# Patient Record
Sex: Male | Born: 1950 | Race: Black or African American | Hispanic: No | Smoking: Current every day smoker
Health system: Southern US, Community
[De-identification: ages and names within clinical notes are randomized; demographics above are authoritative.]

## PROBLEM LIST (undated history)

## (undated) DIAGNOSIS — Z046 Encounter for general psychiatric examination, requested by authority: Secondary | ICD-10-CM

## (undated) DIAGNOSIS — M1990 Unspecified osteoarthritis, unspecified site: Secondary | ICD-10-CM

## (undated) DIAGNOSIS — K5720 Diverticulitis of large intestine with perforation and abscess without bleeding: Secondary | ICD-10-CM

## (undated) DIAGNOSIS — E291 Testicular hypofunction: Secondary | ICD-10-CM

## (undated) DIAGNOSIS — T50905A Adverse effect of unspecified drugs, medicaments and biological substances, initial encounter: Secondary | ICD-10-CM

## (undated) DIAGNOSIS — D6959 Other secondary thrombocytopenia: Secondary | ICD-10-CM

## (undated) DIAGNOSIS — I10 Essential (primary) hypertension: Secondary | ICD-10-CM

## (undated) DIAGNOSIS — F3181 Bipolar II disorder: Secondary | ICD-10-CM

## (undated) DIAGNOSIS — K922 Gastrointestinal hemorrhage, unspecified: Secondary | ICD-10-CM

## (undated) DIAGNOSIS — E119 Type 2 diabetes mellitus without complications: Secondary | ICD-10-CM

## (undated) DIAGNOSIS — D126 Benign neoplasm of colon, unspecified: Secondary | ICD-10-CM

## (undated) DIAGNOSIS — E785 Hyperlipidemia, unspecified: Secondary | ICD-10-CM

## (undated) HISTORY — DX: Testicular hypofunction: E29.1

## (undated) HISTORY — DX: Unspecified osteoarthritis, unspecified site: M19.90

## (undated) HISTORY — PX: COLONOSCOPY: SHX174

---

## 1998-08-29 ENCOUNTER — Emergency Department (HOSPITAL_COMMUNITY): Admission: EM | Admit: 1998-08-29 | Discharge: 1998-08-29 | Payer: Self-pay | Admitting: Emergency Medicine

## 1998-08-30 ENCOUNTER — Encounter: Payer: Self-pay | Admitting: Emergency Medicine

## 2000-10-06 ENCOUNTER — Emergency Department (HOSPITAL_COMMUNITY): Admission: EM | Admit: 2000-10-06 | Discharge: 2000-10-06 | Payer: Self-pay | Admitting: Emergency Medicine

## 2000-10-06 ENCOUNTER — Encounter: Payer: Self-pay | Admitting: Emergency Medicine

## 2002-03-06 ENCOUNTER — Encounter: Payer: Self-pay | Admitting: Emergency Medicine

## 2002-03-06 ENCOUNTER — Emergency Department (HOSPITAL_COMMUNITY): Admission: EM | Admit: 2002-03-06 | Discharge: 2002-03-06 | Payer: Self-pay | Admitting: Emergency Medicine

## 2003-01-27 ENCOUNTER — Emergency Department (HOSPITAL_COMMUNITY): Admission: EM | Admit: 2003-01-27 | Discharge: 2003-01-27 | Payer: Self-pay | Admitting: Emergency Medicine

## 2003-08-21 DIAGNOSIS — F3181 Bipolar II disorder: Secondary | ICD-10-CM

## 2003-08-21 HISTORY — DX: Bipolar II disorder: F31.81

## 2003-10-03 ENCOUNTER — Emergency Department (HOSPITAL_COMMUNITY): Admission: EM | Admit: 2003-10-03 | Discharge: 2003-10-03 | Payer: Self-pay | Admitting: Emergency Medicine

## 2004-06-06 ENCOUNTER — Emergency Department (HOSPITAL_COMMUNITY): Admission: EM | Admit: 2004-06-06 | Discharge: 2004-06-06 | Payer: Self-pay | Admitting: Emergency Medicine

## 2004-06-22 ENCOUNTER — Ambulatory Visit: Payer: Self-pay | Admitting: Internal Medicine

## 2004-06-22 ENCOUNTER — Inpatient Hospital Stay (HOSPITAL_COMMUNITY): Admission: EM | Admit: 2004-06-22 | Discharge: 2004-06-26 | Payer: Self-pay | Admitting: Emergency Medicine

## 2004-06-23 ENCOUNTER — Encounter: Payer: Self-pay | Admitting: Internal Medicine

## 2004-08-03 ENCOUNTER — Ambulatory Visit: Payer: Self-pay | Admitting: *Deleted

## 2004-08-03 ENCOUNTER — Ambulatory Visit: Payer: Self-pay | Admitting: Family Medicine

## 2004-08-29 ENCOUNTER — Ambulatory Visit: Payer: Self-pay | Admitting: Family Medicine

## 2004-09-20 ENCOUNTER — Ambulatory Visit: Payer: Self-pay | Admitting: Family Medicine

## 2005-04-09 ENCOUNTER — Ambulatory Visit: Payer: Self-pay | Admitting: Family Medicine

## 2005-06-21 ENCOUNTER — Emergency Department (HOSPITAL_COMMUNITY): Admission: EM | Admit: 2005-06-21 | Discharge: 2005-06-21 | Payer: Self-pay | Admitting: *Deleted

## 2005-06-30 ENCOUNTER — Emergency Department (HOSPITAL_COMMUNITY): Admission: EM | Admit: 2005-06-30 | Discharge: 2005-06-30 | Payer: Self-pay | Admitting: Emergency Medicine

## 2006-04-01 ENCOUNTER — Ambulatory Visit: Payer: Self-pay | Admitting: Family Medicine

## 2006-04-29 ENCOUNTER — Emergency Department (HOSPITAL_COMMUNITY): Admission: EM | Admit: 2006-04-29 | Discharge: 2006-04-29 | Payer: Self-pay | Admitting: Emergency Medicine

## 2006-05-16 ENCOUNTER — Ambulatory Visit: Payer: Self-pay | Admitting: Family Medicine

## 2006-07-16 ENCOUNTER — Ambulatory Visit: Payer: Self-pay | Admitting: Family Medicine

## 2006-11-19 ENCOUNTER — Ambulatory Visit: Payer: Self-pay | Admitting: Family Medicine

## 2006-12-18 ENCOUNTER — Ambulatory Visit: Payer: Self-pay | Admitting: Family Medicine

## 2007-02-26 ENCOUNTER — Ambulatory Visit: Payer: Self-pay | Admitting: Internal Medicine

## 2007-08-09 ENCOUNTER — Emergency Department (HOSPITAL_COMMUNITY): Admission: EM | Admit: 2007-08-09 | Discharge: 2007-08-09 | Payer: Self-pay | Admitting: Emergency Medicine

## 2007-10-13 ENCOUNTER — Emergency Department (HOSPITAL_COMMUNITY): Admission: EM | Admit: 2007-10-13 | Discharge: 2007-10-13 | Payer: Self-pay | Admitting: Emergency Medicine

## 2008-01-05 ENCOUNTER — Encounter (INDEPENDENT_AMBULATORY_CARE_PROVIDER_SITE_OTHER): Payer: Self-pay | Admitting: Family Medicine

## 2008-01-05 ENCOUNTER — Ambulatory Visit: Payer: Self-pay | Admitting: Internal Medicine

## 2008-01-05 LAB — CONVERTED CEMR LAB
ALT: 20 units/L (ref 0–53)
AST: 11 units/L (ref 0–37)
Albumin: 4.4 g/dL (ref 3.5–5.2)
Alkaline Phosphatase: 46 units/L (ref 39–117)
BUN: 11 mg/dL (ref 6–23)
CO2: 23 meq/L (ref 19–32)
Calcium: 9.1 mg/dL (ref 8.4–10.5)
Chloride: 105 meq/L (ref 96–112)
Cholesterol: 204 mg/dL — ABNORMAL HIGH (ref 0–200)
Creatinine, Ser: 0.92 mg/dL (ref 0.40–1.50)
Glucose, Bld: 116 mg/dL — ABNORMAL HIGH (ref 70–99)
HDL: 37 mg/dL — ABNORMAL LOW (ref >39)
LDL Cholesterol: 130 mg/dL — ABNORMAL HIGH (ref 0–99)
Potassium: 3.9 meq/L (ref 3.5–5.3)
Sodium: 141 meq/L (ref 135–145)
Total Bilirubin: 0.3 mg/dL (ref 0.3–1.2)
Total CHOL/HDL Ratio: 5.5
Total Protein: 8.2 g/dL (ref 6.0–8.3)
Triglycerides: 186 mg/dL — ABNORMAL HIGH (ref <150)
VLDL: 37 mg/dL (ref 0–40)

## 2008-01-20 ENCOUNTER — Encounter: Payer: Self-pay | Admitting: Internal Medicine

## 2008-02-10 ENCOUNTER — Ambulatory Visit: Payer: Self-pay | Admitting: Internal Medicine

## 2008-02-12 ENCOUNTER — Ambulatory Visit: Payer: Self-pay | Admitting: Internal Medicine

## 2008-02-26 ENCOUNTER — Ambulatory Visit: Payer: Self-pay | Admitting: Internal Medicine

## 2008-02-26 ENCOUNTER — Encounter: Payer: Self-pay | Admitting: Internal Medicine

## 2008-03-01 ENCOUNTER — Encounter: Payer: Self-pay | Admitting: Internal Medicine

## 2008-04-29 ENCOUNTER — Emergency Department (HOSPITAL_COMMUNITY): Admission: EM | Admit: 2008-04-29 | Discharge: 2008-04-29 | Payer: Self-pay | Admitting: Emergency Medicine

## 2009-01-17 ENCOUNTER — Emergency Department (HOSPITAL_COMMUNITY): Admission: EM | Admit: 2009-01-17 | Discharge: 2009-01-17 | Payer: Self-pay | Admitting: Emergency Medicine

## 2009-02-15 ENCOUNTER — Encounter: Payer: Self-pay | Admitting: Family Medicine

## 2009-02-15 ENCOUNTER — Encounter (INDEPENDENT_AMBULATORY_CARE_PROVIDER_SITE_OTHER): Payer: Self-pay | Admitting: Adult Health

## 2009-02-15 ENCOUNTER — Ambulatory Visit: Payer: Self-pay | Admitting: Internal Medicine

## 2009-02-15 LAB — CONVERTED CEMR LAB
ALT: 9 units/L (ref 0–53)
AST: 6 units/L (ref 0–37)
Albumin: 4.1 g/dL (ref 3.5–5.2)
Alkaline Phosphatase: 41 units/L (ref 39–117)
BUN: 8 mg/dL (ref 6–23)
Basophils Absolute: 0 10*3/uL (ref 0.0–0.1)
Basophils Relative: 0 % (ref 0–1)
CO2: 21 meq/L (ref 19–32)
Calcium: 8.9 mg/dL (ref 8.4–10.5)
Chloride: 106 meq/L (ref 96–112)
Cholesterol: 163 mg/dL (ref 0–200)
Creatinine, Ser: 0.88 mg/dL (ref 0.40–1.50)
Eosinophils Absolute: 0.1 10*3/uL (ref 0.0–0.7)
Eosinophils Relative: 1 % (ref 0–5)
Glucose, Bld: 84 mg/dL (ref 70–99)
HCT: 39.2 % (ref 39.0–52.0)
HDL: 36 mg/dL — ABNORMAL LOW (ref >39)
Hemoglobin: 12.9 g/dL — ABNORMAL LOW (ref 13.0–17.0)
Hgb A1c MFr Bld: 5.8 % (ref 4.6–6.1)
LDL Cholesterol: 105 mg/dL — ABNORMAL HIGH (ref 0–99)
Lymphocytes Relative: 51 % — ABNORMAL HIGH (ref 12–46)
Lymphs Abs: 3.4 10*3/uL (ref 0.7–4.0)
MCHC: 32.9 g/dL (ref 30.0–36.0)
MCV: 88.3 fL (ref 78.0–100.0)
Microalb, Ur: 2.58 mg/dL — ABNORMAL HIGH (ref 0.00–1.89)
Monocytes Absolute: 0.8 10*3/uL (ref 0.1–1.0)
Monocytes Relative: 12 % (ref 3–12)
Neutro Abs: 2.3 10*3/uL (ref 1.7–7.7)
Neutrophils Relative %: 35 % — ABNORMAL LOW (ref 43–77)
PSA: 2.39 ng/mL (ref 0.10–4.00)
Platelets: 151 10*3/uL (ref 150–400)
Potassium: 4 meq/L (ref 3.5–5.3)
RBC: 4.44 M/uL (ref 4.22–5.81)
RDW: 14.9 % (ref 11.5–15.5)
Sodium: 140 meq/L (ref 135–145)
Total Bilirubin: 0.4 mg/dL (ref 0.3–1.2)
Total CHOL/HDL Ratio: 4.5
Total Protein: 7.5 g/dL (ref 6.0–8.3)
Triglycerides: 109 mg/dL (ref <150)
VLDL: 22 mg/dL (ref 0–40)
Valproic Acid Lvl: 83.8 ug/mL (ref 50.0–100.0)
WBC: 6.6 10*3/uL (ref 4.0–10.5)

## 2009-03-21 ENCOUNTER — Ambulatory Visit: Payer: Self-pay | Admitting: Internal Medicine

## 2009-06-01 ENCOUNTER — Ambulatory Visit: Payer: Self-pay | Admitting: Internal Medicine

## 2009-08-05 ENCOUNTER — Emergency Department (HOSPITAL_COMMUNITY): Admission: EM | Admit: 2009-08-05 | Discharge: 2009-08-05 | Payer: Self-pay | Admitting: Emergency Medicine

## 2009-08-09 ENCOUNTER — Encounter (INDEPENDENT_AMBULATORY_CARE_PROVIDER_SITE_OTHER): Payer: Self-pay | Admitting: Adult Health

## 2009-08-09 ENCOUNTER — Ambulatory Visit: Payer: Self-pay | Admitting: Internal Medicine

## 2009-08-09 LAB — CONVERTED CEMR LAB
ALT: 13 units/L (ref 0–53)
AST: 8 units/L (ref 0–37)
Albumin: 3.8 g/dL (ref 3.5–5.2)
Alkaline Phosphatase: 41 units/L (ref 39–117)
BUN: 11 mg/dL (ref 6–23)
CO2: 20 meq/L (ref 19–32)
Calcium: 8.8 mg/dL (ref 8.4–10.5)
Chloride: 105 meq/L (ref 96–112)
Cholesterol: 121 mg/dL (ref 0–200)
Creatinine, Ser: 1.03 mg/dL (ref 0.40–1.50)
Glucose, Bld: 106 mg/dL — ABNORMAL HIGH (ref 70–99)
HDL: 25 mg/dL — ABNORMAL LOW (ref >39)
LDL Cholesterol: 74 mg/dL (ref 0–99)
Potassium: 3.9 meq/L (ref 3.5–5.3)
Sodium: 140 meq/L (ref 135–145)
Total Bilirubin: 0.3 mg/dL (ref 0.3–1.2)
Total CHOL/HDL Ratio: 4.8
Total Protein: 7.6 g/dL (ref 6.0–8.3)
Triglycerides: 108 mg/dL (ref <150)
VLDL: 22 mg/dL (ref 0–40)

## 2009-09-20 ENCOUNTER — Encounter (INDEPENDENT_AMBULATORY_CARE_PROVIDER_SITE_OTHER): Payer: Self-pay | Admitting: Adult Health

## 2009-09-20 ENCOUNTER — Ambulatory Visit: Payer: Self-pay | Admitting: Internal Medicine

## 2009-09-20 LAB — CONVERTED CEMR LAB: Hgb A1c MFr Bld: 6.1 % (ref 4.6–6.1)

## 2009-09-26 ENCOUNTER — Emergency Department (HOSPITAL_COMMUNITY): Admission: EM | Admit: 2009-09-26 | Discharge: 2009-09-26 | Payer: Self-pay | Admitting: Emergency Medicine

## 2010-01-06 ENCOUNTER — Ambulatory Visit: Payer: Self-pay | Admitting: Internal Medicine

## 2010-01-06 ENCOUNTER — Encounter (INDEPENDENT_AMBULATORY_CARE_PROVIDER_SITE_OTHER): Payer: Self-pay | Admitting: Adult Health

## 2010-01-06 LAB — CONVERTED CEMR LAB
ALT: 9 units/L (ref 0–53)
AST: 6 units/L (ref 0–37)
Albumin: 4.4 g/dL (ref 3.5–5.2)
Alkaline Phosphatase: 41 units/L (ref 39–117)
BUN: 10 mg/dL (ref 6–23)
Basophils Absolute: 0 10*3/uL (ref 0.0–0.1)
Basophils Relative: 0 % (ref 0–1)
CO2: 22 meq/L (ref 19–32)
Calcium: 9.7 mg/dL (ref 8.4–10.5)
Chloride: 109 meq/L (ref 96–112)
Cholesterol: 167 mg/dL (ref 0–200)
Creatinine, Ser: 0.94 mg/dL (ref 0.40–1.50)
Eosinophils Absolute: 0.1 10*3/uL (ref 0.0–0.7)
Eosinophils Relative: 1 % (ref 0–5)
Glucose, Bld: 98 mg/dL (ref 70–99)
HCT: 39.9 % (ref 39.0–52.0)
HDL: 36 mg/dL — ABNORMAL LOW (ref >39)
Hemoglobin: 13 g/dL (ref 13.0–17.0)
LDL Cholesterol: 111 mg/dL — ABNORMAL HIGH (ref 0–99)
Lymphocytes Relative: 60 % — ABNORMAL HIGH (ref 12–46)
Lymphs Abs: 3.8 10*3/uL (ref 0.7–4.0)
MCHC: 32.6 g/dL (ref 30.0–36.0)
MCV: 86.2 fL (ref 78.0–100.0)
Microalb, Ur: 2.49 mg/dL — ABNORMAL HIGH (ref 0.00–1.89)
Monocytes Absolute: 0.7 10*3/uL (ref 0.1–1.0)
Monocytes Relative: 10 % (ref 3–12)
Neutro Abs: 1.9 10*3/uL (ref 1.7–7.7)
Neutrophils Relative %: 29 % — ABNORMAL LOW (ref 43–77)
Platelets: 144 10*3/uL — ABNORMAL LOW (ref 150–400)
Potassium: 4.4 meq/L (ref 3.5–5.3)
RBC: 4.63 M/uL (ref 4.22–5.81)
RDW: 14.8 % (ref 11.5–15.5)
Sodium: 140 meq/L (ref 135–145)
Total Bilirubin: 0.3 mg/dL (ref 0.3–1.2)
Total CHOL/HDL Ratio: 4.6
Total Protein: 8 g/dL (ref 6.0–8.3)
Triglycerides: 102 mg/dL (ref <150)
VLDL: 20 mg/dL (ref 0–40)
Valproic Acid Lvl: 81.2 ug/mL (ref 50.0–100.0)
WBC: 6.4 10*3/uL (ref 4.0–10.5)

## 2010-01-19 ENCOUNTER — Ambulatory Visit: Payer: Self-pay | Admitting: Internal Medicine

## 2010-07-03 ENCOUNTER — Encounter (INDEPENDENT_AMBULATORY_CARE_PROVIDER_SITE_OTHER): Payer: Self-pay | Admitting: *Deleted

## 2010-07-03 LAB — CONVERTED CEMR LAB
ALT: 9 units/L (ref 0–53)
AST: 5 units/L (ref 0–37)
Albumin: 4.4 g/dL (ref 3.5–5.2)
Alkaline Phosphatase: 42 units/L (ref 39–117)
BUN: 15 mg/dL (ref 6–23)
CO2: 23 meq/L (ref 19–32)
Calcium: 9 mg/dL (ref 8.4–10.5)
Chloride: 104 meq/L (ref 96–112)
Creatinine, Ser: 1 mg/dL (ref 0.40–1.50)
Glucose, Bld: 103 mg/dL — ABNORMAL HIGH (ref 70–99)
Potassium: 4.2 meq/L (ref 3.5–5.3)
Sodium: 139 meq/L (ref 135–145)
Total Bilirubin: 0.2 mg/dL — ABNORMAL LOW (ref 0.3–1.2)
Total Protein: 8 g/dL (ref 6.0–8.3)

## 2010-08-11 ENCOUNTER — Emergency Department (HOSPITAL_COMMUNITY)
Admission: EM | Admit: 2010-08-11 | Discharge: 2010-08-11 | Payer: Self-pay | Source: Home / Self Care | Admitting: Emergency Medicine

## 2010-11-20 LAB — URINALYSIS, ROUTINE W REFLEX MICROSCOPIC
Glucose, UA: NEGATIVE mg/dL
Hgb urine dipstick: NEGATIVE
Ketones, ur: 15 mg/dL — AB
Nitrite: POSITIVE — AB
Protein, ur: 30 mg/dL — AB
Specific Gravity, Urine: 1.026 (ref 1.005–1.030)
Urobilinogen, UA: 2 mg/dL — ABNORMAL HIGH (ref 0.0–1.0)
pH: 6 (ref 5.0–8.0)

## 2010-11-20 LAB — URINE CULTURE: Colony Count: >=100000

## 2010-11-20 LAB — URINE MICROSCOPIC-ADD ON

## 2011-01-05 NOTE — Discharge Summary (Signed)
Bradley [name], MILLEY NO.:  [id number]   MEDICAL RECORD NO.:  [id number]          PATIENT TYPE:  INP   LOCATION:  4706                         FACILITY:  [facility]   PHYSICIAN:  Artist Beach, MD        DATE OF BIRTH:  [birth year]   DATE OF ADMISSION:  06/22/2004  DATE OF DISCHARGE:  06/26/2004                                 DISCHARGE SUMMARY   DISCHARGE DIAGNOSES:  1.  Idiopathic syncope.  2.  Hypertension.  3.  Schizophrenia.  4.  Bipolar disorder.   DISCHARGE MEDICATIONS:  1.  Lotensin 20 mg b.i.d.  2.  Hydrochlorothiazide 25 mg daily.  3.  Cogentin 1 mg b.i.d.  4.  Risperdal 2 mg b.i.d.  5.  Depakote 500 mg daily.  6.  The patient is discharged on his home medications.   FOLLOW UP:  He is to follow up at [facility]  Also, the patient will  follow up with Dr. Marney Setting.   PROCEDURE PERFORMED:  1.  The patient got an EKG on June 22, 2004, which showed normal sinus      rhythm, large P-wave in lead II and V1, left atrial enlargement, ST      changes in II, III, aVF and V4, V5 and 6.  The lateral lead changes were      unchanged from previous EKG from February 2003, but inferior lead      changes were new.  Tall QRS in lead V showed left ventricular      hypertrophy.  2.  The patient got a 2-D echo on June 23, 2004, showing left atrial      enlargement, ejection fraction of 55%, rest normal.  3.  The patient got a Cardiolite on June 25, 2004, which was negative for      ST changes, negative for dysrhythmia.   CONSULTATIONS:  The patient did not get any consults.   Bradley [name] is a 60 year old African American male patient who was brought  to the ED via EMS after passing out on the morning of admission.  The  patient was sitting in court in the morning when he experienced some  dizziness.  He stood up to walk out of the court where he was to call off  some case that he was there for.  He felt sweaty and clammy at that time,  did not remember  passing out.  The only thing he remembered was when he  opened his eyes and was surrounded by police officers.  He said he was out  for less than three minutes, does not remember experiencing any chest pain,  shortness of breath, visual disturbances, ringing in the ears.   LABORATORY DATA ON ADMISSION:  Sodium 137, potassium 3.9, chloride 106,  bicarb 25, BUN 15, creatinine 1.0, glucose 93.  Alcohol level less than 5.  Valproic acid level was 69.  Urine drug screen was negative.  TSH level was  0.707.  Three sets of cardiac enzymes were negative.  Hemoglobin 16,  hematocrit 47.  Normal liver function tests.   HOSPITAL COURSE:  PROBLEM #1 -  SYNCOPE:  The patient has had similar  episode two years ago.  He was diagnosed with cardiogenic syncope at that  time but no work-up was done.  During this admission, the differential  diagnosis was cardiogenic versus orthostatic versus hypoglycemic syncope.  The patient's glucose on admission was 93 and by EMS at the site of passing  out, was 144, so hypoglycemia was ruled out.   PROBLEM #2 -  ORTHOSTATIC HYPERTENSION:  The patient is on a couple of  medications which would have caused hypertension also.  His medicines were  recently changed.  He is on hydrochlorothiazide and Lotensin for his  hypertension plus his Lithium was changed and he was put on valproic acid  and he has been taking Respirol which is known rarely to cause dizziness and  orthostatic hypertension but on admission,  Orthostatics were lying down  138/77 with pulse 60, sitting up 131/71 with pulse 61, and standing 128/75  with pulse 71.  The patient was not orthostatic.  From the EKG, it was found  that the patient had lateral and inferior lead ischemic changes so this was  investigated further with echo and Cardiolite as mentioned reports earlier.  No significant cause was found so on discharge, this was labeled as  idiopathic syncope and  discharged on same medication.    PROBLEM #3 -  HYPERTENSION:  The patient is taking Lotensin 20 mg and  hydrochlorothiazide 25 mg daily.   PROBLEM #4 -  SCHIZOPHRENIA/BIPOLAR DISORDER:  The patient is on Cogentin 1  mg b.i.d., Risperdol 2 mg b.i.d. and __________ 500 mg daily.  He is  followed up at [facility] and by Dr. Marney Setting.  Further he is also a patient  at [facility].   There were no labs on discharge.   PHYSICAL EXAMINATION:  GENERAL APPEARANCE:  Fairly built [place] American  male patient in no acute distress.  No facial droop, no slurred speech.  VITAL SIGNS:  On admission, temperature 97.9, pulse 62, respiratory rate 18,  blood pressure 133/73, saturating 94% on room air.  HEENT:  Pupils round and reactive to light.  Extraocular movements intact.  ENT examination normal tympanic membranes, no thyromegaly, no JVD, no bruit.  NECK:  Supple.  LUNGS:  Air entry bilaterally and equal.  No rales or rhonchi.  CARDIOVASCULAR:  Regular rate and rhythm with no murmurs, rubs, or gallops.  ABDOMEN:  Soft and nontender, nondistended with normal bowel sounds.  NEUROLOGIC:  Grossly nonfocal.  PSYCHIATRIC:  No delusions, hallucinations, suicidal ideation, oriented x3.       SP/MEDQ  D:  06/26/2004  T:  06/26/2004  Job:  [id number]   cc:   [address]ad[address]   Dr. Marney Setting,

## 2011-01-05 NOTE — Discharge Summary (Signed)
NAMEDIEM, PAGNOTTA NO.:  [id number]   MEDICAL RECORD NO.:  [id number]          PATIENT TYPE:  INP   LOCATION:  4706                         FACILITY:  [facility]   PHYSICIAN:  Artist Beach, MD        DATE OF BIRTH:  1951-02-21   DATE OF ADMISSION:  06/22/2004  DATE OF DISCHARGE:  06/26/2004                                 DISCHARGE SUMMARY   ADDENDUM:  Mr. Phoebe Sharps had Cardiolite on November 6 which initially was  negative for ST changes, but the films were pending.  When the films were  done it showed abnormal EKG for ischemia.  The patient is asked to follow up  at [facility] for further management.  I will be sending a letter to  Mr. Ikegwouno.  I have tried calling up at the number, but it seems the  patient does not live there.  We will send a copy of this letter to his  place.       SP/MEDQ  D:  07/16/2004  T:  07/16/2004  Job:  [id number]

## 2011-08-23 DIAGNOSIS — E119 Type 2 diabetes mellitus without complications: Secondary | ICD-10-CM | POA: Insufficient documentation

## 2011-10-20 ENCOUNTER — Encounter (HOSPITAL_COMMUNITY): Payer: Self-pay | Admitting: Emergency Medicine

## 2011-10-20 ENCOUNTER — Emergency Department (HOSPITAL_COMMUNITY)
Admission: EM | Admit: 2011-10-20 | Discharge: 2011-10-21 | Disposition: A | Discharge status: Home / Self Care | Payer: Medicare Other | Attending: Emergency Medicine | Admitting: Emergency Medicine

## 2011-10-20 DIAGNOSIS — F319 Bipolar disorder, unspecified: Secondary | ICD-10-CM | POA: Insufficient documentation

## 2011-10-20 DIAGNOSIS — F3181 Bipolar II disorder: Secondary | ICD-10-CM | POA: Insufficient documentation

## 2011-10-20 DIAGNOSIS — K0889 Other specified disorders of teeth and supporting structures: Secondary | ICD-10-CM

## 2011-10-20 DIAGNOSIS — K089 Disorder of teeth and supporting structures, unspecified: Secondary | ICD-10-CM | POA: Insufficient documentation

## 2011-10-20 DIAGNOSIS — E785 Hyperlipidemia, unspecified: Secondary | ICD-10-CM | POA: Insufficient documentation

## 2011-10-20 DIAGNOSIS — I10 Essential (primary) hypertension: Secondary | ICD-10-CM | POA: Insufficient documentation

## 2011-10-20 HISTORY — DX: Hyperlipidemia, unspecified: E78.5

## 2011-10-20 HISTORY — DX: Bipolar II disorder: F31.81

## 2011-10-20 HISTORY — DX: Essential (primary) hypertension: I10

## 2011-10-20 NOTE — ED Notes (Signed)
Pt states toothache started today. Pt states that his top back tooth is hurting. Pt denies any swelling. Pt has a blacked upper tooth. Pt states painful to chew.

## 2011-10-20 NOTE — ED Notes (Signed)
Patient complaining of a toothache (right upper); patient states that his tooth cracked and part fell out last month.  Patient states that he needs medication to last him until he can get to a dentist.

## 2011-10-21 MED ORDER — HYDROCODONE-ACETAMINOPHEN 5-325 MG PO TABS: 1.0000 | ORAL_TABLET | Freq: Four times a day (QID) | ORAL | Status: AC | PRN

## 2011-10-21 MED ORDER — OXYCODONE-ACETAMINOPHEN 5-325 MG PO TABS
2.0000 | ORAL_TABLET | Freq: Once | ORAL | Status: AC
  Administered 2011-10-21: 2 via ORAL
  Filled 2011-10-21: qty 2

## 2011-10-21 NOTE — Discharge Instructions (Signed)
You have a dental injury. Use the resource guide listed below to help you find a dentist if you do not already have one to followup with. It is very important that you get evaluated by a dentist as soon as possible. Call tomorrow to schedule an appointment. Use your pain medication as prescribed and do not operate heavy machinery while on pain medication. Note that your pain medication contains acetaminophen (Tylenol) & its is not reccommended that you use additional acetaminophen (Tylenol) while taking this medication. Take your full course of antibiotics. Read the instructions below.  Eat a soft or liquid diet and rinse your mouth out after meals with warm water. You should see a dentist or return here at once if you have increased swelling, increased pain or uncontrolled bleeding from the site of your injury.   SEEK MEDICAL CARE IF:   You have increased pain not controlled with medicines.   You have swelling around your tooth, in your face or neck.   You have bleeding which starts, continues, or gets worse.   You have a fever >101  If you are unable to open your mouth  RESOURCE GUIDE  Dental Problems  Patients with Medicaid: [facility]                     [facility] [address]                                           [address] Phone:  [phone]                                                  Phone:  [phone]  If unable to pay or uninsured, contact:  [facility] or [facility] to become qualified for the adult dental clinic.  Chronic Pain Problems Contact [facility]  [phone] Patients need to be referred by their primary care doctor.  Insufficient Money for Medicine Contact [employer]:  call "211" or [facility] 271-5999.  No Primary Care Doctor Call [facility]  832-8000 Other agencies that provide inexpensive medical care    [facility]  832-8035    [place]  [facility]  832-7272    [facility]  [phone]    Women's Clinic  832-4777    [facility]  [phone]    [facility]  [phone]  Psychological Services [facility]  [phone] [facility]  [phone] [facility]   800 853-5163 (emergency services 641-4993)  Substance Abuse Resources [facility]  336-882-2125 [facility] 336-784-9470 [facility] 336-285-9073 [facility] 336-845-3988 Residential & Outpatient Substance Abuse Program  800-659-3381  Abuse/Neglect [place] Child Abuse Hotline (336) 641-3795 [place] Child Abuse Hotline 800-378-5315 (After Hours)  Emergency Shelter [facility] (336) 271-5985  [facility] (336) 275-9566 [facility] (704) 372-4663  MRSA Hotline #:   832-7006    [place] Resources  [facility] [place]     [employer]                            [facility] [address] [place]                       [address]      [address]  [place]                                                [place]                            [place] Phone:  [phone]                                   Phone:  [phone]                 Phone:  [phone]  [facility] Phone:  [phone]  [place] Child Abuse Hotline (336) 342-1394 (336) 342-3537 (After Hours)    

## 2011-10-21 NOTE — ED Provider Notes (Signed)
History     CSN: [id number]  Arrival date & time 10/20/11  2251   First MD Initiated Contact with Patient 10/20/11 2341      Chief Complaint  Patient presents with  . Dental Pain    (Consider location/radiation/quality/duration/timing/severity/associated sxs/prior treatment) HPI Comments: Patient presents to the emergency department with a dental complaint. Symptoms began yesterday. The patient has tried to alleviate pain with nothing.  Pain rated at a 10/10, characterized as throbbing in nature and located right upper mouth. Patient denies fever, night sweats, chills, difficulty swallowing or opening mouth, SOB, nuchal rigidity or decreased ROM of neck.  Patient does not have a dentist and requests a resource guide at discharge.   Patient is a 61 y.o. male presenting with tooth pain. The history is provided by the patient.  Dental PainPrimary symptoms do not include headaches, fever, shortness of breath or sore throat.  Additional symptoms do not include: facial swelling, trouble swallowing, drooling and ear pain.    Past Medical History  Diagnosis Date  . Hypertension   . Hyperlipemia   . Bipolar 2 disorder     History reviewed. No pertinent past surgical history.  History reviewed. No pertinent family history.  History  Substance Use Topics  . Smoking status: Current Everyday Smoker -- 1.0 packs/day  . Smokeless tobacco: Not on file  . Alcohol Use: No      Review of Systems  Constitutional: Negative for fever, chills, diaphoresis and activity change.  HENT: Positive for dental problem. Negative for ear pain, sore throat, facial swelling, drooling, mouth sores, trouble swallowing, neck pain, neck stiffness, voice change, sinus pressure and tinnitus.   Eyes: Negative for pain and visual disturbance.  Respiratory: Negative for shortness of breath, wheezing and stridor.   Cardiovascular: Negative for chest pain.  Gastrointestinal: Negative for nausea and abdominal pain.   Musculoskeletal: Negative for myalgias.  Skin: Negative for rash.  Neurological: Negative for speech difficulty and headaches.  Hematological: Negative for adenopathy.  All other systems reviewed and are negative.    Allergies  Review of patient's allergies indicates no known allergies.  Home Medications   Current Outpatient Rx  Name Route Sig Dispense Refill  . HYDROCODONE-ACETAMINOPHEN 5-325 MG PO TABS Oral Take 1 tablet by mouth every 6 (six) hours as needed for pain. 15 tablet 0    BP 173/95  Temp(Src) 98.7 F (37.1 C) (Oral)  Resp 18  SpO2 97%  Physical Exam  Nursing note and vitals reviewed. Constitutional: He is oriented to person, place, and time. He appears well-developed and well-nourished. No distress.  HENT:  Head: Normocephalic and atraumatic. No trismus in the jaw.  Mouth/Throat: Uvula is midline, oropharynx is clear and moist and mucous membranes are normal. Abnormal dentition. No dental abscesses or uvula swelling. No oropharyngeal exudate, posterior oropharyngeal edema, posterior oropharyngeal erythema or tonsillar abscesses.       Poor dental hygiene. Pt able to open and close mouth with out difficulty. Airway intact. Uvula midline. Mild gingival tenderness over affected area, but no fluctuance. No new dental trauma or exposed dentin or pulp. No swelling or tenderness of submental and submandibular regions.  Eyes: Conjunctivae and EOM are normal.  Neck: Normal range of motion and full passive range of motion without pain. Neck supple.  Cardiovascular: Normal rate and regular rhythm.   Pulmonary/Chest: Effort normal and breath sounds normal. No stridor. No respiratory distress. He has no wheezes.  Musculoskeletal: Normal range of motion.  Lymphadenopathy:  Head (right side): No submental, no submandibular, no tonsillar, no preauricular and no posterior auricular adenopathy present.       Head (left side): No submental, no submandibular, no tonsillar, no  preauricular and no posterior auricular adenopathy present.    He has no cervical adenopathy.  Neurological: He is alert and oriented to person, place, and time.  Skin: Skin is warm and dry. No rash noted. He is not diaphoretic.    ED Course  Procedures (including critical care time)  Labs Reviewed - No data to display No results found.   No diagnosis found.    MDM  Dental pain  Patient with toothache.  No gross abscess.  Exam unconcerning for Ludwig's angina or spread of infection.  Will treat with pain medicine.  Urged patient to follow-up with dentist.  Antibiotics Not indicated no evidence of infection or risk of infection.         Jaci Carrel, [place] 10/21/11 [phone]

## 2011-10-21 NOTE — ED Provider Notes (Signed)
Medical screening examination/treatment/procedure(s) were performed by non-physician practitioner and as supervising physician I was immediately available for consultation/collaboration.  Aseret Hoffman, MD 10/21/11 1637 

## 2011-11-13 ENCOUNTER — Emergency Department (HOSPITAL_COMMUNITY): Payer: Medicare Other

## 2011-11-13 ENCOUNTER — Encounter (HOSPITAL_COMMUNITY): Payer: Self-pay | Admitting: *Deleted

## 2011-11-13 ENCOUNTER — Emergency Department (HOSPITAL_COMMUNITY)
Admission: EM | Admit: 2011-11-13 | Discharge: 2011-11-13 | Disposition: A | Discharge status: Home / Self Care | Payer: Medicare Other | Attending: Emergency Medicine | Admitting: Emergency Medicine

## 2011-11-13 DIAGNOSIS — Z79899 Other long term (current) drug therapy: Secondary | ICD-10-CM | POA: Insufficient documentation

## 2011-11-13 DIAGNOSIS — N201 Calculus of ureter: Secondary | ICD-10-CM | POA: Insufficient documentation

## 2011-11-13 DIAGNOSIS — I10 Essential (primary) hypertension: Secondary | ICD-10-CM | POA: Insufficient documentation

## 2011-11-13 DIAGNOSIS — F3189 Other bipolar disorder: Secondary | ICD-10-CM | POA: Insufficient documentation

## 2011-11-13 DIAGNOSIS — N1330 Unspecified hydronephrosis: Secondary | ICD-10-CM | POA: Insufficient documentation

## 2011-11-13 DIAGNOSIS — N132 Hydronephrosis with renal and ureteral calculous obstruction: Secondary | ICD-10-CM

## 2011-11-13 DIAGNOSIS — E785 Hyperlipidemia, unspecified: Secondary | ICD-10-CM | POA: Insufficient documentation

## 2011-11-13 DIAGNOSIS — R1032 Left lower quadrant pain: Secondary | ICD-10-CM | POA: Insufficient documentation

## 2011-11-13 LAB — DIFFERENTIAL
Basophils Absolute: 0 10*3/uL (ref 0.0–0.1)
Basophils Relative: 0 % (ref 0–1)
Eosinophils Absolute: 0.1 10*3/uL (ref 0.0–0.7)
Eosinophils Relative: 1 % (ref 0–5)
Lymphocytes Relative: 35 % (ref 12–46)
Lymphs Abs: 2.8 10*3/uL (ref 0.7–4.0)
Monocytes Absolute: 0.7 10*3/uL (ref 0.1–1.0)
Monocytes Relative: 9 % (ref 3–12)
Neutro Abs: 4.4 10*3/uL (ref 1.7–7.7)
Neutrophils Relative %: 55 % (ref 43–77)

## 2011-11-13 LAB — CBC
HCT: 39.4 % (ref 39.0–52.0)
Hemoglobin: 13.3 g/dL (ref 13.0–17.0)
MCH: 28.5 pg (ref 26.0–34.0)
MCHC: 33.8 g/dL (ref 30.0–36.0)
MCV: 84.5 fL (ref 78.0–100.0)
Platelets: 175 10*3/uL (ref 150–400)
RBC: 4.66 MIL/uL (ref 4.22–5.81)
RDW: 14.5 % (ref 11.5–15.5)
WBC: 8.1 10*3/uL (ref 4.0–10.5)

## 2011-11-13 LAB — URINALYSIS, ROUTINE W REFLEX MICROSCOPIC
Bilirubin Urine: NEGATIVE
Glucose, UA: NEGATIVE mg/dL
Ketones, ur: NEGATIVE mg/dL
Nitrite: NEGATIVE
Protein, ur: NEGATIVE mg/dL
Specific Gravity, Urine: 1.019 (ref 1.005–1.030)
Urobilinogen, UA: 1 mg/dL (ref 0.0–1.0)
pH: 6.5 (ref 5.0–8.0)

## 2011-11-13 LAB — POCT I-STAT, CHEM 8
BUN: 13 mg/dL (ref 6–23)
Calcium, Ion: 1.17 mmol/L (ref 1.12–1.32)
Chloride: 104 mEq/L (ref 96–112)
Creatinine, Ser: 1.2 mg/dL (ref 0.50–1.35)
HCT: 42 % (ref 39.0–52.0)
Hemoglobin: 14.3 g/dL (ref 13.0–17.0)
Potassium: 3.8 mEq/L (ref 3.5–5.1)
Sodium: 141 mEq/L (ref 135–145)
TCO2: 25 mmol/L (ref 0–100)

## 2011-11-13 LAB — URINE MICROSCOPIC-ADD ON

## 2011-11-13 LAB — AMYLASE: Amylase: 129 U/L — ABNORMAL HIGH (ref 0–105)

## 2011-11-13 LAB — LIPASE, BLOOD: Lipase: 34 U/L (ref 11–59)

## 2011-11-13 MED ORDER — SODIUM CHLORIDE 0.9 % IV SOLN
Freq: Once | INTRAVENOUS | Status: AC
  Administered 2011-11-13: 04:00:00 via INTRAVENOUS

## 2011-11-13 MED ORDER — HYDROMORPHONE HCL PF 1 MG/ML IJ SOLN
1.0000 mg | Freq: Once | INTRAMUSCULAR | Status: AC
  Administered 2011-11-13: 1 mg via INTRAVENOUS
  Filled 2011-11-13: qty 1

## 2011-11-13 MED ORDER — ONDANSETRON HCL 4 MG PO TABS: 4.0000 mg | ORAL_TABLET | Freq: Three times a day (TID) | ORAL | Status: AC | PRN

## 2011-11-13 MED ORDER — ONDANSETRON HCL 4 MG/2ML IJ SOLN
INTRAMUSCULAR | Status: AC
  Administered 2011-11-13: 4 mg via INTRAVENOUS
  Filled 2011-11-13: qty 2

## 2011-11-13 MED ORDER — TAMSULOSIN HCL 0.4 MG PO CAPS: 0.4000 mg | ORAL_CAPSULE | Freq: Every day | ORAL | Status: DC

## 2011-11-13 MED ORDER — ONDANSETRON HCL 4 MG/2ML IJ SOLN
4.0000 mg | Freq: Once | INTRAMUSCULAR | Status: AC
  Administered 2011-11-13: 4 mg via INTRAVENOUS

## 2011-11-13 MED ORDER — IOHEXOL 300 MG/ML  SOLN
100.0000 mL | Freq: Once | INTRAMUSCULAR | Status: AC | PRN
  Administered 2011-11-13: 100 mL via INTRAVENOUS

## 2011-11-13 MED ORDER — HYDROCODONE-ACETAMINOPHEN 5-325 MG PO TABS: ORAL_TABLET | ORAL | Status: DC

## 2011-11-13 NOTE — ED Notes (Signed)
rx x 3, pt voiced understanding to f/u with PCP and urologist.  D/c home in cab

## 2011-11-13 NOTE — ED Notes (Signed)
Pt vomited after dilaudid administration, zofran given.  Waiting for pain/nausea to pass before discharge.

## 2011-11-13 NOTE — ED Notes (Signed)
Pt c/o sudden onset of left sided abdominal pain at midnight tonight.  Nausea, no vomiting/diarrhea.

## 2011-11-13 NOTE — Discharge Instructions (Signed)
Kidney Stones Kidney stones (ureteral lithiasis) are deposits that form inside your kidneys. The intense pain is caused by the stone moving through the urinary tract. When the stone moves, the ureter goes into spasm around the stone. The stone is usually passed in the urine.  CAUSES   A disorder that makes certain neck glands produce too much parathyroid hormone (primary hyperparathyroidism).   A buildup of uric acid crystals.   Narrowing (stricture) of the ureter.   A kidney obstruction present at birth (congenital obstruction).   Previous surgery on the kidney or ureters.   Numerous kidney infections.  SYMPTOMS   Feeling sick to your stomach (nauseous).   Throwing up (vomiting).   Blood in the urine (hematuria).   Pain that usually spreads (radiates) to the groin.   Frequency or urgency of urination.  DIAGNOSIS   Taking a history and physical exam.   Blood or urine tests.   Computerized X-ray scan (CT scan).   Occasionally, an examination of the inside of the urinary bladder (cystoscopy) is performed.  TREATMENT   Observation.   Increasing your fluid intake.   Surgery may be needed if you have severe pain or persistent obstruction.  The size, location, and chemical composition are all important variables that will determine the proper choice of action for you. Talk to your caregiver to better understand your situation so that you will minimize the risk of injury to yourself and your kidney.  HOME CARE INSTRUCTIONS   Drink enough water and fluids to keep your urine clear or pale yellow.   Strain all urine through the provided strainer. Keep all particulate matter and stones for your caregiver to see. The stone causing the pain may be as small as a grain of salt. It is very important to use the strainer each and every time you pass your urine. The collection of your stone will allow your caregiver to analyze it and verify that a stone has actually passed.   Only take  over-the-counter or prescription medicines for pain, discomfort, or fever as directed by your caregiver.   Make a follow-up appointment with your caregiver as directed.   Get follow-up X-rays if required. The absence of pain does not always mean that the stone has passed. It may have only stopped moving. If the urine remains completely obstructed, it can cause loss of kidney function or even complete destruction of the kidney. It is your responsibility to make sure X-rays and follow-ups are completed. Ultrasounds of the kidney can show blockages and the status of the kidney. Ultrasounds are not associated with any radiation and can be performed easily in a matter of minutes.  SEEK IMMEDIATE MEDICAL CARE IF:   Pain cannot be controlled with the prescribed medicine.   You have a fever.   The severity or intensity of pain increases over 18 hours and is not relieved by pain medicine.   You develop a new onset of abdominal pain.   You feel faint or pass out.  MAKE SURE YOU:   Understand these instructions.   Will watch your condition.   Will get help right away if you are not doing well or get worse.  Document Released: 08/06/2005 Document Revised: 07/26/2011 Document Reviewed: 12/02/2009 [facility] Patient Information 2012 [place], [place].    Narcotic and benzodiazepine use may cause drowsiness, slowed breathing or dependence.  Please use with caution and do not drive, operate machinery or watch young children alone while taking them.  Taking combinations of these  medications or drinking alcohol will potentiate these effects.

## 2011-11-13 NOTE — ED Notes (Signed)
CT notified that pt finished drinking contrast, will scan in 15-20 min

## 2011-11-13 NOTE — ED Provider Notes (Signed)
History     CSN: [id number]  Arrival date & time 11/13/11  0145   First MD Initiated Contact with Patient 11/13/11 [phone]      Chief Complaint  Patient presents with  . Abdominal Pain    (Consider location/radiation/quality/duration/timing/severity/associated sxs/prior treatment) HPI Comments: Pt reports about 12 hours ago developed abd pain in LLQ, no N/V/D.  No prior surgeries.  He tried pepto bismol with no improvement.  Denies dysuria, urgency frequency.  No prior h/o similar symptoms.  Doesn't radiate.  No back pain.  Denies trauma.    Patient is a 61 y.o. male presenting with abdominal pain. The history is provided by the patient.  Abdominal Pain The primary symptoms of the illness include abdominal pain. The primary symptoms of the illness do not include fever, nausea, vomiting or diarrhea.  Symptoms associated with the illness do not include chills, urgency or frequency.    Past Medical History  Diagnosis Date  . Hypertension   . Hyperlipemia   . Bipolar 2 disorder     History reviewed. No pertinent past surgical history.  History reviewed. No pertinent family history.  History  Substance Use Topics  . Smoking status: Current Everyday Smoker -- 1.0 packs/day  . Smokeless tobacco: Not on file  . Alcohol Use: No      Review of Systems  Constitutional: Negative for fever, chills and appetite change.  Gastrointestinal: Positive for abdominal pain. Negative for nausea, vomiting, diarrhea and blood in stool.  Genitourinary: Negative for urgency, frequency and flank pain.  Skin: Negative for rash.  All other systems reviewed and are negative.    Allergies  Review of patient's allergies indicates no known allergies.  Home Medications   Current Outpatient Rx  Name Route Sig Dispense Refill  . HYDROCODONE-ACETAMINOPHEN 5-325 MG PO TABS  1-2 tablets po q 6 hours prn moderate to severe pain 20 tablet 0  . ONDANSETRON HCL 4 MG PO TABS Oral Take 1 tablet (4 mg  total) by mouth every 8 (eight) hours as needed for nausea. 12 tablet 0  . TAMSULOSIN HCL 0.4 MG PO CAPS Oral Take 1 capsule (0.4 mg total) by mouth daily. 14 capsule 0    BP 142/73  Pulse 69  Temp 98 F (36.7 C)  Resp 20  SpO2 94%  Physical Exam  Nursing note and vitals reviewed. Constitutional: He is oriented to person, place, and time. He appears well-developed and well-nourished.  HENT:  Head: Normocephalic.  Eyes: Pupils are equal, round, and reactive to light.  Neck: Normal range of motion. Neck supple.  Pulmonary/Chest: No respiratory distress. He has no wheezes. He has no rales.  Abdominal: Soft. Normal appearance and bowel sounds are normal. He exhibits no distension. There is tenderness in the left lower quadrant. There is no rigidity, no rebound, no guarding, no CVA tenderness, no tenderness at McBurney's point and negative Murphy's sign.  Musculoskeletal: He exhibits no edema.  Neurological: He is alert and oriented to person, place, and time.  Skin: Skin is warm and dry.    ED Course  Procedures (including critical care time)  Labs Reviewed  AMYLASE - Abnormal; Notable for the following:    Amylase 129 (*)    All other components within normal limits  URINALYSIS, ROUTINE W REFLEX MICROSCOPIC - Abnormal; Notable for the following:    APPearance CLOUDY (*)    Hgb urine dipstick MODERATE (*)    Leukocytes, UA MODERATE (*)    All other components within normal limits  POCT I-STAT, CHEM 8 - Abnormal; Notable for the following:    Glucose, Bld 132 (*)    All other components within normal limits  CBC  DIFFERENTIAL  URINE MICROSCOPIC-ADD ON  LIPASE, BLOOD   Ct Abdomen Pelvis W Contrast  11/13/2011  *RADIOLOGY REPORT*  Clinical Data: Left lower quadrant abdominal pain.  CT ABDOMEN AND PELVIS WITH CONTRAST  Technique:  Multidetector CT imaging of the abdomen and pelvis was performed following the standard protocol during bolus administration of intravenous contrast.   Contrast:  100 mL of Omnipaque 300 IV contrast  Comparison: Abdominal radiograph performed 10/13/2007  Findings: The visualized lung bases are clear.  The liver and spleen are unremarkable in appearance.  The gallbladder is within normal limits.  The pancreas and adrenal glands are unremarkable.  There is mild left-sided hydronephrosis, with left-sided perinephric stranding and fluid, and prominence of the left ureter to the level of an obstructing 4 mm stone at the left vesicoureteral junction.  Mild enlargement of the left kidney could conceivably reflect mild pyelonephritis.  There is mild right renal scarring.  No nonobstructing renal stones are identified.  No free fluid is identified.  The small bowel is unremarkable in appearance.  The stomach is within normal limits.  No acute vascular abnormalities are seen.  Mild scattered calcification is noted along the abdominal aorta and its branches.  The appendix is normal in caliber, without evidence for appendicitis.  Diffuse diverticulosis is noted along the descending colon, and scattered diverticulosis is seen along the ascending and transverse colon.  The colon is otherwise unremarkable in appearance.  The bladder is mildly distended and grossly unremarkable in appearance.  The prostate is enlarged, measuring 5.4 cm in transverse dimension.  No inguinal lymphadenopathy is seen.  No acute osseous abnormalities are identified.  IMPRESSION:  1.  Mild left-sided hydronephrosis, with left-sided perinephric stranding and fluid, and an obstructing 4 mm distal stone at the left vesicoureteral junction. 2.  Mild left renal enlargement; mild pyelonephritis cannot be excluded. 3.  Mild right renal scarring noted. 4.  Diffuse diverticulosis along the descending colon, and scattered diverticulosis along the ascending and transverse colon, without evidence of diverticulitis. 5.  Mild scattered calcification along the abdominal aorta and its branches. 6.  Enlarged prostate  noted.  Original Report Authenticated By: Tonia Ghent, M.D.     1. Ureteral stone with hydronephrosis     6:53 AM Pain improved after IV dilaudid.  No vomiting, pt is reassured, given Rx fro pain, nausea and flomax.  referred to urology for follow up, strainer given.    MDM  Pt with mild to moderate tenderness without any guard or rebound on exam, mostly LLQ.  Will get CT to r/o diverticulitis, not septic appearing.  Risks include age greater than 50 and some psych history.          Gavin Pound. Oletta Lamas, MD 11/13/11 1610

## 2011-11-13 NOTE — ED Notes (Signed)
Pt began having LLQ abdominal pain 2 hours ago.  No n/v or diarrhea with this.  No fever.  Pt abdomen distended but pt states that this is his usual

## 2012-02-08 DIAGNOSIS — E349 Endocrine disorder, unspecified: Secondary | ICD-10-CM | POA: Insufficient documentation

## 2012-04-06 ENCOUNTER — Emergency Department (HOSPITAL_COMMUNITY)
Admission: EM | Admit: 2012-04-06 | Discharge: 2012-04-06 | Disposition: A | Discharge status: Home / Self Care | Payer: Medicare Other | Attending: Emergency Medicine | Admitting: Emergency Medicine

## 2012-04-06 ENCOUNTER — Encounter (HOSPITAL_COMMUNITY): Payer: Self-pay | Admitting: *Deleted

## 2012-04-06 DIAGNOSIS — F17200 Nicotine dependence, unspecified, uncomplicated: Secondary | ICD-10-CM | POA: Insufficient documentation

## 2012-04-06 DIAGNOSIS — M25519 Pain in unspecified shoulder: Secondary | ICD-10-CM | POA: Insufficient documentation

## 2012-04-06 DIAGNOSIS — F3189 Other bipolar disorder: Secondary | ICD-10-CM | POA: Insufficient documentation

## 2012-04-06 DIAGNOSIS — E785 Hyperlipidemia, unspecified: Secondary | ICD-10-CM | POA: Insufficient documentation

## 2012-04-06 DIAGNOSIS — I10 Essential (primary) hypertension: Secondary | ICD-10-CM | POA: Insufficient documentation

## 2012-04-06 MED ORDER — TRAMADOL HCL 50 MG PO TABS: 50.0000 mg | ORAL_TABLET | Freq: Four times a day (QID) | ORAL | Status: AC | PRN

## 2012-04-06 NOTE — ED Provider Notes (Signed)
History  This chart was scribed for Benny Lennert, MD by Erskine Emery. This patient was seen in room TR06C/TR06C and the patient's care was started at 12:05.   CSN: [id number]  Arrival date & time 04/06/12  1129   First MD Initiated Contact with Patient 04/06/12 1205      Chief Complaint  Patient presents with  . Shoulder Pain    (Consider location/radiation/quality/duration/timing/severity/associated sxs/prior Treatment) Bradley [name] is a 61 y.o. male who presents to the Emergency Department complaining of pain in the right shoulder for the past 2 days. Pt reports this is the first time he has seen a doctor about this. Pt reports when he lays down to sleep the pain bothers him. Pt denies taking any medications for the pain.  Patient is a 61 y.o. male presenting with shoulder pain. The history is provided by the patient. No language interpreter was used.  Shoulder Pain This is a new problem. The current episode started 2 days ago. The problem occurs constantly. The problem has not changed since onset.Pertinent negatives include no shortness of breath. Exacerbated by: laying down. Nothing relieves the symptoms. He has tried nothing for the symptoms. The treatment provided no relief.   Pt is on disability for bipolar disorder. Pt reports he has no current PCP.   Past Medical History  Diagnosis Date  . Hypertension   . Hyperlipemia   . Bipolar 2 disorder     History reviewed. No pertinent past surgical history.  History reviewed. No pertinent family history.  History  Substance Use Topics  . Smoking status: Current Everyday Smoker -- 1.0 packs/day  . Smokeless tobacco: Not on file  . Alcohol Use: No      Review of Systems  Constitutional: Negative for fever and chills.  Respiratory: Negative for shortness of breath.   Gastrointestinal: Negative for nausea and vomiting.  Musculoskeletal:       Left shoulder pain  Neurological: Negative for weakness.    Allergies   Review of patient's allergies indicates no known allergies.  Home Medications   Current Outpatient Rx  Name Route Sig Dispense Refill  . AMLODIPINE BESYLATE 10 MG PO TABS Oral Take 10 mg by mouth daily.    Marland Kitchen DIVALPROEX SODIUM ER 500 MG PO TB24 Oral Take 1,000 mg by mouth at bedtime.    Marland Kitchen LISINOPRIL 40 MG PO TABS Oral Take 40 mg by mouth daily.    Marland Kitchen PRAVASTATIN SODIUM 80 MG PO TABS Oral Take 80 mg by mouth daily.    Marland Kitchen RISPERIDONE 4 MG PO TABS Oral Take 4 mg by mouth 2 (two) times daily.    . TESTOSTERONE 50 MG/5GM TD GEL Transdermal Place 5 g onto the skin daily.      BP 131/55  Pulse 81  Temp 98.7 F (37.1 C) (Oral)  Resp 16  SpO2 95%  Physical Exam  Constitutional: He is oriented to person, place, and time. He appears well-developed.  HENT:  Head: Normocephalic.  Eyes: Conjunctivae are normal.  Neck: No tracheal deviation present.  Cardiovascular:  No murmur heard. Musculoskeletal: Normal range of motion. He exhibits tenderness.       Minimal tenderness to the right shoulder, but full ROM.  Neurological: He is oriented to person, place, and time.  Skin: Skin is warm.  Psychiatric: He has a normal mood and affect.    ED Course  Procedures (including critical care time) DIAGNOSTIC STUDIES: Oxygen Saturation is 95% on room air, adequate by my interpretation.  COORDINATION OF CARE: 12:12--I evaluated the patient and we discussed a treatment plan including pain medication to which the pt agreed.    Labs Reviewed - No data to display No results found.   No diagnosis found.    MDM        The chart was scribed for me under my direct supervision.  I personally performed the history, physical, and medical decision making and all procedures in the evaluation of this patient.Benny Lennert, MD 04/06/12 [phone]

## 2012-04-06 NOTE — ED Notes (Signed)
Pt reports two day hx of right shoulder pain, ROM intact, denies any numbness or tingling. Denies abdominal pain or associated symptoms.

## 2012-04-13 ENCOUNTER — Emergency Department (HOSPITAL_COMMUNITY): Payer: Medicare Other

## 2012-04-13 ENCOUNTER — Encounter (HOSPITAL_COMMUNITY): Payer: Self-pay | Admitting: Emergency Medicine

## 2012-04-13 ENCOUNTER — Emergency Department (HOSPITAL_COMMUNITY)
Admission: EM | Admit: 2012-04-13 | Discharge: 2012-04-13 | Disposition: A | Discharge status: Home / Self Care | Payer: Medicare Other | Attending: Emergency Medicine | Admitting: Emergency Medicine

## 2012-04-13 DIAGNOSIS — K029 Dental caries, unspecified: Secondary | ICD-10-CM | POA: Insufficient documentation

## 2012-04-13 DIAGNOSIS — M719 Bursopathy, unspecified: Secondary | ICD-10-CM | POA: Insufficient documentation

## 2012-04-13 DIAGNOSIS — M67919 Unspecified disorder of synovium and tendon, unspecified shoulder: Secondary | ICD-10-CM | POA: Insufficient documentation

## 2012-04-13 DIAGNOSIS — I10 Essential (primary) hypertension: Secondary | ICD-10-CM | POA: Insufficient documentation

## 2012-04-13 DIAGNOSIS — F17200 Nicotine dependence, unspecified, uncomplicated: Secondary | ICD-10-CM | POA: Insufficient documentation

## 2012-04-13 DIAGNOSIS — M7550 Bursitis of unspecified shoulder: Secondary | ICD-10-CM

## 2012-04-13 DIAGNOSIS — E785 Hyperlipidemia, unspecified: Secondary | ICD-10-CM | POA: Insufficient documentation

## 2012-04-13 DIAGNOSIS — F319 Bipolar disorder, unspecified: Secondary | ICD-10-CM | POA: Insufficient documentation

## 2012-04-13 MED ORDER — HYDROCODONE-ACETAMINOPHEN 5-500 MG PO TABS: 1.0000 | ORAL_TABLET | Freq: Four times a day (QID) | ORAL | Status: AC | PRN

## 2012-04-13 MED ORDER — PENICILLIN V POTASSIUM 500 MG PO TABS: 500.0000 mg | ORAL_TABLET | Freq: Four times a day (QID) | ORAL | Status: AC

## 2012-04-13 NOTE — ED Notes (Signed)
Pt c/o right upper toothache x 10 days with pain in right shoulder

## 2012-04-13 NOTE — ED Notes (Signed)
Pt states the pain in his R shoulder began about 8 days ago and the pain in his tooth began last night.

## 2012-04-13 NOTE — Discharge Instructions (Signed)
Bursitis  Bursitis is when the fluid-filled sac (bursa) that covers and protects a joint gets puffy and irritated. The elbow, shoulder, hip, and knee joints are most often affected.  HOME CARE   Put ice on the area.    Put ice in a plastic bag.    Place a towel between your skin and the bag.    Leave the ice on for 15 to 20 minutes, 3 to 4 times a day.    Put the joint through a full range of motion 4 times a day. Rest the injured joint at other times. When you have less pain, begin slow movements and usual activities.    Only take medicine as told by your doctor.    Follow up with your doctor. Any delay in care could stop the bursitis from healing. This could cause long-term pain.   GET HELP RIGHT AWAY IF:     You have more pain with treatment.    You have a temperature by mouth above 102 F (38.9 C), not controlled by medicine.    You have heat and irritation over the fluid-filled sac.   MAKE SURE YOU:     Understand these instructions.    Will watch your condition.    Will get help right away if you are not doing well or get worse.   Document Released: 01/24/2010 Document Revised: 07/26/2011 Document Reviewed: 01/24/2010  [employer] Patient Information 2012 [employer]

## 2012-04-13 NOTE — ED Provider Notes (Addendum)
History  Scribed for Bradley Sprout, MD, the patient was seen in room TR09C/TR09C. This chart was scribed by Candelaria Stagers. The patient's care started at 11:46 AM   CSN: [id number]  Arrival date & time 04/13/12  1009   First MD Initiated Contact with Patient 04/13/12 1139      Chief Complaint  Patient presents with  . Dental Pain  . Shoulder Pain     Patient is a 61 y.o. male presenting with shoulder pain. The history is provided by the patient. No language interpreter was used.  Shoulder Pain   Bradley [name] is a 61 y.o. male who presents to the Emergency Department complaining of right shoulder pain that started about eight days ago.  Pt was seen in the ED for the shoulder pain and was given tramadol with no relief.  He denies any injury and has never had trouble with this shoulder before.  He is also experiencing a toothache that started yesterday.    Past Medical History  Diagnosis Date  . Hypertension   . Hyperlipemia   . Bipolar 2 disorder     History reviewed. No pertinent past surgical history.  History reviewed. No pertinent family history.  History  Substance Use Topics  . Smoking status: Current Everyday Smoker -- 1.0 packs/day  . Smokeless tobacco: Not on file  . Alcohol Use: No      Review of Systems  HENT: Positive for dental problem. Negative for neck pain.   Musculoskeletal: Positive for arthralgias (right shoulder pain).  All other systems reviewed and are negative.    Allergies  Review of patient's allergies indicates no known allergies.  Home Medications   Current Outpatient Rx  Name Route Sig Dispense Refill  . AMLODIPINE BESYLATE 10 MG PO TABS Oral Take 10 mg by mouth daily.    Marland Kitchen DIVALPROEX SODIUM ER 500 MG PO TB24 Oral Take 1,000 mg by mouth at bedtime.    Marland Kitchen LISINOPRIL 40 MG PO TABS Oral Take 40 mg by mouth daily.    Marland Kitchen PRAVASTATIN SODIUM 80 MG PO TABS Oral Take 80 mg by mouth daily.    Marland Kitchen RISPERIDONE 4 MG PO TABS Oral Take 4 mg  by mouth 2 (two) times daily.    . TESTOSTERONE 50 MG/5GM TD GEL Transdermal Place 5 g onto the skin daily.    . TRAMADOL HCL 50 MG PO TABS Oral Take 1 tablet (50 mg total) by mouth every 6 (six) hours as needed for pain. 25 tablet 0    BP 147/91  Pulse 69  Temp 98.9 F (37.2 C) (Oral)  Resp 18  SpO2 97%  Physical Exam  Nursing note and vitals reviewed. Constitutional: He is oriented to person, place, and time. He appears well-developed and well-nourished. No distress.  HENT:  Head: Normocephalic and atraumatic.  Mouth/Throat:         Partially missing and decayed.   Eyes: Conjunctivae are normal. Right eye exhibits no discharge. Left eye exhibits no discharge.  Neck: Normal range of motion.  Pulmonary/Chest: Effort normal.  Musculoskeletal: Normal range of motion.       Tenderness with pressure applied to humeral head.  No AC tenderness, trapezius tenderness, or neck tenderness.  Normal ROM.  No elbow pain.   Neurological: He is alert and oriented to person, place, and time.  Skin: Skin is warm and dry. He is not diaphoretic.  Psychiatric: He has a normal mood and affect. His behavior is normal.    ED Course  Dental Date/Time: 04/13/2012 1:41 PM Performed by: Bradley [name] Authorized by: Bradley [name] Consent: Verbal consent obtained. Consent given by: patient Local anesthesia used: yes Anesthesia: local infiltration (apical dental block) Local anesthetic: bupivacaine 0.5% without epinephrine Anesthetic total: 2 ml Patient sedated: no Patient tolerance: Patient tolerated the procedure well with no immediate complications. Comments: Resolution of pain     DIAGNOSTIC STUDIES: Oxygen Saturation is 97% on room air, normal by my interpretation.    COORDINATION OF CARE:  11:52 Ordered: DG Shoulder Right   Labs Reviewed - No data to display Dg Shoulder Right  04/13/2012  *RADIOLOGY REPORT*  Clinical Data: Right shoulder pain.  RIGHT SHOULDER - 2+ VIEW   Comparison: None  Findings: Moderate AC joint degenerative changes.  The glenohumeral joint is maintained.  No acute fracture.  The right lung apex is clear.  IMPRESSION: AC joint degenerative changes but no acute bony findings.   Original Report Authenticated By: P. Loralie Champagne, M.D.      1. Dental caries   2. Shoulder bursitis       MDM   Patient with right shoulder pain most consistent with bursitis. Only pain that is reproducible is with palpation over the humeral head. There is no a.c. joint tenderness despite x-ray findings of joint degenerative changes. Normal function of the hand and able to range his shoulder without difficulty. Secondly, Pt with dental caries and no facial swelling.  No signs of ludwig's angina or difficulty swallowing and no systemic symptoms. Will treat with PCN and have pt f/u with dentist.    I personally performed the services described in this documentation, which was scribed in my presence.  The recorded information has been reviewed and considered.       Bradley Sprout, MD 04/13/12 1323  Bradley Sprout, MD 04/13/12 1326  Bradley Sprout, MD 04/13/12 1342

## 2012-11-21 ENCOUNTER — Emergency Department (HOSPITAL_COMMUNITY)
Admission: EM | Admit: 2012-11-21 | Discharge: 2012-11-21 | Disposition: A | Discharge status: Home / Self Care | Payer: Medicare Other | Attending: Emergency Medicine | Admitting: Emergency Medicine

## 2012-11-21 DIAGNOSIS — K029 Dental caries, unspecified: Secondary | ICD-10-CM | POA: Insufficient documentation

## 2012-11-21 DIAGNOSIS — E785 Hyperlipidemia, unspecified: Secondary | ICD-10-CM | POA: Insufficient documentation

## 2012-11-21 DIAGNOSIS — F3189 Other bipolar disorder: Secondary | ICD-10-CM | POA: Insufficient documentation

## 2012-11-21 DIAGNOSIS — Z79899 Other long term (current) drug therapy: Secondary | ICD-10-CM | POA: Insufficient documentation

## 2012-11-21 DIAGNOSIS — I10 Essential (primary) hypertension: Secondary | ICD-10-CM | POA: Insufficient documentation

## 2012-11-21 DIAGNOSIS — K089 Disorder of teeth and supporting structures, unspecified: Secondary | ICD-10-CM | POA: Insufficient documentation

## 2012-11-21 DIAGNOSIS — F17200 Nicotine dependence, unspecified, uncomplicated: Secondary | ICD-10-CM | POA: Insufficient documentation

## 2012-11-21 DIAGNOSIS — K0889 Other specified disorders of teeth and supporting structures: Secondary | ICD-10-CM

## 2012-11-21 MED ORDER — HYDROCODONE-ACETAMINOPHEN 5-325 MG PO TABS: 1.0000 | ORAL_TABLET | ORAL | Status: DC | PRN

## 2012-11-21 NOTE — ED Provider Notes (Signed)
History     CSN: [id number]  Arrival date & time 11/21/12  1158   First MD Initiated Contact with Patient 11/21/12 1219      Chief Complaint  Patient presents with  . Dental Pain    (Consider location/radiation/quality/duration/timing/severity/associated sxs/prior treatment) HPI Comments: 62 year old male presents emergency department complaining of right upper tooth pain beginning 1 day ago. Patient states he was laying in bed last night when the pain began. Describes the pain as sharp and throbbing, rated 10 out of 10. He has not tried any alleviating factors at this time. Chewing on any food makes the pain worse. Denies fever, chills, facial swelling, difficulty swallowing. He cannot remember the name of his dentist.  Patient is a 62 y.o. male presenting with tooth pain. The history is provided by the patient.  Dental PainPrimary symptoms do not include fever.  Additional symptoms do not include: facial swelling.    Past Medical History  Diagnosis Date  . Hypertension   . Hyperlipemia   . Bipolar 2 disorder     No past surgical history on file.  No family history on file.  History  Substance Use Topics  . Smoking status: Current Every Day Smoker -- 1.00 packs/day  . Smokeless tobacco: Not on file  . Alcohol Use: No      Review of Systems  Constitutional: Negative for fever and chills.  HENT: Positive for dental problem. Negative for facial swelling.   All other systems reviewed and are negative.    Allergies  Review of patient's allergies indicates no known allergies.  Home Medications   Current Outpatient Rx  Name  Route  Sig  Dispense  Refill  . amLODipine (NORVASC) 10 MG tablet   Oral   Take 10 mg by mouth daily.         . divalproex (DEPAKOTE ER) 500 MG 24 hr tablet   Oral   Take 1,000 mg by mouth at bedtime.         Marland Kitchen lisinopril (PRINIVIL,ZESTRIL) 40 MG tablet   Oral   Take 40 mg by mouth daily.         . pravastatin (PRAVACHOL) 80 MG  tablet   Oral   Take 80 mg by mouth daily.         . risperidone (RISPERDAL) 4 MG tablet   Oral   Take 4 mg by mouth 2 (two) times daily.         Marland Kitchen testosterone cypionate (DEPOTESTOTERONE CYPIONATE) 200 MG/ML injection   Intramuscular   Inject 200 mg into the muscle every 14 (fourteen) days.         Marland Kitchen HYDROcodone-acetaminophen (NORCO/VICODIN) 5-325 MG per tablet   Oral   Take 1 tablet by mouth every 4 (four) hours as needed for pain.   6 tablet   0     BP 136/86  Pulse 97  Temp(Src) 97 F (36.1 C) (Oral)  Resp 14  SpO2 94%  Physical Exam  Nursing note and vitals reviewed. Constitutional: He is oriented to person, place, and time. He appears well-developed and well-nourished. No distress.  HENT:  Head: Normocephalic and atraumatic.  Mouth/Throat: Abnormal dentition. Dental caries present. No dental abscesses.    Poor dentition throughout. Multiple dental caries and decayed teeth. Tenderness to palpation of the right upper posterior first and last molar, second molar is no longer present. No surrounding erythema, edema or abscess.  Eyes: Conjunctivae and EOM are normal.  Neck: Normal range of motion. Neck supple.  Cardiovascular: Normal rate, regular rhythm and normal heart sounds.   Pulmonary/Chest: Effort normal and breath sounds normal.  Abdominal: Soft. Bowel sounds are normal. He exhibits no distension.  Musculoskeletal: Normal range of motion. He exhibits no edema.  Neurological: He is alert and oriented to person, place, and time.  Skin: Skin is warm and dry.  Psychiatric: He has a normal mood and affect. His behavior is normal.    ED Course  Procedures (including critical care time)  Labs Reviewed - No data to display No results found.   1. Pain, dental   2. Dental caries       MDM   Dental pain without associated with dental infection. No evidence of dental abscess. Patient is afebrile, non toxic appearing and swallowing secretions well. I  gave patient referral to dentist and stressed the importance of dental follow up for ultimate management of dental pain. I will give pain control. Patient voices understanding and is agreeable to plan.         Trevor Mace, PA-C 11/21/12 1302

## 2012-11-21 NOTE — ED Notes (Signed)
Pt has pain to the right upper side of his mouth, pt has some slight swelling to the that side. Pt has some black areas to the gums and some chipped teeth where he states the pain is at.

## 2012-11-28 NOTE — ED Provider Notes (Signed)
Medical screening examination/treatment/procedure(s) were performed by non-physician practitioner and as supervising physician I was immediately available for consultation/collaboration.  Derwood Kaplan, MD 11/28/12 1843

## 2013-02-24 ENCOUNTER — Encounter: Payer: Self-pay | Admitting: Internal Medicine

## 2013-05-27 ENCOUNTER — Telehealth: Payer: Self-pay | Admitting: Hematology and Oncology

## 2013-05-27 NOTE — Telephone Encounter (Signed)
Left vm to return call in  Ref to np appt.

## 2013-05-28 ENCOUNTER — Telehealth: Payer: Self-pay | Admitting: Hematology and Oncology

## 2013-05-28 NOTE — Telephone Encounter (Signed)
S/w pt and gve np appt 10/29 @ 10:30 w/Dr. Bertis Ruddy Referring Dr. Julio Sicks Dx- Thombocytopenia Welcome packet mailed.

## 2013-05-28 NOTE — Telephone Encounter (Signed)
2nd lvom for pt to return call in re to referral  °

## 2013-05-29 ENCOUNTER — Telehealth: Payer: Self-pay | Admitting: Hematology and Oncology

## 2013-05-29 NOTE — Telephone Encounter (Signed)
C/D 05/29/13 for appt. 06/17/13

## 2013-06-17 ENCOUNTER — Encounter: Payer: Self-pay | Admitting: Hematology and Oncology

## 2013-06-17 ENCOUNTER — Ambulatory Visit: Payer: Medicare Other

## 2013-06-17 ENCOUNTER — Telehealth: Payer: Self-pay | Admitting: Hematology and Oncology

## 2013-06-17 ENCOUNTER — Ambulatory Visit (HOSPITAL_BASED_OUTPATIENT_CLINIC_OR_DEPARTMENT_OTHER): Payer: Medicare Other | Admitting: Lab

## 2013-06-17 ENCOUNTER — Ambulatory Visit (HOSPITAL_BASED_OUTPATIENT_CLINIC_OR_DEPARTMENT_OTHER): Payer: Medicare Other | Admitting: Hematology and Oncology

## 2013-06-17 VITALS — Wt 231.0 lb

## 2013-06-17 DIAGNOSIS — D696 Thrombocytopenia, unspecified: Secondary | ICD-10-CM

## 2013-06-17 DIAGNOSIS — F3181 Bipolar II disorder: Secondary | ICD-10-CM

## 2013-06-17 DIAGNOSIS — D72820 Lymphocytosis (symptomatic): Secondary | ICD-10-CM

## 2013-06-17 DIAGNOSIS — D6959 Other secondary thrombocytopenia: Secondary | ICD-10-CM

## 2013-06-17 DIAGNOSIS — F17200 Nicotine dependence, unspecified, uncomplicated: Secondary | ICD-10-CM

## 2013-06-17 HISTORY — DX: Other secondary thrombocytopenia: D69.59

## 2013-06-17 LAB — CBC WITH DIFFERENTIAL/PLATELET
Eosinophils Absolute: 0.2 10*3/uL (ref 0.0–0.5)
HCT: 43.9 % (ref 38.4–49.9)
LYMPH%: 42.5 % (ref 14.0–49.0)
MCHC: 33.5 g/dL (ref 32.0–36.0)
MCV: 84.7 fL (ref 79.3–98.0)
MONO#: 0.5 10*3/uL (ref 0.1–0.9)
MONO%: 9.3 % (ref 0.0–14.0)
NEUT#: 2.5 10*3/uL (ref 1.5–6.5)
NEUT%: 44.3 % (ref 39.0–75.0)
Platelets: 113 10*3/uL — ABNORMAL LOW (ref 140–400)
RBC: 5.18 10*6/uL (ref 4.20–5.82)
WBC: 5.7 10*3/uL (ref 4.0–10.3)

## 2013-06-17 LAB — COMPREHENSIVE METABOLIC PANEL (CC13)
ALT: 8 U/L (ref 0–55)
AST: 7 U/L (ref 5–34)
Alkaline Phosphatase: 53 U/L (ref 40–150)
Anion Gap: 9 mEq/L (ref 3–11)
Chloride: 108 mEq/L (ref 98–109)
Potassium: 4.2 mEq/L (ref 3.5–5.1)
Sodium: 139 mEq/L (ref 136–145)
Total Protein: 8.8 g/dL — ABNORMAL HIGH (ref 6.4–8.3)

## 2013-06-17 LAB — MORPHOLOGY: PLT EST: DECREASED

## 2013-06-17 LAB — LACTATE DEHYDROGENASE (CC13): LDH: 164 U/L (ref 125–245)

## 2013-06-17 LAB — VITAMIN B12: Vitamin B-12: 626 pg/mL (ref 211–911)

## 2013-06-17 LAB — CHCC SMEAR

## 2013-06-17 NOTE — Progress Notes (Signed)
Checked in new patient with no financial issues. Gave him appt card.

## 2013-06-17 NOTE — Progress Notes (Signed)
[facility] CONSULT NOTE  Patient Care Team: Jackie Plum, MD as PCP - General (Internal Medicine)  CHIEF COMPLAINTS/PURPOSE OF CONSULTATION:  Thrombocytopenia  HISTORY OF PRESENTING ILLNESS:  Bradley [name] 62 y.o. male is here because of new onset thrombocytopenia. According to the patient, he had routine blood checked in his physician office and due to thrombocytopenia was referred here. Her review his electronic records dated back to several years ago. His platelet counts has been within normal limits. On 04/23/2013, CBC showed a platelet count of 118 with normal white count and hemoglobin. The differential on the CBC show very mild lymphocytosis. The patient denies any recent signs or symptoms of bleeding such as spontaneous epistaxis, hematuria or hematochezia. The patient never received blood transfusion before. He has been on chronic antipsychotic medication for bipolar disorder and has no new medication recently. MEDICAL HISTORY:  Past Medical History  Diagnosis Date  . Hypertension   . Hyperlipemia   . Bipolar 2 disorder   . Hypogonadism male   . Thrombocytopenia, unspecified 06/17/2013    SURGICAL HISTORY: History reviewed. No pertinent past surgical history.  SOCIAL HISTORY: History   Social History  . Marital Status: Divorced    Spouse Name: N/A    Number of Children: N/A  . Years of Education: N/A   Occupational History  . Not on file.   Social History Main Topics  . Smoking status: Current Every Day Smoker -- 1.00 packs/day for 0 years  . Smokeless tobacco: Not on file  . Alcohol Use: No  . Drug Use: No  . Sexual Activity: Not on file   Other Topics Concern  . Not on file   Social History Narrative  . No narrative on file    FAMILY HISTORY: Family History  Problem Relation Age of Onset  . Cancer Brother     abdominal CA    ALLERGIES:  has No Known Allergies.  MEDICATIONS:  Current Outpatient Prescriptions   Medication Sig Dispense Refill  . amLODipine (NORVASC) 10 MG tablet Take 10 mg by mouth daily.      . divalproex (DEPAKOTE ER) 500 MG 24 hr tablet Take 1,000 mg by mouth at bedtime.      Marland Kitchen HYDROcodone-acetaminophen (NORCO/VICODIN) 5-325 MG per tablet Take 1 tablet by mouth every 4 (four) hours as needed for pain.  6 tablet  0  . lisinopril (PRINIVIL,ZESTRIL) 40 MG tablet Take 40 mg by mouth daily.      . metFORMIN (GLUCOPHAGE) 500 MG tablet Take 500 mg by mouth daily.      . pravastatin (PRAVACHOL) 80 MG tablet Take 80 mg by mouth daily.      . risperidone (RISPERDAL) 4 MG tablet Take 4 mg by mouth 2 (two) times daily.      Marland Kitchen testosterone cypionate (DEPOTESTOTERONE CYPIONATE) 200 MG/ML injection Inject 200 mg into the muscle every 14 (fourteen) days.       No current facility-administered medications for this visit.    REVIEW OF SYSTEMS:   Constitutional: Denies fevers, chills or abnormal night sweats Eyes: Denies blurriness of vision, double vision or watery eyes Ears, nose, mouth, throat, and face: Denies mucositis or sore throat Respiratory: Denies cough, dyspnea or wheezes Cardiovascular: Denies palpitation, chest discomfort or lower extremity swelling Gastrointestinal:  Denies nausea, heartburn or change in bowel habits Skin: Denies abnormal skin rashes Lymphatics: Denies new lymphadenopathy or easy bruising Neurological:Denies numbness, tingling or new weaknesses Behavioral/Psych: Mood is stable, no new changes  All other systems  were reviewed with the patient and are negative.  PHYSICAL EXAMINATION: ECOG PERFORMANCE STATUS: 0 - Asymptomatic  There were no vitals filed for this visit. Filed Weights   06/17/13 1035  Weight: 231 lb (104.781 kg)    GENERAL:alert, no distress and comfortable. The patient is mildly obese SKIN: skin color, texture, turgor are normal, no rashes or significant lesions EYES: normal, conjunctiva are pink and non-injected, sclera  clear OROPHARYNX:no exudate, no erythema and lips, buccal mucosa, and tongue normal  NECK: supple, thyroid normal size, non-tender, without nodularity LYMPH:  no palpable lymphadenopathy in the cervical, axillary or inguinal LUNGS: clear to auscultation and percussion with normal breathing effort HEART: regular rate & rhythm and no murmurs and no lower extremity edema ABDOMEN:abdomen soft, non-tender and normal bowel sounds. Unable to appreciate splenomegaly Musculoskeletal:no cyanosis of digits and no clubbing  PSYCH: alert & oriented x 3 with fluent speech NEURO: no focal motor/sensory deficits  LABORATORY DATA:  I have reviewed the data as listed Recent Results (from the past 2160 hour(s))  CBC WITH DIFFERENTIAL     Status: Abnormal   Collection Time    06/17/13 11:14 AM      Result Value Range   WBC 5.7  4.0 - 10.3 10e3/uL   NEUT# 2.5  1.5 - 6.5 10e3/uL   HGB 14.7  13.0 - 17.1 g/dL   HCT 40.9  81.1 - 91.4 %   Platelets 113 (*) 140 - 400 10e3/uL   MCV 84.7  79.3 - 98.0 fL   MCH 28.4  27.2 - 33.4 pg   MCHC 33.5  32.0 - 36.0 g/dL   RBC 7.82  9.56 - 2.13 10e6/uL   RDW 17.3 (*) 11.0 - 14.6 %   lymph# 2.4  0.9 - 3.3 10e3/uL   MONO# 0.5  0.1 - 0.9 10e3/uL   Eosinophils Absolute 0.2  0.0 - 0.5 10e3/uL   Basophils Absolute 0.0  0.0 - 0.1 10e3/uL   NEUT% 44.3  39.0 - 75.0 %   LYMPH% 42.5  14.0 - 49.0 %   MONO% 9.3  0.0 - 14.0 %   EOS% 3.5  0.0 - 7.0 %   BASO% 0.4  0.0 - 2.0 %  MORPHOLOGY     Status: None   Collection Time    06/17/13 11:14 AM      Result Value Range   Ovalocytes Few  Negative   Shistocytes Occ  Negative   Target Cells Occ  Negative   White Cell Comments Variant Lymphs     PLT EST Decreased  Adequate   Platelet Morphology Large and giant platelets, Occ plt clump  Within Normal Limits  LACTATE DEHYDROGENASE (CC13)     Status: None   Collection Time    06/17/13 11:14 AM      Result Value Range   LDH 164  125 - 245 U/L  [facility] SMEAR     Status: None    Collection Time    06/17/13 11:14 AM      Result Value Range   Smear Result Smear Available    COMPREHENSIVE METABOLIC PANEL (CC13)     Status: Abnormal   Collection Time    06/17/13 11:14 AM      Result Value Range   Sodium 139  136 - 145 mEq/L   Potassium 4.2  3.5 - 5.1 mEq/L   Chloride 108  98 - 109 mEq/L   CO2 21 (*) 22 - 29 mEq/L   Glucose 126  70 -  140 mg/dl   BUN 16.1  7.0 - 09.6 mg/dL   Creatinine 1.1  0.7 - 1.3 mg/dL   Total Bilirubin 0.45  0.20 - 1.20 mg/dL   Alkaline Phosphatase 53  40 - 150 U/L   AST 7  5 - 34 U/L   ALT 8  0 - 55 U/L   Total Protein 8.8 (*) 6.4 - 8.3 g/dL   Albumin 3.6  3.5 - 5.0 g/dL   Calcium 9.6  8.4 - 40.9 mg/dL   Anion Gap 9  3 - 11 mEq/L    RADIOGRAPHIC STUDIES: I have personally reviewed the radiological images as listed and agreed with the findings in the report. I reviewed his CT scan from 2013 which show no evidence of splenomegaly or lymphadenopathy  ASSESSMENT:  Acute thrombocytopenia  PLAN:  #1 thrombocytopenia The cause is unknown. It is mild. The patient denies recent history of bleeding such as epistaxis, hematuria or hematochezia. He is asymptomatic from the thrombocytopenia. I will observe for now.  he does not require transfusion now.  I will order an additional workup for evaluation of this. Certainly his antipsychotic medications can cause thrombocytopenia but the patient has been on this for a long time. #2 smoking The patient is encouraged to quit smoking. #3 mild lymphocytosis Even though he has no leukocytosis but the differential revealed a disproportionately higher lymphocyte count. I will order an additional workup to make sure we're not dealing with chronic lymphocytic leukemia or lymphoma that can sometimes cause thrombocytopenia.  Orders Placed This Encounter  Procedures  . CBC with Differential    Standing Status: Future     Number of Occurrences: 1     Standing Expiration Date: 03/09/2014  . Morphology     Standing Status: Future     Number of Occurrences: 1     Standing Expiration Date: 06/17/2014  . Comprehensive metabolic panel    Standing Status: Future     Number of Occurrences: 1     Standing Expiration Date: 06/17/2014  . Lactate dehydrogenase    Standing Status: Future     Number of Occurrences: 1     Standing Expiration Date: 06/17/2014  . ANA w/Reflex if Positive    Standing Status: Future     Number of Occurrences: 1     Standing Expiration Date: 06/17/2014  . Vitamin B12    Standing Status: Future     Number of Occurrences: 1     Standing Expiration Date: 06/17/2014  . Smear    Standing Status: Future     Number of Occurrences: 1     Standing Expiration Date: 06/17/2014    All questions were answered. The patient knows to call the clinic with any problems, questions or concerns.    [facility], Tayllor Breitenstein, MD 06/17/2013 12:46 PM

## 2013-06-17 NOTE — Telephone Encounter (Signed)
Gave pt appt for November 2014 pt sent to labs today

## 2013-07-02 ENCOUNTER — Ambulatory Visit (HOSPITAL_BASED_OUTPATIENT_CLINIC_OR_DEPARTMENT_OTHER): Payer: Medicare Other | Admitting: Hematology and Oncology

## 2013-07-02 VITALS — BP 140/82 | HR 74 | Temp 98.2°F | Resp 18 | Wt 232.0 lb

## 2013-07-02 DIAGNOSIS — D696 Thrombocytopenia, unspecified: Secondary | ICD-10-CM

## 2013-07-02 NOTE — Progress Notes (Signed)
[facility] OFFICE PROGRESS NOTE  [name],GEORGE, MD DIAGNOSIS:  New thrombocytopenia  SUMMARY OF HEMATOLOGIC HISTORY: This is a pleasant 62 year old gentleman with background history of bipolar disorder was found to have thrombocytopenia on routine CBC. INTERVAL HISTORY: Bradley [name] 62 y.o. male returns for further followup. He is feeling well. The patient denies any recent signs or symptoms of bleeding such as spontaneous epistaxis, hematuria or hematochezia.  I have reviewed the past medical history, past surgical history, social history and family history with the patient and they are unchanged from previous note.  ALLERGIES:  has No Known Allergies.  MEDICATIONS:  Current Outpatient Prescriptions  Medication Sig Dispense Refill  . amLODipine (NORVASC) 10 MG tablet Take 10 mg by mouth daily.      . divalproex (DEPAKOTE ER) 500 MG 24 hr tablet Take 1,000 mg by mouth at bedtime.      Marland Kitchen HYDROcodone-acetaminophen (NORCO/VICODIN) 5-325 MG per tablet Take 1 tablet by mouth every 4 (four) hours as needed for pain.  6 tablet  0  . lisinopril (PRINIVIL,ZESTRIL) 40 MG tablet Take 40 mg by mouth daily.      . metFORMIN (GLUCOPHAGE) 500 MG tablet Take 500 mg by mouth daily.      . pravastatin (PRAVACHOL) 80 MG tablet Take 80 mg by mouth daily.      . risperidone (RISPERDAL) 4 MG tablet Take 4 mg by mouth 2 (two) times daily.      Marland Kitchen testosterone cypionate (DEPOTESTOTERONE CYPIONATE) 200 MG/ML injection Inject 200 mg into the muscle every 14 (fourteen) days.       No current facility-administered medications for this visit.     REVIEW OF SYSTEMS:   Constitutional: Denies fevers, chills or night sweats All other systems were reviewed with the patient and are negative.  PHYSICAL EXAMINATION: ECOG PERFORMANCE STATUS: 0 - Asymptomatic  Filed Vitals:   07/02/13 1302  BP: 140/82  Pulse: 74  Temp: 98.2 F (36.8 C)  Resp: 18   Filed Weights   07/02/13 1302   Weight: 232 lb (105.235 kg)    GENERAL:alert, no distress and comfortable NEURO: alert & oriented x 3 with fluent speech, no focal motor/sensory deficits  LABORATORY DATA:  I have reviewed the data as listed I reviewed his peripheral blood smear myself. There were a few platelet clumping seen. A few of the platelet cells were large in size. No schistocytes were seen. The morphology of the red blood cell and white blood cells were normal ASSESSMENT:   thrombocytopenia, possible platelet clumping versus ITP  PLAN:  #1 Thrombocytopenia I suspect this could be related to either mild platelet clumping versus ITP. Medication side effects cannot be excluded. Overall, he does not need treatment as he is asymptomatic. Review of bone marrow aspirate and biopsy is low. I recommend observation only. I will see him back in 6 months with blood work, history and physical examination. All questions were answered. The patient knows to call the clinic with any problems, questions or concerns. No barriers to learning was detected.  I spent 15 minutes counseling the patient face to face. The total time spent in the appointment was 20 minutes and more than 50% was on counseling.     [facility], Evanny Ellerbe, MD 07/02/2013 4:28 PM

## 2013-07-03 ENCOUNTER — Telehealth: Payer: Self-pay | Admitting: Hematology and Oncology

## 2013-07-03 NOTE — Telephone Encounter (Signed)
lvm for pt regarding to May 2015 appt...mailed pt avs/letter and appt sched

## 2013-09-21 ENCOUNTER — Encounter (HOSPITAL_COMMUNITY): Payer: Self-pay | Admitting: Emergency Medicine

## 2013-09-21 ENCOUNTER — Emergency Department (HOSPITAL_COMMUNITY)
Admission: EM | Admit: 2013-09-21 | Discharge: 2013-09-21 | Disposition: A | Discharge status: Home / Self Care | Payer: Medicare Other | Attending: Emergency Medicine | Admitting: Emergency Medicine

## 2013-09-21 DIAGNOSIS — Z79899 Other long term (current) drug therapy: Secondary | ICD-10-CM | POA: Insufficient documentation

## 2013-09-21 DIAGNOSIS — K0889 Other specified disorders of teeth and supporting structures: Secondary | ICD-10-CM

## 2013-09-21 DIAGNOSIS — K089 Disorder of teeth and supporting structures, unspecified: Secondary | ICD-10-CM | POA: Insufficient documentation

## 2013-09-21 DIAGNOSIS — I10 Essential (primary) hypertension: Secondary | ICD-10-CM | POA: Insufficient documentation

## 2013-09-21 DIAGNOSIS — F3189 Other bipolar disorder: Secondary | ICD-10-CM | POA: Insufficient documentation

## 2013-09-21 DIAGNOSIS — Z862 Personal history of diseases of the blood and blood-forming organs and certain disorders involving the immune mechanism: Secondary | ICD-10-CM | POA: Insufficient documentation

## 2013-09-21 DIAGNOSIS — K029 Dental caries, unspecified: Secondary | ICD-10-CM | POA: Insufficient documentation

## 2013-09-21 DIAGNOSIS — E785 Hyperlipidemia, unspecified: Secondary | ICD-10-CM | POA: Insufficient documentation

## 2013-09-21 DIAGNOSIS — F17200 Nicotine dependence, unspecified, uncomplicated: Secondary | ICD-10-CM | POA: Insufficient documentation

## 2013-09-21 MED ORDER — AMOXICILLIN 500 MG PO CAPS: 500.0000 mg | ORAL_CAPSULE | Freq: Three times a day (TID) | ORAL | Status: DC

## 2013-09-21 MED ORDER — HYDROCODONE-ACETAMINOPHEN 5-325 MG PO TABS: 1.0000 | ORAL_TABLET | ORAL | Status: DC | PRN

## 2013-09-21 NOTE — ED Provider Notes (Signed)
CSN: [id number]     Arrival date & time 09/21/13  1542 History   First MD Initiated Contact with Patient 09/21/13 1601     Chief Complaint  Patient presents with  . Dental Pain   (Consider location/radiation/quality/duration/timing/severity/associated sxs/prior Treatment) HPI  Patient presents to the emergency department with a dental complaint. Symptoms began 2  Days ago. The patient has tried to alleviate pain with OTC pain medication.  Pain rated at a 10/10, characterized as throbbing in nature and located left upper molar. Patient denies fever, night sweats, chills, difficulty swallowing or opening mouth, SOB, nuchal rigidity or decreased ROM of neck.  Patient does not have a dentist and requests a resource guide at discharge.   Past Medical History  Diagnosis Date  . Hypertension   . Hyperlipemia   . Bipolar 2 disorder   . Hypogonadism male   . Thrombocytopenia, unspecified 06/17/2013   History reviewed. No pertinent past surgical history. Family History  Problem Relation Age of Onset  . Cancer Brother     abdominal CA   History  Substance Use Topics  . Smoking status: Current Every Day Smoker -- 1.00 packs/day for 0 years  . Smokeless tobacco: Not on file  . Alcohol Use: No    Review of Systems The patient denies anorexia, fever, weight loss,, vision loss, decreased hearing, hoarseness, chest pain, syncope, dyspnea on exertion, peripheral edema, balance deficits, hemoptysis, abdominal pain, melena, hematochezia, severe indigestion/heartburn, hematuria, incontinence, genital sores, muscle weakness, suspicious skin lesions, transient blindness, difficulty walking, depression, unusual weight change, abnormal bleeding, enlarged lymph nodes, angioedema, and breast masses.  Allergies  Review of patient's allergies indicates no known allergies.  Home Medications   Current Outpatient Rx  Name  Route  Sig  Dispense  Refill  . amLODipine (NORVASC) 10 MG tablet   Oral   Take  10 mg by mouth daily.         . divalproex (DEPAKOTE ER) 500 MG 24 hr tablet   Oral   Take 1,000 mg by mouth at bedtime.         Marland Kitchen lisinopril (PRINIVIL,ZESTRIL) 40 MG tablet   Oral   Take 40 mg by mouth daily.         . metFORMIN (GLUCOPHAGE) 500 MG tablet   Oral   Take 500 mg by mouth daily.         . pravastatin (PRAVACHOL) 80 MG tablet   Oral   Take 80 mg by mouth daily.         . risperidone (RISPERDAL) 4 MG tablet   Oral   Take 4 mg by mouth 2 (two) times daily.         Marland Kitchen testosterone cypionate (DEPOTESTOTERONE CYPIONATE) 200 MG/ML injection   Intramuscular   Inject 200 mg into the muscle every 14 (fourteen) days.          BP 162/93  Pulse 66  Temp(Src) 98.4 F (36.9 C) (Oral)  Resp 20  SpO2 95% Physical Exam  Nursing note and vitals reviewed. Constitutional: He appears well-developed and well-nourished.  HENT:  Head: Normocephalic and atraumatic.  Mouth/Throat: Oropharynx is clear and moist. No trismus in the jaw. Dental caries present. No dental abscesses.    Eyes: Conjunctivae and EOM are normal. Pupils are equal, round, and reactive to light.  Neck: Normal range of motion. Neck supple.  Cardiovascular: Normal rate and regular rhythm.   Pulmonary/Chest: Effort normal and breath sounds normal.    ED Course  Procedures (  including critical care time) Labs Review Labs Reviewed - No data to display Imaging Review No results found.  EKG Interpretation   None       MDM   1. Pain, dental      Patient has dental pain. No emergent s/sx's present. Patent airway. No trismus.  Will be given pain medication and antibiotics. I discussed the need to call dentist within 24/48 hours for follow-up. Dental referral given. Return to ED precautions given.  Pt voiced understanding and has agreed to follow-up.      Linus Mako, PA-C 09/21/13 1611

## 2013-09-21 NOTE — ED Notes (Signed)
Presents with left upper tooth pain began 2 days ago, multiple dental caries noted.

## 2013-09-21 NOTE — Discharge Instructions (Signed)

## 2013-09-22 NOTE — ED Provider Notes (Signed)
Medical screening examination/treatment/procedure(s) were performed by non-physician practitioner and as supervising physician I was immediately available for consultation/collaboration.     Veryl Speak, MD 09/22/13 [phone]

## 2013-11-12 ENCOUNTER — Emergency Department (HOSPITAL_COMMUNITY)
Admission: EM | Admit: 2013-11-12 | Discharge: 2013-11-12 | Disposition: A | Discharge status: Home / Self Care | Payer: Medicare Other | Attending: Emergency Medicine | Admitting: Emergency Medicine

## 2013-11-12 ENCOUNTER — Encounter (HOSPITAL_COMMUNITY): Payer: Self-pay | Admitting: Emergency Medicine

## 2013-11-12 ENCOUNTER — Emergency Department (HOSPITAL_COMMUNITY): Payer: Medicare Other

## 2013-11-12 DIAGNOSIS — I10 Essential (primary) hypertension: Secondary | ICD-10-CM | POA: Insufficient documentation

## 2013-11-12 DIAGNOSIS — Z862 Personal history of diseases of the blood and blood-forming organs and certain disorders involving the immune mechanism: Secondary | ICD-10-CM | POA: Insufficient documentation

## 2013-11-12 DIAGNOSIS — F319 Bipolar disorder, unspecified: Secondary | ICD-10-CM | POA: Insufficient documentation

## 2013-11-12 DIAGNOSIS — K5900 Constipation, unspecified: Secondary | ICD-10-CM

## 2013-11-12 DIAGNOSIS — Z87448 Personal history of other diseases of urinary system: Secondary | ICD-10-CM | POA: Insufficient documentation

## 2013-11-12 DIAGNOSIS — E119 Type 2 diabetes mellitus without complications: Secondary | ICD-10-CM | POA: Insufficient documentation

## 2013-11-12 DIAGNOSIS — Z79899 Other long term (current) drug therapy: Secondary | ICD-10-CM | POA: Insufficient documentation

## 2013-11-12 DIAGNOSIS — F17200 Nicotine dependence, unspecified, uncomplicated: Secondary | ICD-10-CM | POA: Insufficient documentation

## 2013-11-12 DIAGNOSIS — E785 Hyperlipidemia, unspecified: Secondary | ICD-10-CM | POA: Insufficient documentation

## 2013-11-12 HISTORY — DX: Type 2 diabetes mellitus without complications: E11.9

## 2013-11-12 LAB — CBG MONITORING, ED: Glucose-Capillary: 77 mg/dL (ref 70–99)

## 2013-11-12 MED ORDER — POLYETHYLENE GLYCOL 3350 17 G PO PACK: 17.0000 g | PACK | Freq: Every day | ORAL | Status: DC

## 2013-11-12 MED ORDER — FLEET ENEMA 7-19 GM/118ML RE ENEM
1.0000 | ENEMA | Freq: Once | RECTAL | Status: AC
  Administered 2013-11-12: 1 via RECTAL
  Filled 2013-11-12: qty 1

## 2013-11-12 NOTE — ED Provider Notes (Signed)
CSN: [id number]     Arrival date & time 11/12/13  1302 History   First MD Initiated Contact with Patient 11/12/13 1441     Chief Complaint  Patient presents with  . Constipation     (Consider location/radiation/quality/duration/timing/severity/associated sxs/prior Treatment) The history is provided by the patient.  Bradley [name] is a 63 y.o. male hx of HTN, HL, bipolar, DM here with constipation. He has been constipated for the last 10 days. He usually has bowel movements daily. Noticed that his abdomen is more distended but denies any vomiting and is still passing gas. Denies any previous surgeries. Denies any fevers or chills. Denies any urinary symptoms and has not been taking anything for it.    Past Medical History  Diagnosis Date  . Hypertension   . Hyperlipemia   . Bipolar 2 disorder   . Hypogonadism male   . Thrombocytopenia, unspecified 06/17/2013  . Diabetes mellitus without complication    History reviewed. No pertinent past surgical history. Family History  Problem Relation Age of Onset  . Cancer Brother     abdominal CA   History  Substance Use Topics  . Smoking status: Current Every Day Smoker -- 1.00 packs/day for 0 years  . Smokeless tobacco: Not on file  . Alcohol Use: No    Review of Systems  Gastrointestinal: Positive for constipation.  All other systems reviewed and are negative.      Allergies  Review of patient's allergies indicates no known allergies.  Home Medications   Current Outpatient Rx  Name  Route  Sig  Dispense  Refill  . Acetaminophen (EXTRA STRENGTH PAIN RELIEF PO)   Oral   Take 1 tablet by mouth every 6 (six) hours as needed (pain).         Marland Kitchen amLODipine (NORVASC) 10 MG tablet   Oral   Take 10 mg by mouth daily.         . divalproex (DEPAKOTE ER) 500 MG 24 hr tablet   Oral   Take 1,000 mg by mouth at bedtime.         Marland Kitchen HYDROcodone-acetaminophen (NORCO/VICODIN) 5-325 MG per tablet   Oral   Take 1 tablet by  mouth every 4 (four) hours as needed for moderate pain.         Marland Kitchen lisinopril (PRINIVIL,ZESTRIL) 40 MG tablet   Oral   Take 40 mg by mouth daily.         . metFORMIN (GLUCOPHAGE) 500 MG tablet   Oral   Take 500 mg by mouth daily.         . pravastatin (PRAVACHOL) 80 MG tablet   Oral   Take 80 mg by mouth daily.         . risperidone (RISPERDAL) 4 MG tablet   Oral   Take 4 mg by mouth 2 (two) times daily.         Marland Kitchen testosterone cypionate (DEPOTESTOTERONE CYPIONATE) 200 MG/ML injection   Intramuscular   Inject 200 mg into the muscle every 6 (six) weeks.           BP 114/44  Pulse 60  Temp(Src) 98.1 F (36.7 C) (Oral)  Resp 18  Ht 5\' 8"  (1.727 m)  Wt 232 lb 6.4 oz (105.416 kg)  BMI 35.34 kg/m2  SpO2 95% Physical Exam  Nursing note and vitals reviewed. Constitutional: He is oriented to person, place, and time. He appears well-developed and well-nourished.  HENT:  Head: Normocephalic.  Mouth/Throat: Oropharynx is clear and  moist.  Eyes: Conjunctivae are normal. Pupils are equal, round, and reactive to light.  Neck: Normal range of motion. Neck supple.  Cardiovascular: Normal rate, regular rhythm and normal heart sounds.   Pulmonary/Chest: Effort normal and breath sounds normal. No respiratory distress. He has no wheezes. He has no rales.  Abdominal:  Distended, nontender.   Genitourinary:  Rectal- no hemorrhoids. No stool impaction.   Musculoskeletal: Normal range of motion. He exhibits no edema and no tenderness.  Neurological: He is alert and oriented to person, place, and time. No cranial nerve deficit. Coordination normal.  Skin: Skin is warm and dry.  Psychiatric: He has a normal mood and affect. His behavior is normal. Judgment and thought content normal.    ED Course  Procedures (including critical care time) Labs Review Labs Reviewed  CBG MONITORING, ED   Imaging Review Dg Abd 2 Views  11/12/2013   CLINICAL DATA:  Constipation for 10 days.   EXAM: ABDOMEN - 2 VIEW  COMPARISON:  CT ABD/PELVIS W CM dated 11/13/2011  FINDINGS: Moderate amount of retained large bowel stool. Bowel gas pattern is nondilated and nonobstructive. No intra-abdominal mass effect, pathologic calcifications or free air. Soft tissue planes and included osseous structures are nonsuspicious.  IMPRESSION: Nonobstructive bowel gas pattern with moderate amount of retained large bowel stool.   Electronically Signed   By: Elon Alas   On: 11/12/2013 15:51     EKG Interpretation None      MDM   Final diagnoses:  None   Bradley [name] is a 63 y.o. male here with constipation. Not impacted. Will get xray to assess for extent of constipation and to r/o SBO. Will give enema and reassess.   4:43 PM Xray showed constipation, no SBO. Given enema with good relief. Will d/c home on miralax.   Wandra Arthurs, MD 11/12/13 [phone]

## 2013-11-12 NOTE — Discharge Instructions (Signed)
Use miralax 3 times a day until you have a normal bowel movement then daily.   Follow up with a doctor.   Stay hydrated.   Return to ER if you have severe abdominal pain, vomiting.

## 2013-11-12 NOTE — ED Notes (Signed)
PT states he has not had a BM in 10 days (normally has BM q daily)

## 2013-11-16 ENCOUNTER — Telehealth: Payer: Self-pay | Admitting: Internal Medicine

## 2013-11-16 ENCOUNTER — Encounter: Payer: Self-pay | Admitting: Internal Medicine

## 2013-11-16 NOTE — Telephone Encounter (Signed)
Pt states he has not had a BM in 5 days. States he has taken miralax and it has not helped. Pt instructed to try a bottle of Magnesium Citrate. Pt verbalized understanding.

## 2013-12-16 ENCOUNTER — Ambulatory Visit (AMBULATORY_SURGERY_CENTER): Payer: Self-pay | Admitting: *Deleted

## 2013-12-16 VITALS — Ht 68.0 in | Wt 227.4 lb

## 2013-12-16 DIAGNOSIS — Z86010 Personal history of colonic polyps: Secondary | ICD-10-CM

## 2013-12-16 MED ORDER — MOVIPREP 100 G PO SOLR: ORAL | Status: DC

## 2013-12-16 NOTE — Progress Notes (Signed)
Patient denies any allergies to eggs or soy. Patient denies any problems with anesthesia/sedation. Pt denies any oxygen use at home. Does not take diet/weight loss medications. Patient has history of constipation, was seen in ER 11/12/2013. Patient denies any current GI problems, denies constipation at this time. Patient states he does have a BM every day. Denies taking any medications for this and denies taking pain medications at this time. Explained the importance of drinking the Moviprep correctly to get clean for colonoscopy. Pt. verbalizes understanding.

## 2013-12-18 ENCOUNTER — Encounter: Payer: Self-pay | Admitting: Internal Medicine

## 2013-12-30 ENCOUNTER — Telehealth: Payer: Self-pay | Admitting: Hematology and Oncology

## 2013-12-30 ENCOUNTER — Ambulatory Visit (AMBULATORY_SURGERY_CENTER): Payer: Medicare Other | Admitting: Internal Medicine

## 2013-12-30 ENCOUNTER — Encounter: Payer: Self-pay | Admitting: Internal Medicine

## 2013-12-30 VITALS — BP 150/93 | HR 60 | Temp 96.9°F | Resp 34 | Ht 68.0 in | Wt 227.0 lb

## 2013-12-30 DIAGNOSIS — D126 Benign neoplasm of colon, unspecified: Secondary | ICD-10-CM

## 2013-12-30 DIAGNOSIS — Z86010 Personal history of colonic polyps: Secondary | ICD-10-CM

## 2013-12-30 LAB — GLUCOSE, CAPILLARY
Glucose-Capillary: 85 mg/dL (ref 70–99)
Glucose-Capillary: 111 mg/dL — ABNORMAL HIGH (ref 70–99)
Glucose-Capillary: 115 mg/dL — ABNORMAL HIGH (ref 70–99)

## 2013-12-30 MED ORDER — SODIUM CHLORIDE 0.9 % IV SOLN: 500.0000 mL | INTRAVENOUS | Status: DC

## 2013-12-30 NOTE — Op Note (Signed)
[address]  [employer] [place], [address]   COLONOSCOPY PROCEDURE REPORT  PATIENT: Bradley [name], Bradley [name]  MR#: [id number] BIRTHDATE: 25-Dec-1950 , 62  yrs. old GENDER: Male ENDOSCOPIST: Eustace Quail, MD REFERRED [id number] Program Recall PROCEDURE DATE:  12/30/2013 PROCEDURE:   Colonoscopy with snare polypectomy x2 First Screening Colonoscopy - Avg.  risk and is 50 yrs.  old or older - No.  Prior Negative Screening - Now for repeat screening. N/A  History of Adenoma - Now for follow-up colonoscopy & has been > or = to 3 yrs.  Yes hx of adenoma.  Has been 3 or more years since last colonoscopy.  Polyps Removed Today? Yes. ASA CLASS:   Class II INDICATIONS:Patient's personal history of adenomatous colon polyps. Index exam July 2009-small adenomas MEDICATIONS: MAC sedation, administered by CRNA and propofol (Diprivan) 280mg  IV  DESCRIPTION OF PROCEDURE:   After the risks benefits and alternatives of the procedure were thoroughly explained, informed consent was obtained.  A digital rectal exam revealed no abnormalities of the rectum.   The LB [id number]  endoscope was introduced through the anus and advanced to the cecum, which was identified by both the appendix and ileocecal valve. No adverse events experienced.   The quality of the prep was good, using MoviPrep  The instrument was then slowly withdrawn as the colon was fully examined.  COLON FINDINGS: Severe diverticulosis was noted throughout the colon. Sigmoid stenosis present.   Two diminutive polyps were found at the cecum and ascending colon.  A polypectomy was performed with a cold snare.  The resection was complete and the polyp tissue was completely retrieved.   The colon mucosa was otherwise normal. Retroflexed views revealed internal hemorrhoids. The time to cecum=5 min 25 sec.  Withdrawal time=10 min 06 sec.  The scope was withdrawn and the procedure completed. COMPLICATIONS: There  were no complications.  ENDOSCOPIC IMPRESSION: 1.   Severe diverticulosis was noted throughout the entire examined colon . Sigmoid stenosis present 2.   Two diminutive polyps were found in the colon; polypectomy was performed with a cold snare 3.   The colon mucosa was otherwise normal  RECOMMENDATIONS: 1. Repeat colonoscopy in 5 years if polyp adenomatous; otherwise 10 years   eSigned:  Eustace Quail, MD 12/30/2013 11:26 AM   cc: Benito Mccreedy MD and The Patient

## 2013-12-30 NOTE — Progress Notes (Signed)
Called to room to assist during endoscopic procedure.  Patient ID and intended procedure confirmed with present staff. Received instructions for my participation in the procedure from the performing physician.  

## 2013-12-30 NOTE — Telephone Encounter (Signed)
pt called and r/s appt, nurse notified

## 2013-12-30 NOTE — Progress Notes (Signed)
Pt had bs of 85 in admitting, hung d5, rechecked 1045am bs 111, left D5 piggy backed into NS but turned off, recorded 190ml that went in while in admitting-adm

## 2013-12-30 NOTE — Patient Instructions (Signed)
Discharge instructions given with verbal understanding. Handouts on polyps and diverticulosis. Resume previous medications. YOU HAD AN ENDOSCOPIC PROCEDURE TODAY AT THE [facility]: Refer to the procedure report that was given to you for any specific questions about what was found during the examination.  If the procedure report does not answer your questions, please call your gastroenterologist to clarify.  If you requested that your care partner not be given the details of your procedure findings, then the procedure report has been included in a sealed envelope for you to review at your convenience later.  YOU SHOULD EXPECT: Some feelings of bloating in the abdomen. Passage of more gas than usual.  Walking can help get rid of the air that was put into your GI tract during the procedure and reduce the bloating. If you had a lower endoscopy (such as a colonoscopy or flexible sigmoidoscopy) you may notice spotting of blood in your stool or on the toilet paper. If you underwent a bowel prep for your procedure, then you may not have a normal bowel movement for a few days.  DIET: Your first meal following the procedure should be a light meal and then it is ok to progress to your normal diet.  A half-sandwich or bowl of soup is an example of a good first meal.  Heavy or fried foods are harder to digest and may make you feel nauseous or bloated.  Likewise meals heavy in dairy and vegetables can cause extra gas to form and this can also increase the bloating.  Drink plenty of fluids but you should avoid alcoholic beverages for 24 hours.  ACTIVITY: Your care partner should take you home directly after the procedure.  You should plan to take it easy, moving slowly for the rest of the day.  You can resume normal activity the day after the procedure however you should NOT DRIVE or use heavy machinery for 24 hours (because of the sedation medicines used during the test).    SYMPTOMS TO REPORT  IMMEDIATELY: A gastroenterologist can be reached at any hour.  During normal business hours, 8:30 AM to 5:00 PM Monday through Friday, call [phone].  After hours and on weekends, please call the GI answering service at [phone] who will take a message and have the physician on call contact you.   Following lower endoscopy (colonoscopy or flexible sigmoidoscopy):  Excessive amounts of blood in the stool  Significant tenderness or worsening of abdominal pains  Swelling of the abdomen that is new, acute  Fever of 100F or higher  FOLLOW UP: If any biopsies were taken you will be contacted by phone or by letter within the next 1-3 weeks.  Call your gastroenterologist if you have not heard about the biopsies in 3 weeks.  Our staff will call the home number listed on your records the next business day following your procedure to check on you and address any questions or concerns that you may have at that time regarding the information given to you following your procedure. This is a courtesy call and so if there is no answer at the home number and we have not heard from you through the emergency physician on call, we will assume that you have returned to your regular daily activities without incident.  SIGNATURES/CONFIDENTIALITY: You and/or your care partner have signed paperwork which will be entered into your electronic medical record.  These signatures attest to the fact that that the information above on your After Visit Summary   has been reviewed and is understood.  Full responsibility of the confidentiality of this discharge information lies with you and/or your care-partner. 

## 2013-12-30 NOTE — Progress Notes (Signed)
A/ox3 pleased with MAC, report to Celia RN 

## 2013-12-31 ENCOUNTER — Other Ambulatory Visit: Payer: Medicare Other

## 2013-12-31 ENCOUNTER — Telehealth: Payer: Self-pay

## 2013-12-31 ENCOUNTER — Ambulatory Visit: Payer: Medicare Other | Admitting: Hematology and Oncology

## 2013-12-31 NOTE — Telephone Encounter (Signed)
  Follow up Call-  Call back number 12/30/2013  Post procedure Call Back phone  # [phone]  Permission to leave phone message Yes     Patient questions:  Do you have a fever, pain , or abdominal swelling? no Pain Score  0 *  Have you tolerated food without any problems? yes  Have you been able to return to your normal activities? yes  Do you have any questions about your discharge instructions: Diet   no Medications  no Follow up visit  no  Do you have questions or concerns about your Care? no  Actions: * If pain score is 4 or above: No action needed, pain <4.

## 2014-01-04 ENCOUNTER — Encounter: Payer: Self-pay | Admitting: Internal Medicine

## 2014-01-22 ENCOUNTER — Encounter: Payer: Self-pay | Admitting: Hematology and Oncology

## 2014-01-22 ENCOUNTER — Ambulatory Visit (HOSPITAL_BASED_OUTPATIENT_CLINIC_OR_DEPARTMENT_OTHER): Payer: Medicare Other | Admitting: Hematology and Oncology

## 2014-01-22 ENCOUNTER — Telehealth: Payer: Self-pay | Admitting: Hematology and Oncology

## 2014-01-22 ENCOUNTER — Other Ambulatory Visit (HOSPITAL_BASED_OUTPATIENT_CLINIC_OR_DEPARTMENT_OTHER): Payer: Medicare Other

## 2014-01-22 VITALS — BP 140/80 | HR 78 | Temp 97.9°F | Resp 20 | Ht 68.0 in | Wt 228.9 lb

## 2014-01-22 DIAGNOSIS — D696 Thrombocytopenia, unspecified: Secondary | ICD-10-CM

## 2014-01-22 DIAGNOSIS — F17200 Nicotine dependence, unspecified, uncomplicated: Secondary | ICD-10-CM

## 2014-01-22 DIAGNOSIS — N529 Male erectile dysfunction, unspecified: Secondary | ICD-10-CM

## 2014-01-22 DIAGNOSIS — Z720 Tobacco use: Secondary | ICD-10-CM

## 2014-01-22 LAB — CBC WITH DIFFERENTIAL/PLATELET
BASO%: 0.4 % (ref 0.0–2.0)
Basophils Absolute: 0 10*3/uL (ref 0.0–0.1)
EOS ABS: 0.1 10*3/uL (ref 0.0–0.5)
EOS%: 1.4 % (ref 0.0–7.0)
HEMATOCRIT: 43.8 % (ref 38.4–49.9)
HEMOGLOBIN: 14.2 g/dL (ref 13.0–17.1)
LYMPH%: 33.6 % (ref 14.0–49.0)
MCH: 29.2 pg (ref 27.2–33.4)
MCHC: 32.5 g/dL (ref 32.0–36.0)
MCV: 89.8 fL (ref 79.3–98.0)
MONO#: 0.8 10*3/uL (ref 0.1–0.9)
MONO%: 12.3 % (ref 0.0–14.0)
NEUT#: 3.5 10*3/uL (ref 1.5–6.5)
NEUT%: 52.3 % (ref 39.0–75.0)
Platelets: 139 10*3/uL — ABNORMAL LOW (ref 140–400)
RBC: 4.88 10*6/uL (ref 4.20–5.82)
RDW: 15.2 % — ABNORMAL HIGH (ref 11.0–14.6)
WBC: 6.6 10*3/uL (ref 4.0–10.3)
lymph#: 2.2 10*3/uL (ref 0.9–3.3)

## 2014-01-22 NOTE — Progress Notes (Signed)
[place], MD DIAGNOSIS:  Thrombocytopenia likely due to Depakote  SUMMARY OF HEMATOLOGIC HISTORY: his is a pleasant 63 year old gentleman with background history of bipolar disorder was found to have thrombocytopenia on routine CBC. INTERVAL HISTORY: Bradley [name] 63 y.o. male returns for followup on thrombocytopenia. The patient denies any recent signs or symptoms of bleeding such as spontaneous epistaxis, hematuria or hematochezia. He complained of erectile dysfunction. I have reviewed the past medical history, past surgical history, social history and family history with the patient and they are unchanged from previous note.  ALLERGIES:  has No Known Allergies.  MEDICATIONS:  Current Outpatient Prescriptions  Medication Sig Dispense Refill  . Acetaminophen (EXTRA STRENGTH PAIN RELIEF PO) Take 1 tablet by mouth every 6 (six) hours as needed (pain).      Marland Kitchen amLODipine (NORVASC) 10 MG tablet Take 10 mg by mouth daily.      . divalproex (DEPAKOTE ER) 500 MG 24 hr tablet Take 1,000 mg by mouth at bedtime.      Marland Kitchen HYDROcodone-acetaminophen (NORCO/VICODIN) 5-325 MG per tablet Take 1 tablet by mouth every 4 (four) hours as needed for moderate pain.      Marland Kitchen lisinopril (PRINIVIL,ZESTRIL) 40 MG tablet Take 40 mg by mouth daily.      . metFORMIN (GLUCOPHAGE) 500 MG tablet Take 500 mg by mouth daily.      . polyethylene glycol (MIRALAX / GLYCOLAX) packet Take 17 g by mouth daily.  14 each  0  . pravastatin (PRAVACHOL) 80 MG tablet Take 80 mg by mouth daily.      . risperidone (RISPERDAL) 4 MG tablet Take 4 mg by mouth 2 (two) times daily.      Marland Kitchen testosterone cypionate (DEPOTESTOTERONE CYPIONATE) 200 MG/ML injection Inject 200 mg into the muscle every 6 (six) weeks.        No current facility-administered medications for this visit.     REVIEW OF SYSTEMS:   Constitutional: Denies fevers, chills or night sweats Eyes: Denies blurriness  of vision Ears, nose, mouth, throat, and face: Denies mucositis or sore throat Respiratory: Denies cough, dyspnea or wheezes Cardiovascular: Denies palpitation, chest discomfort or lower extremity swelling Gastrointestinal:  Denies nausea, heartburn or change in bowel habits Skin: Denies abnormal skin rashes Lymphatics: Denies new lymphadenopathy or easy bruising Neurological:Denies numbness, tingling or new weaknesses Behavioral/Psych: Mood is stable, no new changes  All other systems were reviewed with the patient and are negative.  PHYSICAL EXAMINATION: ECOG PERFORMANCE STATUS: 0 - Asymptomatic  Filed Vitals:   01/22/14 1259  BP: 140/80  Pulse: 78  Temp: 97.9 F (36.6 C)  Resp: 20   Filed Weights   01/22/14 1259  Weight: 228 lb 14.4 oz (103.828 kg)    GENERAL:alert, no distress and comfortable SKIN: skin color, texture, turgor are normal, no rashes or significant lesions EYES: normal, Conjunctiva are pink and non-injected, sclera clear Musculoskeletal:no cyanosis of digits and no clubbing  NEURO: alert & oriented x 3 with fluent speech, no focal motor/sensory deficits  LABORATORY DATA:  I have reviewed the data as listed Results for orders placed in visit on 01/22/14 (from the past 48 hour(s))  CBC WITH DIFFERENTIAL     Status: Abnormal   Collection Time    01/22/14 12:46 PM      Result Value Ref Range   WBC 6.6  4.0 - 10.3 10e3/uL   NEUT# 3.5  1.5 - 6.5 10e3/uL   HGB 14.2  13.0 -  17.1 g/dL   HCT 43.8  38.4 - 49.9 %   Platelets 139 (*) 140 - 400 10e3/uL   MCV 89.8  79.3 - 98.0 fL   MCH 29.2  27.2 - 33.4 pg   MCHC 32.5  32.0 - 36.0 g/dL   RBC 4.88  4.20 - 5.82 10e6/uL   RDW 15.2 (*) 11.0 - 14.6 %   lymph# 2.2  0.9 - 3.3 10e3/uL   MONO# 0.8  0.1 - 0.9 10e3/uL   Eosinophils Absolute 0.1  0.0 - 0.5 10e3/uL   Basophils Absolute 0.0  0.0 - 0.1 10e3/uL   NEUT% 52.3  39.0 - 75.0 %   LYMPH% 33.6  14.0 - 49.0 %   MONO% 12.3  0.0 - 14.0 %   EOS% 1.4  0.0 - 7.0 %    BASO% 0.4  0.0 - 2.0 %    Lab Results  Component Value Date   WBC 6.6 01/22/2014   HGB 14.2 01/22/2014   HCT 43.8 01/22/2014   MCV 89.8 01/22/2014   PLT 139* 01/22/2014   ASSESSMENT & PLAN:  Thrombocytopenia, unspecified This fluctuate up and down in the past. The patient is on Depakote which can cause mild thrombocytopenia. I reassured the patient and he does not need to return.  Erectile dysfunction He was started on testosterone injection by his primary care provider. The patient requests referral to a specialist to treat erectile dysfunction. I will refer him to a urologist.  Tobacco abuse I spent some time counseling the patient the importance of tobacco cessation. he is currently attempting to quit on his own     All questions were answered. The patient knows to call the clinic with any problems, questions or concerns. No barriers to learning was detected.  I spent 15 minutes counseling the patient face to face. The total time spent in the appointment was 20 minutes and more than 50% was on counseling.     Heath Lark, MD 01/22/2014 1:27 PM

## 2014-01-22 NOTE — Assessment & Plan Note (Signed)
This fluctuate up and down in the past. The patient is on Depakote which can cause mild thrombocytopenia. I reassured the patient and he does not need to return.

## 2014-01-22 NOTE — Assessment & Plan Note (Signed)
I spent some time counseling the patient the importance of tobacco cessation. he is currently attempting to quit on his own 

## 2014-01-22 NOTE — Telephone Encounter (Signed)
gv adn printed appt sched for pt appt with urology 6.23 @ 1:15pm

## 2014-01-22 NOTE — Assessment & Plan Note (Signed)
He was started on testosterone injection by his primary care provider. The patient requests referral to a specialist to treat erectile dysfunction. I will refer him to a urologist.

## 2014-02-09 ENCOUNTER — Telehealth: Payer: Self-pay | Admitting: Hematology and Oncology

## 2014-02-09 NOTE — Telephone Encounter (Signed)
Faxed pt medical records to [phone] °

## 2014-03-27 ENCOUNTER — Encounter (HOSPITAL_COMMUNITY): Payer: Self-pay | Admitting: Emergency Medicine

## 2014-03-27 ENCOUNTER — Emergency Department (HOSPITAL_COMMUNITY)
Admission: EM | Admit: 2014-03-27 | Discharge: 2014-03-27 | Disposition: A | Discharge status: Home / Self Care | Payer: Medicare Other | Attending: Emergency Medicine | Admitting: Emergency Medicine

## 2014-03-27 DIAGNOSIS — Z79899 Other long term (current) drug therapy: Secondary | ICD-10-CM | POA: Insufficient documentation

## 2014-03-27 DIAGNOSIS — E785 Hyperlipidemia, unspecified: Secondary | ICD-10-CM | POA: Diagnosis not present

## 2014-03-27 DIAGNOSIS — K089 Disorder of teeth and supporting structures, unspecified: Secondary | ICD-10-CM | POA: Diagnosis present

## 2014-03-27 DIAGNOSIS — E119 Type 2 diabetes mellitus without complications: Secondary | ICD-10-CM | POA: Insufficient documentation

## 2014-03-27 DIAGNOSIS — I10 Essential (primary) hypertension: Secondary | ICD-10-CM | POA: Insufficient documentation

## 2014-03-27 DIAGNOSIS — F3189 Other bipolar disorder: Secondary | ICD-10-CM | POA: Diagnosis not present

## 2014-03-27 DIAGNOSIS — Z862 Personal history of diseases of the blood and blood-forming organs and certain disorders involving the immune mechanism: Secondary | ICD-10-CM | POA: Diagnosis not present

## 2014-03-27 DIAGNOSIS — F17200 Nicotine dependence, unspecified, uncomplicated: Secondary | ICD-10-CM | POA: Insufficient documentation

## 2014-03-27 DIAGNOSIS — K029 Dental caries, unspecified: Secondary | ICD-10-CM | POA: Diagnosis not present

## 2014-03-27 DIAGNOSIS — K0889 Other specified disorders of teeth and supporting structures: Secondary | ICD-10-CM

## 2014-03-27 MED ORDER — PENICILLIN V POTASSIUM 250 MG PO TABS
500.0000 mg | ORAL_TABLET | Freq: Once | ORAL | Status: AC
  Administered 2014-03-27: 500 mg via ORAL
  Filled 2014-03-27: qty 2

## 2014-03-27 MED ORDER — NAPROXEN 500 MG PO TABS: 500.0000 mg | ORAL_TABLET | Freq: Two times a day (BID) | ORAL | Status: DC

## 2014-03-27 MED ORDER — PENICILLIN V POTASSIUM 500 MG PO TABS: 500.0000 mg | ORAL_TABLET | Freq: Three times a day (TID) | ORAL | Status: DC

## 2014-03-27 MED ORDER — HYDROCODONE-ACETAMINOPHEN 5-325 MG PO TABS: ORAL_TABLET | ORAL | Status: DC

## 2014-03-27 NOTE — ED Notes (Signed)
Pt c/o right upper tooth pain that started Thursday. Pt stated that he has not had his medications since because of pain.

## 2014-03-27 NOTE — Discharge Instructions (Signed)
Please read and follow all provided instructions.  Your diagnoses today include:  1. Pain, dental    The exam and treatment you received today has been provided on an emergency basis only. This is not a substitute for complete medical or dental care.  Tests performed today include:  Vital signs. See below for your results today.   Medications prescribed:   Penicillin - antibiotic  You have been prescribed an antibiotic medicine: take the entire course of medicine even if you are feeling better. Stopping early can cause the antibiotic not to work.   Vicodin (hydrocodone/acetaminophen) - narcotic pain medication  DO NOT drive or perform any activities that require you to be awake and alert because this medicine can make you drowsy. BE VERY CAREFUL not to take multiple medicines containing Tylenol (also called acetaminophen). Doing so can lead to an overdose which can damage your liver and cause liver failure and possibly death.   Naproxen - anti-inflammatory pain medication  Do not exceed 500mg  naproxen every 12 hours, take with food  You have been prescribed an anti-inflammatory medication or NSAID. Take with food. Take smallest effective dose for the shortest duration needed for your pain. Stop taking if you experience stomach pain or vomiting.   Take any prescribed medications only as directed.  Home care instructions:  Follow any educational materials contained in this packet.  Follow-up instructions: Please follow-up with your dentist for further evaluation of your symptoms.   Dental Assistance: See below for dental referrals  Return instructions:   Please return to the Emergency Department if you experience worsening symptoms.  Please return if you develop a fever, you develop more swelling in your face or neck, you have trouble breathing or swallowing food.  Please return if you have any other emergent concerns.  Additional Information:  Your vital signs today  were: BP 161/93   Pulse 85   Temp(Src) 98.9 F (37.2 C) (Oral)   Resp 20   Ht 5\' 8"  (1.727 m)   Wt 220 lb (99.791 kg)   BMI 33.46 kg/m2   SpO2 96% If your blood pressure (BP) was elevated above 135/85 this visit, please have this repeated by your doctor within one month. -------------- Dental Care: Organization         Address  Phone  Notes  [facility] Department of [facility] [place] [phone] Accepts children up to age 60 who are enrolled in [place] or [place]; pregnant women with a Medicaid card; and children who have applied for Medicaid or [employer] Choice, but were declined, whose parents can pay a reduced fee at time of service.  [facility] Department of [facility]  [address], [place] [phone] Accepts children up to age 106 who are enrolled in [place] or [place]; pregnant women with a Medicaid card; and children who have applied for Medicaid or [employer] Health Choice, but were declined, whose parents can pay a reduced fee at time of service.  [facility]  [place] [phone] Patients are seen by appointment only. Walk-ins are not accepted. [place] will see patients 43 years of age and older. Monday - Tuesday (8am-5pm) Most Wednesdays (8:30-5pm) $30 per visit, cash only  [facility]  [address], [facility] [phone] Patients are seen by appointment only. Walk-ins are not accepted. [place] will see  patients 31 years of age and older. One Wednesday Evening (Monthly: Volunteer Based).  $30 per visit, cash only  [place]  [phone] for adults; Children under age 87, call Graduate Pediatric Dentistry at [phone]. Children aged 13-14, please call [phone] to request a pediatric application.  Dental services are provided in all areas of dental  care including fillings, crowns and bridges, complete and partial dentures, implants, gum treatment, root canals, and extractions. Preventive care is also provided. Treatment is provided to both adults and children. Patients are selected via a lottery and there is often a waiting list.   [facility] [address], [place]  [phone] [email or web address]   [facility] [address] [place], [place] [phone], Ext. [phone] Second and Fourth Thursday of each month, opens at 6:30 AM; Clinic ends at 9 AM.  Patients are seen on a first-come first-served basis, and a limited number are seen during each clinic.   [facility]  [address] [place], [place] [phone]   Eligibility Requirements You must have lived in [place], [place], or [place] counties for at least the last three months.   You cannot be eligible for state or federal sponsored [employer], including [employer], [place], or [employer].   You generally cannot be eligible for healthcare insurance through your employer.    How to apply: Eligibility screenings are held every Tuesday and Wednesday afternoon from 1:00 pm until 4:00 pm. You do not need an appointment for the interview!  [facility] [address] [place], [place]   [place]  [place]  [place]  [phone]

## 2014-03-27 NOTE — ED Provider Notes (Signed)
Medical screening examination/treatment/procedure(s) were performed by non-physician practitioner and as supervising physician I was immediately available for consultation/collaboration.   EKG Interpretation None        Cayton Cuevas N Orlie Cundari, DO 03/27/14 0822 

## 2014-03-27 NOTE — ED Provider Notes (Signed)
CSN: [id number]     Arrival date & time 03/27/14  0714 History   First MD Initiated Contact with Patient 03/27/14 [phone]     Chief Complaint  Patient presents with  . Dental Pain     (Consider location/radiation/quality/duration/timing/severity/associated sxs/prior Treatment) HPI Comments: Patients with complaint of right upper dental pain that started 2 days ago. The patient has taken over-the-counter medication without relief. He denies neck pain, neck swelling, inability to swallow. No fever. The onset of this condition was acute. The course is constant. Alleviating factors: none.    Patient is a 63 y.o. male presenting with tooth pain. The history is provided by the patient.  Dental Pain Associated symptoms: no facial swelling, no fever, no headaches and no neck pain     Past Medical History  Diagnosis Date  . Hypertension   . Hyperlipemia   . Bipolar 2 disorder   . Hypogonadism male   . Thrombocytopenia, unspecified 06/17/2013  . Diabetes mellitus without complication    Past Surgical History  Procedure Laterality Date  . Colonoscopy     Family History  Problem Relation Age of Onset  . Cancer Brother     abdominal CA  . Colon cancer Neg Hx    History  Substance Use Topics  . Smoking status: Current Every Day Smoker -- 1.00 packs/day for 0 years    Types: Cigarettes  . Smokeless tobacco: Never Used  . Alcohol Use: No    Review of Systems  Constitutional: Negative for fever.  HENT: Positive for dental problem. Negative for ear pain, facial swelling, sore throat and trouble swallowing.   Respiratory: Negative for shortness of breath and stridor.   Musculoskeletal: Negative for neck pain.  Skin: Negative for color change.  Neurological: Negative for headaches.      Allergies  Review of patient's allergies indicates no known allergies.  Home Medications   Prior to Admission medications   Medication Sig Start Date End Date Taking? Authorizing Provider   amLODipine (NORVASC) 10 MG tablet Take 10 mg by mouth daily.   Yes Historical Provider, MD  divalproex (DEPAKOTE ER) 500 MG 24 hr tablet Take 1,000 mg by mouth at bedtime.   Yes Historical Provider, MD  lisinopril (PRINIVIL,ZESTRIL) 40 MG tablet Take 40 mg by mouth daily.   Yes Historical Provider, MD  metFORMIN (GLUCOPHAGE) 500 MG tablet Take 500 mg by mouth daily. 05/26/13  Yes Historical Provider, MD  pravastatin (PRAVACHOL) 80 MG tablet Take 80 mg by mouth daily.   Yes Historical Provider, MD  risperidone (RISPERDAL) 4 MG tablet Take 4 mg by mouth 2 (two) times daily.   Yes Historical Provider, MD  HYDROcodone-acetaminophen (NORCO/VICODIN) 5-325 MG per tablet Take 1-2 tablets every 6 hours as needed for severe pain 03/27/14   Carlisle Cater, PA-C  naproxen (NAPROSYN) 500 MG tablet Take 1 tablet (500 mg total) by mouth 2 (two) times daily. 03/27/14   Carlisle Cater, PA-C  penicillin v potassium (VEETID) 500 MG tablet Take 1 tablet (500 mg total) by mouth 3 (three) times daily. 03/27/14   Carlisle Cater, PA-C   BP 161/93  Pulse 85  Temp(Src) 98.9 F (37.2 C) (Oral)  Resp 20  Ht 5\' 8"  (1.727 m)  Wt 220 lb (99.791 kg)  BMI 33.46 kg/m2  SpO2 96% Physical Exam  Nursing note and vitals reviewed. Constitutional: He appears well-developed and well-nourished.  HENT:  Head: Normocephalic and atraumatic.  Right Ear: Tympanic membrane, external ear and ear canal normal.  Left  Ear: Tympanic membrane, external ear and ear canal normal.  Nose: Nose normal.  Mouth/Throat: Uvula is midline, oropharynx is clear and moist and mucous membranes are normal. No trismus in the jaw. Abnormal dentition. Dental caries present. No dental abscesses or uvula swelling. No tonsillar abscesses.  Patient with R maxillary tooth pain and tenderness to palpation in area of pre-molars. Mild swelling and erythema noted on exam. No gross abscess.   Eyes: Pupils are equal, round, and reactive to light.  Neck: Normal range of  motion. Neck supple.  No neck swelling or Lugwig's angina  Neurological: He is alert.  Skin: Skin is warm and dry.  Psychiatric: He has a normal mood and affect.    ED Course  Procedures (including critical care time) Labs Review Labs Reviewed - No data to display  Imaging Review No results found.   EKG Interpretation None      7:53 AM Patient seen and examined. Medications ordered.   Vital signs reviewed and are as follows: BP 161/93  Pulse 85  Temp(Src) 98.9 F (37.2 C) (Oral)  Resp 20  Ht 5\' 8"  (1.727 m)  Wt 220 lb (99.791 kg)  BMI 33.46 kg/m2  SpO2 96%  Patient counseled on use of narcotic pain medications. Counseled not to combine these medications with others containing tylenol. Urged not to drink alcohol, drive, or perform any other activities that requires focus while taking these medications. The patient verbalizes understanding and agrees with the plan.  Patient counseled to take prescribed medications as directed, return with worsening facial or neck swelling, and to follow-up with their dentist as soon as possible.    MDM   Final diagnoses:  Pain, dental   Patient with toothache. No fever. Exam unconcerning for Ludwig's angina or other deep tissue infection in neck.   As there is gum swelling, erythema, will treat with antibiotic and pain medicine. Urged patient to follow-up with dentist.        Carlisle Cater, PA-C 03/27/14 [phone]

## 2014-10-13 ENCOUNTER — Encounter (HOSPITAL_COMMUNITY): Payer: Self-pay | Admitting: *Deleted

## 2014-10-13 ENCOUNTER — Emergency Department (HOSPITAL_COMMUNITY): Payer: Medicare Other

## 2014-10-13 ENCOUNTER — Inpatient Hospital Stay (HOSPITAL_COMMUNITY)
Admission: EM | Admit: 2014-10-13 | Discharge: 2014-10-17 | DRG: 356 | Disposition: A | Discharge status: Home / Self Care | Payer: Medicare Other | Attending: Internal Medicine | Admitting: Internal Medicine

## 2014-10-13 DIAGNOSIS — F17210 Nicotine dependence, cigarettes, uncomplicated: Secondary | ICD-10-CM | POA: Diagnosis present

## 2014-10-13 DIAGNOSIS — E119 Type 2 diabetes mellitus without complications: Secondary | ICD-10-CM | POA: Diagnosis present

## 2014-10-13 DIAGNOSIS — K5720 Diverticulitis of large intestine with perforation and abscess without bleeding: Secondary | ICD-10-CM | POA: Diagnosis present

## 2014-10-13 DIAGNOSIS — K5792 Diverticulitis of intestine, part unspecified, without perforation or abscess without bleeding: Secondary | ICD-10-CM

## 2014-10-13 DIAGNOSIS — Z79899 Other long term (current) drug therapy: Secondary | ICD-10-CM | POA: Diagnosis not present

## 2014-10-13 DIAGNOSIS — I10 Essential (primary) hypertension: Secondary | ICD-10-CM | POA: Diagnosis present

## 2014-10-13 DIAGNOSIS — F3181 Bipolar II disorder: Secondary | ICD-10-CM

## 2014-10-13 DIAGNOSIS — K5900 Constipation, unspecified: Secondary | ICD-10-CM | POA: Diagnosis present

## 2014-10-13 DIAGNOSIS — Z720 Tobacco use: Secondary | ICD-10-CM

## 2014-10-13 DIAGNOSIS — R109 Unspecified abdominal pain: Secondary | ICD-10-CM | POA: Diagnosis present

## 2014-10-13 DIAGNOSIS — K6819 Other retroperitoneal abscess: Secondary | ICD-10-CM | POA: Diagnosis present

## 2014-10-13 DIAGNOSIS — R140 Abdominal distension (gaseous): Secondary | ICD-10-CM

## 2014-10-13 DIAGNOSIS — R52 Pain, unspecified: Secondary | ICD-10-CM

## 2014-10-13 DIAGNOSIS — L0291 Cutaneous abscess, unspecified: Secondary | ICD-10-CM

## 2014-10-13 DIAGNOSIS — E785 Hyperlipidemia, unspecified: Secondary | ICD-10-CM | POA: Diagnosis present

## 2014-10-13 HISTORY — DX: Diverticulitis of intestine, part unspecified, without perforation or abscess without bleeding: K57.92

## 2014-10-13 LAB — CBC WITH DIFFERENTIAL/PLATELET
Basophils Absolute: 0 10*3/uL (ref 0.0–0.1)
Basophils Relative: 0 % (ref 0–1)
EOS ABS: 0.1 10*3/uL (ref 0.0–0.7)
Eosinophils Relative: 1 % (ref 0–5)
HEMATOCRIT: 36.3 % — AB (ref 39.0–52.0)
Hemoglobin: 12.1 g/dL — ABNORMAL LOW (ref 13.0–17.0)
Lymphocytes Relative: 20 % (ref 12–46)
Lymphs Abs: 2.3 10*3/uL (ref 0.7–4.0)
MCH: 28.8 pg (ref 26.0–34.0)
MCHC: 33.3 g/dL (ref 30.0–36.0)
MCV: 86.4 fL (ref 78.0–100.0)
Monocytes Absolute: 1.8 10*3/uL — ABNORMAL HIGH (ref 0.1–1.0)
Monocytes Relative: 16 % — ABNORMAL HIGH (ref 3–12)
NEUTROS ABS: 7.1 10*3/uL (ref 1.7–7.7)
NEUTROS PCT: 63 % (ref 43–77)
Platelets: 231 10*3/uL (ref 150–400)
RBC: 4.2 MIL/uL — ABNORMAL LOW (ref 4.22–5.81)
RDW: 14.4 % (ref 11.5–15.5)
WBC: 11.3 10*3/uL — ABNORMAL HIGH (ref 4.0–10.5)

## 2014-10-13 LAB — COMPREHENSIVE METABOLIC PANEL
ALT: 19 U/L (ref 0–53)
AST: 11 U/L (ref 0–37)
Albumin: 3 g/dL — ABNORMAL LOW (ref 3.5–5.2)
Alkaline Phosphatase: 52 U/L (ref 39–117)
Anion gap: 9 (ref 5–15)
BILIRUBIN TOTAL: 0.8 mg/dL (ref 0.3–1.2)
BUN: 18 mg/dL (ref 6–23)
CO2: 27 mmol/L (ref 19–32)
CREATININE: 1.34 mg/dL (ref 0.50–1.35)
Calcium: 8.8 mg/dL (ref 8.4–10.5)
Chloride: 102 mmol/L (ref 96–112)
GFR calc Af Amer: 64 mL/min — ABNORMAL LOW (ref >90)
GFR, EST NON AFRICAN AMERICAN: 56 mL/min — AB (ref >90)
GLUCOSE: 106 mg/dL — AB (ref 70–99)
Potassium: 4.2 mmol/L (ref 3.5–5.1)
Sodium: 138 mmol/L (ref 135–145)
Total Protein: 7.7 g/dL (ref 6.0–8.3)

## 2014-10-13 LAB — URINALYSIS, ROUTINE W REFLEX MICROSCOPIC
Glucose, UA: NEGATIVE mg/dL
HGB URINE DIPSTICK: NEGATIVE
Ketones, ur: 15 mg/dL — AB
Nitrite: NEGATIVE
Protein, ur: 30 mg/dL — AB
Specific Gravity, Urine: 1.03 (ref 1.005–1.030)
UROBILINOGEN UA: 1 mg/dL (ref 0.0–1.0)
pH: 5.5 (ref 5.0–8.0)

## 2014-10-13 LAB — URINE MICROSCOPIC-ADD ON

## 2014-10-13 LAB — GLUCOSE, CAPILLARY: Glucose-Capillary: 87 mg/dL (ref 70–99)

## 2014-10-13 LAB — LIPASE, BLOOD: LIPASE: 19 U/L (ref 11–59)

## 2014-10-13 MED ORDER — IOHEXOL 300 MG/ML  SOLN
25.0000 mL | Freq: Once | INTRAMUSCULAR | Status: AC | PRN
  Administered 2014-10-13: 25 mL via ORAL

## 2014-10-13 MED ORDER — PRAVASTATIN SODIUM 80 MG PO TABS
80.0000 mg | ORAL_TABLET | Freq: Every day | ORAL | Status: DC
  Administered 2014-10-13 – 2014-10-16 (×4): 80 mg via ORAL
  Filled 2014-10-13 (×5): qty 1

## 2014-10-13 MED ORDER — OXYCODONE HCL 5 MG PO TABS
5.0000 mg | ORAL_TABLET | ORAL | Status: DC | PRN
  Administered 2014-10-13 – 2014-10-15 (×5): 5 mg via ORAL
  Filled 2014-10-13 (×5): qty 1

## 2014-10-13 MED ORDER — METRONIDAZOLE IN NACL 5-0.79 MG/ML-% IV SOLN
500.0000 mg | Freq: Three times a day (TID) | INTRAVENOUS | Status: DC
  Administered 2014-10-13 – 2014-10-17 (×11): 500 mg via INTRAVENOUS
  Filled 2014-10-13 (×13): qty 100

## 2014-10-13 MED ORDER — CIPROFLOXACIN IN D5W 400 MG/200ML IV SOLN
400.0000 mg | Freq: Once | INTRAVENOUS | Status: DC
  Administered 2014-10-13: 400 mg via INTRAVENOUS
  Filled 2014-10-13: qty 200

## 2014-10-13 MED ORDER — ONDANSETRON HCL 4 MG/2ML IJ SOLN
4.0000 mg | Freq: Four times a day (QID) | INTRAMUSCULAR | Status: DC | PRN
  Administered 2014-10-14: 4 mg via INTRAVENOUS
  Filled 2014-10-13 (×2): qty 2

## 2014-10-13 MED ORDER — ONDANSETRON HCL 4 MG PO TABS: 4.0000 mg | ORAL_TABLET | Freq: Four times a day (QID) | ORAL | Status: DC | PRN

## 2014-10-13 MED ORDER — IOHEXOL 300 MG/ML  SOLN
100.0000 mL | Freq: Once | INTRAMUSCULAR | Status: AC | PRN
  Administered 2014-10-13: 100 mL via INTRAVENOUS

## 2014-10-13 MED ORDER — DIVALPROEX SODIUM ER 500 MG PO TB24
1000.0000 mg | ORAL_TABLET | Freq: Every day | ORAL | Status: DC
  Administered 2014-10-14 – 2014-10-16 (×3): 1000 mg via ORAL
  Filled 2014-10-13 (×6): qty 2

## 2014-10-13 MED ORDER — CIPROFLOXACIN IN D5W 400 MG/200ML IV SOLN
400.0000 mg | Freq: Two times a day (BID) | INTRAVENOUS | Status: DC
Start: 2014-10-14 — End: 2014-10-17
  Administered 2014-10-14 – 2014-10-16 (×6): 400 mg via INTRAVENOUS
  Filled 2014-10-13 (×10): qty 200

## 2014-10-13 MED ORDER — MORPHINE SULFATE 2 MG/ML IJ SOLN
1.0000 mg | INTRAMUSCULAR | Status: DC | PRN
  Administered 2014-10-14: 1 mg via INTRAVENOUS
  Filled 2014-10-13: qty 1

## 2014-10-13 MED ORDER — LISINOPRIL 40 MG PO TABS
40.0000 mg | ORAL_TABLET | Freq: Every day | ORAL | Status: DC
  Administered 2014-10-14 – 2014-10-17 (×4): 40 mg via ORAL
  Filled 2014-10-13 (×4): qty 1

## 2014-10-13 MED ORDER — KCL IN DEXTROSE-NACL 20-5-0.45 MEQ/L-%-% IV SOLN
INTRAVENOUS | Status: DC
  Administered 2014-10-13 – 2014-10-16 (×4): via INTRAVENOUS
  Filled 2014-10-13 (×8): qty 1000

## 2014-10-13 MED ORDER — RISPERIDONE 0.5 MG PO TABS
4.0000 mg | ORAL_TABLET | Freq: Two times a day (BID) | ORAL | Status: DC
  Administered 2014-10-14 – 2014-10-17 (×7): 4 mg via ORAL
  Filled 2014-10-13 (×5): qty 8

## 2014-10-13 MED ORDER — METRONIDAZOLE IN NACL 5-0.79 MG/ML-% IV SOLN
500.0000 mg | Freq: Once | INTRAVENOUS | Status: AC
  Administered 2014-10-13: 500 mg via INTRAVENOUS
  Filled 2014-10-13: qty 100

## 2014-10-13 MED ORDER — SODIUM CHLORIDE 0.9 % IV SOLN
Freq: Once | INTRAVENOUS | Status: AC
  Administered 2014-10-13: 17:00:00 via INTRAVENOUS

## 2014-10-13 MED ORDER — AMLODIPINE BESYLATE 10 MG PO TABS
10.0000 mg | ORAL_TABLET | Freq: Every day | ORAL | Status: DC
  Administered 2014-10-14 – 2014-10-17 (×4): 10 mg via ORAL
  Filled 2014-10-13: qty 2
  Filled 2014-10-13 (×3): qty 1

## 2014-10-13 MED ORDER — HEPARIN SODIUM (PORCINE) 5000 UNIT/ML IJ SOLN
5000.0000 [IU] | Freq: Three times a day (TID) | INTRAMUSCULAR | Status: DC
  Administered 2014-10-13 – 2014-10-14 (×2): 5000 [IU] via SUBCUTANEOUS
  Filled 2014-10-13 (×2): qty 1

## 2014-10-13 NOTE — Consult Note (Signed)
Reason for Consult:diverticular abscess Referring Physician: [name] is an 64 y.o. male.  HPI: asked to see at the request of Dr Wendee Beavers for diverticular abscess on CT. Pthas had LLQ abdominal pain for 4 days.  Crampy and sharp.  Nothing made it better. Worse with palpation.  No diarrhea or vomiting.   Past Medical History  Diagnosis Date  . Hypertension   . Hyperlipemia   . Bipolar 2 disorder   . Hypogonadism male   . Thrombocytopenia, unspecified 06/17/2013  . Diabetes mellitus without complication     Past Surgical History  Procedure Laterality Date  . Colonoscopy      Family History  Problem Relation Age of Onset  . Cancer Brother     abdominal CA  . Colon cancer Neg Hx     Social History:  reports that he has been smoking Cigarettes.  He has been smoking about 1.00 pack per day for the past 0 years. He has never used smokeless tobacco. He reports that he does not drink alcohol or use illicit drugs.  Allergies: No Known Allergies  Medications: I have reviewed the patient's current medications.  Results for orders placed or performed during the hospital encounter of 10/13/14 (from the past 48 hour(s))  CBC with Differential     Status: Abnormal   Collection Time: 10/13/14  1:54 PM  Result Value Ref Range   WBC 11.3 (H) 4.0 - 10.5 K/uL   RBC 4.20 (L) 4.22 - 5.81 MIL/uL   Hemoglobin 12.1 (L) 13.0 - 17.0 g/dL   HCT 36.3 (L) 39.0 - 52.0 %   MCV 86.4 78.0 - 100.0 fL   MCH 28.8 26.0 - 34.0 pg   MCHC 33.3 30.0 - 36.0 g/dL   RDW 14.4 11.5 - 15.5 %   Platelets 231 150 - 400 K/uL   Neutrophils Relative % 63 43 - 77 %   Lymphocytes Relative 20 12 - 46 %   Monocytes Relative 16 (H) 3 - 12 %   Eosinophils Relative 1 0 - 5 %   Basophils Relative 0 0 - 1 %   Neutro Abs 7.1 1.7 - 7.7 K/uL   Lymphs Abs 2.3 0.7 - 4.0 K/uL   Monocytes Absolute 1.8 (H) 0.1 - 1.0 K/uL   Eosinophils Absolute 0.1 0.0 - 0.7 K/uL   Basophils Absolute 0.0 0.0 - 0.1 K/uL   WBC Morphology  ATYPICAL LYMPHOCYTES   Comprehensive metabolic panel     Status: Abnormal   Collection Time: 10/13/14  1:54 PM  Result Value Ref Range   Sodium 138 135 - 145 mmol/L   Potassium 4.2 3.5 - 5.1 mmol/L   Chloride 102 96 - 112 mmol/L   CO2 27 19 - 32 mmol/L   Glucose, Bld 106 (H) 70 - 99 mg/dL   BUN 18 6 - 23 mg/dL   Creatinine, Ser 1.34 0.50 - 1.35 mg/dL   Calcium 8.8 8.4 - 10.5 mg/dL   Total Protein 7.7 6.0 - 8.3 g/dL   Albumin 3.0 (L) 3.5 - 5.2 g/dL   AST 11 0 - 37 U/L   ALT 19 0 - 53 U/L   Alkaline Phosphatase 52 39 - 117 U/L   Total Bilirubin 0.8 0.3 - 1.2 mg/dL   GFR calc non Af Amer 56 (L) >90 mL/min   GFR calc Af Amer 64 (L) >90 mL/min    Comment: (NOTE) The eGFR has been calculated using the CKD EPI equation. This calculation has not been validated  in all clinical situations. eGFR's persistently <90 mL/min signify possible Chronic Kidney Disease.    Anion gap 9 5 - 15  Lipase, blood     Status: None   Collection Time: 10/13/14  1:54 PM  Result Value Ref Range   Lipase 19 11 - 59 U/L  Urinalysis, Routine w reflex microscopic     Status: Abnormal   Collection Time: 10/13/14  4:43 PM  Result Value Ref Range   Color, Urine ORANGE (A) YELLOW    Comment: BIOCHEMICALS MAY BE AFFECTED BY COLOR   APPearance CLOUDY (A) CLEAR   Specific Gravity, Urine 1.030 1.005 - 1.030   pH 5.5 5.0 - 8.0   Glucose, UA NEGATIVE NEGATIVE mg/dL   Hgb urine dipstick NEGATIVE NEGATIVE   Bilirubin Urine SMALL (A) NEGATIVE   Ketones, ur 15 (A) NEGATIVE mg/dL   Protein, ur 30 (A) NEGATIVE mg/dL   Urobilinogen, UA 1.0 0.0 - 1.0 mg/dL   Nitrite NEGATIVE NEGATIVE   Leukocytes, UA MODERATE (A) NEGATIVE  Urine microscopic-add on     Status: Abnormal   Collection Time: 10/13/14  4:43 PM  Result Value Ref Range   Squamous Epithelial / LPF MANY (A) RARE   WBC, UA 11-20 <3 WBC/hpf   RBC / HPF 0-2 <3 RBC/hpf   Bacteria, UA FEW (A) RARE   Casts HYALINE CASTS (A) NEGATIVE   Urine-Other MUCOUS PRESENT      Comment: TRICHOMONAS PRESENT    Ct Abdomen Pelvis W Contrast  10/13/2014   CLINICAL DATA:  Pain. Fever, abdominal pain, and constipation for 4 days. Lower abdominal pain and distention.  EXAM: CT ABDOMEN AND PELVIS WITH CONTRAST  TECHNIQUE: Multidetector CT imaging of the abdomen and pelvis was performed using the standard protocol following bolus administration of intravenous contrast.  CONTRAST:  199m OMNIPAQUE IOHEXOL 300 MG/ML  SOLN  COMPARISON:  11/13/2011  FINDINGS: Mild dependent changes in the lung bases.  Small focal area of fatty infiltration adjacent to the falciform ligament in the liver. No other focal lesions identified in the liver. Gallbladder, spleen, pancreas, adrenal glands, abdominal aorta, inferior vena cava, and retroperitoneal lymph nodes are unremarkable. Small cysts on both kidneys. No solid mass or hydronephrosis. Stomach and small bowel are not abnormally distended. There is focal wall thickening and prominent pericolonic inflammatory infiltration around the proximal sigmoid colon which extends up into the left upper quadrant. Inflammatory changes extend to the splenic flexure of the colon. There is focal fluid collection measuring 3.8 x 4.4 cm diameter. Appearance is consistent with acute diverticulitis with developing pericolonic abscess. Suggest follow-up after resolution of acute process to exclude underlying colon neoplasm. Scattered diverticula throughout the colon. No colonic distention.  Pelvis: Appendix is normal. Prostate gland is enlarged. Bladder wall is not thickened. No free or loculated pelvic fluid collections. No pelvic mass or lymphadenopathy.  IMPRESSION: Changes of acute diverticulitis in the sigmoid colon, which extends up into the left upper quadrant. There is associated pericolonic inflammation and abscess measuring up to 4.4 cm diameter.   Electronically Signed   By: WLucienne CapersM.D.   On: 10/13/2014 19:26   Dg Abd Acute W/chest  10/13/2014    CLINICAL DATA:  64year old male with a history of abdominal pain, constipation for 4 days. Fever  EXAM: ACUTE ABDOMEN SERIES (ABDOMEN 2 VIEW & CHEST 1 VIEW)  COMPARISON:  Plain film 11/12/2013, CT 11/13/2011  FINDINGS: Chest:  Cardiomediastinal silhouette within normal limits in size and contour  New airspace opacity at the  medial right base, not present on the comparison plain film. This appears to be within the right lower lobe. Blunting of the left costophrenic angle.  No pulmonary vascular congestion or pneumothorax. Thickening of the rib minor fissure.  Abdomen:  Borderline dilated small bowel loops within the left abdomen.  Formed stool within the right and left colon. No gas within the rectum. Minimal gas within the stomach.  No unexpected calcifications. Unremarkable appearance of the liver silhouette, spleen silhouette, and left and right renal silhouette.  No displaced fracture.  Pseudoarticulation of the right L5 transverse process with the sacrum.  IMPRESSION: Chest:  New airspace opacity at the medial right base, concerning for infection given the patient's history.  Abdomen:  Nonspecific bowel gas pattern with borderline dilated small bowel loops in the left abdomen. If there is concern for acute abdominal process, CT would be recommended.  Signed,  Dulcy Fanny. Earleen Newport, DO  [facility]  [facility]   Electronically Signed   By: Corrie Mckusick D.O.   On: 10/13/2014 15:29    Review of Systems  Constitutional: Negative for fever and chills.  HENT: Negative.   Eyes: Negative.   Respiratory: Negative.   Cardiovascular: Negative.   Gastrointestinal: Positive for abdominal pain. Negative for diarrhea.  Genitourinary: Negative.   Skin: Negative.   Neurological: Negative.   Psychiatric/Behavioral: Negative.    Blood pressure 107/36, pulse 84, temperature 98.2 F (36.8 C), temperature source Oral, resp. rate 20, SpO2 95 %. Physical Exam   Constitutional: He is oriented to person, place, and time. He appears well-developed and well-nourished.  HENT:  Head: Normocephalic.  Eyes: Pupils are equal, round, and reactive to light. No scleral icterus.  Cardiovascular: Normal rate.   Respiratory: Effort normal.  GI: There is tenderness in the left lower quadrant.  Neurological: He is alert and oriented to person, place, and time.  Skin: Skin is warm and dry.  Psychiatric: He has a normal mood and affect. His behavior is normal. Judgment and thought content normal.    Assessment/Plan: Diverticular abscess  IV ABX IR consult Will follow.   Heloise Gordan A. 10/13/2014, 9:08 PM

## 2014-10-13 NOTE — ED Notes (Signed)
Attempted report 

## 2014-10-13 NOTE — ED Notes (Signed)
CT called to inform pt finished contrast.

## 2014-10-13 NOTE — Consult Note (Signed)
PHARMACY NOTE  CONSULT :  Ciprofloxacin INDICATION :  Diverticular Abscess  ASSESSMENT:  Pharmacy consulted for dosing Cipro IV for diverticular abscess on CT scan..     Patient has received singe dose of Cipro 400 mg IV since admission.  Dosing Weight  100.7 kg,  SCr 1.34,  estimated CrCl  67 ml/min  Patient is also to receive Flagyl 500 mg IV q 8 hours.  PLAN:  1. Continue Cipro 400 mg IV q 12 hours.  2. Begin next dose 12 hours after the first dose given.  3. Continue Flagyl 500 mg IV q 8 hours [no dose adjustments required]. 4. Recommend Monitoring renal function, WBC's, fever curve, any cultures/sensitivities, and clinical progression. 5. Pharmacy will Sign Off and follow peripherally given no adjustments in doses or schedules are anticipated.  There are alerts to indicate dramatic changes in renal function or clinical condition that might require dose or schedule adjustments.  Please re-consult if additional assistance is needed. Thank you for allowing Pharmacy to participate in this patient's care   Estelle June,  Pharm.D. ,  10/13/2014,  9:38 PM

## 2014-10-13 NOTE — ED Notes (Signed)
Hospitalist at bedside 

## 2014-10-13 NOTE — ED Notes (Signed)
Pt doesn't wish to have pain medicine at this time.

## 2014-10-13 NOTE — ED Provider Notes (Signed)
CSN: [id number]     Arrival date & time 10/13/14  1331 History   First MD Initiated Contact with Patient 10/13/14 1624     Chief Complaint  Patient presents with  . Fever  . Abdominal Pain  . Weakness     (Consider location/radiation/quality/duration/timing/severity/associated sxs/prior Treatment) Patient is a 64 y.o. Bradley [name] presenting with abdominal pain. The history is provided by the patient (pt complains of abd pain for a couple days).  Abdominal Pain Pain location:  LLQ Pain quality: aching   Pain radiates to:  Does not radiate Pain severity:  Moderate Onset quality:  Gradual Timing:  Constant Progression:  Waxing and waning Chronicity:  New Relieved by:  Nothing Associated symptoms: no chest pain, no cough, no diarrhea, no fatigue and no hematuria     Past Medical History  Diagnosis Date  . Hypertension   . Hyperlipemia   . Bipolar 2 disorder   . Hypogonadism Bradley [name]   . Thrombocytopenia, unspecified 06/17/2013  . Diabetes mellitus without complication    Past Surgical History  Procedure Laterality Date  . Colonoscopy     Family History  Problem Relation Age of Onset  . Cancer Brother     abdominal CA  . Colon cancer Neg Hx    History  Substance Use Topics  . Smoking status: Current Every Day Smoker -- 1.00 packs/day for 0 years    Types: Cigarettes  . Smokeless tobacco: Never Used  . Alcohol Use: No    Review of Systems  Constitutional: Negative for appetite change and fatigue.  HENT: Negative for congestion, ear discharge and sinus pressure.   Eyes: Negative for discharge.  Respiratory: Negative for cough.   Cardiovascular: Negative for chest pain.  Gastrointestinal: Positive for abdominal pain. Negative for diarrhea.  Genitourinary: Negative for frequency and hematuria.  Musculoskeletal: Negative for back pain.  Skin: Negative for rash.  Neurological: Negative for seizures and headaches.  Psychiatric/Behavioral: Negative for hallucinations.       Allergies  Review of patient's allergies indicates no known allergies.  Home Medications   Prior to Admission medications   Medication Sig Start Date End Date Taking? Authorizing Provider  amLODipine (NORVASC) 10 MG tablet Take 10 mg by mouth daily.   Yes Historical Provider, MD  divalproex (DEPAKOTE ER) 500 MG 24 hr tablet Take 1,000 mg by mouth at bedtime.   Yes Historical Provider, MD  HYDROcodone-acetaminophen (NORCO/VICODIN) 5-325 MG per tablet Take 1-2 tablets every 6 hours as needed for severe pain Patient not taking: Reported on 10/13/2014 03/27/14   Carlisle Cater, PA-C  lisinopril (PRINIVIL,ZESTRIL) Bradley MG tablet Take Bradley mg by mouth daily.   Yes Historical Provider, MD  metFORMIN (GLUCOPHAGE) 500 MG tablet Take 500 mg by mouth daily. 05/26/13  Yes Historical Provider, MD  naproxen (NAPROSYN) 500 MG tablet Take 1 tablet (500 mg total) by mouth 2 (two) times daily. Patient not taking: Reported on 10/13/2014 03/27/14   Carlisle Cater, PA-C  penicillin v potassium (VEETID) 500 MG tablet Take 1 tablet (500 mg total) by mouth 3 (three) times daily. Patient not taking: Reported on 10/13/2014 03/27/14   Carlisle Cater, PA-C  pravastatin (PRAVACHOL) 80 MG tablet Take 80 mg by mouth daily.   Yes Historical Provider, MD  risperidone (RISPERDAL) 4 MG tablet Take 4 mg by mouth 2 (two) times daily.   Yes Historical Provider, MD   BP 125/63 mmHg  Pulse 77  Temp(Src) 98.2 F (36.8 C) (Oral)  Resp 20  SpO2 97% Physical Exam  Constitutional: He is oriented to person, place, and time. He appears well-developed.  HENT:  Head: Normocephalic.  Eyes: Conjunctivae and EOM are normal. No scleral icterus.  Neck: Neck supple. No thyromegaly present.  Cardiovascular: Normal rate and regular rhythm.  Exam reveals no gallop and no friction rub.   No murmur heard. Pulmonary/Chest: No stridor. He has no wheezes. He has no rales. He exhibits no tenderness.  Abdominal: He exhibits no distension. There is  tenderness. There is no rebound.  Tender left lower abd pain  Musculoskeletal: Normal range of motion. He exhibits no edema.  Lymphadenopathy:    He has no cervical adenopathy.  Neurological: He is oriented to person, place, and time. He exhibits normal muscle tone. Coordination normal.  Skin: No rash noted. No erythema.  Psychiatric: He has a normal mood and affect. His behavior is normal.    ED Course  Procedures (including critical care time) Labs Review Labs Reviewed  CBC WITH DIFFERENTIAL/PLATELET - Abnormal; Notable for the following:    WBC 11.3 (*)    RBC 4.20 (*)    Hemoglobin 12.1 (*)    HCT 36.3 (*)    Monocytes Relative 16 (*)    Monocytes Absolute 1.8 (*)    All other components within normal limits  COMPREHENSIVE METABOLIC PANEL - Abnormal; Notable for the following:    Glucose, Bld 106 (*)    Albumin 3.0 (*)    GFR calc non Af Amer 56 (*)    GFR calc Af Amer 64 (*)    All other components within normal limits  URINALYSIS, ROUTINE W REFLEX MICROSCOPIC - Abnormal; Notable for the following:    Color, Urine ORANGE (*)    APPearance CLOUDY (*)    Bilirubin Urine SMALL (*)    Ketones, ur 15 (*)    Protein, ur 30 (*)    Leukocytes, UA MODERATE (*)    All other components within normal limits  URINE MICROSCOPIC-ADD ON - Abnormal; Notable for the following:    Squamous Epithelial / LPF MANY (*)    Bacteria, UA FEW (*)    Casts HYALINE CASTS (*)    All other components within normal limits  LIPASE, BLOOD    Imaging Review Ct Abdomen Pelvis W Contrast  10/13/2014   CLINICAL DATA:  Pain. Fever, abdominal pain, and constipation for 4 days. Lower abdominal pain and distention.  EXAM: CT ABDOMEN AND PELVIS WITH CONTRAST  TECHNIQUE: Multidetector CT imaging of the abdomen and pelvis was performed using the standard protocol following bolus administration of intravenous contrast.  CONTRAST:  139mL OMNIPAQUE IOHEXOL 300 MG/ML  SOLN  COMPARISON:  11/13/2011  FINDINGS: Mild  dependent changes in the lung bases.  Small focal area of fatty infiltration adjacent to the falciform ligament in the liver. No other focal lesions identified in the liver. Gallbladder, spleen, pancreas, adrenal glands, abdominal aorta, inferior vena cava, and retroperitoneal lymph nodes are unremarkable. Small cysts on both kidneys. No solid mass or hydronephrosis. Stomach and small bowel are not abnormally distended. There is focal wall thickening and prominent pericolonic inflammatory infiltration around the proximal sigmoid colon which extends up into the left upper quadrant. Inflammatory changes extend to the splenic flexure of the colon. There is focal fluid collection measuring 3.8 x 4.4 cm diameter. Appearance is consistent with acute diverticulitis with developing pericolonic abscess. Suggest follow-up after resolution of acute process to exclude underlying colon neoplasm. Scattered diverticula throughout the colon. No colonic distention.  Pelvis: Appendix is normal. Prostate gland  is enlarged. Bladder wall is not thickened. No free or loculated pelvic fluid collections. No pelvic mass or lymphadenopathy.  IMPRESSION: Changes of acute diverticulitis in the sigmoid colon, which extends up into the left upper quadrant. There is associated pericolonic inflammation and abscess measuring up to 4.4 cm diameter.   Electronically Signed   By: Lucienne Capers M.D.   On: 10/13/2014 19:26   Dg Abd Acute W/chest  10/13/2014   CLINICAL DATA:  64 year old Bradley [name] with a history of abdominal pain, constipation for 4 days. Fever  EXAM: ACUTE ABDOMEN SERIES (ABDOMEN 2 VIEW & CHEST 1 VIEW)  COMPARISON:  Plain film 11/12/2013, CT 11/13/2011  FINDINGS: Chest:  Cardiomediastinal silhouette within normal limits in size and contour  New airspace opacity at the medial right base, not present on the comparison plain film. This appears to be within the right lower lobe. Blunting of the left costophrenic angle.  No pulmonary  vascular congestion or pneumothorax. Thickening of the rib minor fissure.  Abdomen:  Borderline dilated small bowel loops within the left abdomen.  Formed stool within the right and left colon. No gas within the rectum. Minimal gas within the stomach.  No unexpected calcifications. Unremarkable appearance of the liver silhouette, spleen silhouette, and left and right renal silhouette.  No displaced fracture.  Pseudoarticulation of the right L5 transverse process with the sacrum.  IMPRESSION: Chest:  New airspace opacity at the medial right base, concerning for infection given the patient's history.  Abdomen:  Nonspecific bowel gas pattern with borderline dilated small bowel loops in the left abdomen. If there is concern for acute abdominal process, CT would be recommended.  Signed,  Dulcy Fanny. Earleen Newport, DO  [facility]  [facility]   Electronically Signed   By: Corrie Mckusick D.O.   On: 10/13/2014 15:29     EKG Interpretation None      MDM   Final diagnoses:  Pain  Diverticulitis of large intestine with abscess without bleeding        Maudry Diego, MD 10/13/14 2018

## 2014-10-13 NOTE — H&P (Signed)
[facility] History and Physical  Jarin Cornfield FTD:[id number] DOB: [birth year] DOA: 10/13/2014  Referring physician: Dr. Roderic Palau PCP: Benito Mccreedy, MD   Chief Complaint: abdominal discomfort and fever  HPI: Bradley [name] is a 64 y.o. male  With history of bipolar disorder, tobacco dependence, hypertension, hyperlipidemia. Presents to the hospital complaining of 4 days of abdominal discomfort and fever. Otherwise he denies any other symptoms. Denies any bright red blood per rectum or any skin rashes. The pain is generalized nothing he is aware of makes it worse or better. The problem since onset has been persistent and getting worse as such he presented to the ED for further evaluation recommendations.  While in the ED patient was found to have after CT scan diverticulitis with pericolonic inflammation and abscess measuring 4.4 cm.   Review of Systems:  Constitutional:  No weight loss, night sweats,+ Fevers, chills, fatigue.  HEENT:  No headaches, Difficulty swallowing,Tooth/dental problems,Sore throat,  No sneezing, itching, ear ache, nasal congestion, post nasal drip,  Cardio-vascular:  No chest pain, Orthopnea, PND, swelling in lower extremities, anasarca, dizziness, palpitations  GI:  No heartburn, indigestion,+ abdominal pain, nausea, vomiting, diarrhea, change in bowel habits, loss of appetite  Resp:  No shortness of breath with exertion or at rest. No excess mucus, no productive cough, No non-productive cough, No coughing up of blood.No change in color of mucus.No wheezing.No chest wall deformity  Skin:  no rash or lesions.  GU:  no dysuria, change in color of urine, no urgency or frequency. No flank pain.  Musculoskeletal:  No joint pain or swelling. No decreased range of motion. No back pain.  Psych:  No change in mood or affect. No depression or anxiety. No memory loss.   Past Medical History  Diagnosis Date  . Hypertension   . Hyperlipemia   .  Bipolar 2 disorder   . Hypogonadism male   . Thrombocytopenia, unspecified 06/17/2013  . Diabetes mellitus without complication    Past Surgical History  Procedure Laterality Date  . Colonoscopy     Social History:  reports that he has been smoking Cigarettes.  He has been smoking about 1.00 pack per day for the past 0 years. He has never used smokeless tobacco. He reports that he does not drink alcohol or use illicit drugs.  No Known Allergies  Family History  Problem Relation Age of Onset  . Cancer Brother     abdominal CA  . Colon cancer Neg Hx     Prior to Admission medications   Medication Sig Start Date End Date Taking? Authorizing Provider  amLODipine (NORVASC) 10 MG tablet Take 10 mg by mouth daily.   Yes Historical Provider, MD  divalproex (DEPAKOTE ER) 500 MG 24 hr tablet Take 1,000 mg by mouth at bedtime.   Yes Historical Provider, MD  HYDROcodone-acetaminophen (NORCO/VICODIN) 5-325 MG per tablet Take 1-2 tablets every 6 hours as needed for severe pain Patient not taking: Reported on 10/13/2014 03/27/14   Carlisle Cater, PA-C  lisinopril (PRINIVIL,ZESTRIL) 40 MG tablet Take 40 mg by mouth daily.   Yes Historical Provider, MD  metFORMIN (GLUCOPHAGE) 500 MG tablet Take 500 mg by mouth daily. 05/26/13  Yes Historical Provider, MD  naproxen (NAPROSYN) 500 MG tablet Take 1 tablet (500 mg total) by mouth 2 (two) times daily. Patient not taking: Reported on 10/13/2014 03/27/14   Carlisle Cater, PA-C  penicillin v potassium (VEETID) 500 MG tablet Take 1 tablet (500 mg total) by mouth 3 (three) times daily. Patient not  taking: Reported on 10/13/2014 03/27/14   Carlisle Cater, PA-C  pravastatin (PRAVACHOL) 80 MG tablet Take 80 mg by mouth daily.   Yes Historical Provider, MD  risperidone (RISPERDAL) 4 MG tablet Take 4 mg by mouth 2 (two) times daily.   Yes Historical Provider, MD   Physical Exam: Filed Vitals:   10/13/14 1915 10/13/14 1945 10/13/14 1956 10/13/14 2015  BP: 132/67 125/63  125/63 107/36  Pulse: 81 77 77 84  Temp:      TempSrc:      Resp: 19  20   SpO2: 96% 94% 97% 95%    Wt Readings from Last 3 Encounters:  03/27/14 99.791 kg (220 lb)  01/22/14 103.828 kg (228 lb 14.4 oz)  12/30/13 102.967 kg (227 lb)    General:  Appears calm and comfortable Eyes: PERRL, normal lids, irises & conjunctiva ENT: grossly normal hearing, lips & tongue Neck: no LAD, masses or thyromegaly Cardiovascular: RRR, no m/r/g. No LE edema. Respiratory: CTA bilaterally, no w/r/r. Normal respiratory effort. Abdomen: soft, hypoactive bowel sounds, no guarding, no rebound tenderness, generalized discomfort with deep palpation Skin: no rash or induration seen on limited exam Musculoskeletal: grossly normal tone BUE/BLE Psychiatric: grossly normal mood and affect, speech fluent and appropriate Neurologic: grossly non-focal.          Labs on Admission:  Basic Metabolic Panel:  Recent Labs Lab 10/13/14 1354  NA 138  K 4.2  CL 102  CO2 27  GLUCOSE 106*  BUN 18  CREATININE 1.34  CALCIUM 8.8   Liver Function Tests:  Recent Labs Lab 10/13/14 1354  AST 11  ALT 19  ALKPHOS 52  BILITOT 0.8  PROT 7.7  ALBUMIN 3.0*    Recent Labs Lab 10/13/14 1354  LIPASE 19   No results for input(s): AMMONIA in the last 168 hours. CBC:  Recent Labs Lab 10/13/14 1354  WBC 11.3*  NEUTROABS 7.1  HGB 12.1*  HCT 36.3*  MCV 86.4  PLT 231   Cardiac Enzymes: No results for input(s): CKTOTAL, CKMB, CKMBINDEX, TROPONINI in the last 168 hours.  BNP (last 3 results) No results for input(s): BNP in the last 8760 hours.  ProBNP (last 3 results) No results for input(s): PROBNP in the last 8760 hours.  CBG: No results for input(s): GLUCAP in the last 168 hours.  Radiological Exams on Admission: Ct Abdomen Pelvis W Contrast  10/13/2014   CLINICAL DATA:  Pain. Fever, abdominal pain, and constipation for 4 days. Lower abdominal pain and distention.  EXAM: CT ABDOMEN AND PELVIS  WITH CONTRAST  TECHNIQUE: Multidetector CT imaging of the abdomen and pelvis was performed using the standard protocol following bolus administration of intravenous contrast.  CONTRAST:  172mL OMNIPAQUE IOHEXOL 300 MG/ML  SOLN  COMPARISON:  11/13/2011  FINDINGS: Mild dependent changes in the lung bases.  Small focal area of fatty infiltration adjacent to the falciform ligament in the liver. No other focal lesions identified in the liver. Gallbladder, spleen, pancreas, adrenal glands, abdominal aorta, inferior vena cava, and retroperitoneal lymph nodes are unremarkable. Small cysts on both kidneys. No solid mass or hydronephrosis. Stomach and small bowel are not abnormally distended. There is focal wall thickening and prominent pericolonic inflammatory infiltration around the proximal sigmoid colon which extends up into the left upper quadrant. Inflammatory changes extend to the splenic flexure of the colon. There is focal fluid collection measuring 3.8 x 4.4 cm diameter. Appearance is consistent with acute diverticulitis with developing pericolonic abscess. Suggest follow-up after resolution of  acute process to exclude underlying colon neoplasm. Scattered diverticula throughout the colon. No colonic distention.  Pelvis: Appendix is normal. Prostate gland is enlarged. Bladder wall is not thickened. No free or loculated pelvic fluid collections. No pelvic mass or lymphadenopathy.  IMPRESSION: Changes of acute diverticulitis in the sigmoid colon, which extends up into the left upper quadrant. There is associated pericolonic inflammation and abscess measuring up to 4.4 cm diameter.   Electronically Signed   By: Lucienne Capers M.D.   On: 10/13/2014 19:26   Dg Abd Acute W/chest  10/13/2014   CLINICAL DATA:  64 year old male with a history of abdominal pain, constipation for 4 days. Fever  EXAM: ACUTE ABDOMEN SERIES (ABDOMEN 2 VIEW & CHEST 1 VIEW)  COMPARISON:  Plain film 11/12/2013, CT 11/13/2011  FINDINGS: Chest:   Cardiomediastinal silhouette within normal limits in size and contour  New airspace opacity at the medial right base, not present on the comparison plain film. This appears to be within the right lower lobe. Blunting of the left costophrenic angle.  No pulmonary vascular congestion or pneumothorax. Thickening of the rib minor fissure.  Abdomen:  Borderline dilated small bowel loops within the left abdomen.  Formed stool within the right and left colon. No gas within the rectum. Minimal gas within the stomach.  No unexpected calcifications. Unremarkable appearance of the liver silhouette, spleen silhouette, and left and right renal silhouette.  No displaced fracture.  Pseudoarticulation of the right L5 transverse process with the sacrum.  IMPRESSION: Chest:  New airspace opacity at the medial right base, concerning for infection given the patient's history.  Abdomen:  Nonspecific bowel gas pattern with borderline dilated small bowel loops in the left abdomen. If there is concern for acute abdominal process, CT would be recommended.  Signed,  Dulcy Fanny. Earleen Newport, DO  [facility]  [facility]   Electronically Signed   By: Corrie Mckusick D.O.   On: 10/13/2014 15:29    Assessment/Plan   Diverticulitis -We'll place on Cipro and Flagyl -Supportive therapy    Abscess - Consulted general surgery - Continue IV antibiotics as mentioned above - Make patient nothing by mouth and place on maintenance IV fluids  Active Problems:   Hyperlipemia - Continue statin    Bipolar 2 disorder -Continue home medication regimen    Tobacco abuse - Offered nicotine replacement but patient refused  Code Status: full code DVT Prophylaxis:Heparin Family Communication: none at bedside  Disposition Plan: Pending improvement in condition and recommendations from specialist  Time spent: More than 50 minutes  Velvet Bathe [facility] Pager [phone]

## 2014-10-13 NOTE — ED Notes (Signed)
Patient with fever, abd pain, and constipation for 4 days.  He is pale in color and diaphoretic.  Patient points to lower abdomen as source of pain.  He is distended.  Patient has tried to have bm but cannot.

## 2014-10-14 ENCOUNTER — Inpatient Hospital Stay (HOSPITAL_COMMUNITY): Payer: Medicare Other

## 2014-10-14 DIAGNOSIS — K5720 Diverticulitis of large intestine with perforation and abscess without bleeding: Secondary | ICD-10-CM | POA: Insufficient documentation

## 2014-10-14 DIAGNOSIS — E785 Hyperlipidemia, unspecified: Secondary | ICD-10-CM

## 2014-10-14 LAB — CBC
HEMATOCRIT: 34 % — AB (ref 39.0–52.0)
HEMOGLOBIN: 11.1 g/dL — AB (ref 13.0–17.0)
MCH: 28.3 pg (ref 26.0–34.0)
MCHC: 32.6 g/dL (ref 30.0–36.0)
MCV: 86.7 fL (ref 78.0–100.0)
Platelets: 224 10*3/uL (ref 150–400)
RBC: 3.92 MIL/uL — AB (ref 4.22–5.81)
RDW: 14.6 % (ref 11.5–15.5)
WBC: 10.5 10*3/uL (ref 4.0–10.5)

## 2014-10-14 LAB — APTT: aPTT: 40 seconds — ABNORMAL HIGH (ref 24–37)

## 2014-10-14 LAB — BASIC METABOLIC PANEL
Anion gap: 9 (ref 5–15)
BUN: 12 mg/dL (ref 6–23)
CALCIUM: 8.2 mg/dL — AB (ref 8.4–10.5)
CHLORIDE: 101 mmol/L (ref 96–112)
CO2: 25 mmol/L (ref 19–32)
CREATININE: 0.96 mg/dL (ref 0.50–1.35)
GFR calc non Af Amer: 88 mL/min — ABNORMAL LOW (ref >90)
GLUCOSE: 107 mg/dL — AB (ref 70–99)
POTASSIUM: 4.2 mmol/L (ref 3.5–5.1)
SODIUM: 135 mmol/L (ref 135–145)

## 2014-10-14 LAB — GLUCOSE, CAPILLARY
Glucose-Capillary: 119 mg/dL — ABNORMAL HIGH (ref 70–99)
Glucose-Capillary: 110 mg/dL — ABNORMAL HIGH (ref 70–99)
Glucose-Capillary: 80 mg/dL (ref 70–99)
Glucose-Capillary: 88 mg/dL (ref 70–99)

## 2014-10-14 LAB — PROTIME-INR
INR: 1.29 (ref 0.00–1.49)
Prothrombin Time: 16.2 seconds — ABNORMAL HIGH (ref 11.6–15.2)

## 2014-10-14 MED ORDER — MIDAZOLAM HCL 2 MG/2ML IJ SOLN
INTRAMUSCULAR | Status: AC
  Filled 2014-10-14: qty 4

## 2014-10-14 MED ORDER — FENTANYL CITRATE 0.05 MG/ML IJ SOLN
INTRAMUSCULAR | Status: AC
  Filled 2014-10-14: qty 4

## 2014-10-14 MED ORDER — MIDAZOLAM HCL 2 MG/2ML IJ SOLN
INTRAMUSCULAR | Status: AC | PRN
  Administered 2014-10-14: 1 mg via INTRAVENOUS
  Administered 2014-10-14: 0.5 mg via INTRAVENOUS

## 2014-10-14 MED ORDER — FENTANYL CITRATE 0.05 MG/ML IJ SOLN
INTRAMUSCULAR | Status: AC | PRN
  Administered 2014-10-14: 25 ug via INTRAVENOUS
  Administered 2014-10-14: 50 ug via INTRAVENOUS

## 2014-10-14 MED ORDER — MORPHINE SULFATE 2 MG/ML IJ SOLN
2.0000 mg | Freq: Once | INTRAMUSCULAR | Status: AC
  Administered 2014-10-14: 2 mg via INTRAVENOUS
  Filled 2014-10-14: qty 1

## 2014-10-14 NOTE — Sedation Documentation (Signed)
Patient is resting comfortably. 

## 2014-10-14 NOTE — Progress Notes (Signed)
Patient ID: Bradley [name], male   DOB: October 24, 1950, 64 y.o.   MRN: [id number]    Subjective: Pt feels about the same as last night  Objective: Vital signs in last 24 hours: Temp:  [98.2 F (36.8 C)-98.7 F (37.1 C)] 98.7 F (37.1 C) (02/25 0920) Pulse Rate:  [71-94] 71 (02/25 0920) Resp:  [18-26] 18 (02/25 0920) BP: (106-159)/(36-80) 118/61 mmHg (02/25 0920) SpO2:  [91 %-97 %] 91 % (02/25 0920) Weight:  [222 lb (100.699 kg)] 222 lb (100.699 kg) (02/24 2126) Last BM Date: 10/09/14  Intake/Output from previous day: 02/24 0701 - 02/25 0700 In: 1280 [P.O.:80; I.V.:800; IV Piggyback:400] Out: 301 [Urine:301] Intake/Output this shift: Total I/O In: 200 [IV Piggyback:200] Out: -   PE: Abd: soft, tender in LLQ, +BS, obese Heart: regular Lungs: CTAB  Lab Results:   Recent Labs  10/13/14 1354 10/14/14 0520  WBC 11.3* 10.5  HGB 12.1* 11.1*  HCT 36.3* 34.0*  PLT 231 224   BMET  Recent Labs  10/13/14 1354 10/14/14 0520  NA 138 135  K 4.2 4.2  CL 102 101  CO2 27 25  GLUCOSE 106* 107*  BUN 18 12  CREATININE 1.34 0.96  CALCIUM 8.8 8.2*   PT/INR  Recent Labs  10/14/14 1117  LABPROT 16.2*  INR 1.29   CMP     Component Value Date/Time   NA 135 10/14/2014 0520   NA 139 06/17/2013 1114   K 4.2 10/14/2014 0520   K 4.2 06/17/2013 1114   CL 101 10/14/2014 0520   CO2 25 10/14/2014 0520   CO2 21* 06/17/2013 1114   GLUCOSE 107* 10/14/2014 0520   GLUCOSE 126 06/17/2013 1114   BUN 12 10/14/2014 0520   BUN 12.9 06/17/2013 1114   CREATININE 0.96 10/14/2014 0520   CREATININE 1.1 06/17/2013 1114   CALCIUM 8.2* 10/14/2014 0520   CALCIUM 9.6 06/17/2013 1114   PROT 7.7 10/13/2014 1354   PROT 8.8* 06/17/2013 1114   ALBUMIN 3.0* 10/13/2014 1354   ALBUMIN 3.6 06/17/2013 1114   AST 11 10/13/2014 1354   AST 7 06/17/2013 1114   ALT 19 10/13/2014 1354   ALT 8 06/17/2013 1114   ALKPHOS 52 10/13/2014 1354   ALKPHOS 53 06/17/2013 1114   BILITOT 0.8 10/13/2014 1354    BILITOT 0.23 06/17/2013 1114   GFRNONAA 88* 10/14/2014 0520   GFRAA >90 10/14/2014 0520   Lipase     Component Value Date/Time   LIPASE 19 10/13/2014 1354       Studies/Results: Ct Abdomen Pelvis W Contrast  10/13/2014   CLINICAL DATA:  Pain. Fever, abdominal pain, and constipation for 4 days. Lower abdominal pain and distention.  EXAM: CT ABDOMEN AND PELVIS WITH CONTRAST  TECHNIQUE: Multidetector CT imaging of the abdomen and pelvis was performed using the standard protocol following bolus administration of intravenous contrast.  CONTRAST:  144mL OMNIPAQUE IOHEXOL 300 MG/ML  SOLN  COMPARISON:  11/13/2011  FINDINGS: Mild dependent changes in the lung bases.  Small focal area of fatty infiltration adjacent to the falciform ligament in the liver. No other focal lesions identified in the liver. Gallbladder, spleen, pancreas, adrenal glands, abdominal aorta, inferior vena cava, and retroperitoneal lymph nodes are unremarkable. Small cysts on both kidneys. No solid mass or hydronephrosis. Stomach and small bowel are not abnormally distended. There is focal wall thickening and prominent pericolonic inflammatory infiltration around the proximal sigmoid colon which extends up into the left upper quadrant. Inflammatory changes extend to the splenic flexure of the  colon. There is focal fluid collection measuring 3.8 x 4.4 cm diameter. Appearance is consistent with acute diverticulitis with developing pericolonic abscess. Suggest follow-up after resolution of acute process to exclude underlying colon neoplasm. Scattered diverticula throughout the colon. No colonic distention.  Pelvis: Appendix is normal. Prostate gland is enlarged. Bladder wall is not thickened. No free or loculated pelvic fluid collections. No pelvic mass or lymphadenopathy.  IMPRESSION: Changes of acute diverticulitis in the sigmoid colon, which extends up into the left upper quadrant. There is associated pericolonic inflammation and abscess  measuring up to 4.4 cm diameter.   Electronically Signed   By: Lucienne Capers M.D.   On: 10/13/2014 19:26   Dg Abd Acute W/chest  10/13/2014   CLINICAL DATA:  64 year old male with a history of abdominal pain, constipation for 4 days. Fever  EXAM: ACUTE ABDOMEN SERIES (ABDOMEN 2 VIEW & CHEST 1 VIEW)  COMPARISON:  Plain film 11/12/2013, CT 11/13/2011  FINDINGS: Chest:  Cardiomediastinal silhouette within normal limits in size and contour  New airspace opacity at the medial right base, not present on the comparison plain film. This appears to be within the right lower lobe. Blunting of the left costophrenic angle.  No pulmonary vascular congestion or pneumothorax. Thickening of the rib minor fissure.  Abdomen:  Borderline dilated small bowel loops within the left abdomen.  Formed stool within the right and left colon. No gas within the rectum. Minimal gas within the stomach.  No unexpected calcifications. Unremarkable appearance of the liver silhouette, spleen silhouette, and left and right renal silhouette.  No displaced fracture.  Pseudoarticulation of the right L5 transverse process with the sacrum.  IMPRESSION: Chest:  New airspace opacity at the medial right base, concerning for infection given the patient's history.  Abdomen:  Nonspecific bowel gas pattern with borderline dilated small bowel loops in the left abdomen. If there is concern for acute abdominal process, CT would be recommended.  Signed,  Dulcy Fanny. Earleen Newport, DO  [facility]  [facility]   Electronically Signed   By: Corrie Mckusick D.O.   On: 10/13/2014 15:29    Anti-infectives: Anti-infectives    Start     Dose/Rate Route Frequency Ordered Stop   10/14/14 0900  ciprofloxacin (CIPRO) IVPB 400 mg     400 mg 200 mL/hr over 60 Minutes Intravenous Every 12 hours 10/13/14 2144     10/13/14 2200  metroNIDAZOLE (FLAGYL) IVPB 500 mg     500 mg 100 mL/hr over 60 Minutes Intravenous Every 8 hours  10/13/14 2123     10/13/14 2000  ciprofloxacin (CIPRO) IVPB 400 mg  Status:  Discontinued     400 mg 200 mL/hr over 60 Minutes Intravenous  Once 10/13/14 1957 10/13/14 2123   10/13/14 2000  metroNIDAZOLE (FLAGYL) IVPB 500 mg     500 mg 100 mL/hr over 60 Minutes Intravenous  Once 10/13/14 1957 10/13/14 2109       Assessment/Plan  1. Diverticulitis with abscess 2. DM  Plan: 1. Patient feels about the same today.  IR to plan for drain either today or tomorrow 2. Cont conservative management with abx therapy. 3. Cont NPO for at least today.  If he gets his drain and he is improving, then maybe clear liquids tomorrow.  Will follow.   LOS: 1 day    Delisha Peaden E 10/14/2014, 1:40 PM Pager: [phone]

## 2014-10-14 NOTE — Progress Notes (Signed)
[facility] Progress Note   Bradley [name] XFG:[id number] DOB: 09/20/1950 DOA: 10/13/2014 PCP: Benito Mccreedy, MD  Brief narrative: Bradley [name] is a 64 y.o. male With history of bipolar disorder, tobacco dependence, hypertension, hyperlipidemia. Presents to the hospital complaining of 4 days of abdominal discomfort and fever and is found to have diverticulitis with a diverticular abscess   Subjective: Presented with diffuse abdominal pain which she states is much improved today. He does not have any nausea vomiting and has not had any diarrhea.  Assessment/Plan: Principal Problem:   Diverticulitis/  Abscess - CT reveals acute diverticulitis in the sigmoid colon extending to the left upper quadrant with pericolonic inflammation and abscess measuring 4.4 cm - Continue nothing by mouth status and Cipro and Flagyl-surgery has evaluated the patient and are recommending an IR consult to drain abscess which I have ordered this morning  Active Problems:   Hyperlipemia -Continue Pravachol  Hypertension -Continue amlodipine and lisinopril   Code Status: Full code Family Communication:  Disposition Plan: Consult IR for abscess drainage today DVT prophylaxis: SCDs- will place on Lovenox once abscess drained further procedures needed  Consultants: General surgery IR  Procedures:   Antibiotics: Anti-infectives    Start     Dose/Rate Route Frequency Ordered Stop   10/14/14 0900  ciprofloxacin (CIPRO) IVPB 400 mg     400 mg 200 mL/hr over 60 Minutes Intravenous Every 12 hours 10/13/14 2144     10/13/14 2200  metroNIDAZOLE (FLAGYL) IVPB 500 mg     500 mg 100 mL/hr over 60 Minutes Intravenous Every 8 hours 10/13/14 2123     10/13/14 2000  ciprofloxacin (CIPRO) IVPB 400 mg  Status:  Discontinued     400 mg 200 mL/hr over 60 Minutes Intravenous  Once 10/13/14 1957 10/13/14 2123   10/13/14 2000  metroNIDAZOLE (FLAGYL) IVPB 500 mg     500 mg 100 mL/hr over 60 Minutes  Intravenous  Once 10/13/14 1957 10/13/14 2109         Objective: Filed Weights   10/13/14 2126  Weight: 100.699 kg (222 lb)    Intake/Output Summary (Last 24 hours) at 10/14/14 1135 Last data filed at 10/14/14 9678  Gross per 24 hour  Intake   1280 ml  Output    301 ml  Net    979 ml     Vitals Filed Vitals:   10/13/14 2100 10/13/14 2126 10/14/14 0702 10/14/14 0920  BP: 130/53 159/74 120/60 118/61  Pulse: 94 80 84 71  Temp:  98.6 F (37 C) 98.5 F (36.9 C) 98.7 F (37.1 C)  TempSrc:  Oral Oral Oral  Resp:    18  Height:  5\' 8"  (1.727 m)    Weight:  100.699 kg (222 lb)    SpO2: 96% 96% 95% 91%    Exam: General: Awake alert oriented 3 No acute respiratory distress Lungs: Clear to auscultation bilaterally without wheezes or crackles Cardiovascular: Regular rate and rhythm without murmur gallop or rub normal S1 and S2 Abdomen: Mild diffuse tenderness with more prominent tenderness in left lower quadrant, nondistended, soft, bowel sounds positive, no rebound, no ascites, no appreciable mass Extremities: No significant cyanosis, clubbing, or edema bilateral lower extremities  Data Reviewed: Basic Metabolic Panel:  Recent Labs Lab 10/13/14 1354 10/14/14 0520  NA 138 135  K 4.2 4.2  CL 102 101  CO2 27 25  GLUCOSE 106* 107*  BUN 18 12  CREATININE 1.34 0.96  CALCIUM 8.8 8.2*   Liver Function Tests:  Recent  Labs Lab 10/13/14 1354  AST 11  ALT 19  ALKPHOS 52  BILITOT 0.8  PROT 7.7  ALBUMIN 3.0*    Recent Labs Lab 10/13/14 1354  LIPASE 19   No results for input(s): AMMONIA in the last 168 hours. CBC:  Recent Labs Lab 10/13/14 1354 10/14/14 0520  WBC 11.3* 10.5  NEUTROABS 7.1  --   HGB 12.1* 11.1*  HCT 36.3* 34.0*  MCV 86.4 86.7  PLT 231 224   Cardiac Enzymes: No results for input(s): CKTOTAL, CKMB, CKMBINDEX, TROPONINI in the last 168 hours. BNP (last 3 results) No results for input(s): BNP in the last 8760 hours.  ProBNP (last 3  results) No results for input(s): PROBNP in the last 8760 hours.  CBG:  Recent Labs Lab 10/13/14 2141 10/14/14 0648  GLUCAP 87 119*    No results found for this or any previous visit (from the past 240 hour(s)).   Studies:  Recent x-ray studies have been reviewed in detail by the Attending Physician  Scheduled Meds:  Scheduled Meds: . amLODipine  10 mg Oral Daily  . ciprofloxacin  400 mg Intravenous Q12H  . divalproex  1,000 mg Oral QHS  . lisinopril  40 mg Oral Daily  . metronidazole  500 mg Intravenous Q8H  . pravastatin  80 mg Oral QHS  . risperidone  4 mg Oral BID   Continuous Infusions: . dextrose 5 % and 0.45 % NaCl with KCl 20 mEq/L 100 mL/hr at 10/13/14 2226    Time spent on care of this patient: 35 minutes [place], MD 10/14/2014, 11:35 AM  LOS: 1 day   [facility] Office  [phone] Pager - Text Page per [email or web address]  If 7PM-7AM, please contact night-coverage [email or web address]

## 2014-10-14 NOTE — Progress Notes (Signed)
At 2050 pt states pain continues to L abdomen. No other PRN meds available at this time. Paged Dr regarding pt concern and request for additional pain med management.

## 2014-10-14 NOTE — Sedation Documentation (Signed)
Patient is resting comfortably.  Dr. Jarvis Newcomer sutured in place

## 2014-10-14 NOTE — Sedation Documentation (Signed)
Drain sutured in place by MD. Pt tolerating well, eyes closed.

## 2014-10-14 NOTE — Sedation Documentation (Addendum)
Dr. Vernard Gambles administering more local anesthetic. Pt moaning some with movement of catheter.

## 2014-10-14 NOTE — Progress Notes (Signed)
Utilization review completed. Cyanne Delmar, RN, BSN. 

## 2014-10-14 NOTE — Consult Note (Signed)
Reason for consult: Left pelvic abscess drainage  Referring Physician(s): CCS  History of Present Illness: Bradley [name] is a 64 y.o. male recently admitted with four-day history of abdominal pain ,primarily left lower quadrant and fever. Subsequent CT scan of the abdomen and pelvis on 10/13/2014 revealed changes of acute diverticulitis in the sigmoid colon which extended up into the left upper quadrant. There is associated pericolonic inflammation and abscess measuring up to 4.4 cm. Request now received from CCS for CT guided drainage of the abdomino-pelvic abscess.  Past Medical History  Diagnosis Date  . Hypertension   . Hyperlipemia   . Bipolar 2 disorder   . Hypogonadism male   . Thrombocytopenia, unspecified 06/17/2013  . Diabetes mellitus without complication     Past Surgical History  Procedure Laterality Date  . Colonoscopy      Allergies: Review of patient's allergies indicates no known allergies.  Medications: Prior to Admission medications   Medication Sig Start Date End Date Taking? Authorizing Provider  amLODipine (NORVASC) 10 MG tablet Take 10 mg by mouth daily.   Yes Historical Provider, MD  divalproex (DEPAKOTE ER) 500 MG 24 hr tablet Take 1,000 mg by mouth at bedtime.   Yes Historical Provider, MD  HYDROcodone-acetaminophen (NORCO/VICODIN) 5-325 MG per tablet Take 1-2 tablets every 6 hours as needed for severe pain Patient not taking: Reported on 10/13/2014 03/27/14   Carlisle Cater, PA-C  lisinopril (PRINIVIL,ZESTRIL) 40 MG tablet Take 40 mg by mouth daily.   Yes Historical Provider, MD  metFORMIN (GLUCOPHAGE) 500 MG tablet Take 500 mg by mouth daily. 05/26/13  Yes Historical Provider, MD  naproxen (NAPROSYN) 500 MG tablet Take 1 tablet (500 mg total) by mouth 2 (two) times daily. Patient not taking: Reported on 10/13/2014 03/27/14   Carlisle Cater, PA-C  penicillin v potassium (VEETID) 500 MG tablet Take 1 tablet (500 mg total) by mouth 3 (three) times  daily. Patient not taking: Reported on 10/13/2014 03/27/14   Carlisle Cater, PA-C  pravastatin (PRAVACHOL) 80 MG tablet Take 80 mg by mouth daily.   Yes Historical Provider, MD  risperidone (RISPERDAL) 4 MG tablet Take 4 mg by mouth 2 (two) times daily.   Yes Historical Provider, MD    Family History  Problem Relation Age of Onset  . Cancer Brother     abdominal CA  . Colon cancer Neg Hx     History   Social History  . Marital Status: Divorced    Spouse Name: N/A  . Number of Children: N/A  . Years of Education: N/A   Social History Main Topics  . Smoking status: Current Every Day Smoker -- 1.00 packs/day for 0 years    Types: Cigarettes  . Smokeless tobacco: Never Used  . Alcohol Use: No  . Drug Use: No  . Sexual Activity: Not on file   Other Topics Concern  . None   Social History Narrative       Review of Systems patient currently denies fever, chills, chest pain, dyspnea, cough, nausea, vomiting, bleeding. Only complaint at this time is mild left greater than right abdominal discomfort.  Vital Signs: BP 118/61 mmHg  Pulse 71  Temp(Src) 98.7 F (37.1 C) (Oral)  Resp 18  Ht 5\' 8"  (1.727 m)  Wt 222 lb (100.699 kg)  BMI 33.76 kg/m2  SpO2 91%  Physical Exam patient awake, alert. Chest with few scattered rhonchi and wheezes, slightly diminished breath sounds right base. Heart with regular rate and rhythm. Abdomen soft,  mildly distended, positive bowel sounds, mild tenderness to palpation in left greater than right lower quadrants. Extremities with no significant edema.   Imaging: Ct Abdomen Pelvis W Contrast  10/13/2014   CLINICAL DATA:  Pain. Fever, abdominal pain, and constipation for 4 days. Lower abdominal pain and distention.  EXAM: CT ABDOMEN AND PELVIS WITH CONTRAST  TECHNIQUE: Multidetector CT imaging of the abdomen and pelvis was performed using the standard protocol following bolus administration of intravenous contrast.  CONTRAST:  172mL OMNIPAQUE IOHEXOL  300 MG/ML  SOLN  COMPARISON:  11/13/2011  FINDINGS: Mild dependent changes in the lung bases.  Small focal area of fatty infiltration adjacent to the falciform ligament in the liver. No other focal lesions identified in the liver. Gallbladder, spleen, pancreas, adrenal glands, abdominal aorta, inferior vena cava, and retroperitoneal lymph nodes are unremarkable. Small cysts on both kidneys. No solid mass or hydronephrosis. Stomach and small bowel are not abnormally distended. There is focal wall thickening and prominent pericolonic inflammatory infiltration around the proximal sigmoid colon which extends up into the left upper quadrant. Inflammatory changes extend to the splenic flexure of the colon. There is focal fluid collection measuring 3.8 x 4.4 cm diameter. Appearance is consistent with acute diverticulitis with developing pericolonic abscess. Suggest follow-up after resolution of acute process to exclude underlying colon neoplasm. Scattered diverticula throughout the colon. No colonic distention.  Pelvis: Appendix is normal. Prostate gland is enlarged. Bladder wall is not thickened. No free or loculated pelvic fluid collections. No pelvic mass or lymphadenopathy.  IMPRESSION: Changes of acute diverticulitis in the sigmoid colon, which extends up into the left upper quadrant. There is associated pericolonic inflammation and abscess measuring up to 4.4 cm diameter.   Electronically Signed   By: Lucienne Capers M.D.   On: 10/13/2014 19:26   Dg Abd Acute W/chest  10/13/2014   CLINICAL DATA:  64 year old male with a history of abdominal pain, constipation for 4 days. Fever  EXAM: ACUTE ABDOMEN SERIES (ABDOMEN 2 VIEW & CHEST 1 VIEW)  COMPARISON:  Plain film 11/12/2013, CT 11/13/2011  FINDINGS: Chest:  Cardiomediastinal silhouette within normal limits in size and contour  New airspace opacity at the medial right base, not present on the comparison plain film. This appears to be within the right lower lobe.  Blunting of the left costophrenic angle.  No pulmonary vascular congestion or pneumothorax. Thickening of the rib minor fissure.  Abdomen:  Borderline dilated small bowel loops within the left abdomen.  Formed stool within the right and left colon. No gas within the rectum. Minimal gas within the stomach.  No unexpected calcifications. Unremarkable appearance of the liver silhouette, spleen silhouette, and left and right renal silhouette.  No displaced fracture.  Pseudoarticulation of the right L5 transverse process with the sacrum.  IMPRESSION: Chest:  New airspace opacity at the medial right base, concerning for infection given the patient's history.  Abdomen:  Nonspecific bowel gas pattern with borderline dilated small bowel loops in the left abdomen. If there is concern for acute abdominal process, CT would be recommended.  Signed,  Dulcy Fanny. Earleen Newport, DO  [facility]  [facility]   Electronically Signed   By: Corrie Mckusick D.O.   On: 10/13/2014 15:29    Labs:  CBC:  Recent Labs  01/22/14 1246 10/13/14 1354 10/14/14 0520  WBC 6.6 11.3* 10.5  HGB 14.2 12.1* 11.1*  HCT 43.8 36.3* 34.0*  PLT 139* 231 224    COAGS: No results for input(s):  INR, APTT in the last 8760 hours.  BMP:  Recent Labs  10/13/14 1354 10/14/14 0520  NA 138 135  K 4.2 4.2  CL 102 101  CO2 27 25  GLUCOSE 106* 107*  BUN 18 12  CALCIUM 8.8 8.2*  CREATININE 1.34 0.96  GFRNONAA 56* 88*  GFRAA 64* >90    LIVER FUNCTION TESTS:  Recent Labs  10/13/14 1354  BILITOT 0.8  AST 11  ALT 19  ALKPHOS 52  PROT 7.7  ALBUMIN 3.0*    TUMOR MARKERS: No results for input(s): AFPTM, CEA, CA199, CHROMGRNA in the last 8760 hours.  Assessment and Plan: Patient with recent history of abdominal pain, fever, and CT findings of left pelvic/diverticular abscess. Request now received for CT guided drainage of the abdomino-pelvic abscess. Patient is currently afebrile with white  count of 10.5. Imaging studies have been reviewed and abscess is amenable to drainage. Details/risks of procedure, including but not limited to, internal bleeding, infection/sepsis and possible need for surgery discussed with the patient with his apparent understanding and consent. Procedure will be tentatively scheduled for later this afternoon or potentially tomorrow morning.    Signed: Autumn Messing 10/14/2014, 11:32 AM   I spent a total of 20 minutes face to face in clinical consultation, greater than 50% of which was counseling/coordinating care for left pelvic abscess drainage

## 2014-10-14 NOTE — Plan of Care (Signed)
Problem: Phase I Progression Outcomes Goal: Pain controlled with appropriate interventions Outcome: Progressing Needed to call on call MD this shift to obtain addtl meds for pain relief. Once meds administered + effects noted.    Goal: OOB as tolerated unless otherwise ordered Outcome: Completed/Met Date Met:  10/14/14 Bedrest w/ BRP orders. Does attend BRP when needed. Does not call for help getting OOB but staff enters to assist w/ ambulation. Bed alarm on.

## 2014-10-14 NOTE — Procedures (Signed)
61f pigtail drain into L pericolonic abscess 2ml purulent material out, sample for GS, C&S No complication No blood loss. See complete dictation in [facility].

## 2014-10-15 LAB — GLUCOSE, CAPILLARY
GLUCOSE-CAPILLARY: 117 mg/dL — AB (ref 70–99)
Glucose-Capillary: 116 mg/dL — ABNORMAL HIGH (ref 70–99)
Glucose-Capillary: 127 mg/dL — ABNORMAL HIGH (ref 70–99)
Glucose-Capillary: 113 mg/dL — ABNORMAL HIGH (ref 70–99)

## 2014-10-15 MED ORDER — DOCUSATE SODIUM 100 MG PO CAPS
100.0000 mg | ORAL_CAPSULE | Freq: Two times a day (BID) | ORAL | Status: DC
  Administered 2014-10-15 – 2014-10-17 (×5): 100 mg via ORAL
  Filled 2014-10-15 (×5): qty 1

## 2014-10-15 MED ORDER — HYDROCODONE-ACETAMINOPHEN 5-325 MG PO TABS: 1.0000 | ORAL_TABLET | ORAL | Status: DC | PRN

## 2014-10-15 MED ORDER — INSULIN ASPART 100 UNIT/ML ~~LOC~~ SOLN: 0.0000 [IU] | Freq: Three times a day (TID) | SUBCUTANEOUS | Status: DC

## 2014-10-15 MED ORDER — INSULIN ASPART 100 UNIT/ML ~~LOC~~ SOLN: 0.0000 [IU] | Freq: Every day | SUBCUTANEOUS | Status: DC

## 2014-10-15 NOTE — Progress Notes (Signed)
I noted that a clear liquid diet was ordered yesterday under my name- it should be noted that I was not consulted in this decision to start this diet.  Debbe Odea, MD

## 2014-10-15 NOTE — Progress Notes (Signed)
[facility] Progress Note     Subjective: Pt feels better today.  No N/V, not very thirsty/hungry yet, but willing to try clear liquids.  Some flatus, no BM since 10/09/14.  Not much pain.  Has pain at new drain site.  Drain with good output.  Patient has urinated on himself.    Objective: Vital signs in last 24 hours: Temp:  [98.3 F (36.8 C)-100 F (37.8 C)] 98.8 F (37.1 C) (02/26 0527) Pulse Rate:  [71-88] 76 (02/26 0527) Resp:  [17-21] 20 (02/26 0527) BP: (110-133)/(45-74) 124/56 mmHg (02/26 0527) SpO2:  [91 %-98 %] 93 % (02/26 0527) Last BM Date: 10/09/14  Intake/Output from previous day: 02/25 0701 - 02/26 0700 In: 2768.3 [P.O.:850; I.V.:1318.3; IV Piggyback:600] Out: 28 [Drains:28] Intake/Output this shift:    PE: Gen:  Alert, NAD, pleasant Abd: Soft, minimally tender, ND, +BS, no HSM, IR drain with sanguinous purulent drainage (19mL/24hr)   Lab Results:   Recent Labs  10/13/14 1354 10/14/14 0520  WBC 11.3* 10.5  HGB 12.1* 11.1*  HCT 36.3* 34.0*  PLT 231 224   BMET  Recent Labs  10/13/14 1354 10/14/14 0520  NA 138 135  K 4.2 4.2  CL 102 101  CO2 27 25  GLUCOSE 106* 107*  BUN 18 12  CREATININE 1.34 0.96  CALCIUM 8.8 8.2*   PT/INR  Recent Labs  10/14/14 1117  LABPROT 16.2*  INR 1.29   CMP     Component Value Date/Time   NA 135 10/14/2014 0520   NA 139 06/17/2013 1114   K 4.2 10/14/2014 0520   K 4.2 06/17/2013 1114   CL 101 10/14/2014 0520   CO2 25 10/14/2014 0520   CO2 21* 06/17/2013 1114   GLUCOSE 107* 10/14/2014 0520   GLUCOSE 126 06/17/2013 1114   BUN 12 10/14/2014 0520   BUN 12.9 06/17/2013 1114   CREATININE 0.96 10/14/2014 0520   CREATININE 1.1 06/17/2013 1114   CALCIUM 8.2* 10/14/2014 0520   CALCIUM 9.6 06/17/2013 1114   PROT 7.7 10/13/2014 1354   PROT 8.8* 06/17/2013 1114   ALBUMIN 3.0* 10/13/2014 1354   ALBUMIN 3.6 06/17/2013 1114   AST 11 10/13/2014 1354   AST 7 06/17/2013 1114   ALT 19 10/13/2014 1354    ALT 8 06/17/2013 1114   ALKPHOS 52 10/13/2014 1354   ALKPHOS 53 06/17/2013 1114   BILITOT 0.8 10/13/2014 1354   BILITOT 0.23 06/17/2013 1114   GFRNONAA 88* 10/14/2014 0520   GFRAA >90 10/14/2014 0520   Lipase     Component Value Date/Time   LIPASE 19 10/13/2014 1354       Studies/Results: Ct Abdomen Pelvis W Contrast  10/13/2014   CLINICAL DATA:  Pain. Fever, abdominal pain, and constipation for 4 days. Lower abdominal pain and distention.  EXAM: CT ABDOMEN AND PELVIS WITH CONTRAST  TECHNIQUE: Multidetector CT imaging of the abdomen and pelvis was performed using the standard protocol following bolus administration of intravenous contrast.  CONTRAST:  164mL OMNIPAQUE IOHEXOL 300 MG/ML  SOLN  COMPARISON:  11/13/2011  FINDINGS: Mild dependent changes in the lung bases.  Small focal area of fatty infiltration adjacent to the falciform ligament in the liver. No other focal lesions identified in the liver. Gallbladder, spleen, pancreas, adrenal glands, abdominal aorta, inferior vena cava, and retroperitoneal lymph nodes are unremarkable. Small cysts on both kidneys. No solid mass or hydronephrosis. Stomach and small bowel are not abnormally distended. There is focal wall thickening and prominent pericolonic inflammatory infiltration  around the proximal sigmoid colon which extends up into the left upper quadrant. Inflammatory changes extend to the splenic flexure of the colon. There is focal fluid collection measuring 3.8 x 4.4 cm diameter. Appearance is consistent with acute diverticulitis with developing pericolonic abscess. Suggest follow-up after resolution of acute process to exclude underlying colon neoplasm. Scattered diverticula throughout the colon. No colonic distention.  Pelvis: Appendix is normal. Prostate gland is enlarged. Bladder wall is not thickened. No free or loculated pelvic fluid collections. No pelvic mass or lymphadenopathy.  IMPRESSION: Changes of acute diverticulitis in the  sigmoid colon, which extends up into the left upper quadrant. There is associated pericolonic inflammation and abscess measuring up to 4.4 cm diameter.   Electronically Signed   By: Lucienne Capers M.D.   On: 10/13/2014 19:26   Dg Abd Acute W/chest  10/13/2014   CLINICAL DATA:  64 year old male with a history of abdominal pain, constipation for 4 days. Fever  EXAM: ACUTE ABDOMEN SERIES (ABDOMEN 2 VIEW & CHEST 1 VIEW)  COMPARISON:  Plain film 11/12/2013, CT 11/13/2011  FINDINGS: Chest:  Cardiomediastinal silhouette within normal limits in size and contour  New airspace opacity at the medial right base, not present on the comparison plain film. This appears to be within the right lower lobe. Blunting of the left costophrenic angle.  No pulmonary vascular congestion or pneumothorax. Thickening of the rib minor fissure.  Abdomen:  Borderline dilated small bowel loops within the left abdomen.  Formed stool within the right and left colon. No gas within the rectum. Minimal gas within the stomach.  No unexpected calcifications. Unremarkable appearance of the liver silhouette, spleen silhouette, and left and right renal silhouette.  No displaced fracture.  Pseudoarticulation of the right L5 transverse process with the sacrum.  IMPRESSION: Chest:  New airspace opacity at the medial right base, concerning for infection given the patient's history.  Abdomen:  Nonspecific bowel gas pattern with borderline dilated small bowel loops in the left abdomen. If there is concern for acute abdominal process, CT would be recommended.  Signed,  Dulcy Fanny. Earleen Newport, DO  [facility]  [facility]   Electronically Signed   By: Corrie Mckusick D.O.   On: 10/13/2014 15:29   Ct Image Guided Drainage By Percutaneous Catheter  10/14/2014   CLINICAL DATA:  Probable diverticular abscess  EXAM: CT GUIDED DRAINAGE OF LEFT RETROPERITONEAL ABSCESS  ANESTHESIA/SEDATION: Intravenous Fentanyl and Versed were  administered as conscious sedation during continuous cardiorespiratory monitoring by the radiology RN, with a total moderate sedation time of 20 minutes.  PROCEDURE: The procedure, risks, benefits, and alternatives were explained to the patient. Questions regarding the procedure were encouraged and answered. The patient understands and consents to the procedure.  Select axial scans through the abdomen were obtained. The collection was localized, an appropriate skin site determined and marked.  The operative field was prepped with Betadinein a sterile fashion, and a sterile drape was applied covering the operative field. A sterile gown and sterile gloves were used for the procedure. Local anesthesia was provided with 1% Lidocaine.  Under CT fluoroscopic guidance, an 18 gauge trocar needle was advanced into the collection. Purulent material could be aspirated. Amplatz guidewire advanced easily, its position confirmed on CT fluoroscopy. Tract was dilated to facilitate placement of a 12 French pigtail drain, formed centrally within the collection. Drain positioning confirmed on CT. Catheter secured externally with 0 Prolene suture and StatLock, and placed to a gravity drain bag. Approximately 28  mL of purulent aspirate were sent for routine Gram stain and culture. The patient tolerated the procedure well.  COMPLICATIONS: None immediate  FINDINGS: CT confirmed the left retroperitoneal inflammatory process associated with the distal descending and proximal sigmoid portions of the colon. Percutaneous drain catheter placement performed under CT fluoroscopic guidance.  IMPRESSION: 1. Technically successful CT guided left retroperitoneal abscess drain catheter placement.   Electronically Signed   By: Lucrezia Europe M.D.   On: 10/14/2014 17:48    Anti-infectives: Anti-infectives    Start     Dose/Rate Route Frequency Ordered Stop   10/14/14 0900  ciprofloxacin (CIPRO) IVPB 400 mg     400 mg 200 mL/hr over 60 Minutes  Intravenous Every 12 hours 10/13/14 2144     10/13/14 2200  metroNIDAZOLE (FLAGYL) IVPB 500 mg     500 mg 100 mL/hr over 60 Minutes Intravenous Every 8 hours 10/13/14 2123     10/13/14 2000  ciprofloxacin (CIPRO) IVPB 400 mg  Status:  Discontinued     400 mg 200 mL/hr over 60 Minutes Intravenous  Once 10/13/14 1957 10/13/14 2123   10/13/14 2000  metroNIDAZOLE (FLAGYL) IVPB 500 mg     500 mg 100 mL/hr over 60 Minutes Intravenous  Once 10/13/14 1957 10/13/14 2109       Assessment/Plan Diverticulitis with abscess -Patient feels better today. IR completed successful perc drain of abscess on 10/14/14. -Cont conservative management with abx therapy. -Start clears, advance to fulls at dinner if tolerating -Ambulate and IS -CBC in am DM  VTE proph  -SCD's and resume heparin or lovenox per primary    LOS: 2 days    DORT, Teghan Philbin 10/15/2014, 9:14 AM Pager: [phone]

## 2014-10-16 DIAGNOSIS — I10 Essential (primary) hypertension: Secondary | ICD-10-CM

## 2014-10-16 LAB — GLUCOSE, CAPILLARY
Glucose-Capillary: 106 mg/dL — ABNORMAL HIGH (ref 70–99)
Glucose-Capillary: 114 mg/dL — ABNORMAL HIGH (ref 70–99)
Glucose-Capillary: 143 mg/dL — ABNORMAL HIGH (ref 70–99)
Glucose-Capillary: 127 mg/dL — ABNORMAL HIGH (ref 70–99)

## 2014-10-16 LAB — CBC
HCT: 31.5 % — ABNORMAL LOW (ref 39.0–52.0)
Hemoglobin: 10.3 g/dL — ABNORMAL LOW (ref 13.0–17.0)
MCH: 28 pg (ref 26.0–34.0)
MCHC: 32.7 g/dL (ref 30.0–36.0)
MCV: 85.6 fL (ref 78.0–100.0)
PLATELETS: 229 10*3/uL (ref 150–400)
RBC: 3.68 MIL/uL — ABNORMAL LOW (ref 4.22–5.81)
RDW: 14.3 % (ref 11.5–15.5)
WBC: 9.3 10*3/uL (ref 4.0–10.5)

## 2014-10-16 MED ORDER — ENOXAPARIN SODIUM 40 MG/0.4ML ~~LOC~~ SOLN
40.0000 mg | SUBCUTANEOUS | Status: DC
  Administered 2014-10-16 – 2014-10-17 (×2): 40 mg via SUBCUTANEOUS
  Filled 2014-10-16 (×2): qty 0.4

## 2014-10-16 NOTE — Progress Notes (Signed)
Patient ID: Bradley [name], male   DOB: 11/21/1950, 64 y.o.   MRN: [id number]   LOS: 3 days   Subjective: Pain at drain site but otherwise ok. Tolerated full liquid diet without problem.   Objective: Vital signs in last 24 hours: Temp:  [98.6 F (37 C)-98.7 F (37.1 C)] 98.6 F (37 C) (02/27 0618) Pulse Rate:  [60-66] 66 (02/27 0618) Resp:  [18-20] 18 (02/27 0618) BP: (101-121)/(50-69) 101/50 mmHg (02/27 0618) SpO2:  [85 %-97 %] 92 % (02/27 0618) Last BM Date: 10/09/14   Physical Exam General appearance: alert and no distress Resp: clear to auscultation bilaterally Cardio: regular rate and rhythm GI: normal findings: bowel sounds normal and soft, non-tender except at drain site   Assessment/Plan: Diverticulitis with abscess s/p perc drain -- Give regular diet today. Could d/c tomorrow if tolerates.    Lisette Abu, PA-C Pager: [phone] 10/16/2014

## 2014-10-16 NOTE — Progress Notes (Signed)
[facility] Progress Note   Bradley [name] OAC:[id number] DOB: [birth year] DOA: 10/13/2014 PCP: Benito Mccreedy, MD  Brief narrative: Bradley [name] is a 64 y.o. male With history of bipolar disorder, tobacco dependence, hypertension, hyperlipidemia. Presents to the hospital complaining of 4 days of abdominal discomfort and fever and is found to have diverticulitis with a diverticular abscess   Subjective: Tolerating full liquid diet- no abdominal pain  Assessment/Plan: Principal Problem:   Diverticulitis/  Abscess - CT reveals acute diverticulitis in the sigmoid colon extending to the left upper quadrant with pericolonic inflammation and abscess measuring 4.4 cm - s/p IR drainage- 12 cc of pus- cont clears and antibiotics- culture reveals gr neg rods - cont drain per IR - being advanced to solids by surgery  Active Problems:   Hyperlipemia -Continue Pravachol  Hypertension -Continue amlodipine and lisinopril   Code Status: Full code Family Communication:  Disposition Plan:  DVT prophylaxis: SCDs- will place on Lovenox now that IR drainage done  Consultants: General surgery IR  Procedures:   Antibiotics: Anti-infectives    Start     Dose/Rate Route Frequency Ordered Stop   10/14/14 0900  ciprofloxacin (CIPRO) IVPB 400 mg     400 mg 200 mL/hr over 60 Minutes Intravenous Every 12 hours 10/13/14 2144     10/13/14 2200  metroNIDAZOLE (FLAGYL) IVPB 500 mg     500 mg 100 mL/hr over 60 Minutes Intravenous Every 8 hours 10/13/14 2123     10/13/14 2000  ciprofloxacin (CIPRO) IVPB 400 mg  Status:  Discontinued     400 mg 200 mL/hr over 60 Minutes Intravenous  Once 10/13/14 1957 10/13/14 2123   10/13/14 2000  metroNIDAZOLE (FLAGYL) IVPB 500 mg     500 mg 100 mL/hr over 60 Minutes Intravenous  Once 10/13/14 1957 10/13/14 2109         Objective: Filed Weights   10/13/14 2126  Weight: 100.699 kg (222 lb)    Intake/Output Summary (Last 24 hours) at  10/16/14 1315 Last data filed at 10/16/14 1304  Gross per 24 hour  Intake   1125 ml  Output    550 ml  Net    575 ml     Vitals Filed Vitals:   10/15/14 2050 10/16/14 0618 10/16/14 1103 10/16/14 1303  BP: 117/61 101/50 139/67 141/61  Pulse: 60 66 66 76  Temp: 98.7 F (37.1 C) 98.6 F (37 C)  99.3 F (37.4 C)  TempSrc: Oral Oral  Oral  Resp: 20 18  18   Height:      Weight:      SpO2: 92% 92%  94%    Exam: General: Awake alert oriented 3 No acute respiratory distress Lungs: Clear to auscultation bilaterally without wheezes or crackles Cardiovascular: Regular rate and rhythm without murmur gallop or rub normal S1 and S2 Abdomen: mid abdominal tenderness, nondistended, soft, bowel sounds positive, no rebound, no ascites, no appreciable mass Extremities: No significant cyanosis, clubbing, or edema bilateral lower extremities  Data Reviewed: Basic Metabolic Panel:  Recent Labs Lab 10/13/14 1354 10/14/14 0520  NA 138 135  K 4.2 4.2  CL 102 101  CO2 27 25  GLUCOSE 106* 107*  BUN 18 12  CREATININE 1.34 0.96  CALCIUM 8.8 8.2*   Liver Function Tests:  Recent Labs Lab 10/13/14 1354  AST 11  ALT 19  ALKPHOS 52  BILITOT 0.8  PROT 7.7  ALBUMIN 3.0*    Recent Labs Lab 10/13/14 1354  LIPASE 19   No results for  input(s): AMMONIA in the last 168 hours. CBC:  Recent Labs Lab 10/13/14 1354 10/14/14 0520 10/16/14 0802  WBC 11.3* 10.5 9.3  NEUTROABS 7.1  --   --   HGB 12.1* 11.1* 10.3*  HCT 36.3* 34.0* 31.5*  MCV 86.4 86.7 85.6  PLT 231 224 229   Cardiac Enzymes: No results for input(s): CKTOTAL, CKMB, CKMBINDEX, TROPONINI in the last 168 hours. BNP (last 3 results) No results for input(s): BNP in the last 8760 hours.  ProBNP (last 3 results) No results for input(s): PROBNP in the last 8760 hours.  CBG:  Recent Labs Lab 10/15/14 1119 10/15/14 1640 10/15/14 2119 10/16/14 0615 10/16/14 1227  GLUCAP 116* 127* 113* 106* 114*    Recent Results  (from the past 240 hour(s))  Culture, routine-abscess     Status: None (Preliminary result)   Collection Time: 10/14/14  5:19 PM  Result Value Ref Range Status   Specimen Description ABSCESS ABDOMEN LEFT  Final   Special Requests Normal  Final   Gram Stain   Final    ABUNDANT WBC PRESENT,BOTH PMN AND MONONUCLEAR NO SQUAMOUS EPITHELIAL CELLS SEEN MODERATE GRAM NEGATIVE RODS MODERATE GRAM POSITIVE RODS FEW GRAM POSITIVE COCCI    Culture   Final    MODERATE GRAM NEGATIVE RODS Performed at [facility]    Report Status PENDING  Incomplete     Studies:  Recent x-ray studies have been reviewed in detail by the Attending Physician  Scheduled Meds:  Scheduled Meds: . amLODipine  10 mg Oral Daily  . ciprofloxacin  400 mg Intravenous Q12H  . divalproex  1,000 mg Oral QHS  . docusate sodium  100 mg Oral BID  . enoxaparin (LOVENOX) injection  40 mg Subcutaneous Q24H  . insulin aspart  0-5 Units Subcutaneous QHS  . insulin aspart  0-9 Units Subcutaneous TID WC  . lisinopril  40 mg Oral Daily  . metronidazole  500 mg Intravenous Q8H  . pravastatin  80 mg Oral QHS  . risperidone  4 mg Oral BID   Continuous Infusions: . dextrose 5 % and 0.45 % NaCl with KCl 20 mEq/L 100 mL/hr at 10/16/14 5953    Time spent on care of this patient: 35 minutes [place], MD 10/16/2014, 1:15 PM  LOS: 3 days   [facility] Office  [phone] Pager - Text Page per [email or web address]  If 7PM-7AM, please contact night-coverage [email or web address]

## 2014-10-16 NOTE — Progress Notes (Signed)
[facility] Progress Note   Bradley [name] NIO:[id number] DOB: [birth year] DOA: 10/13/2014 PCP: Benito Mccreedy, MD  Brief narrative: Bradley [name] is a 64 y.o. male With history of bipolar disorder, tobacco dependence, hypertension, hyperlipidemia. Presents to the hospital complaining of 4 days of abdominal discomfort and fever and is found to have diverticulitis with a diverticular abscess   Subjective: Tolerating clear liquid diet which he started last night. Abdominal pain is not severe. No nausea or diarrhea.   Assessment/Plan: Principal Problem:   Diverticulitis/  Abscess - CT reveals acute diverticulitis in the sigmoid colon extending to the left upper quadrant with pericolonic inflammation and abscess measuring 4.4 cm - s/p IR drainage- 12 cc of pus- cont clears and antibiotics- f/u culture  Active Problems:   Hyperlipemia -Continue Pravachol  Hypertension -Continue amlodipine and lisinopril   Code Status: Full code Family Communication:  Disposition Plan:  DVT prophylaxis: SCDs- will place on Lovenox now that IR drainage done  Consultants: General surgery IR  Procedures:   Antibiotics: Anti-infectives    Start     Dose/Rate Route Frequency Ordered Stop   10/14/14 0900  ciprofloxacin (CIPRO) IVPB 400 mg     400 mg 200 mL/hr over 60 Minutes Intravenous Every 12 hours 10/13/14 2144     10/13/14 2200  metroNIDAZOLE (FLAGYL) IVPB 500 mg     500 mg 100 mL/hr over 60 Minutes Intravenous Every 8 hours 10/13/14 2123     10/13/14 2000  ciprofloxacin (CIPRO) IVPB 400 mg  Status:  Discontinued     400 mg 200 mL/hr over 60 Minutes Intravenous  Once 10/13/14 1957 10/13/14 2123   10/13/14 2000  metroNIDAZOLE (FLAGYL) IVPB 500 mg     500 mg 100 mL/hr over 60 Minutes Intravenous  Once 10/13/14 1957 10/13/14 2109         Objective: Filed Weights   10/13/14 2126  Weight: 100.699 kg (222 lb)    Intake/Output Summary (Last 24 hours) at 10/16/14  0728 Last data filed at 10/15/14 1700  Gross per 24 hour  Intake    960 ml  Output      0 ml  Net    960 ml     Vitals Filed Vitals:   10/15/14 1100 10/15/14 1300 10/15/14 2050 10/16/14 0618  BP:  121/69 117/61 101/50  Pulse:  61 60 66  Temp:  98.7 F (37.1 C) 98.7 F (37.1 C) 98.6 F (37 C)  TempSrc:   Oral Oral  Resp:  20 20 18   Height:      Weight:      SpO2: 97% 90% 92% 92%    Exam: General: Awake alert oriented 3 No acute respiratory distress Lungs: Clear to auscultation bilaterally without wheezes or crackles Cardiovascular: Regular rate and rhythm without murmur gallop or rub normal S1 and S2 Abdomen: Mild diffuse tenderness with more prominent tenderness in left lower quadrant, nondistended, soft, bowel sounds positive, no rebound, no ascites, no appreciable mass Extremities: No significant cyanosis, clubbing, or edema bilateral lower extremities  Data Reviewed: Basic Metabolic Panel:  Recent Labs Lab 10/13/14 1354 10/14/14 0520  NA 138 135  K 4.2 4.2  CL 102 101  CO2 27 25  GLUCOSE 106* 107*  BUN 18 12  CREATININE 1.34 0.96  CALCIUM 8.8 8.2*   Liver Function Tests:  Recent Labs Lab 10/13/14 1354  AST 11  ALT 19  ALKPHOS 52  BILITOT 0.8  PROT 7.7  ALBUMIN 3.0*    Recent Labs Lab 10/13/14 1354  LIPASE 19   No results for input(s): AMMONIA in the last 168 hours. CBC:  Recent Labs Lab 10/13/14 1354 10/14/14 0520  WBC 11.3* 10.5  NEUTROABS 7.1  --   HGB 12.1* 11.1*  HCT 36.3* 34.0*  MCV 86.4 86.7  PLT 231 224   Cardiac Enzymes: No results for input(s): CKTOTAL, CKMB, CKMBINDEX, TROPONINI in the last 168 hours. BNP (last 3 results) No results for input(s): BNP in the last 8760 hours.  ProBNP (last 3 results) No results for input(s): PROBNP in the last 8760 hours.  CBG:  Recent Labs Lab 10/15/14 0638 10/15/14 1119 10/15/14 1640 10/15/14 2119 10/16/14 0615  GLUCAP 117* 116* 127* 113* 106*    Recent Results (from the  past 240 hour(s))  Culture, routine-abscess     Status: None (Preliminary result)   Collection Time: 10/14/14  5:19 PM  Result Value Ref Range Status   Specimen Description ABSCESS ABDOMEN LEFT  Final   Special Requests Normal  Final   Gram Stain   Final    ABUNDANT WBC PRESENT,BOTH PMN AND MONONUCLEAR NO SQUAMOUS EPITHELIAL CELLS SEEN MODERATE GRAM NEGATIVE RODS MODERATE GRAM POSITIVE RODS FEW GRAM POSITIVE COCCI    Culture PENDING  Incomplete   Report Status PENDING  Incomplete     Studies:  Recent x-ray studies have been reviewed in detail by the Attending Physician  Scheduled Meds:  Scheduled Meds: . amLODipine  10 mg Oral Daily  . ciprofloxacin  400 mg Intravenous Q12H  . divalproex  1,000 mg Oral QHS  . docusate sodium  100 mg Oral BID  . insulin aspart  0-5 Units Subcutaneous QHS  . insulin aspart  0-9 Units Subcutaneous TID WC  . lisinopril  40 mg Oral Daily  . metronidazole  500 mg Intravenous Q8H  . pravastatin  80 mg Oral QHS  . risperidone  4 mg Oral BID   Continuous Infusions: . dextrose 5 % and 0.45 % NaCl with KCl 20 mEq/L 100 mL/hr at 10/15/14 1429    Time spent on care of this patient: 35 minutes [place], MD 10/16/2014, 7:28 AM  LOS: 3 days   [facility] Office  [phone] Pager - Text Page per [email or web address]  If 7PM-7AM, please contact night-coverage [email or web address]

## 2014-10-16 NOTE — Progress Notes (Signed)
Referring Physician(s): [facility]  Subjective: L pericolonic abscess drain placed 2/25 Feeling better Resting now    Allergies: Review of patient's allergies indicates no known allergies.  Medications: Prior to Admission medications   Medication Sig Start Date End Date Taking? Authorizing Provider  amLODipine (NORVASC) 10 MG tablet Take 10 mg by mouth daily.   Yes Historical Provider, MD  divalproex (DEPAKOTE ER) 500 MG 24 hr tablet Take 1,000 mg by mouth at bedtime.   Yes Historical Provider, MD  HYDROcodone-acetaminophen (NORCO/VICODIN) 5-325 MG per tablet Take 1-2 tablets every 6 hours as needed for severe pain Patient not taking: Reported on 10/13/2014 03/27/14   Carlisle Cater, PA-C  lisinopril (PRINIVIL,ZESTRIL) 40 MG tablet Take 40 mg by mouth daily.   Yes Historical Provider, MD  metFORMIN (GLUCOPHAGE) 500 MG tablet Take 500 mg by mouth daily. 05/26/13  Yes Historical Provider, MD  naproxen (NAPROSYN) 500 MG tablet Take 1 tablet (500 mg total) by mouth 2 (two) times daily. Patient not taking: Reported on 10/13/2014 03/27/14   Carlisle Cater, PA-C  penicillin v potassium (VEETID) 500 MG tablet Take 1 tablet (500 mg total) by mouth 3 (three) times daily. Patient not taking: Reported on 10/13/2014 03/27/14   Carlisle Cater, PA-C  pravastatin (PRAVACHOL) 80 MG tablet Take 80 mg by mouth daily.   Yes Historical Provider, MD  risperidone (RISPERDAL) 4 MG tablet Take 4 mg by mouth 2 (two) times daily.   Yes Historical Provider, MD     Vital Signs: BP 101/50 mmHg  Pulse 66  Temp(Src) 98.6 F (37 C) (Oral)  Resp 18  Ht 5\' 8"  (1.727 m)  Wt 100.699 kg (222 lb)  BMI 33.76 kg/m2  SpO2 92%  Physical Exam  Abdominal: Soft. Bowel sounds are normal.  Site of L pericolonic abscess Sl tender No bleeding Clean and dry Cx Gr- rods Output not recorded 15 cc in bag now Bloody mixed with purulence    Imaging: Ct Abdomen Pelvis W Contrast  10/13/2014   CLINICAL DATA:  Pain. Fever,  abdominal pain, and constipation for 4 days. Lower abdominal pain and distention.  EXAM: CT ABDOMEN AND PELVIS WITH CONTRAST  TECHNIQUE: Multidetector CT imaging of the abdomen and pelvis was performed using the standard protocol following bolus administration of intravenous contrast.  CONTRAST:  183mL OMNIPAQUE IOHEXOL 300 MG/ML  SOLN  COMPARISON:  11/13/2011  FINDINGS: Mild dependent changes in the lung bases.  Small focal area of fatty infiltration adjacent to the falciform ligament in the liver. No other focal lesions identified in the liver. Gallbladder, spleen, pancreas, adrenal glands, abdominal aorta, inferior vena cava, and retroperitoneal lymph nodes are unremarkable. Small cysts on both kidneys. No solid mass or hydronephrosis. Stomach and small bowel are not abnormally distended. There is focal wall thickening and prominent pericolonic inflammatory infiltration around the proximal sigmoid colon which extends up into the left upper quadrant. Inflammatory changes extend to the splenic flexure of the colon. There is focal fluid collection measuring 3.8 x 4.4 cm diameter. Appearance is consistent with acute diverticulitis with developing pericolonic abscess. Suggest follow-up after resolution of acute process to exclude underlying colon neoplasm. Scattered diverticula throughout the colon. No colonic distention.  Pelvis: Appendix is normal. Prostate gland is enlarged. Bladder wall is not thickened. No free or loculated pelvic fluid collections. No pelvic mass or lymphadenopathy.  IMPRESSION: Changes of acute diverticulitis in the sigmoid colon, which extends up into the left upper quadrant. There is associated pericolonic inflammation and abscess measuring  up to 4.4 cm diameter.   Electronically Signed   By: Lucienne Capers M.D.   On: 10/13/2014 19:26   Dg Abd Acute W/chest  10/13/2014   CLINICAL DATA:  64 year old male with a history of abdominal pain, constipation for 4 days. Fever  EXAM: ACUTE ABDOMEN  SERIES (ABDOMEN 2 VIEW & CHEST 1 VIEW)  COMPARISON:  Plain film 11/12/2013, CT 11/13/2011  FINDINGS: Chest:  Cardiomediastinal silhouette within normal limits in size and contour  New airspace opacity at the medial right base, not present on the comparison plain film. This appears to be within the right lower lobe. Blunting of the left costophrenic angle.  No pulmonary vascular congestion or pneumothorax. Thickening of the rib minor fissure.  Abdomen:  Borderline dilated small bowel loops within the left abdomen.  Formed stool within the right and left colon. No gas within the rectum. Minimal gas within the stomach.  No unexpected calcifications. Unremarkable appearance of the liver silhouette, spleen silhouette, and left and right renal silhouette.  No displaced fracture.  Pseudoarticulation of the right L5 transverse process with the sacrum.  IMPRESSION: Chest:  New airspace opacity at the medial right base, concerning for infection given the patient's history.  Abdomen:  Nonspecific bowel gas pattern with borderline dilated small bowel loops in the left abdomen. If there is concern for acute abdominal process, CT would be recommended.  Signed,  Dulcy Fanny. Earleen Newport, DO  [facility]  [facility]   Electronically Signed   By: Corrie Mckusick D.O.   On: 10/13/2014 15:29   Ct Image Guided Drainage By Percutaneous Catheter  10/14/2014   CLINICAL DATA:  Probable diverticular abscess  EXAM: CT GUIDED DRAINAGE OF LEFT RETROPERITONEAL ABSCESS  ANESTHESIA/SEDATION: Intravenous Fentanyl and Versed were administered as conscious sedation during continuous cardiorespiratory monitoring by the radiology RN, with a total moderate sedation time of 20 minutes.  PROCEDURE: The procedure, risks, benefits, and alternatives were explained to the patient. Questions regarding the procedure were encouraged and answered. The patient understands and consents to the procedure.  Select axial scans  through the abdomen were obtained. The collection was localized, an appropriate skin site determined and marked.  The operative field was prepped with Betadinein a sterile fashion, and a sterile drape was applied covering the operative field. A sterile gown and sterile gloves were used for the procedure. Local anesthesia was provided with 1% Lidocaine.  Under CT fluoroscopic guidance, an 18 gauge trocar needle was advanced into the collection. Purulent material could be aspirated. Amplatz guidewire advanced easily, its position confirmed on CT fluoroscopy. Tract was dilated to facilitate placement of a 12 French pigtail drain, formed centrally within the collection. Drain positioning confirmed on CT. Catheter secured externally with 0 Prolene suture and StatLock, and placed to a gravity drain bag. Approximately 28 mL of purulent aspirate were sent for routine Gram stain and culture. The patient tolerated the procedure well.  COMPLICATIONS: None immediate  FINDINGS: CT confirmed the left retroperitoneal inflammatory process associated with the distal descending and proximal sigmoid portions of the colon. Percutaneous drain catheter placement performed under CT fluoroscopic guidance.  IMPRESSION: 1. Technically successful CT guided left retroperitoneal abscess drain catheter placement.   Electronically Signed   By: Lucrezia Europe M.D.   On: 10/14/2014 17:48    Labs:  CBC:  Recent Labs  01/22/14 1246 10/13/14 1354 10/14/14 0520 10/16/14 0802  WBC 6.6 11.3* 10.5 9.3  HGB 14.2 12.1* 11.1* 10.3*  HCT 43.8 36.3* 34.0*  31.5*  PLT 139* 231 224 229    COAGS:  Recent Labs  10/14/14 1117  INR 1.29  APTT 40*    BMP:  Recent Labs  10/13/14 1354 10/14/14 0520  NA 138 135  K 4.2 4.2  CL 102 101  CO2 27 25  GLUCOSE 106* 107*  BUN 18 12  CALCIUM 8.8 8.2*  CREATININE 1.34 0.96  GFRNONAA 56* 88*  GFRAA 64* >90    LIVER FUNCTION TESTS:  Recent Labs  10/13/14 1354  BILITOT 0.8  AST 11  ALT  19  ALKPHOS 52  PROT 7.7  ALBUMIN 3.0*    Assessment and Plan:  L pericolonic abscess drain intact Feeling better Please be recording output and flushes Plan per CCS/[facility] May be scheduled in IR drain clinic if goes home with drain Call [phone] or [phone] to be sure we are aware of wishes   Signed: Chinaza Rooke A 10/16/2014, 9:25 AM   I spent a total of 15 Minutes in face to face in clinical consultation/evaluation, greater than 50% of which was counseling/coordinating care for L pericolonic abscess drain

## 2014-10-17 ENCOUNTER — Inpatient Hospital Stay (HOSPITAL_COMMUNITY): Payer: Medicare Other

## 2014-10-17 DIAGNOSIS — K5720 Diverticulitis of large intestine with perforation and abscess without bleeding: Secondary | ICD-10-CM | POA: Insufficient documentation

## 2014-10-17 LAB — GLUCOSE, CAPILLARY
GLUCOSE-CAPILLARY: 100 mg/dL — AB (ref 70–99)
GLUCOSE-CAPILLARY: 166 mg/dL — AB (ref 70–99)

## 2014-10-17 MED ORDER — METRONIDAZOLE 500 MG PO TABS: 500.0000 mg | ORAL_TABLET | Freq: Three times a day (TID) | ORAL | Status: DC

## 2014-10-17 MED ORDER — BISACODYL 5 MG PO TBEC: 10.0000 mg | DELAYED_RELEASE_TABLET | Freq: Every day | ORAL | Status: DC | PRN

## 2014-10-17 MED ORDER — BISACODYL 10 MG RE SUPP: 10.0000 mg | Freq: Every day | RECTAL | Status: DC | PRN

## 2014-10-17 MED ORDER — CIPROFLOXACIN HCL 500 MG PO TABS
500.0000 mg | ORAL_TABLET | Freq: Two times a day (BID) | ORAL | Status: DC
  Administered 2014-10-17: 500 mg via ORAL
  Filled 2014-10-17: qty 1

## 2014-10-17 MED ORDER — CIPROFLOXACIN HCL 500 MG PO TABS
500.0000 mg | ORAL_TABLET | Freq: Two times a day (BID) | ORAL | Status: DC
Start: 2014-10-17 — End: 2014-10-17

## 2014-10-17 MED ORDER — DOCUSATE SODIUM 100 MG PO CAPS: 100.0000 mg | ORAL_CAPSULE | Freq: Two times a day (BID) | ORAL | Status: DC

## 2014-10-17 MED ORDER — CIPROFLOXACIN HCL 500 MG PO TABS: 500.0000 mg | ORAL_TABLET | Freq: Two times a day (BID) | ORAL | Status: DC

## 2014-10-17 MED ORDER — BISACODYL 10 MG RE SUPP
10.0000 mg | Freq: Every day | RECTAL | Status: DC
  Administered 2014-10-17: 10 mg via RECTAL
  Filled 2014-10-17: qty 1

## 2014-10-17 MED ORDER — METRONIDAZOLE 500 MG PO TABS
500.0000 mg | ORAL_TABLET | Freq: Three times a day (TID) | ORAL | Status: DC
  Administered 2014-10-17: 500 mg via ORAL
  Filled 2014-10-17 (×3): qty 1

## 2014-10-17 NOTE — Discharge Summary (Signed)
Physician Discharge Summary  Bradley [name] ZOX:[id number] DOB: [birth year] DOA: 10/13/2014  PCP: Benito Mccreedy, MD  Admit date: 10/13/2014 Discharge date: 10/17/2014  Time spent: 50 minutes  Recommendations for Outpatient Follow-up:  1. F/u with PCP and with IR for drain removal  Discharge Condition: stable Diet recommendation: heart healthy, diabetic diet  Discharge Diagnoses:  Principal Problem:   Diverticulitis Active Problems:   Hyperlipemia   Bipolar 2 disorder   Tobacco abuse   Abscess   Colonic diverticular abscess   Essential hypertension   Diverticulitis of large intestine with abscess without bleeding   History of present illness:  Bradley [name] is a 64 y.o. male with history of bipolar disorder, tobacco dependence, hypertension, hyperlipidemia. Presents to the hospital complaining of 4 days of abdominal discomfort and fever. Otherwise he denies any other symptoms. Denies any bright red blood per rectum or any skin rashes. The pain is generalized nothing he is aware of makes it worse or better. The problem since onset has been persistent and getting worse as such he presented to the ED for further evaluation recommendations.  Hospital Course:  Principal Problem:  Diverticulitis/ Abscess - CT reveals acute diverticulitis in the sigmoid colon extending to the left upper quadrant with pericolonic inflammation and abscess measuring 4.4 cm - surgery has been consulted- recommended IR drainage - s/p IR drainage- 12 cc of pus- cont clears and antibiotics- culture reveals gr neg rods - cont drain per IR- I have given the number for the patient to call to follow up with IR- - now tolerating a solid diet without mucha abdominal pain - abdomen is quite distended today- he has not had a BM in days- xray of abdomen does not reveal and obstruction or ileus - have ordered a dulcolax suppository - will give 10 more days of antibiotics to complete a total of 14  days  Active Problems:  Hyperlipemia -Continue Pravachol  Hypertension -Continue amlodipine and lisinopril   Procedures: CT IMAGE GUIDED DRAINAGE PERCUT CATH PERITONEAL RETROPERIT  Consultations:  IR  Surgery  Discharge Exam: Filed Weights   10/13/14 2126  Weight: 100.699 kg (222 lb)   Filed Vitals:   10/17/14 0532  BP: 126/67  Pulse: 68  Temp: 98.8 F (37.1 C)  Resp: 18    General: AAO x 3, no distress Cardiovascular: RRR, no murmurs  Respiratory: clear to auscultation bilaterally GI: soft, non-tender, quite distended but bowel sound active- minimally tender in LLQ  Discharge Instructions You were cared for by a hospitalist during your hospital stay. If you have any questions about your discharge medications or the care you received while you were in the hospital after you are discharged, you can call the unit and asked to speak with the hospitalist on call if the hospitalist that took care of you is not available. Once you are discharged, your primary care physician will handle any further medical issues. Please note that NO REFILLS for any discharge medications will be authorized once you are discharged, as it is imperative that you return to your primary care physician (or establish a relationship with a primary care physician if you do not have one) for your aftercare needs so that they can reassess your need for medications and monitor your lab values.      Discharge Instructions    Discharge instructions    Complete by:  As directed   You will need to call the Number provided under follow up section to have drain removed. Take stool softeners daily and  laxatives if you do not have a BM in > 24 hrs Take a heart healthy, low fiber, diabetic diet at home     Increase activity slowly    Complete by:  As directed             Medication List    STOP taking these medications        naproxen 500 MG tablet  Commonly known as:  NAPROSYN      TAKE these  medications        amLODipine 10 MG tablet  Commonly known as:  NORVASC  Take 10 mg by mouth daily.     bisacodyl 5 MG EC tablet  Commonly known as:  bisacodyl  Take 2 tablets (10 mg total) by mouth daily as needed for moderate constipation.     ciprofloxacin 500 MG tablet  Commonly known as:  CIPRO  Take 1 tablet (500 mg total) by mouth 2 (two) times daily.     divalproex 500 MG 24 hr tablet  Commonly known as:  DEPAKOTE ER  Take 1,000 mg by mouth at bedtime.     docusate sodium 100 MG capsule  Commonly known as:  COLACE  Take 1 capsule (100 mg total) by mouth 2 (two) times daily.     HYDROcodone-acetaminophen 5-325 MG per tablet  Commonly known as:  NORCO/VICODIN  Take 1-2 tablets every 6 hours as needed for severe pain     lisinopril 40 MG tablet  Commonly known as:  PRINIVIL,ZESTRIL  Take 40 mg by mouth daily.     metFORMIN 500 MG tablet  Commonly known as:  GLUCOPHAGE  Take 500 mg by mouth daily.     metroNIDAZOLE 500 MG tablet  Commonly known as:  FLAGYL  Take 1 tablet (500 mg total) by mouth every 8 (eight) hours.     penicillin v potassium 500 MG tablet  Commonly known as:  VEETID  Take 1 tablet (500 mg total) by mouth 3 (three) times daily.     pravastatin 80 MG tablet  Commonly known as:  PRAVACHOL  Take 80 mg by mouth daily.     risperidone 4 MG tablet  Commonly known as:  RISPERDAL  Take 4 mg by mouth 2 (two) times daily.       No Known Allergies Follow-up Information    Follow up with [facility], MD. Schedule an appointment as soon as possible for a visit in 2 days.   Specialty:  Interventional Radiology   Contact information:   [address] [place] [address] [phone]       Follow up with OSEI-BONSU,GEORGE, MD In 3 days.   Specialty:  Internal Medicine   Why:  you must see your doctor in 3-4 days   Contact information:   [address] [place] [address] [phone]        The results of  significant diagnostics from this hospitalization (including imaging, microbiology, ancillary and laboratory) are listed below for reference.    Significant Diagnostic Studies: Ct Abdomen Pelvis W Contrast  10/13/2014   CLINICAL DATA:  Pain. Fever, abdominal pain, and constipation for 4 days. Lower abdominal pain and distention.  EXAM: CT ABDOMEN AND PELVIS WITH CONTRAST  TECHNIQUE: Multidetector CT imaging of the abdomen and pelvis was performed using the standard protocol following bolus administration of intravenous contrast.  CONTRAST:  166mL OMNIPAQUE IOHEXOL 300 MG/ML  SOLN  COMPARISON:  11/13/2011  FINDINGS: Mild dependent changes in  the lung bases.  Small focal area of fatty infiltration adjacent to the falciform ligament in the liver. No other focal lesions identified in the liver. Gallbladder, spleen, pancreas, adrenal glands, abdominal aorta, inferior vena cava, and retroperitoneal lymph nodes are unremarkable. Small cysts on both kidneys. No solid mass or hydronephrosis. Stomach and small bowel are not abnormally distended. There is focal wall thickening and prominent pericolonic inflammatory infiltration around the proximal sigmoid colon which extends up into the left upper quadrant. Inflammatory changes extend to the splenic flexure of the colon. There is focal fluid collection measuring 3.8 x 4.4 cm diameter. Appearance is consistent with acute diverticulitis with developing pericolonic abscess. Suggest follow-up after resolution of acute process to exclude underlying colon neoplasm. Scattered diverticula throughout the colon. No colonic distention.  Pelvis: Appendix is normal. Prostate gland is enlarged. Bladder wall is not thickened. No free or loculated pelvic fluid collections. No pelvic mass or lymphadenopathy.  IMPRESSION: Changes of acute diverticulitis in the sigmoid colon, which extends up into the left upper quadrant. There is associated pericolonic inflammation and abscess measuring up  to 4.4 cm diameter.   Electronically Signed   By: Lucienne Capers M.D.   On: 10/13/2014 19:26   Dg Abd Acute W/chest  10/13/2014   CLINICAL DATA:  64 year old male with a history of abdominal pain, constipation for 4 days. Fever  EXAM: ACUTE ABDOMEN SERIES (ABDOMEN 2 VIEW & CHEST 1 VIEW)  COMPARISON:  Plain film 11/12/2013, CT 11/13/2011  FINDINGS: Chest:  Cardiomediastinal silhouette within normal limits in size and contour  New airspace opacity at the medial right base, not present on the comparison plain film. This appears to be within the right lower lobe. Blunting of the left costophrenic angle.  No pulmonary vascular congestion or pneumothorax. Thickening of the rib minor fissure.  Abdomen:  Borderline dilated small bowel loops within the left abdomen.  Formed stool within the right and left colon. No gas within the rectum. Minimal gas within the stomach.  No unexpected calcifications. Unremarkable appearance of the liver silhouette, spleen silhouette, and left and right renal silhouette.  No displaced fracture.  Pseudoarticulation of the right L5 transverse process with the sacrum.  IMPRESSION: Chest:  New airspace opacity at the medial right base, concerning for infection given the patient's history.  Abdomen:  Nonspecific bowel gas pattern with borderline dilated small bowel loops in the left abdomen. If there is concern for acute abdominal process, CT would be recommended.  Signed,  Dulcy Fanny. Earleen Newport, DO  [facility]  [facility]   Electronically Signed   By: Corrie Mckusick D.O.   On: 10/13/2014 15:29   Dg Abd Portable 1v  10/17/2014   CLINICAL DATA:  Abdominal distention and left epigastric pain.  EXAM: PORTABLE ABDOMEN - 1 VIEW  COMPARISON:  CT of the abdomen and pelvis on 10/14/2014  FINDINGS: Bowel gas pattern is nonobstructive. There is residual contrast in the ascending colonic loops. A pigtail catheter tip overlies the left central abdomen.  Visualized osseous structures have a normal appearance. No evidence for free air on this supine view.  IMPRESSION: Nonobstructive bowel gas pattern.   Electronically Signed   By: Nolon Nations M.D.   On: 10/17/2014 09:38   Ct Image Guided Drainage By Percutaneous Catheter  10/14/2014   CLINICAL DATA:  Probable diverticular abscess  EXAM: CT GUIDED DRAINAGE OF LEFT RETROPERITONEAL ABSCESS  ANESTHESIA/SEDATION: Intravenous Fentanyl and Versed were administered as conscious sedation during continuous cardiorespiratory monitoring by  the radiology RN, with a total moderate sedation time of 20 minutes.  PROCEDURE: The procedure, risks, benefits, and alternatives were explained to the patient. Questions regarding the procedure were encouraged and answered. The patient understands and consents to the procedure.  Select axial scans through the abdomen were obtained. The collection was localized, an appropriate skin site determined and marked.  The operative field was prepped with Betadinein a sterile fashion, and a sterile drape was applied covering the operative field. A sterile gown and sterile gloves were used for the procedure. Local anesthesia was provided with 1% Lidocaine.  Under CT fluoroscopic guidance, an 18 gauge trocar needle was advanced into the collection. Purulent material could be aspirated. Amplatz guidewire advanced easily, its position confirmed on CT fluoroscopy. Tract was dilated to facilitate placement of a 12 French pigtail drain, formed centrally within the collection. Drain positioning confirmed on CT. Catheter secured externally with 0 Prolene suture and StatLock, and placed to a gravity drain bag. Approximately 28 mL of purulent aspirate were sent for routine Gram stain and culture. The patient tolerated the procedure well.  COMPLICATIONS: None immediate  FINDINGS: CT confirmed the left retroperitoneal inflammatory process associated with the distal descending and proximal sigmoid portions  of the colon. Percutaneous drain catheter placement performed under CT fluoroscopic guidance.  IMPRESSION: 1. Technically successful CT guided left retroperitoneal abscess drain catheter placement.   Electronically Signed   By: Lucrezia Europe M.D.   On: 10/14/2014 17:48    Microbiology: Recent Results (from the past 240 hour(s))  Culture, routine-abscess     Status: None (Preliminary result)   Collection Time: 10/14/14  5:19 PM  Result Value Ref Range Status   Specimen Description ABSCESS ABDOMEN LEFT  Final   Special Requests Normal  Final   Gram Stain   Final    ABUNDANT WBC PRESENT,BOTH PMN AND MONONUCLEAR NO SQUAMOUS EPITHELIAL CELLS SEEN MODERATE GRAM NEGATIVE RODS MODERATE GRAM POSITIVE RODS FEW GRAM POSITIVE COCCI    Culture   Final    MODERATE GRAM NEGATIVE RODS Performed at [facility]    Report Status PENDING  Incomplete     Labs: Basic Metabolic Panel:  Recent Labs Lab 10/13/14 1354 10/14/14 0520  NA 138 135  K 4.2 4.2  CL 102 101  CO2 27 25  GLUCOSE 106* 107*  BUN 18 12  CREATININE 1.34 0.96  CALCIUM 8.8 8.2*   Liver Function Tests:  Recent Labs Lab 10/13/14 1354  AST 11  ALT 19  ALKPHOS 52  BILITOT 0.8  PROT 7.7  ALBUMIN 3.0*    Recent Labs Lab 10/13/14 1354  LIPASE 19   No results for input(s): AMMONIA in the last 168 hours. CBC:  Recent Labs Lab 10/13/14 1354 10/14/14 0520 10/16/14 0802  WBC 11.3* 10.5 9.3  NEUTROABS 7.1  --   --   HGB 12.1* 11.1* 10.3*  HCT 36.3* 34.0* 31.5*  MCV 86.4 86.7 85.6  PLT 231 224 229   Cardiac Enzymes: No results for input(s): CKTOTAL, CKMB, CKMBINDEX, TROPONINI in the last 168 hours. BNP: BNP (last 3 results) No results for input(s): BNP in the last 8760 hours.  ProBNP (last 3 results) No results for input(s): PROBNP in the last 8760 hours.  CBG:  Recent Labs Lab 10/16/14 0615 10/16/14 1227 10/16/14 1758 10/16/14 2132 10/17/14 0630  GLUCAP 106* 114* 143* 127* 100*        SignedDebbe Odea, MD [facility] 10/17/2014, 10:40 AM

## 2014-10-17 NOTE — Progress Notes (Signed)
  Subjective: Pt doing well with PO Min pain  Objective: Vital signs in last 24 hours: Temp:  [98.8 F (37.1 C)-99.3 F (37.4 C)] 98.8 F (37.1 C) (02/28 0532) Pulse Rate:  [66-76] 68 (02/28 0532) Resp:  [18] 18 (02/28 0532) BP: (115-141)/(50-67) 126/67 mmHg (02/28 0532) SpO2:  [90 %-94 %] 92 % (02/28 0532) Last BM Date: 10/09/14  Intake/Output from previous day: 02/27 0701 - 02/28 0700 In: 1496.8 [P.O.:1040; I.V.:141.8; IV Piggyback:300] Out: 570 [Urine:550; Drains:20] Intake/Output this shift: Total I/O In: 20 [Other:20] Out: 20 [Drains:20]  General appearance: alert and cooperative GI: soft, non-tender; bowel sounds normal; no masses,  no organomegaly  Lab Results:   Recent Labs  10/16/14 0802  WBC 9.3  HGB 10.3*  HCT 31.5*  PLT 229   BMET No results for input(s): NA, K, CL, CO2, GLUCOSE, BUN, CREATININE, CALCIUM in the last 72 hours. PT/INR  Recent Labs  10/14/14 1117  LABPROT 16.2*  INR 1.29    Anti-infectives: Anti-infectives    Start     Dose/Rate Route Frequency Ordered Stop   10/14/14 0900  ciprofloxacin (CIPRO) IVPB 400 mg     400 mg 200 mL/hr over 60 Minutes Intravenous Every 12 hours 10/13/14 2144     10/13/14 2200  metroNIDAZOLE (FLAGYL) IVPB 500 mg     500 mg 100 mL/hr over 60 Minutes Intravenous Every 8 hours 10/13/14 2123     10/13/14 2000  ciprofloxacin (CIPRO) IVPB 400 mg  Status:  Discontinued     400 mg 200 mL/hr over 60 Minutes Intravenous  Once 10/13/14 1957 10/13/14 2123   10/13/14 2000  metroNIDAZOLE (FLAGYL) IVPB 500 mg     500 mg 100 mL/hr over 60 Minutes Intravenous  Once 10/13/14 1957 10/13/14 2109      Assessment/Plan: 64 y/o M Diverticulitis with abscess s/p perc drain OK for DC from Surgery standpoint.   Will need f/u in [place] clinic Con't Abx  LOS: 4 days    Rosario Jacks., [facility] 10/17/2014

## 2014-10-17 NOTE — Progress Notes (Signed)
Referring Physician(s): CCS/TRH  Subjective:  LLQ abscess drain placed 2/25 Better daily  Allergies: Review of patient's allergies indicates no known allergies.  Medications: Prior to Admission medications   Medication Sig Start Date End Date Taking? Authorizing Provider  amLODipine (NORVASC) 10 MG tablet Take 10 mg by mouth daily.   Yes Historical Provider, MD  divalproex (DEPAKOTE ER) 500 MG 24 hr tablet Take 1,000 mg by mouth at bedtime.   Yes Historical Provider, MD  HYDROcodone-acetaminophen (NORCO/VICODIN) 5-325 MG per tablet Take 1-2 tablets every 6 hours as needed for severe pain Patient not taking: Reported on 10/13/2014 03/27/14   Carlisle Cater, PA-C  lisinopril (PRINIVIL,ZESTRIL) 40 MG tablet Take 40 mg by mouth daily.   Yes Historical Provider, MD  metFORMIN (GLUCOPHAGE) 500 MG tablet Take 500 mg by mouth daily. 05/26/13  Yes Historical Provider, MD  naproxen (NAPROSYN) 500 MG tablet Take 1 tablet (500 mg total) by mouth 2 (two) times daily. Patient not taking: Reported on 10/13/2014 03/27/14   Carlisle Cater, PA-C  penicillin v potassium (VEETID) 500 MG tablet Take 1 tablet (500 mg total) by mouth 3 (three) times daily. Patient not taking: Reported on 10/13/2014 03/27/14   Carlisle Cater, PA-C  pravastatin (PRAVACHOL) 80 MG tablet Take 80 mg by mouth daily.   Yes Historical Provider, MD  risperidone (RISPERDAL) 4 MG tablet Take 4 mg by mouth 2 (two) times daily.   Yes Historical Provider, MD     Vital Signs: BP 126/67 mmHg  Pulse 68  Temp(Src) 98.8 F (37.1 C) (Oral)  Resp 18  Ht 5\' 8"  (1.727 m)  Wt 100.699 kg (222 lb)  BMI 33.76 kg/m2  SpO2 92%  Physical Exam  Abdominal:  LLQ drain site clean and dry NT No bleeding Drain flushes and aspirates easily Out bloody/purulent 200 cc yesterday 20 cc in bag Cx Gr - rods Wbc wnl afeb    Imaging: Ct Abdomen Pelvis W Contrast  10/13/2014   CLINICAL DATA:  Pain. Fever, abdominal pain, and constipation for 4 days.  Lower abdominal pain and distention.  EXAM: CT ABDOMEN AND PELVIS WITH CONTRAST  TECHNIQUE: Multidetector CT imaging of the abdomen and pelvis was performed using the standard protocol following bolus administration of intravenous contrast.  CONTRAST:  111mL OMNIPAQUE IOHEXOL 300 MG/ML  SOLN  COMPARISON:  11/13/2011  FINDINGS: Mild dependent changes in the lung bases.  Small focal area of fatty infiltration adjacent to the falciform ligament in the liver. No other focal lesions identified in the liver. Gallbladder, spleen, pancreas, adrenal glands, abdominal aorta, inferior vena cava, and retroperitoneal lymph nodes are unremarkable. Small cysts on both kidneys. No solid mass or hydronephrosis. Stomach and small bowel are not abnormally distended. There is focal wall thickening and prominent pericolonic inflammatory infiltration around the proximal sigmoid colon which extends up into the left upper quadrant. Inflammatory changes extend to the splenic flexure of the colon. There is focal fluid collection measuring 3.8 x 4.4 cm diameter. Appearance is consistent with acute diverticulitis with developing pericolonic abscess. Suggest follow-up after resolution of acute process to exclude underlying colon neoplasm. Scattered diverticula throughout the colon. No colonic distention.  Pelvis: Appendix is normal. Prostate gland is enlarged. Bladder wall is not thickened. No free or loculated pelvic fluid collections. No pelvic mass or lymphadenopathy.  IMPRESSION: Changes of acute diverticulitis in the sigmoid colon, which extends up into the left upper quadrant. There is associated pericolonic inflammation and abscess measuring up to 4.4 cm diameter.  Electronically Signed   By: Lucienne Capers M.D.   On: 10/13/2014 19:26   Dg Abd Acute W/chest  10/13/2014   CLINICAL DATA:  64 year old male with a history of abdominal pain, constipation for 4 days. Fever  EXAM: ACUTE ABDOMEN SERIES (ABDOMEN 2 VIEW & CHEST 1 VIEW)   COMPARISON:  Plain film 11/12/2013, CT 11/13/2011  FINDINGS: Chest:  Cardiomediastinal silhouette within normal limits in size and contour  New airspace opacity at the medial right base, not present on the comparison plain film. This appears to be within the right lower lobe. Blunting of the left costophrenic angle.  No pulmonary vascular congestion or pneumothorax. Thickening of the rib minor fissure.  Abdomen:  Borderline dilated small bowel loops within the left abdomen.  Formed stool within the right and left colon. No gas within the rectum. Minimal gas within the stomach.  No unexpected calcifications. Unremarkable appearance of the liver silhouette, spleen silhouette, and left and right renal silhouette.  No displaced fracture.  Pseudoarticulation of the right L5 transverse process with the sacrum.  IMPRESSION: Chest:  New airspace opacity at the medial right base, concerning for infection given the patient's history.  Abdomen:  Nonspecific bowel gas pattern with borderline dilated small bowel loops in the left abdomen. If there is concern for acute abdominal process, CT would be recommended.  Signed,  Dulcy Fanny. Earleen Newport, DO  [facility]  [facility]   Electronically Signed   By: Corrie Mckusick D.O.   On: 10/13/2014 15:29   Ct Image Guided Drainage By Percutaneous Catheter  10/14/2014   CLINICAL DATA:  Probable diverticular abscess  EXAM: CT GUIDED DRAINAGE OF LEFT RETROPERITONEAL ABSCESS  ANESTHESIA/SEDATION: Intravenous Fentanyl and Versed were administered as conscious sedation during continuous cardiorespiratory monitoring by the radiology RN, with a total moderate sedation time of 20 minutes.  PROCEDURE: The procedure, risks, benefits, and alternatives were explained to the patient. Questions regarding the procedure were encouraged and answered. The patient understands and consents to the procedure.  Select axial scans through the abdomen were obtained. The  collection was localized, an appropriate skin site determined and marked.  The operative field was prepped with Betadinein a sterile fashion, and a sterile drape was applied covering the operative field. A sterile gown and sterile gloves were used for the procedure. Local anesthesia was provided with 1% Lidocaine.  Under CT fluoroscopic guidance, an 18 gauge trocar needle was advanced into the collection. Purulent material could be aspirated. Amplatz guidewire advanced easily, its position confirmed on CT fluoroscopy. Tract was dilated to facilitate placement of a 12 French pigtail drain, formed centrally within the collection. Drain positioning confirmed on CT. Catheter secured externally with 0 Prolene suture and StatLock, and placed to a gravity drain bag. Approximately 28 mL of purulent aspirate were sent for routine Gram stain and culture. The patient tolerated the procedure well.  COMPLICATIONS: None immediate  FINDINGS: CT confirmed the left retroperitoneal inflammatory process associated with the distal descending and proximal sigmoid portions of the colon. Percutaneous drain catheter placement performed under CT fluoroscopic guidance.  IMPRESSION: 1. Technically successful CT guided left retroperitoneal abscess drain catheter placement.   Electronically Signed   By: Lucrezia Europe M.D.   On: 10/14/2014 17:48    Labs:  CBC:  Recent Labs  01/22/14 1246 10/13/14 1354 10/14/14 0520 10/16/14 0802  WBC 6.6 11.3* 10.5 9.3  HGB 14.2 12.1* 11.1* 10.3*  HCT 43.8 36.3* 34.0* 31.5*  PLT 139* 231 224 229  COAGS:  Recent Labs  10/14/14 1117  INR 1.29  APTT 40*    BMP:  Recent Labs  10/13/14 1354 10/14/14 0520  NA 138 135  K 4.2 4.2  CL 102 101  CO2 27 25  GLUCOSE 106* 107*  BUN 18 12  CALCIUM 8.8 8.2*  CREATININE 1.34 0.96  GFRNONAA 56* 88*  GFRAA 64* >90    LIVER FUNCTION TESTS:  Recent Labs  10/13/14 1354  BILITOT 0.8  AST 11  ALT 19  ALKPHOS 52  PROT 7.7  ALBUMIN  3.0*    Assessment and Plan:  divertic abscess drain placed 2/25 Plan per CCS Call IR if need appt for drain clinic Will need CT and drain inj before pull  Signed: Maisen Klingler A 10/17/2014, 8:20 AM   I spent a total of 15 Minutes in face to face in clinical consultation/evaluation, greater than 50% of which was counseling/coordinating care for LLQ abscess drain

## 2014-10-18 ENCOUNTER — Telehealth (HOSPITAL_COMMUNITY): Payer: Self-pay | Admitting: Interventional Radiology

## 2014-10-18 ENCOUNTER — Other Ambulatory Visit (HOSPITAL_COMMUNITY): Payer: Self-pay | Admitting: Interventional Radiology

## 2014-10-18 DIAGNOSIS — L0291 Cutaneous abscess, unspecified: Secondary | ICD-10-CM

## 2014-10-18 LAB — CULTURE, ROUTINE-ABSCESS: SPECIAL REQUESTS: NORMAL

## 2014-10-18 NOTE — Telephone Encounter (Signed)
Called pt, left VM that his appointment scheduled for 10/19/14 has been canceled. He needs to wait at least 1 week. JM

## 2014-10-19 ENCOUNTER — Ambulatory Visit (HOSPITAL_COMMUNITY): Payer: Medicare Other

## 2014-10-20 ENCOUNTER — Other Ambulatory Visit (HOSPITAL_COMMUNITY): Payer: Self-pay | Admitting: Interventional Radiology

## 2014-10-20 DIAGNOSIS — L0291 Cutaneous abscess, unspecified: Secondary | ICD-10-CM

## 2014-10-26 ENCOUNTER — Ambulatory Visit (HOSPITAL_COMMUNITY)
Admission: RE | Admit: 2014-10-26 | Discharge: 2014-10-26 | Disposition: A | Discharge status: Home / Self Care | Payer: Medicare Other | Source: Ambulatory Visit | Attending: Interventional Radiology | Admitting: Interventional Radiology

## 2014-10-26 DIAGNOSIS — Z4803 Encounter for change or removal of drains: Secondary | ICD-10-CM | POA: Insufficient documentation

## 2014-10-26 DIAGNOSIS — K5780 Diverticulitis of intestine, part unspecified, with perforation and abscess without bleeding: Secondary | ICD-10-CM | POA: Diagnosis not present

## 2014-10-26 DIAGNOSIS — L0291 Cutaneous abscess, unspecified: Secondary | ICD-10-CM

## 2014-10-26 MED ORDER — IOHEXOL 300 MG/ML  SOLN
80.0000 mL | Freq: Once | INTRAMUSCULAR | Status: AC | PRN
  Administered 2014-10-26: 80 mL via INTRAVENOUS

## 2014-10-26 NOTE — Discharge Instructions (Signed)
Follow up with Dr Unice Bailey May shower with dressing on until site is closed Apply anti-biotic ointment to site daily

## 2015-02-25 ENCOUNTER — Encounter (HOSPITAL_COMMUNITY): Payer: Self-pay | Admitting: Emergency Medicine

## 2015-02-25 ENCOUNTER — Emergency Department (HOSPITAL_COMMUNITY): Payer: Medicare Other

## 2015-02-25 ENCOUNTER — Emergency Department (HOSPITAL_COMMUNITY)
Admission: EM | Admit: 2015-02-25 | Discharge: 2015-02-26 | Disposition: A | Discharge status: Home / Self Care | Payer: Medicare Other | Attending: Emergency Medicine | Admitting: Emergency Medicine

## 2015-02-25 DIAGNOSIS — J9801 Acute bronchospasm: Secondary | ICD-10-CM | POA: Insufficient documentation

## 2015-02-25 DIAGNOSIS — Z79899 Other long term (current) drug therapy: Secondary | ICD-10-CM | POA: Diagnosis not present

## 2015-02-25 DIAGNOSIS — E119 Type 2 diabetes mellitus without complications: Secondary | ICD-10-CM | POA: Insufficient documentation

## 2015-02-25 DIAGNOSIS — E785 Hyperlipidemia, unspecified: Secondary | ICD-10-CM | POA: Diagnosis not present

## 2015-02-25 DIAGNOSIS — F3181 Bipolar II disorder: Secondary | ICD-10-CM | POA: Insufficient documentation

## 2015-02-25 DIAGNOSIS — Z862 Personal history of diseases of the blood and blood-forming organs and certain disorders involving the immune mechanism: Secondary | ICD-10-CM | POA: Diagnosis not present

## 2015-02-25 DIAGNOSIS — R0600 Dyspnea, unspecified: Secondary | ICD-10-CM

## 2015-02-25 DIAGNOSIS — R0602 Shortness of breath: Secondary | ICD-10-CM | POA: Diagnosis present

## 2015-02-25 DIAGNOSIS — Z720 Tobacco use: Secondary | ICD-10-CM | POA: Insufficient documentation

## 2015-02-25 DIAGNOSIS — I10 Essential (primary) hypertension: Secondary | ICD-10-CM | POA: Insufficient documentation

## 2015-02-25 LAB — CBC WITH DIFFERENTIAL/PLATELET
Basophils Absolute: 0 10*3/uL (ref 0.0–0.1)
Basophils Relative: 0 % (ref 0–1)
EOS ABS: 0.2 10*3/uL (ref 0.0–0.7)
Eosinophils Relative: 3 % (ref 0–5)
HEMATOCRIT: 38.9 % — AB (ref 39.0–52.0)
Hemoglobin: 12.7 g/dL — ABNORMAL LOW (ref 13.0–17.0)
LYMPHS ABS: 2.1 10*3/uL (ref 0.7–4.0)
Lymphocytes Relative: 32 % (ref 12–46)
MCH: 29 pg (ref 26.0–34.0)
MCHC: 32.6 g/dL (ref 30.0–36.0)
MCV: 88.8 fL (ref 78.0–100.0)
Monocytes Absolute: 1 10*3/uL (ref 0.1–1.0)
Monocytes Relative: 15 % — ABNORMAL HIGH (ref 3–12)
NEUTROS PCT: 50 % (ref 43–77)
Neutro Abs: 3.3 10*3/uL (ref 1.7–7.7)
Platelets: 139 10*3/uL — ABNORMAL LOW (ref 150–400)
RBC: 4.38 MIL/uL (ref 4.22–5.81)
RDW: 14.4 % (ref 11.5–15.5)
WBC: 6.7 10*3/uL (ref 4.0–10.5)

## 2015-02-25 LAB — BASIC METABOLIC PANEL
ANION GAP: 8 (ref 5–15)
BUN: 6 mg/dL (ref 6–20)
CO2: 27 mmol/L (ref 22–32)
CREATININE: 1.02 mg/dL (ref 0.61–1.24)
Calcium: 9 mg/dL (ref 8.9–10.3)
Chloride: 105 mmol/L (ref 101–111)
GFR calc Af Amer: >60 mL/min (ref >60)
GFR calc non Af Amer: >60 mL/min (ref >60)
Glucose, Bld: 109 mg/dL — ABNORMAL HIGH (ref 65–99)
Potassium: 4.5 mmol/L (ref 3.5–5.1)
SODIUM: 140 mmol/L (ref 135–145)

## 2015-02-25 MED ORDER — IPRATROPIUM-ALBUTEROL 0.5-2.5 (3) MG/3ML IN SOLN
3.0000 mL | Freq: Once | RESPIRATORY_TRACT | Status: AC
  Administered 2015-02-25: 3 mL via RESPIRATORY_TRACT
  Filled 2015-02-25: qty 3

## 2015-02-25 MED ORDER — ALBUTEROL (5 MG/ML) CONTINUOUS INHALATION SOLN
5.0000 mg/h | INHALATION_SOLUTION | Freq: Once | RESPIRATORY_TRACT | Status: AC
Start: 2015-02-25 — End: 2015-02-26
  Administered 2015-02-26: 5 mg/h via RESPIRATORY_TRACT
  Filled 2015-02-25: qty 20

## 2015-02-25 MED ORDER — PREDNISONE 20 MG PO TABS
50.0000 mg | ORAL_TABLET | Freq: Once | ORAL | Status: AC
  Administered 2015-02-25: 50 mg via ORAL
  Filled 2015-02-25: qty 3

## 2015-02-25 NOTE — ED Notes (Signed)
EDP at bedside  

## 2015-02-25 NOTE — ED Notes (Signed)
Called respiratory to admin continuous neb

## 2015-02-25 NOTE — ED Notes (Signed)
Denies recent weight gain, leg swelling, or difficulty laying flat.

## 2015-02-25 NOTE — ED Notes (Signed)
Patient here with complaint of cough and "feeling like I can't get a deep breath". Symptoms began yesterday. Denies history of similar. States no history of asthma or COPD.

## 2015-02-25 NOTE — ED Provider Notes (Signed)
CSN: [id number]     Arrival date & time 02/25/15  1914 History   First MD Initiated Contact with Patient 02/25/15 2157     Chief Complaint  Patient presents with  . Shortness of Breath  . Cough   HPI  Patient is a patient is a 64 year old African-American male with a history of smoking 1 pack a day for multiple years hypertension hyperlipidemia bipolar 2 presenting for increasing shortness of breath mild cough and sputum production over the past 24 hours. Patient denies any chest pain, fevers, chills, nausea, vomiting. No alleviating factors for cough and takes no medications at home for breathing. Denies any diagnosis of COPD in the past. Denies any lower extremity swelling or previous history of blood clots in his legs or his lungs.  SOB increased with walking prolong distance.   Past Medical History  Diagnosis Date  . Hypertension   . Hyperlipemia   . Bipolar 2 disorder   . Hypogonadism male   . Thrombocytopenia, unspecified 06/17/2013  . Diabetes mellitus without complication    Past Surgical History  Procedure Laterality Date  . Colonoscopy     Family History  Problem Relation Age of Onset  . Cancer Brother     abdominal CA  . Colon cancer Neg Hx    History  Substance Use Topics  . Smoking status: Current Every Day Smoker -- 1.00 packs/day for 0 years    Types: Cigarettes  . Smokeless tobacco: Never Used  . Alcohol Use: No    Review of Systems  Constitutional: Negative for fever and chills.  HENT: Negative for congestion and sore throat.   Eyes: Negative for pain.  Respiratory: Positive for cough, shortness of breath and wheezing. Negative for chest tightness and stridor.   Cardiovascular: Negative for chest pain, palpitations and leg swelling.  Gastrointestinal: Negative for nausea, vomiting, abdominal pain and diarrhea.  Endocrine: Negative.   Genitourinary: Negative for flank pain.  Musculoskeletal: Negative for back pain and neck pain.  Skin: Negative for rash.   Allergic/Immunologic: Negative.   Neurological: Negative for dizziness, syncope and light-headedness.  Psychiatric/Behavioral: Negative for confusion.   Allergies  Review of patient's allergies indicates no known allergies.  Home Medications   Prior to Admission medications   Medication Sig Start Date End Date Taking? Authorizing Provider  amLODipine (NORVASC) 10 MG tablet Take 10 mg by mouth daily.   Yes Historical Provider, MD  divalproex (DEPAKOTE ER) 500 MG 24 hr tablet Take 1,000 mg by mouth at bedtime.   Yes Historical Provider, MD  lisinopril (PRINIVIL,ZESTRIL) 40 MG tablet Take 40 mg by mouth daily.   Yes Historical Provider, MD  metFORMIN (GLUCOPHAGE) 500 MG tablet Take 500 mg by mouth every evening.  05/26/13  Yes Historical Provider, MD  pravastatin (PRAVACHOL) 80 MG tablet Take 80 mg by mouth every evening.    Yes Historical Provider, MD  risperidone (RISPERDAL) 4 MG tablet Take 4 mg by mouth 2 (two) times daily.   Yes Historical Provider, MD  doxycycline (VIBRAMYCIN) 100 MG capsule Take 1 capsule (100 mg total) by mouth 2 (two) times daily. 02/26/15 03/04/15  Geronimo Boot, MD  predniSONE (DELTASONE) 50 MG tablet Take 1 tablet (50 mg total) by mouth daily. 02/26/15   Geronimo Boot, MD   BP 142/67 mmHg  Pulse 73  Temp(Src) 98.7 F (37.1 C) (Oral)  Resp 22  Wt 225 lb (102.059 kg)  SpO2 90% Physical Exam  Constitutional: He is oriented to person, place, and time. He  appears well-developed and well-nourished.  HENT:  Head: Normocephalic and atraumatic.  Eyes: Conjunctivae and EOM are normal. Pupils are equal, round, and reactive to light.  Neck: Normal range of motion. Neck supple.  Cardiovascular: Normal rate, regular rhythm, normal heart sounds and intact distal pulses.   No lower extremity swelling bilaterally  Pulmonary/Chest: Accessory muscle usage present. Tachypnea noted. No respiratory distress. He has no decreased breath sounds. He has wheezes in the right middle  field, the right lower field, the left middle field and the left lower field. He has no rhonchi. He has no rales.  Abdominal: Soft. Bowel sounds are normal. There is no tenderness.  Musculoskeletal: Normal range of motion.  Neurological: He is alert and oriented to person, place, and time. He has normal reflexes. No cranial nerve deficit.  Skin: Skin is warm and dry.   ED Course  Procedures (including critical care time) Labs Review Labs Reviewed  CBC WITH DIFFERENTIAL/PLATELET - Abnormal; Notable for the following:    Hemoglobin 12.7 (*)    HCT 38.9 (*)    Platelets 139 (*)    Monocytes Relative 15 (*)    All other components within normal limits  BASIC METABOLIC PANEL - Abnormal; Notable for the following:    Glucose, Bld 109 (*)    All other components within normal limits  BRAIN NATRIURETIC PEPTIDE    Imaging Review Dg Chest 2 View  02/25/2015   CLINICAL DATA:  Shortness of breath  EXAM: CHEST  2 VIEW  COMPARISON:  10/13/2014  FINDINGS: No cardiomegaly. Stable mild aortic tortuosity. Chronic mild interstitial coarsening, likely related to patient's history of smoking. There is no edema, consolidation, effusion, or pneumothorax. No acute osseous findings.  IMPRESSION: No active cardiopulmonary disease.   Electronically Signed   By: Monte Fantasia M.D.   On: 02/25/2015 23:23     EKG Interpretation None      MDM   Final diagnoses:  Bronchospasm  Dyspnea   Arrival patient tachypnea with bilateral wheezes and no rhonchi. Afebrile. Given DuoNeb in our long albuterol treatment and steroids in the emergency department with symptomatically relief. Initially satting 88 on room air now 94% on room air. EKG performed showing no acute ischemic changes or arrhythmia. Chest x-ray with no pneumonia or pneumothorax. Patient prescribed prednisone burst for home with doxycycline for new sputum production. Likely COPD exacerbation due to long history of smoking and doubt cardiac etiology.   Advised to f/u with PCP in 3 days for further evaluation and likely need for pulmonology referral.   If performed, labs, EKGs, and imaging were reviewed/interpreted by myself and my attending and incorporated into medical decision making.  Discussed pertinent finding with patient or caregiver prior to discharge with no further questions.  Immediate return precautions given and pt or caregiver reports understanding.  Pt care supervised by my attending Dr. Sandy Salaam, MD PGY-2  Emergency Medicine     Geronimo Boot, MD 02/26/15 0111  Jola Schmidt, MD 02/26/15 [phone]

## 2015-02-26 LAB — BRAIN NATRIURETIC PEPTIDE: B NATRIURETIC PEPTIDE 5: 7.2 pg/mL (ref 0.0–100.0)

## 2015-02-26 MED ORDER — PREDNISONE 50 MG PO TABS: 50.0000 mg | ORAL_TABLET | Freq: Every day | ORAL | Status: DC

## 2015-02-26 MED ORDER — DOXYCYCLINE HYCLATE 100 MG PO CAPS: 100.0000 mg | ORAL_CAPSULE | Freq: Two times a day (BID) | ORAL | Status: AC

## 2015-04-07 DIAGNOSIS — E785 Hyperlipidemia, unspecified: Secondary | ICD-10-CM | POA: Diagnosis not present

## 2015-04-07 DIAGNOSIS — J9801 Acute bronchospasm: Secondary | ICD-10-CM | POA: Diagnosis not present

## 2015-04-07 DIAGNOSIS — I119 Hypertensive heart disease without heart failure: Secondary | ICD-10-CM | POA: Diagnosis not present

## 2015-04-07 DIAGNOSIS — E1151 Type 2 diabetes mellitus with diabetic peripheral angiopathy without gangrene: Secondary | ICD-10-CM | POA: Diagnosis not present

## 2015-04-07 DIAGNOSIS — R609 Edema, unspecified: Secondary | ICD-10-CM | POA: Diagnosis not present

## 2015-04-07 DIAGNOSIS — Z720 Tobacco use: Secondary | ICD-10-CM | POA: Diagnosis not present

## 2015-04-07 DIAGNOSIS — I10 Essential (primary) hypertension: Secondary | ICD-10-CM | POA: Diagnosis not present

## 2015-04-08 DIAGNOSIS — I10 Essential (primary) hypertension: Secondary | ICD-10-CM | POA: Diagnosis not present

## 2015-04-13 ENCOUNTER — Encounter: Payer: Self-pay | Admitting: Internal Medicine

## 2015-04-28 DIAGNOSIS — Z720 Tobacco use: Secondary | ICD-10-CM | POA: Diagnosis not present

## 2015-04-28 DIAGNOSIS — E785 Hyperlipidemia, unspecified: Secondary | ICD-10-CM | POA: Diagnosis not present

## 2015-04-28 DIAGNOSIS — E1151 Type 2 diabetes mellitus with diabetic peripheral angiopathy without gangrene: Secondary | ICD-10-CM | POA: Diagnosis not present

## 2015-04-28 DIAGNOSIS — I10 Essential (primary) hypertension: Secondary | ICD-10-CM | POA: Diagnosis not present

## 2015-04-28 DIAGNOSIS — J9801 Acute bronchospasm: Secondary | ICD-10-CM | POA: Diagnosis not present

## 2015-04-28 DIAGNOSIS — I119 Hypertensive heart disease without heart failure: Secondary | ICD-10-CM | POA: Diagnosis not present

## 2015-05-25 DIAGNOSIS — E785 Hyperlipidemia, unspecified: Secondary | ICD-10-CM | POA: Diagnosis not present

## 2015-05-25 DIAGNOSIS — I429 Cardiomyopathy, unspecified: Secondary | ICD-10-CM | POA: Diagnosis not present

## 2015-05-25 DIAGNOSIS — I119 Hypertensive heart disease without heart failure: Secondary | ICD-10-CM | POA: Diagnosis not present

## 2015-05-25 DIAGNOSIS — R9431 Abnormal electrocardiogram [ECG] [EKG]: Secondary | ICD-10-CM | POA: Diagnosis not present

## 2015-05-30 DIAGNOSIS — I119 Hypertensive heart disease without heart failure: Secondary | ICD-10-CM | POA: Diagnosis not present

## 2015-05-30 DIAGNOSIS — R9431 Abnormal electrocardiogram [ECG] [EKG]: Secondary | ICD-10-CM | POA: Diagnosis not present

## 2015-06-02 DIAGNOSIS — I119 Hypertensive heart disease without heart failure: Secondary | ICD-10-CM | POA: Diagnosis not present

## 2015-06-27 DIAGNOSIS — I429 Cardiomyopathy, unspecified: Secondary | ICD-10-CM | POA: Diagnosis not present

## 2015-06-27 DIAGNOSIS — R9431 Abnormal electrocardiogram [ECG] [EKG]: Secondary | ICD-10-CM | POA: Diagnosis not present

## 2015-06-27 DIAGNOSIS — I119 Hypertensive heart disease without heart failure: Secondary | ICD-10-CM | POA: Diagnosis not present

## 2015-06-27 DIAGNOSIS — E785 Hyperlipidemia, unspecified: Secondary | ICD-10-CM | POA: Diagnosis not present

## 2015-07-21 DIAGNOSIS — K922 Gastrointestinal hemorrhage, unspecified: Secondary | ICD-10-CM

## 2015-07-21 HISTORY — DX: Gastrointestinal hemorrhage, unspecified: K92.2

## 2015-07-25 ENCOUNTER — Observation Stay (HOSPITAL_COMMUNITY)
Admission: EM | Admit: 2015-07-25 | Discharge: 2015-07-29 | Disposition: A | Discharge status: Home / Self Care | Payer: Medicare Other | Attending: Internal Medicine | Admitting: Internal Medicine

## 2015-07-25 ENCOUNTER — Encounter (HOSPITAL_COMMUNITY): Payer: Self-pay | Admitting: Emergency Medicine

## 2015-07-25 DIAGNOSIS — I10 Essential (primary) hypertension: Secondary | ICD-10-CM | POA: Insufficient documentation

## 2015-07-25 DIAGNOSIS — E119 Type 2 diabetes mellitus without complications: Secondary | ICD-10-CM | POA: Diagnosis not present

## 2015-07-25 DIAGNOSIS — Z720 Tobacco use: Secondary | ICD-10-CM | POA: Diagnosis not present

## 2015-07-25 DIAGNOSIS — E785 Hyperlipidemia, unspecified: Secondary | ICD-10-CM | POA: Insufficient documentation

## 2015-07-25 DIAGNOSIS — F319 Bipolar disorder, unspecified: Secondary | ICD-10-CM | POA: Insufficient documentation

## 2015-07-25 DIAGNOSIS — F209 Schizophrenia, unspecified: Secondary | ICD-10-CM | POA: Insufficient documentation

## 2015-07-25 DIAGNOSIS — Z79899 Other long term (current) drug therapy: Secondary | ICD-10-CM | POA: Insufficient documentation

## 2015-07-25 DIAGNOSIS — K921 Melena: Secondary | ICD-10-CM | POA: Diagnosis not present

## 2015-07-25 DIAGNOSIS — K449 Diaphragmatic hernia without obstruction or gangrene: Principal | ICD-10-CM | POA: Insufficient documentation

## 2015-07-25 DIAGNOSIS — D696 Thrombocytopenia, unspecified: Secondary | ICD-10-CM | POA: Insufficient documentation

## 2015-07-25 DIAGNOSIS — F17210 Nicotine dependence, cigarettes, uncomplicated: Secondary | ICD-10-CM | POA: Diagnosis not present

## 2015-07-25 DIAGNOSIS — K5791 Diverticulosis of intestine, part unspecified, without perforation or abscess with bleeding: Secondary | ICD-10-CM | POA: Diagnosis not present

## 2015-07-25 DIAGNOSIS — K922 Gastrointestinal hemorrhage, unspecified: Secondary | ICD-10-CM | POA: Diagnosis present

## 2015-07-25 DIAGNOSIS — D62 Acute posthemorrhagic anemia: Secondary | ICD-10-CM | POA: Insufficient documentation

## 2015-07-25 DIAGNOSIS — E1151 Type 2 diabetes mellitus with diabetic peripheral angiopathy without gangrene: Secondary | ICD-10-CM | POA: Diagnosis not present

## 2015-07-25 DIAGNOSIS — I119 Hypertensive heart disease without heart failure: Secondary | ICD-10-CM | POA: Diagnosis not present

## 2015-07-25 HISTORY — DX: Benign neoplasm of colon, unspecified: D12.6

## 2015-07-25 HISTORY — DX: Other secondary thrombocytopenia: D69.59

## 2015-07-25 HISTORY — DX: Adverse effect of unspecified drugs, medicaments and biological substances, initial encounter: T50.905A

## 2015-07-25 HISTORY — DX: Gastrointestinal hemorrhage, unspecified: K92.2

## 2015-07-25 LAB — COMPREHENSIVE METABOLIC PANEL
ALT: 14 U/L — AB (ref 17–63)
ANION GAP: 8 (ref 5–15)
AST: 12 U/L — ABNORMAL LOW (ref 15–41)
Albumin: 3.8 g/dL (ref 3.5–5.0)
Alkaline Phosphatase: 52 U/L (ref 38–126)
BUN: 11 mg/dL (ref 6–20)
CHLORIDE: 104 mmol/L (ref 101–111)
CO2: 26 mmol/L (ref 22–32)
CREATININE: 1.04 mg/dL (ref 0.61–1.24)
Calcium: 9.9 mg/dL (ref 8.9–10.3)
Glucose, Bld: 133 mg/dL — ABNORMAL HIGH (ref 65–99)
POTASSIUM: 3.8 mmol/L (ref 3.5–5.1)
SODIUM: 138 mmol/L (ref 135–145)
Total Bilirubin: 0.6 mg/dL (ref 0.3–1.2)
Total Protein: 7.8 g/dL (ref 6.5–8.1)

## 2015-07-25 LAB — CBC
HCT: 40.9 % (ref 39.0–52.0)
HEMOGLOBIN: 13.3 g/dL (ref 13.0–17.0)
MCH: 28.4 pg (ref 26.0–34.0)
MCHC: 32.5 g/dL (ref 30.0–36.0)
MCV: 87.2 fL (ref 78.0–100.0)
PLATELETS: 161 10*3/uL (ref 150–400)
RBC: 4.69 MIL/uL (ref 4.22–5.81)
RDW: 15.8 % — ABNORMAL HIGH (ref 11.5–15.5)
WBC: 6.6 10*3/uL (ref 4.0–10.5)

## 2015-07-25 LAB — ABO/RH: ABO/RH(D): O POS

## 2015-07-25 NOTE — ED Provider Notes (Signed)
CSN: [id number]     Arrival date & time 07/25/15  1918 History  By signing my name below, I, Arianna Nassar, attest that this documentation has been prepared under the direction and in the presence of Jola Schmidt, MD. Electronically Signed: Julien Nordmann, ED Scribe. 07/25/2015. 11:54 PM.      Chief Complaint  Patient presents with  . GI Bleeding    The patient said he has had one bloody stool today.  He denies any pain or any other symptoms.  He said it just happened about an hour ago.        The history is provided by the patient. No language interpreter was used.   HPI Comments: Bradley [name] is a 64 y.o. male who has a hx of diverticulitis presents to the Emergency Department complaining of acute onset, moderate, rectal bleeding onset tonight. Pt states he had two episodes of watery, red blood that came out with with his stool. He notes his first episode was around 6:30 and his other episode happened around 10:30. Pt had a colonoscopy two years ago by [facility] and notes it was normal but they removed 3 polyps. He has not had these symptoms before. He denies any pain, abdominal pain and fever.   Past Medical History  Diagnosis Date  . Hypertension   . Hyperlipemia   . Bipolar 2 disorder ([place])   . Hypogonadism male   . Thrombocytopenia, unspecified ([place]) 06/17/2013  . Diabetes mellitus without complication [facility])    Past Surgical History  Procedure Laterality Date  . Colonoscopy     Family History  Problem Relation Age of Onset  . Cancer Brother     abdominal CA  . Colon cancer Neg Hx    Social History  Substance Use Topics  . Smoking status: Current Every Day Smoker -- 1.00 packs/day for 0 years    Types: Cigarettes  . Smokeless tobacco: Never Used  . Alcohol Use: No    Review of Systems  A complete 10 system review of systems was obtained and all systems are negative except as noted in the HPI and PMH.    Allergies  Review of patient's allergies indicates no  known allergies.  Home Medications   Prior to Admission medications   Medication Sig Start Date End Date Taking? Authorizing Provider  amLODipine (NORVASC) 10 MG tablet Take 10 mg by mouth daily.    Historical Provider, MD  divalproex (DEPAKOTE ER) 500 MG 24 hr tablet Take 1,000 mg by mouth at bedtime.    Historical Provider, MD  lisinopril (PRINIVIL,ZESTRIL) 40 MG tablet Take 40 mg by mouth daily.    Historical Provider, MD  metFORMIN (GLUCOPHAGE) 500 MG tablet Take 500 mg by mouth every evening.  05/26/13   Historical Provider, MD  pravastatin (PRAVACHOL) 80 MG tablet Take 80 mg by mouth every evening.     Historical Provider, MD  predniSONE (DELTASONE) 50 MG tablet Take 1 tablet (50 mg total) by mouth daily. 02/26/15   Geronimo Boot, MD  risperidone (RISPERDAL) 4 MG tablet Take 4 mg by mouth 2 (two) times daily.    Historical Provider, MD   Triage vitals: BP 155/87 mmHg  Pulse 97  Temp(Src) 98.4 F (36.9 C) (Oral)  Resp 14  SpO2 94% Physical Exam  Constitutional: He is oriented to person, place, and time. He appears well-developed and well-nourished.  HENT:  Head: Normocephalic and atraumatic.  Eyes: EOM are normal.  Neck: Normal range of motion.  Pulmonary/Chest: No respiratory  distress.  Abdominal: Soft. He exhibits no distension. There is no tenderness.  Genitourinary:  No obvious external hemorrhoids, gross red blood on rectal exam  Musculoskeletal: Normal range of motion.  Neurological: He is alert and oriented to person, place, and time.  Skin: Skin is warm and dry.  Psychiatric: He has a normal mood and affect. Judgment normal.  Nursing note and vitals reviewed.   ED Course  Procedures  DIAGNOSTIC STUDIES: Oxygen Saturation is 94% on RA, low by my interpretation.  COORDINATION OF CARE:  11:53 PM Discussed treatment plan which includes rectal exam, admit to hospital with pt at bedside and pt agreed to plan.  Labs Review Labs Reviewed  COMPREHENSIVE METABOLIC  PANEL - Abnormal; Notable for the following:    Glucose, Bld 133 (*)    AST 12 (*)    ALT 14 (*)    All other components within normal limits  CBC - Abnormal; Notable for the following:    RDW 15.8 (*)    All other components within normal limits  POC OCCULT BLOOD, ED  TYPE AND SCREEN  ABO/RH    Imaging Review No results found. I have personally reviewed and evaluated these images and lab results as part of my medical decision-making.   EKG Interpretation None      MDM   Final diagnoses:  None    This is likely diverticular lower GI bleed.  No abdominal tenderness at this time.  Doubt diverticulitis.  Gross blood on rectal exam.  Vitals and hemoglobin stable.  Patient be admitted for serial CBCs   I personally performed the services described in this documentation, which was scribed in my presence. The recorded information has been reviewed and is accurate.      Jola Schmidt, MD 07/26/15 [phone]

## 2015-07-25 NOTE — ED Notes (Signed)
The patient said he has had one bloody stool today.  He denies any pain or any other symptoms.  He said it just happened about an hour ago.

## 2015-07-25 NOTE — ED Notes (Signed)
Campos, MD at bedside.  

## 2015-07-26 ENCOUNTER — Encounter (HOSPITAL_COMMUNITY): Payer: Self-pay | Admitting: Physician Assistant

## 2015-07-26 DIAGNOSIS — K922 Gastrointestinal hemorrhage, unspecified: Secondary | ICD-10-CM | POA: Diagnosis not present

## 2015-07-26 DIAGNOSIS — I10 Essential (primary) hypertension: Secondary | ICD-10-CM | POA: Diagnosis not present

## 2015-07-26 DIAGNOSIS — D62 Acute posthemorrhagic anemia: Secondary | ICD-10-CM

## 2015-07-26 DIAGNOSIS — K921 Melena: Secondary | ICD-10-CM | POA: Diagnosis present

## 2015-07-26 DIAGNOSIS — F319 Bipolar disorder, unspecified: Secondary | ICD-10-CM | POA: Insufficient documentation

## 2015-07-26 DIAGNOSIS — K5731 Diverticulosis of large intestine without perforation or abscess with bleeding: Secondary | ICD-10-CM | POA: Diagnosis not present

## 2015-07-26 DIAGNOSIS — R935 Abnormal findings on diagnostic imaging of other abdominal regions, including retroperitoneum: Secondary | ICD-10-CM | POA: Diagnosis not present

## 2015-07-26 HISTORY — DX: Gastrointestinal hemorrhage, unspecified: K92.2

## 2015-07-26 LAB — HEMOGLOBIN: HEMOGLOBIN: 11.4 g/dL — AB (ref 13.0–17.0)

## 2015-07-26 LAB — CBC WITH DIFFERENTIAL/PLATELET
BASOS ABS: 0 10*3/uL (ref 0.0–0.1)
BASOS PCT: 0 %
EOS ABS: 0.3 10*3/uL (ref 0.0–0.7)
EOS PCT: 5 %
HCT: 37.5 % — ABNORMAL LOW (ref 39.0–52.0)
HEMOGLOBIN: 12 g/dL — AB (ref 13.0–17.0)
Lymphocytes Relative: 47 %
Lymphs Abs: 3.3 10*3/uL (ref 0.7–4.0)
MCH: 28 pg (ref 26.0–34.0)
MCHC: 32 g/dL (ref 30.0–36.0)
MCV: 87.4 fL (ref 78.0–100.0)
Monocytes Absolute: 0.8 10*3/uL (ref 0.1–1.0)
Monocytes Relative: 11 %
NEUTROS PCT: 37 %
Neutro Abs: 2.6 10*3/uL (ref 1.7–7.7)
PLATELETS: 176 10*3/uL (ref 150–400)
RBC: 4.29 MIL/uL (ref 4.22–5.81)
RDW: 15.7 % — ABNORMAL HIGH (ref 11.5–15.5)
WBC: 7 10*3/uL (ref 4.0–10.5)

## 2015-07-26 LAB — PREPARE RBC (CROSSMATCH)

## 2015-07-26 LAB — CBC
HEMATOCRIT: 34.6 % — AB (ref 39.0–52.0)
HEMOGLOBIN: 11 g/dL — AB (ref 13.0–17.0)
MCH: 27.8 pg (ref 26.0–34.0)
MCHC: 31.8 g/dL (ref 30.0–36.0)
MCV: 87.4 fL (ref 78.0–100.0)
Platelets: 156 10*3/uL (ref 150–400)
RBC: 3.96 MIL/uL — ABNORMAL LOW (ref 4.22–5.81)
RDW: 15.4 % (ref 11.5–15.5)
WBC: 6.1 10*3/uL (ref 4.0–10.5)

## 2015-07-26 LAB — COMPREHENSIVE METABOLIC PANEL
ALT: 14 U/L — ABNORMAL LOW (ref 17–63)
ANION GAP: 8 (ref 5–15)
AST: 14 U/L — AB (ref 15–41)
Albumin: 3.3 g/dL — ABNORMAL LOW (ref 3.5–5.0)
Alkaline Phosphatase: 44 U/L (ref 38–126)
BUN: 12 mg/dL (ref 6–20)
CHLORIDE: 105 mmol/L (ref 101–111)
CO2: 25 mmol/L (ref 22–32)
Calcium: 9.3 mg/dL (ref 8.9–10.3)
Creatinine, Ser: 0.96 mg/dL (ref 0.61–1.24)
Glucose, Bld: 140 mg/dL — ABNORMAL HIGH (ref 65–99)
POTASSIUM: 4.1 mmol/L (ref 3.5–5.1)
Sodium: 138 mmol/L (ref 135–145)
TOTAL PROTEIN: 7.1 g/dL (ref 6.5–8.1)
Total Bilirubin: 0.4 mg/dL (ref 0.3–1.2)

## 2015-07-26 LAB — PROTIME-INR
INR: 1.16 (ref 0.00–1.49)
PROTHROMBIN TIME: 14.9 s (ref 11.6–15.2)

## 2015-07-26 LAB — HEMATOCRIT: HEMATOCRIT: 36 % — AB (ref 39.0–52.0)

## 2015-07-26 LAB — APTT: APTT: 30 s (ref 24–37)

## 2015-07-26 MED ORDER — SODIUM CHLORIDE 0.9 % IV SOLN: Freq: Once | INTRAVENOUS | Status: DC

## 2015-07-26 MED ORDER — SODIUM CHLORIDE 0.9 % IJ SOLN
3.0000 mL | Freq: Two times a day (BID) | INTRAMUSCULAR | Status: DC
  Administered 2015-07-26 – 2015-07-27 (×2): 3 mL via INTRAVENOUS

## 2015-07-26 MED ORDER — POTASSIUM CHLORIDE IN NACL 20-0.9 MEQ/L-% IV SOLN
INTRAVENOUS | Status: DC
  Administered 2015-07-26 – 2015-07-28 (×3): via INTRAVENOUS
  Filled 2015-07-26 (×6): qty 1000

## 2015-07-26 MED ORDER — DIVALPROEX SODIUM ER 500 MG PO TB24
1000.0000 mg | ORAL_TABLET | Freq: Every day | ORAL | Status: DC
  Administered 2015-07-26 – 2015-07-28 (×3): 1000 mg via ORAL
  Filled 2015-07-26 (×3): qty 2

## 2015-07-26 MED ORDER — RISPERIDONE 2 MG PO TABS
4.0000 mg | ORAL_TABLET | Freq: Two times a day (BID) | ORAL | Status: DC
  Administered 2015-07-26 – 2015-07-29 (×8): 4 mg via ORAL
  Filled 2015-07-26 (×9): qty 2

## 2015-07-26 MED ORDER — DIPHENHYDRAMINE HCL 50 MG/ML IJ SOLN: 25.0000 mg | Freq: Once | INTRAMUSCULAR | Status: DC

## 2015-07-26 MED ORDER — METOPROLOL TARTRATE 12.5 MG HALF TABLET
12.5000 mg | ORAL_TABLET | Freq: Two times a day (BID) | ORAL | Status: DC
  Administered 2015-07-26 – 2015-07-29 (×7): 12.5 mg via ORAL
  Filled 2015-07-26 (×7): qty 1

## 2015-07-26 NOTE — Consult Note (Signed)
[facility] Consult: 11:45 AM 07/26/2015  LOS: 0 days    Referring Provider: Dr Broadus John  Primary Care Physician:  Benito Mccreedy, MD Primary Gastroenterologist:  Dr. Henrene Pastor    Reason for Consultation:  Painless hematochezia.    HPI: Bradley [name] is a 64 y.o. male.  Hx bipolar disorder/schizophrenia, htn, hld. 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  Erectile dyysfunction.    Patient has known severe diverticulosis. Sigmoid diverticulitis with abscess 09/2014 requiring percutaneous drainage, had resolved on follow-up CT 10/2014 12/2013  Colonoscopy.  For surveillance of small adenomas 2009.  Found severe, pan-diverticulosis, sigmoid stenosis, 2 diminutive polyps removed (path: Tubular adenomas, no high-grade dysplasia).  Presented to emergency room yesterday evening after experiencing one episode of painless hematochezia at home. He's had about 6 so far, the last was an hour or so ago. He denies abdominal pain, nausea, dizziness, chest pain, palpitations. He has never had lower GI bleeding before.  He has not been using aspirin or nonsteroidal medications and is non-no platelet disrupting or anticoagulation medication. Vital signs on arrival and during admission have been stable with no signs of hypotension or tachycardia.  Hgb 09/2014 - 02/2015 ranged 10.3-12.7. It was 13.3 at ED arrival yesterday  and this morning is 11.4. MCV normal. Platelets normal.    Past Medical History  Diagnosis Date  . Hypertension   . Hyperlipemia   . Bipolar 2 disorder ([place]) 2005    Also carries diagnosis of schizophrenia.  . Hypogonadism male     Erectile dysfunction  . 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    . Diabetes mellitus without complication  ([place])   . Colon adenomas 2009, 2015    Past Surgical History  Procedure Laterality Date  . Colonoscopy  2009, 2015    Prior to Admission medications   Medication Sig Start Date End Date Taking? Authorizing Provider  amLODipine (NORVASC) 10 MG tablet Take 10 mg by mouth daily.   Yes Historical Provider, MD  divalproex (DEPAKOTE ER) 500 MG 24 hr tablet Take 1,000 mg by mouth at bedtime.   Yes Historical Provider, MD  lisinopril (PRINIVIL,ZESTRIL) 40 MG tablet Take 40 mg by mouth daily.   Yes Historical Provider, MD  metoprolol succinate (TOPROL-XL) 25 MG 24 hr tablet Take 25 mg by mouth daily. 06/27/15  Yes Historical Provider, MD  pravastatin (PRAVACHOL) 80 MG tablet Take 80 mg by mouth every evening.    Yes Historical Provider, MD  risperidone (RISPERDAL) 4 MG tablet Take 4 mg by mouth 2 (two) times daily.   Yes Historical Provider, MD  predniSONE (DELTASONE) 50 MG tablet Take 1 tablet (50 mg total) by mouth daily. Patient not taking: Reported on 07/25/2015 02/26/15   Geronimo Boot, MD    Scheduled Meds: . sodium chloride   Intravenous Once  . diphenhydrAMINE  25 mg Intravenous Once  . divalproex  1,000 mg Oral QHS  . metoprolol tartrate  12.5 mg Oral BID  . risperidone  4 mg Oral BID  . sodium chloride  3  mL Intravenous Q12H   Infusions: . 0.9 % NaCl with KCl 20 mEq / L 125 mL/hr at 07/26/15 0441   PRN Meds:    Allergies as of 07/25/2015  . (No Known Allergies)    Family History  Problem Relation Age of Onset  . Cancer Brother     abdominal CA  . Colon cancer Neg Hx     Social History   Social History  . Marital Status: Divorced    Spouse Name: N/A  . Number of Children: N/A  . Years of Education: N/A   Occupational History  . Not on file.   Social History Main Topics  . Smoking status: Current Every Day Smoker -- 1.00 packs/day for 0 years    Types: Cigarettes  . Smokeless tobacco: Never Used  . Alcohol Use: No  . Drug Use: No  . Sexual Activity: Not on  file   Other Topics Concern  . Not on file   Social History Narrative   Patient originally from [place].    REVIEW OF SYSTEMS: Constitutional:   No weakness. Weight is stable. No lack of energy. ENT:  No nose bleeds Pulm:   No cough. No dyspnea on exertion. CV:  No palpitations, no LE edema.  no chest pain. GU:  No hematuria, no frequency GI:  Per HPI Heme:   No unusual bleeding other than the acute lower GI bleeding as per history of present illness. No excessive bruising.   Transfusions:   None ever Neuro:  No headaches, no peripheral tingling or numbness Derm:  No itching, no rash or sores.  Endocrine:  No sweats or chills.  No polyuria or dysuria Immunization:   Did not inquire. Travel:  None beyond local counties in last few months.    PHYSICAL EXAM: Vital signs in last 24 hours: Filed Vitals:   07/26/15 0432 07/26/15 0826  BP: 143/71 134/63  Pulse: 68 66  Temp: 98.8 F (37.1 C) 98.7 F (37.1 C)  Resp: 19 18   Wt Readings from Last 3 Encounters:  07/26/15 102.286 kg (225 lb 8 oz)  02/25/15 102.059 kg (225 lb)  10/13/14 100.699 kg (222 lb)   General:  pleasant, comfortable, well appearing but mildly obese AAM. Head:   No facial asymmetry or swelling. No signs of head trauma  Eyes:   No scleral icterus, no conjunctival pallor. EOMI. Ears:   No hearing loss  Nose:   No discharge Mouth:   Oral mucosa is pink and moist. Neck:   No JVD, no TMG or masses. Lungs:   Clear bilaterally. No cough. No dyspnea Heart:  RRR. No MRG. S1/S2 audible Abdomen:   Soft. Obese. Not tender or distended. Bowel sounds active. No masses, hernias or organomegaly.   Rectal:  deferred.   Musc/Skeltl:  no joint contracture deformities, swelling or erythema Extremities:   No CCE.  Neurologic:   Patient is alert. Oriented 3. Moves all 4 limbs easily, strength not tested. No tremor. No gross neurologic deficits. Skin:   No rash, no sores. Tattoos:   None Nodes:   No cervical adenopathy.     Psych:   Pleasant, calm, cooperative.  Intake/Output from previous day:   Intake/Output this shift:    LAB RESULTS:  Recent Labs  07/25/15 2033 07/26/15 0358 07/26/15 0900  WBC 6.6 7.0  --   HGB 13.3 12.0* 11.4*  HCT 40.9 37.5* 36.0*  PLT 161 176  --    BMET Lab Results  Component Value Date  NA 138 07/26/2015   NA 138 07/25/2015   NA 140 02/25/2015   K 4.1 07/26/2015   K 3.8 07/25/2015   K 4.5 02/25/2015   CL 105 07/26/2015   CL 104 07/25/2015   CL 105 02/25/2015   CO2 25 07/26/2015   CO2 26 07/25/2015   CO2 27 02/25/2015   GLUCOSE 140* 07/26/2015   GLUCOSE 133* 07/25/2015   GLUCOSE 109* 02/25/2015   BUN 12 07/26/2015   BUN 11 07/25/2015   BUN 6 02/25/2015   CREATININE 0.96 07/26/2015   CREATININE 1.04 07/25/2015   CREATININE 1.02 02/25/2015   CALCIUM 9.3 07/26/2015   CALCIUM 9.9 07/25/2015   CALCIUM 9.0 02/25/2015   LFT  Recent Labs  07/25/15 2033 07/26/15 0358  PROT 7.8 7.1  ALBUMIN 3.8 3.3*  AST 12* 14*  ALT 14* 14*  ALKPHOS 52 44  BILITOT 0.6 0.4   PT/INR Lab Results  Component Value Date   INR 1.16 07/26/2015   INR 1.29 10/14/2014    RADIOLOGY STUDIES: No results found.  ENDOSCOPIC STUDIES: Per HPI.    IMPRESSION:   *  Painless hematochezia.  Presumed diverticular bleed.  Patient stable. Hemoglobin has dropped, as would be expected.   PLAN:     *  Supportive care for now. Keep him on clear liquids. If bleeding continues, we will need to undergo nuclear medicine bleeding scan.  no plans for repeating colonoscopy.  Serial hemoglobins.    Azucena Freed  07/26/2015, 11:45 AM Pager: [phone]      Attending physician's note   I have taken a history, examined the patient and reviewed the chart. I agree with the Advanced Practitioner's note, impression and recommendations. Painless hematochezia with mild ABL anemia-typical presentation for a diverticular bleed. No plans to repeat his  colonoscopy unless he has recurrent or  ongoing bleeding. Clear liquid diet for now. Trend CBC.  Lucio Edward, MD [facility] [phone] Mon-Fri 8a-5p [phone] after 5p, weekends, holidays

## 2015-07-26 NOTE — H&P (Signed)
[facility] History and Physical  Bradley [name] [id number] DOB: 1950-11-14 DOA: 07/25/2015  Referring physician: ED physician PCP: Bradley Mccreedy, MD    Chief Complaint: BRBPR  HPI: Bradley [name] is a 64 y.o. male with PMH of bipolar disorder, hypertension, hyperlipidemia, diet-controlled diabetes, and diverticulosis who presents following an episode of bright red blood per rectum at home. Patient states that he was in his usual state of good health until this episode occurred. He never had prior experience with these symptoms. He denies any abdominal pain nausea vomiting or diarrhea. He denies any chest pain headache or palpitations and states that aside from the bleeding, he is asymptomatic. Patient had a second episode in the emergency department. He denies any recent NSAID use or alcohol consumption. He denies easy bruising or bleeding then does not take anticoagulants. He denies any hematemesis. He denies any recent weight loss fevers chills or sweats. He had a colonoscopy approximately 2 years ago with identification of sigmoid diverticula and 3 polyps which were removed and pathology reportedly benign.  In the emergency department, patient was found to be afebrile and hemodynamically stable. Initial blood work revealed a hemoglobin of 13.3. Patient was given IV fluid while in the ED, remained stable hemodynamically, and was admitted for further evaluation and management of his bright red blood per rectum.  Where does patient live?   At home   Can patient participate in ADLs?  Yes        Review of Systems:   General: no fevers, chills, no changes in body weight, no poor appetite, no fatigue HEENT: no blurry vision, hearing changes or sore throat Pulm: no dyspnea, coughing, wheezing CV: no chest pain, palpitations Abd: no nausea, vomiting, abdominal pain, diarrhea, constipation GU: no dysuria, burning on urination, increased urinary frequency, hematuria  Ext: no leg  edema Neuro: no unilateral weakness, numbness, or tingling, no vision change or hearing loss Skin: no rash MSK: No muscle spasm, no deformity, no limitation of range of movement in spin Heme: No easy bruising.  Travel history: No recent long distant travel.  Allergy: No Known Allergies  Past Medical History  Diagnosis Date  . Hypertension   . Hyperlipemia   . Bipolar 2 disorder ([place])   . Hypogonadism male   . Thrombocytopenia, unspecified ([place]) 06/17/2013  . Diabetes mellitus without complication [facility])     Past Surgical History  Procedure Laterality Date  . Colonoscopy      Social History:  reports that he has been smoking Cigarettes.  He has been smoking about 1.00 pack per day for the past 0 years. He has never used smokeless tobacco. He reports that he does not drink alcohol or use illicit drugs.  Family History:  Family History  Problem Relation Age of Onset  . Cancer Brother     abdominal CA  . Colon cancer Neg Hx      Prior to Admission medications   Medication Sig Start Date End Date Taking? Authorizing Provider  amLODipine (NORVASC) 10 MG tablet Take 10 mg by mouth daily.   Yes Historical Provider, MD  divalproex (DEPAKOTE ER) 500 MG 24 hr tablet Take 1,000 mg by mouth at bedtime.   Yes Historical Provider, MD  lisinopril (PRINIVIL,ZESTRIL) 40 MG tablet Take 40 mg by mouth daily.   Yes Historical Provider, MD  metoprolol succinate (TOPROL-XL) 25 MG 24 hr tablet Take 25 mg by mouth daily. 06/27/15  Yes Historical Provider, MD  pravastatin (PRAVACHOL) 80 MG tablet Take 80 mg by mouth  every evening.    Yes Historical Provider, MD  risperidone (RISPERDAL) 4 MG tablet Take 4 mg by mouth 2 (two) times daily.   Yes Historical Provider, MD  predniSONE (DELTASONE) 50 MG tablet Take 1 tablet (50 mg total) by mouth daily. Patient not taking: Reported on 07/25/2015 02/26/15   Bradley Boot, MD    Physical Exam: Filed Vitals:   07/26/15 0015 07/26/15 0030 07/26/15 0144  07/26/15 0432  BP: 156/80 144/87 159/74 143/71  Pulse: 59 58 57 68  Temp:   98.2 F (36.8 C) 98.8 F (37.1 C)  TempSrc:   Oral Oral  Resp: 25 18 18 19   Height:   5\' 8"  (1.727 m)   Weight:   102.286 kg (225 lb 8 oz)   SpO2: 92% 90% 94% 94%   General: Not in acute distress HEENT:       Eyes: PERRL, EOMI, no scleral icterus.       ENT: No discharge from the ears and nose, no pharynx injection, no tonsillar enlargement.        Neck: No JVD, no bruit, no mass felt. Heme: No neck lymph node enlargement. Cardiac: S1/S2, RRR, No murmurs, No gallops or rubs. Pulm: Good air movement bilaterally. No rales, wheezing, rhonchi or rubs. Abd: Soft, nondistended, nontender, no rebound pain, no organomegaly, BS present. Ext: No pitting leg edema bilaterally. 2+DP/PT pulse bilaterally. Musculoskeletal: No joint deformities, No joint redness or warmth, no limitation of ROM in spin. Skin: No rashes.  Neuro: Alert, oriented X3, cranial nerves II-XII grossly intact, non-focal. Psych: Patient is not overtly psychotic, no suicidal or hemocidal ideation.  Labs on Admission:  Basic Metabolic Panel:  Recent Labs Lab 07/25/15 2033 07/26/15 0358  NA 138 138  K 3.8 4.1  CL 104 105  CO2 26 25  GLUCOSE 133* 140*  BUN 11 12  CREATININE 1.04 0.96  CALCIUM 9.9 9.3   Liver Function Tests:  Recent Labs Lab 07/25/15 2033 07/26/15 0358  AST 12* 14*  ALT 14* 14*  ALKPHOS 52 44  BILITOT 0.6 0.4  PROT 7.8 7.1  ALBUMIN 3.8 3.3*   No results for input(s): LIPASE, AMYLASE in the last 168 hours. No results for input(s): AMMONIA in the last 168 hours. CBC:  Recent Labs Lab 07/25/15 2033 07/26/15 0358  WBC 6.6 7.0  NEUTROABS  --  2.6  HGB 13.3 12.0*  HCT 40.9 37.5*  MCV 87.2 87.4  PLT 161 176   Cardiac Enzymes: No results for input(s): CKTOTAL, CKMB, CKMBINDEX, TROPONINI in the last 168 hours.  BNP (last 3 results)  Recent Labs  02/25/15 2320  BNP 7.2    ProBNP (last 3 results) No  results for input(s): PROBNP in the last 8760 hours.  CBG: No results for input(s): GLUCAP in the last 168 hours.  Radiological Exams on Admission: No results found.  EKG: Not done in ED, will get one as appropriate.   Assessment/Plan  1. BRBPR  Suspect a lower abdominal source given fresh blood, low BUN, and known diverticula. 2 units of packed red blood cells of been placed on hold. Serial H&H will be drawn overnight. Patient will be kept nothing by mouth and normal saline will be administered at a rate of 1 25 mL per hour. Protonix was administered and will be continued. Pharmacologic PT prophylaxis is been withheld and SCDs will be used. There's been no hematemesis and the patient has no history of liver disease. GI consultation has been requested.  2. Bipolar  d/o  Stable. Continue risperdal and depakote. Not overtly psychotic and denies SI/HI/hallucinations.   3. HTN Holding home antihypertensives initially. Will resume cautiously. COntinued low-dose beta-blocker to avoid w/d.    DVT ppx: SCDs  Code Status: Full code Family Communication: None at bed side.              Yes, patient's       at bed side Disposition Plan: Admit to inpatient   Date of Service 07/26/2015    Ilene Qua Rosalba Totty [facility] Pager [phone]  If 7PM-7AM, please contact night-coverage [email or web address] Password [email or web address] 07/26/2015, 7:08 AM

## 2015-07-26 NOTE — Progress Notes (Signed)
Pt seen and examined, admitted this am by Dr.Opyd 63/M with bipolar disorder, diverticulosis on scope in 2015, here painless lower GI bleed since last pm, total 5-6 episodes thus far, hb now 11.4 -IVF, monitor Hb, hold ACE and Norvasc -Bleeding scan if active bleeding ensues -[facility] consulted  Domenic Polite, MD [phone]

## 2015-07-26 NOTE — Progress Notes (Signed)
Page to K. Schorr for clarification regarding blood administration for this patient. I unit of PRBC's prepared and ready in the blood bank for patient, hold for now. Hold 1 time dose of benadryl 25 mg IV until/if pt needs prior to blood administration. Blood consent to chart. Hgb at this time 12.0. Repeat HGb scheduled for 0800. Pt with 1 large dark red bloody stool. Will continue to monitor closely. Dorthey Sawyer, RN

## 2015-07-26 NOTE — ED Notes (Signed)
Gave report to the admitting RN; waiting for a bed to be delivered to the unit, admitting RN to call when bed is delivered.

## 2015-07-26 NOTE — Progress Notes (Signed)
Utilization review completed. Riad Wagley, RN, BSN. 

## 2015-07-26 NOTE — Progress Notes (Signed)
New Admission Note: Page to [facility] admissions to notify admitting physician that patient has arrived to the floor. Bed 6E20  Arrival Method: Via Stretcher from [facility] ED Mental Orientation: Alert and Oriented x 4 Telemetry: Pt initiated on telemetry per order Assessment: Completed Skin: Intact IV: Saline Locked Pain: Denies Tubes: None Safety Measures: Safety Fall Prevention Plan has been given, discussed and signed Admission: To be Completed 6 [place] Orientation: Patient has been orientated to the room, unit and staff.  Family: None at pt bedside  Orders have been reviewed and implemented. Will continue to monitor the patient. Call light has been placed within reach.  Mady Gemma, RN-BC Phone: [phone]

## 2015-07-26 NOTE — ED Notes (Signed)
RN called to check on status of hospital bed; floor staff states they are still working on it and will call ER back

## 2015-07-27 ENCOUNTER — Observation Stay (HOSPITAL_COMMUNITY): Payer: Medicare Other

## 2015-07-27 DIAGNOSIS — F319 Bipolar disorder, unspecified: Secondary | ICD-10-CM

## 2015-07-27 DIAGNOSIS — D62 Acute posthemorrhagic anemia: Secondary | ICD-10-CM | POA: Diagnosis not present

## 2015-07-27 DIAGNOSIS — K5731 Diverticulosis of large intestine without perforation or abscess with bleeding: Secondary | ICD-10-CM | POA: Diagnosis not present

## 2015-07-27 DIAGNOSIS — K922 Gastrointestinal hemorrhage, unspecified: Secondary | ICD-10-CM | POA: Diagnosis not present

## 2015-07-27 DIAGNOSIS — K921 Melena: Secondary | ICD-10-CM | POA: Diagnosis not present

## 2015-07-27 LAB — CBC
HEMATOCRIT: 34.5 % — AB (ref 39.0–52.0)
HEMATOCRIT: 35.8 % — AB (ref 39.0–52.0)
HEMOGLOBIN: 11.5 g/dL — AB (ref 13.0–17.0)
Hemoglobin: 10.9 g/dL — ABNORMAL LOW (ref 13.0–17.0)
MCH: 27.8 pg (ref 26.0–34.0)
MCH: 28 pg (ref 26.0–34.0)
MCHC: 31.6 g/dL (ref 30.0–36.0)
MCHC: 32.1 g/dL (ref 30.0–36.0)
MCV: 88 fL (ref 78.0–100.0)
MCV: 87.3 fL (ref 78.0–100.0)
Platelets: 145 10*3/uL — ABNORMAL LOW (ref 150–400)
Platelets: 149 10*3/uL — ABNORMAL LOW (ref 150–400)
RBC: 3.92 MIL/uL — ABNORMAL LOW (ref 4.22–5.81)
RBC: 4.1 MIL/uL — ABNORMAL LOW (ref 4.22–5.81)
RDW: 15.6 % — AB (ref 11.5–15.5)
RDW: 15.3 % (ref 11.5–15.5)
WBC: 6.5 10*3/uL (ref 4.0–10.5)
WBC: 5.6 10*3/uL (ref 4.0–10.5)

## 2015-07-27 LAB — BASIC METABOLIC PANEL
Anion gap: 10 (ref 5–15)
BUN: 8 mg/dL (ref 6–20)
CALCIUM: 8.6 mg/dL — AB (ref 8.9–10.3)
CO2: 23 mmol/L (ref 22–32)
Chloride: 104 mmol/L (ref 101–111)
Creatinine, Ser: 0.92 mg/dL (ref 0.61–1.24)
GFR calc Af Amer: >60 mL/min (ref >60)
GLUCOSE: 98 mg/dL (ref 65–99)
Potassium: 4.2 mmol/L (ref 3.5–5.1)
Sodium: 137 mmol/L (ref 135–145)

## 2015-07-27 LAB — HEMOGLOBIN A1C
HEMOGLOBIN A1C: 6.3 % — AB (ref 4.8–5.6)
MEAN PLASMA GLUCOSE: 134 mg/dL

## 2015-07-27 MED ORDER — TECHNETIUM TC 99M-LABELED RED BLOOD CELLS IV KIT
26.3000 | PACK | Freq: Once | INTRAVENOUS | Status: AC | PRN
  Administered 2015-07-27: 26 via INTRAVENOUS

## 2015-07-27 MED ORDER — PANTOPRAZOLE SODIUM 40 MG IV SOLR: 40.0000 mg | Freq: Two times a day (BID) | INTRAVENOUS | Status: DC

## 2015-07-27 MED ORDER — PANTOPRAZOLE SODIUM 40 MG IV SOLR
40.0000 mg | Freq: Two times a day (BID) | INTRAVENOUS | Status: DC
  Administered 2015-07-27 – 2015-07-28 (×3): 40 mg via INTRAVENOUS
  Filled 2015-07-27 (×3): qty 40

## 2015-07-27 NOTE — Progress Notes (Signed)
Daily Rounding Note  07/27/2015, 9:45 AM  LOS: 1 day   SUBJECTIVE:       Last bleeding or BM was yesterday AM.  Feels well.  No complaints.  On clears  OBJECTIVE:         Vital signs in last 24 hours:    Temp:  [97.8 F (36.6 C)-98.2 F (36.8 C)] 98.2 F (36.8 C) (12/07 0631) Pulse Rate:  [56-62] 62 (12/07 0902) Resp:  [18-20] 20 (12/07 0631) BP: (143-148)/(64-75) 147/75 mmHg (12/07 0631) SpO2:  [95 %-98 %] 95 % (12/07 0631) Weight:  [104.101 kg (229 lb 8 oz)] 104.101 kg (229 lb 8 oz) (12/06 2047) Last BM Date: 07/26/15 Filed Weights   07/26/15 0144 07/26/15 2047  Weight: 102.286 kg (225 lb 8 oz) 104.101 kg (229 lb 8 oz)   General: obese, looks well.    Heart: RRR Chest: clear bil.  No dyspnea or cough Abdomen: soft, obese, NT, ND.  BS active  Extremities: no CCE Neuro/Psych:  Oriented x 3.  Calm.  Cooperative, very polite.  No gross deficits.   Intake/Output from previous day: 12/06 0701 - 12/07 0700 In: 2704.6 [P.O.:960; I.V.:1744.6] Out: 275 [Urine:275]  Intake/Output this shift:    Lab Results:  Recent Labs  07/26/15 0358 07/26/15 0900 07/26/15 1824 07/27/15 0610  WBC 7.0  --  6.1 6.5  HGB 12.0* 11.4* 11.0* 10.9*  HCT 37.5* 36.0* 34.6* 34.5*  PLT 176  --  156 145*   BMET  Recent Labs  07/25/15 2033 07/26/15 0358 07/27/15 0610  NA 138 138 137  K 3.8 4.1 4.2  CL 104 105 104  CO2 26 25 23   GLUCOSE 133* 140* 98  BUN 11 12 8   CREATININE 1.04 0.96 0.92  CALCIUM 9.9 9.3 8.6*   LFT  Recent Labs  07/25/15 2033 07/26/15 0358  PROT 7.8 7.1  ALBUMIN 3.8 3.3*  AST 12* 14*  ALT 14* 14*  ALKPHOS 52 44  BILITOT 0.6 0.4   PT/INR  Recent Labs  07/26/15 0358  LABPROT 14.9  INR 1.16   Hepatitis Panel No results for input(s): HEPBSAG, HCVAB, HEPAIGM, HEPBIGM in the last 72 hours.  Studies/Results: No results found.   Scheduled Meds: . sodium chloride   Intravenous Once  .  diphenhydrAMINE  25 mg Intravenous Once  . divalproex  1,000 mg Oral QHS  . metoprolol tartrate  12.5 mg Oral BID  . risperidone  4 mg Oral BID  . sodium chloride  3 mL Intravenous Q12H   Continuous Infusions: . 0.9 % NaCl with KCl 20 mEq / L 50 mL/hr at 07/26/15 1104   PRN Meds:.   ASSESMENT:   *  Painless hematochezia.  Presumed diverticular bleed. Patient stable. Hemoglobin declined, as would be expected.  However it is relatively stable.  No need of tranfusion to date.   *  Thrombocytopenia, non-critical.  This is chronic issue related to meds.   *  Bipolar disorder.    PLAN   *  Carb mod diet.  ? Home later today or in AM.  Stop tele.  If here in AM: CBC.   GI follow up prn. No ASA or NSAIDs for 2 weeks (does not use normally)    Azucena Freed  07/27/2015, 9:45 AM Pager: [phone]     Attending physician's note   I have taken an interval history, reviewed the chart and examined the patient. I agree with the Advanced  Practitioner's note, impression and recommendations. No recurrent bleeding. Hb drifting and equilibrating. Resolving diverticular bleed with ABL anemia. Would advance diet and plan for discharge later today or tomorrow. Avoid ASA/NSAIDs for 2 weeks. GI signing off. He can follow up with his PCP post discharge. GI follow up with Dr. Henrene Pastor as needed.  Lucio Edward, MD [facility] [phone] Mon-Fri 8a-5p [phone] after 5p, weekends, holidays

## 2015-07-27 NOTE — Progress Notes (Signed)
[facility] PROGRESS NOTE  Bradley [name] [id number] DOB: 12-30-1950 DOA: 07/25/2015 PCP: Benito Mccreedy, MD  Assessment/Plan: 1. Acute diverticular bleed: - persistent and nuclear scan ordered. Showed some uptake in the left upper quadrant.  - hemoglobin is stable. Started him on IV protonix for possible upper GI ulcer or upper GI bleed.  - gi consulted and recommendations given.    Bipolar disorder: Resume home medications.   Code Status: full code.  Family Communication: none at bedside.  Disposition Plan: pending further work up. Possibly home.    Consultants:  Gastroenterology.  Procedures:  NM blood scan.  Antibiotics:  none  HPI/Subjective: Reports multiple marble sized blood clots.   Objective: Filed Vitals:   07/27/15 0902 07/27/15 0949  BP:  134/83  Pulse: 62 55  Temp:  97.7 F (36.5 C)  Resp:  17    Intake/Output Summary (Last 24 hours) at 07/27/15 1925 Last data filed at 07/27/15 1000  Gross per 24 hour  Intake 3304.58 ml  Output    275 ml  Net 3029.58 ml   Filed Weights   07/26/15 0144 07/26/15 2047  Weight: 102.286 kg (225 lb 8 oz) 104.101 kg (229 lb 8 oz)    Exam:   General:  Alert comfortable.   Cardiovascular: s1s2  Respiratory: ctab  Abdomen: soft NT nd bs+  Musculoskeletal: no pedal edema.   Data Reviewed: Basic Metabolic Panel:  Recent Labs Lab 07/25/15 2033 07/26/15 0358 07/27/15 0610  NA 138 138 137  K 3.8 4.1 4.2  CL 104 105 104  CO2 26 25 23   GLUCOSE 133* 140* 98  BUN 11 12 8   CREATININE 1.04 0.96 0.92  CALCIUM 9.9 9.3 8.6*   Liver Function Tests:  Recent Labs Lab 07/25/15 2033 07/26/15 0358  AST 12* 14*  ALT 14* 14*  ALKPHOS 52 44  BILITOT 0.6 0.4  PROT 7.8 7.1  ALBUMIN 3.8 3.3*   No results for input(s): LIPASE, AMYLASE in the last 168 hours. No results for input(s): AMMONIA in the last 168 hours. CBC:  Recent Labs Lab 07/25/15 2033 07/26/15 0358 07/26/15 0900  07/26/15 1824 07/27/15 0610 07/27/15 1811  WBC 6.6 7.0  --  6.1 6.5 5.6  NEUTROABS  --  2.6  --   --   --   --   HGB 13.3 12.0* 11.4* 11.0* 10.9* 11.5*  HCT 40.9 37.5* 36.0* 34.6* 34.5* 35.8*  MCV 87.2 87.4  --  87.4 88.0 87.3  PLT 161 176  --  156 145* 149*   Cardiac Enzymes: No results for input(s): CKTOTAL, CKMB, CKMBINDEX, TROPONINI in the last 168 hours. BNP (last 3 results)  Recent Labs  02/25/15 2320  BNP 7.2    ProBNP (last 3 results) No results for input(s): PROBNP in the last 8760 hours.  CBG: No results for input(s): GLUCAP in the last 168 hours.  No results found for this or any previous visit (from the past 240 hour(s)).   Studies: Nm Gi Blood Loss  07/27/2015  CLINICAL DATA:  GI bleeding.  Bloody stool this morning. EXAM: NUCLEAR MEDICINE GASTROINTESTINAL BLEEDING SCAN TECHNIQUE: Sequential abdominal images were obtained following intravenous administration of Tc-44m labeled red blood cells. RADIOPHARMACEUTICALS:  26.0 MCi Tc-7m in-vitro labeled red cells. COMPARISON:  10/26/2014 FINDINGS: No specific findings are identified to suggest active lower gastrointestinal tract bleeding. There is ill-defined radiotracer activity within the left upper quadrant of the abdomen which appears to conform to the distribution of the small bowel loops. IMPRESSION: 1. No  evidence for active lower GI bleed. 2. Increased radiotracer uptake within the left upper quadrant of the abdomen appears to move but does not conform to the expected distribution of the colon and is felt a likely represent an upper GI bleed. Electronically Signed   By: Kerby Moors M.D.   On: 07/27/2015 16:56    Scheduled Meds: . sodium chloride   Intravenous Once  . diphenhydrAMINE  25 mg Intravenous Once  . divalproex  1,000 mg Oral QHS  . metoprolol tartrate  12.5 mg Oral BID  . risperidone  4 mg Oral BID  . sodium chloride  3 mL Intravenous Q12H   Continuous Infusions: . 0.9 % NaCl with KCl 20 mEq / L  50 mL/hr at 07/27/15 1000    Active Problems:   Lower GI bleed   Acute lower GI bleeding   Bipolar I disorder (HCC)   HTN (hypertension), benign   Hematochezia    Time spent: 25 min    [facility] Pager [phone]. If 7PM-7AM, please contact night-coverage at [email or web address], password [email or web address] 07/27/2015, 7:25 PM  LOS: 1 day

## 2015-07-28 ENCOUNTER — Encounter (HOSPITAL_COMMUNITY): Payer: Self-pay | Admitting: Physician Assistant

## 2015-07-28 ENCOUNTER — Encounter (HOSPITAL_COMMUNITY)
Admission: EM | Disposition: A | Discharge status: Home / Self Care | Payer: Self-pay | Source: Home / Self Care | Attending: Internal Medicine

## 2015-07-28 DIAGNOSIS — R935 Abnormal findings on diagnostic imaging of other abdominal regions, including retroperitoneum: Secondary | ICD-10-CM | POA: Diagnosis not present

## 2015-07-28 DIAGNOSIS — K921 Melena: Secondary | ICD-10-CM | POA: Diagnosis not present

## 2015-07-28 DIAGNOSIS — F319 Bipolar disorder, unspecified: Secondary | ICD-10-CM | POA: Diagnosis not present

## 2015-07-28 DIAGNOSIS — K5731 Diverticulosis of large intestine without perforation or abscess with bleeding: Secondary | ICD-10-CM | POA: Diagnosis not present

## 2015-07-28 DIAGNOSIS — K922 Gastrointestinal hemorrhage, unspecified: Secondary | ICD-10-CM | POA: Diagnosis not present

## 2015-07-28 HISTORY — PX: ENTEROSCOPY: SHX5533

## 2015-07-28 LAB — CBC
HCT: 31.5 % — ABNORMAL LOW (ref 39.0–52.0)
HEMOGLOBIN: 10.4 g/dL — AB (ref 13.0–17.0)
MCH: 28.7 pg (ref 26.0–34.0)
MCHC: 33 g/dL (ref 30.0–36.0)
MCV: 87 fL (ref 78.0–100.0)
Platelets: 149 10*3/uL — ABNORMAL LOW (ref 150–400)
RBC: 3.62 MIL/uL — AB (ref 4.22–5.81)
RDW: 15.1 % (ref 11.5–15.5)
WBC: 5.9 10*3/uL (ref 4.0–10.5)

## 2015-07-28 SURGERY — ENTEROSCOPY
Anesthesia: Moderate Sedation

## 2015-07-28 MED ORDER — FENTANYL CITRATE (PF) 100 MCG/2ML IJ SOLN
INTRAMUSCULAR | Status: DC | PRN
  Administered 2015-07-28 (×2): 25 ug via INTRAVENOUS

## 2015-07-28 MED ORDER — DIPHENHYDRAMINE HCL 50 MG/ML IJ SOLN: 25.0000 mg | Freq: Once | INTRAMUSCULAR | Status: DC | PRN

## 2015-07-28 MED ORDER — FENTANYL CITRATE (PF) 100 MCG/2ML IJ SOLN
INTRAMUSCULAR | Status: AC
  Filled 2015-07-28: qty 2

## 2015-07-28 MED ORDER — BUTAMBEN-TETRACAINE-BENZOCAINE 2-2-14 % EX AERO
INHALATION_SPRAY | CUTANEOUS | Status: DC | PRN
  Administered 2015-07-28: 1 via TOPICAL

## 2015-07-28 MED ORDER — MIDAZOLAM HCL 10 MG/2ML IJ SOLN
INTRAMUSCULAR | Status: DC | PRN
  Administered 2015-07-28: 2 mg via INTRAVENOUS
  Administered 2015-07-28: 1 mg via INTRAVENOUS
  Administered 2015-07-28: 2 mg via INTRAVENOUS

## 2015-07-28 MED ORDER — SODIUM CHLORIDE 0.9 % IV SOLN: INTRAVENOUS | Status: DC

## 2015-07-28 MED ORDER — DIPHENHYDRAMINE HCL 50 MG/ML IJ SOLN
INTRAMUSCULAR | Status: AC
  Filled 2015-07-28: qty 1

## 2015-07-28 MED ORDER — MIDAZOLAM HCL 5 MG/ML IJ SOLN
INTRAMUSCULAR | Status: AC
  Filled 2015-07-28: qty 2

## 2015-07-28 NOTE — H&P (View-Only) (Signed)
Daily Rounding Note  07/28/2015, 8:24 AM  LOS: 2 days   SUBJECTIVE:       3 burgundy stools with clots in afternoon to evening yesterday.  None so far today. He fells well.  No complaints.   OBJECTIVE:         Vital signs in last 24 hours:    Temp:  [97.7 F (36.5 C)-99.4 F (37.4 C)] 98.1 F (36.7 C) (12/08 0617) Pulse Rate:  [55-99] 66 (12/08 0617) Resp:  [17-20] 18 (12/08 0617) BP: (127-149)/(57-83) 149/74 mmHg (12/08 0617) SpO2:  [93 %-97 %] 97 % (12/08 0617) Weight:  [79.7 kg (175 lb 11.3 oz)] 79.7 kg (175 lb 11.3 oz) (12/07 2124) Last BM Date: 07/27/15 Filed Weights   07/26/15 0144 07/26/15 2047 07/27/15 2124  Weight: 102.286 kg (225 lb 8 oz) 104.101 kg (229 lb 8 oz) 79.7 kg (175 lb 11.3 oz)   General: pleasant, NAD.  Comfortable.  Sleeping well   Heart: RRR Chest: clear bil.  No labored resps or cough Abdomen: soft, obese, active BS, NT  Extremities: no CCE Neuro/Psych:  Pleasant, awakened from sound sleep.  Oriented x 3.    Intake/Output from previous day: 12/07 0701 - 12/08 0700 In: 2929.2 [P.O.:1720; I.V.:1209.2] Out: 0   Intake/Output this shift:    Lab Results:  Recent Labs  07/27/15 0610 07/27/15 1811 07/28/15 0534  WBC 6.5 5.6 5.9  HGB 10.9* 11.5* 10.4*  HCT 34.5* 35.8* 31.5*  PLT 145* 149* 149*   BMET  Recent Labs  07/25/15 2033 07/26/15 0358 07/27/15 0610  NA 138 138 137  K 3.8 4.1 4.2  CL 104 105 104  CO2 26 25 23   GLUCOSE 133* 140* 98  BUN 11 12 8   CREATININE 1.04 0.96 0.92  CALCIUM 9.9 9.3 8.6*   LFT  Recent Labs  07/25/15 2033 07/26/15 0358  PROT 7.8 7.1  ALBUMIN 3.8 3.3*  AST 12* 14*  ALT 14* 14*  ALKPHOS 52 44  BILITOT 0.6 0.4   PT/INR  Recent Labs  07/26/15 0358  LABPROT 14.9  INR 1.16   Hepatitis Panel No results for input(s): HEPBSAG, HCVAB, HEPAIGM, HEPBIGM in the last 72 hours.  Studies/Results: Nm Gi Blood Loss  07/27/2015  CLINICAL  DATA:  GI bleeding.  Bloody stool this morning. EXAM: NUCLEAR MEDICINE GASTROINTESTINAL BLEEDING SCAN TECHNIQUE: Sequential abdominal images were obtained following intravenous administration of Tc-78m labeled red blood cells. RADIOPHARMACEUTICALS:  26.0 MCi Tc-16m in-vitro labeled red cells. COMPARISON:  10/26/2014 FINDINGS: No specific findings are identified to suggest active lower gastrointestinal tract bleeding. There is ill-defined radiotracer activity within the left upper quadrant of the abdomen which appears to conform to the distribution of the small bowel loops. IMPRESSION: 1. No evidence for active lower GI bleed. 2. Increased radiotracer uptake within the left upper quadrant of the abdomen appears to move but does not conform to the expected distribution of the colon and is felt a likely represent an upper GI bleed. Electronically Signed   By: Kerby Moors M.D.   On: 07/27/2015 16:56   Scheduled Meds: . sodium chloride   Intravenous Once  . divalproex  1,000 mg Oral QHS  . metoprolol tartrate  12.5 mg Oral BID  . pantoprazole (PROTONIX) IV  40 mg Intravenous Q12H  . risperidone  4 mg Oral BID  . sodium chloride  3 mL Intravenous Q12H   Continuous Infusions: . 0.9 % NaCl with KCl 20  mEq / L 50 mL/hr at 07/27/15 2129   PRN Meds:.diphenhydrAMINE   ASSESMENT:   *  LGIB.  Presumed diverticular.  However Nuc scan suggest it could be upper GIB.  MD added IV BID PPI last night.    *  Chronic thrombocytopenia.  *   ABL anemia.  No transfusion thus far.   PLAN   *  Asked pt to hold off on breakfast tray.  Dr Fuller Plan will perform EGD/possible enteroscopy later this AM.  Pt agreeable.     Azucena Freed  07/28/2015, 8:24 AM Pager: [phone]     Attending physician's note   I have taken an interval history, reviewed the chart and examined the patient. I agree with the Advanced Practitioner's note, impression and recommendations. Recurrent bleeding yesterday with maroon stool and  clots. Nuc med scan was not definitive but could be UGI tract source of bleeding. Proceed with EGD, possible enteroscopy today.   Lucio Edward, MD [facility] [phone] Mon-Fri 8a-5p [phone] after 5p, weekends, holidays

## 2015-07-28 NOTE — Progress Notes (Signed)
[facility] PROGRESS NOTE  Bradley [name] [id number] DOB: 1950/12/14 DOA: 07/25/2015 PCP: Benito Mccreedy, MD  Assessment/Plan: 1. Acute diverticular bleed: - persistent and nuclear scan ordered. Showed some uptake in the left upper quadrant.  - hemoglobin is stable. Started him on IV protonix for possible upper GI ulcer or upper GI bleed ON 12/8,  - gi consulted and recommendations given.  - underwent upper GI series, no active bleeding seen.  - advance diet , monitor hemoglobin and possible d/c in am if no bleeding.    Bipolar disorder: Resume home medications.   Code Status: full code.  Family Communication: none at bedside.  Disposition Plan: pending further work up. Possibly home in am. .    Consultants:  Gastroenterology.  Procedures:  NM blood scan.  Upper Gi series.   Antibiotics:  none  HPI/Subjective: No rectal bleed today.   Objective: Filed Vitals:   07/28/15 1300 07/28/15 1501  BP: 163/91 147/61  Pulse: 58 51  Temp:  97.8 F (36.6 C)  Resp: 23 21    Intake/Output Summary (Last 24 hours) at 07/28/15 1841 Last data filed at 07/28/15 1800  Gross per 24 hour  Intake   3070 ml  Output      0 ml  Net   3070 ml   Filed Weights   07/26/15 2047 07/27/15 2124 07/28/15 1118  Weight: 104.101 kg (229 lb 8 oz) 79.7 kg (175 lb 11.3 oz) 79.379 kg (175 lb)    Exam:   General:  Alert comfortable.   Cardiovascular: s1s2  Respiratory: ctab  Abdomen: soft NT nd bs+  Musculoskeletal: no pedal edema.   Data Reviewed: Basic Metabolic Panel:  Recent Labs Lab 07/25/15 2033 07/26/15 0358 07/27/15 0610  NA 138 138 137  K 3.8 4.1 4.2  CL 104 105 104  CO2 26 25 23   GLUCOSE 133* 140* 98  BUN 11 12 8   CREATININE 1.04 0.96 0.92  CALCIUM 9.9 9.3 8.6*   Liver Function Tests:  Recent Labs Lab 07/25/15 2033 07/26/15 0358  AST 12* 14*  ALT 14* 14*  ALKPHOS 52 44  BILITOT 0.6 0.4  PROT 7.8 7.1  ALBUMIN 3.8 3.3*   No results  for input(s): LIPASE, AMYLASE in the last 168 hours. No results for input(s): AMMONIA in the last 168 hours. CBC:  Recent Labs Lab 07/26/15 0358 07/26/15 0900 07/26/15 1824 07/27/15 0610 07/27/15 1811 07/28/15 0534  WBC 7.0  --  6.1 6.5 5.6 5.9  NEUTROABS 2.6  --   --   --   --   --   HGB 12.0* 11.4* 11.0* 10.9* 11.5* 10.4*  HCT 37.5* 36.0* 34.6* 34.5* 35.8* 31.5*  MCV 87.4  --  87.4 88.0 87.3 87.0  PLT 176  --  156 145* 149* 149*   Cardiac Enzymes: No results for input(s): CKTOTAL, CKMB, CKMBINDEX, TROPONINI in the last 168 hours. BNP (last 3 results)  Recent Labs  02/25/15 2320  BNP 7.2    ProBNP (last 3 results) No results for input(s): PROBNP in the last 8760 hours.  CBG: No results for input(s): GLUCAP in the last 168 hours.  No results found for this or any previous visit (from the past 240 hour(s)).   Studies: Nm Gi Blood Loss  07/27/2015  CLINICAL DATA:  GI bleeding.  Bloody stool this morning. EXAM: NUCLEAR MEDICINE GASTROINTESTINAL BLEEDING SCAN TECHNIQUE: Sequential abdominal images were obtained following intravenous administration of Tc-73m labeled red blood cells. RADIOPHARMACEUTICALS:  26.0 MCi Tc-22m in-vitro labeled red  cells. COMPARISON:  10/26/2014 FINDINGS: No specific findings are identified to suggest active lower gastrointestinal tract bleeding. There is ill-defined radiotracer activity within the left upper quadrant of the abdomen which appears to conform to the distribution of the small bowel loops. IMPRESSION: 1. No evidence for active lower GI bleed. 2. Increased radiotracer uptake within the left upper quadrant of the abdomen appears to move but does not conform to the expected distribution of the colon and is felt a likely represent an upper GI bleed. Electronically Signed   By: Kerby Moors M.D.   On: 07/27/2015 16:56    Scheduled Meds: . sodium chloride   Intravenous Once  . divalproex  1,000 mg Oral QHS  . metoprolol tartrate  12.5 mg  Oral BID  . pantoprazole (PROTONIX) IV  40 mg Intravenous Q12H  . risperidone  4 mg Oral BID  . sodium chloride  3 mL Intravenous Q12H   Continuous Infusions: . 0.9 % NaCl with KCl 20 mEq / L 50 mL/hr at 07/27/15 2129    Active Problems:   Lower GI bleed   Acute lower GI bleeding   Bipolar I disorder (HCC)   HTN (hypertension), benign   Hematochezia    Time spent: 25 min    [facility] Pager [phone]. If 7PM-7AM, please contact night-coverage at [email or web address], password [facility] 07/28/2015, 6:41 PM  LOS: 2 days

## 2015-07-28 NOTE — Op Note (Signed)
[facility] [place], [address]   ENTEROSCOPY PROCEDURE REPORT     EXAM DATE: 07/28/2015  PATIENT NAME:      Bradley [name], Bradley [name]           MR #:      [id number]  BIRTHDATE:       12/07/1950      VISIT #:     [id number]  ATTENDING:     Ladene Artist, MD, [facility]     STATUS:     outpatient  ASSISTANT:      Sharon Mt and Kappus, Retta Mac MD: [facility] ASA CLASS:        Class II INDICATIONS:  The patient is a 64 yr old male here for an enteroscopy procedure due to hematocheiza and nuclear medicine scan suggesting proximal small bowel source of bleeding. PROCEDURE PERFORMED:     Diagnostic small bowel enteroscopy MEDICATIONS:     Fentanyl 50 mcg IV and Versed 5 mg IV  CONSENT: The patient understands the risks and benefits of the procedure and understands that these risks include, but are not limited to: sedation, allergic reaction, infection, perforation and/or bleeding. Alternative means of evaluation and treatment include, among others: physical exam, x-rays, and/or surgical intervention. The patient elects to proceed with this endoscopic procedure. DESCRIPTION OF PROCEDURE: During intra-op preparation period all mechanical & medical equipment was checked for proper function. Hand hygiene and appropriate measures for infection prevention was taken. After the risks, benefits and alternatives of the procedure were thoroughly explained, Informed consent was verified, confirmed and timeout was successfully executed by the treatment team. The    endoscope was introduced through the mouth and advanced to the proximal jejunum. The prep was The overall prep quality was excellent.. The instrument was then slowly withdrawn while examining the mucosa circumferentially. The scope was then completely withdrawn from the patient and the procedure terminated. The pulse, BP, and O2 saturation were monitored and documented by  the physician and the nursing staff throughout the entire procedure.  The patient was cared for as planned according to standard protocol, then discharged to recovery in stable condition and with appropriate post procedure care. Estimated blood loss is zero unless otherwise noted in this procedure report.  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   The stomach otherwise appeared normal. ESOPHAGUS: The mucosa of the esophagus appeared normal. DUODENUM: The duodenal mucosa showed no abnormalities in the entire duodenum. JEJUNUM: The exam showed no abnormalities in the proximal jejunum.   ADVERSE EVENTS:      There were no immediate complications.  IMPRESSIONS:     1.  Pancreatic rest in antrum 2.  Small hiatal hernia 3.  The enteroscopy otherwise appeared normal  RECOMMENDATIONS:     Monitor for bleeding and monitor Hb/Hct   Ladene Artist, MD, [facility] eSigned:  Ladene Artist, MD, [facility] 07/28/2015 12:35 PM       PATIENT NAME:  Bradley, [name] MR#: [id number]

## 2015-07-28 NOTE — Interval H&P Note (Signed)
History and Physical Interval Note:  07/28/2015 11:57 AM  Bradley [name]  has presented today for surgery, with the diagnosis of gi bleed, anemia, RBC scan + for UGI source  The various methods of treatment have been discussed with the patient and family. After consideration of risks, benefits and other options for treatment, the patient has consented to  Procedure(s): ENTEROSCOPY (N/A) as a surgical intervention .  The patient's history has been reviewed, patient examined, no change in status, stable for surgery.  I have reviewed the patient's chart and labs.  Questions were answered to the patient's satisfaction.     Pricilla Riffle. Fuller Plan

## 2015-07-28 NOTE — Progress Notes (Signed)
Daily Rounding Note  07/28/2015, 8:24 AM  LOS: 2 days   SUBJECTIVE:       3 burgundy stools with clots in afternoon to evening yesterday.  None so far today. He fells well.  No complaints.   OBJECTIVE:         Vital signs in last 24 hours:    Temp:  [97.7 F (36.5 C)-99.4 F (37.4 C)] 98.1 F (36.7 C) (12/08 0617) Pulse Rate:  [55-99] 66 (12/08 0617) Resp:  [17-20] 18 (12/08 0617) BP: (127-149)/(57-83) 149/74 mmHg (12/08 0617) SpO2:  [93 %-97 %] 97 % (12/08 0617) Weight:  [79.7 kg (175 lb 11.3 oz)] 79.7 kg (175 lb 11.3 oz) (12/07 2124) Last BM Date: 07/27/15 Filed Weights   07/26/15 0144 07/26/15 2047 07/27/15 2124  Weight: 102.286 kg (225 lb 8 oz) 104.101 kg (229 lb 8 oz) 79.7 kg (175 lb 11.3 oz)   General: pleasant, NAD.  Comfortable.  Sleeping well   Heart: RRR Chest: clear bil.  No labored resps or cough Abdomen: soft, obese, active BS, NT  Extremities: no CCE Neuro/Psych:  Pleasant, awakened from sound sleep.  Oriented x 3.    Intake/Output from previous day: 12/07 0701 - 12/08 0700 In: 2929.2 [P.O.:1720; I.V.:1209.2] Out: 0   Intake/Output this shift:    Lab Results:  Recent Labs  07/27/15 0610 07/27/15 1811 07/28/15 0534  WBC 6.5 5.6 5.9  HGB 10.9* 11.5* 10.4*  HCT 34.5* 35.8* 31.5*  PLT 145* 149* 149*   BMET  Recent Labs  07/25/15 2033 07/26/15 0358 07/27/15 0610  NA 138 138 137  K 3.8 4.1 4.2  CL 104 105 104  CO2 26 25 23   GLUCOSE 133* 140* 98  BUN 11 12 8   CREATININE 1.04 0.96 0.92  CALCIUM 9.9 9.3 8.6*   LFT  Recent Labs  07/25/15 2033 07/26/15 0358  PROT 7.8 7.1  ALBUMIN 3.8 3.3*  AST 12* 14*  ALT 14* 14*  ALKPHOS 52 44  BILITOT 0.6 0.4   PT/INR  Recent Labs  07/26/15 0358  LABPROT 14.9  INR 1.16   Hepatitis Panel No results for input(s): HEPBSAG, HCVAB, HEPAIGM, HEPBIGM in the last 72 hours.  Studies/Results: Nm Gi Blood Loss  07/27/2015  CLINICAL  DATA:  GI bleeding.  Bloody stool this morning. EXAM: NUCLEAR MEDICINE GASTROINTESTINAL BLEEDING SCAN TECHNIQUE: Sequential abdominal images were obtained following intravenous administration of Tc-28m labeled red blood cells. RADIOPHARMACEUTICALS:  26.0 MCi Tc-80m in-vitro labeled red cells. COMPARISON:  10/26/2014 FINDINGS: No specific findings are identified to suggest active lower gastrointestinal tract bleeding. There is ill-defined radiotracer activity within the left upper quadrant of the abdomen which appears to conform to the distribution of the small bowel loops. IMPRESSION: 1. No evidence for active lower GI bleed. 2. Increased radiotracer uptake within the left upper quadrant of the abdomen appears to move but does not conform to the expected distribution of the colon and is felt a likely represent an upper GI bleed. Electronically Signed   By: Kerby Moors M.D.   On: 07/27/2015 16:56   Scheduled Meds: . sodium chloride   Intravenous Once  . divalproex  1,000 mg Oral QHS  . metoprolol tartrate  12.5 mg Oral BID  . pantoprazole (PROTONIX) IV  40 mg Intravenous Q12H  . risperidone  4 mg Oral BID  . sodium chloride  3 mL Intravenous Q12H   Continuous Infusions: . 0.9 % NaCl with KCl 20  mEq / L 50 mL/hr at 07/27/15 2129   PRN Meds:.diphenhydrAMINE   ASSESMENT:   *  LGIB.  Presumed diverticular.  However Nuc scan suggest it could be upper GIB.  MD added IV BID PPI last night.    *  Chronic thrombocytopenia.  *   ABL anemia.  No transfusion thus far.   PLAN   *  Asked pt to hold off on breakfast tray.  Dr Fuller Plan will perform EGD/possible enteroscopy later this AM.  Pt agreeable.     Azucena Freed  07/28/2015, 8:24 AM Pager: [phone]     Attending physician's note   I have taken an interval history, reviewed the chart and examined the patient. I agree with the Advanced Practitioner's note, impression and recommendations. Recurrent bleeding yesterday with maroon stool and  clots. Nuc med scan was not definitive but could be UGI tract source of bleeding. Proceed with EGD, possible enteroscopy today.   Lucio Edward, MD [facility] [phone] Mon-Fri 8a-5p [phone] after 5p, weekends, holidays

## 2015-07-29 ENCOUNTER — Encounter (HOSPITAL_COMMUNITY): Payer: Self-pay | Admitting: Gastroenterology

## 2015-07-29 DIAGNOSIS — K5731 Diverticulosis of large intestine without perforation or abscess with bleeding: Secondary | ICD-10-CM | POA: Diagnosis not present

## 2015-07-29 DIAGNOSIS — K921 Melena: Secondary | ICD-10-CM | POA: Diagnosis not present

## 2015-07-29 DIAGNOSIS — K922 Gastrointestinal hemorrhage, unspecified: Secondary | ICD-10-CM | POA: Diagnosis not present

## 2015-07-29 LAB — TYPE AND SCREEN
ABO/RH(D): O POS
Antibody Screen: NEGATIVE
UNIT DIVISION: 0
UNIT DIVISION: 0
Unit division: 0
Unit division: 0

## 2015-07-29 LAB — CBC
HCT: 29.8 % — ABNORMAL LOW (ref 39.0–52.0)
Hemoglobin: 9.9 g/dL — ABNORMAL LOW (ref 13.0–17.0)
MCH: 29.1 pg (ref 26.0–34.0)
MCHC: 33.2 g/dL (ref 30.0–36.0)
MCV: 87.6 fL (ref 78.0–100.0)
PLATELETS: 146 10*3/uL — AB (ref 150–400)
RBC: 3.4 MIL/uL — ABNORMAL LOW (ref 4.22–5.81)
RDW: 15.5 % (ref 11.5–15.5)
WBC: 5.9 10*3/uL (ref 4.0–10.5)

## 2015-07-29 NOTE — Progress Notes (Signed)
Patient discharge teaching given, including activity, diet, follow-up appoints, and medications. Patient verbalized understanding of all discharge instructions. IV access was d/c'd. Vitals are stable. Skin is intact except as charted in most recent assessments. Pt to be escorted out by NT, to be driven home by family.  King Pinzon, MBA, BS, RN 

## 2015-07-29 NOTE — Progress Notes (Signed)
Daily Rounding Note  07/29/2015, 9:19 AM  LOS: 3 days   SUBJECTIVE:       No stool yest or today.  Feels well, eating well.  Walking without issues  OBJECTIVE:         Vital signs in last 24 hours:    Temp:  [97.8 F (36.6 C)-98.7 F (37.1 C)] 97.8 F (36.6 C) (12/09 0502) Pulse Rate:  [28-76] 66 (12/09 0502) Resp:  [18-28] 18 (12/09 0502) BP: (121-216)/(61-155) 131/98 mmHg (12/09 0502) SpO2:  [88 %-98 %] 96 % (12/09 0502) Weight:  [79.379 kg (175 lb)] 79.379 kg (175 lb) (12/08 1118) Last BM Date: 07/27/15 Filed Weights   07/26/15 2047 07/27/15 2124 07/28/15 1118  Weight: 104.101 kg (229 lb 8 oz) 79.7 kg (175 lb 11.3 oz) 79.379 kg (175 lb)   General: looks well.    Heart: RRR Chest: clear bil.  No dyspnea Abdomen: soft, NT, active BS Extremities: no CCE Neuro/Psych:  Pleasant, calm, fully alert and oriented.  No gross deficits or tremor.   Intake/Output from previous day: 12/08 0701 - 12/09 0700 In: 2220 [P.O.:990; I.V.:1230] Out: 0   Intake/Output this shift:    Lab Results:  Recent Labs  07/27/15 1811 07/28/15 0534 07/29/15 0330  WBC 5.6 5.9 5.9  HGB 11.5* 10.4* 9.9*  HCT 35.8* 31.5* 29.8*  PLT 149* 149* 146*   BMET  Recent Labs  07/27/15 0610  NA 137  K 4.2  CL 104  CO2 23  GLUCOSE 98  BUN 8  CREATININE 0.92  CALCIUM 8.6*   LFT No results for input(s): PROT, ALBUMIN, AST, ALT, ALKPHOS, BILITOT, BILIDIR, IBILI in the last 72 hours. PT/INR No results for input(s): LABPROT, INR in the last 72 hours. Hepatitis Panel No results for input(s): HEPBSAG, HCVAB, HEPAIGM, HEPBIGM in the last 72 hours.  Studies/Results: Nm Gi Blood Loss  07/27/2015  CLINICAL DATA:  GI bleeding.  Bloody stool this morning. EXAM: NUCLEAR MEDICINE GASTROINTESTINAL BLEEDING SCAN TECHNIQUE: Sequential abdominal images were obtained following intravenous administration of Tc-69m labeled red blood cells.  RADIOPHARMACEUTICALS:  26.0 MCi Tc-24m in-vitro labeled red cells. COMPARISON:  10/26/2014 FINDINGS: No specific findings are identified to suggest active lower gastrointestinal tract bleeding. There is ill-defined radiotracer activity within the left upper quadrant of the abdomen which appears to conform to the distribution of the small bowel loops. IMPRESSION: 1. No evidence for active lower GI bleed. 2. Increased radiotracer uptake within the left upper quadrant of the abdomen appears to move but does not conform to the expected distribution of the colon and is felt a likely represent an upper GI bleed. Electronically Signed   By: Kerby Moors M.D.   On: 07/27/2015 16:56    ASSESMENT:   *  GIB.  Hematochezia.  Diverticular source likely.  RBC scan suspicious for UGIB.  12/8 enteroscopy to prox jejunum negative.   *  ABL anemia.   No transfusions to date.    PLAN   *  Stopped PPI. Not on at home and no UGI sxs. .   *  Can discharge home today.  Should have CBC within next 7 to 10 days to assure it is headed up.  this can be performed by PMD Osei-Bonsu.  Advised pt to return to ED if recurrent large volume bleeding, if presyncopal/syncopal, tachycardic, having chest pain If recurrent bleeding should go directly to nuc med RBC scan and, if +, angiogram/embolization.   *  GI follow up prn, on follow up schedule for colonoscopy/polyp surveillance with Dr Scarlette Shorts in 12/2018   Bradley [name]  07/29/2015, 9:19 AM Pager: [phone]    Attending physician's note   I have taken an interval history, reviewed the chart and examined the patient. I agree with the Advanced Practitioner's note, impression and recommendations.   Lucio Edward, MD [facility] [phone] Mon-Fri 8a-5p [phone] after 5p, weekends, holidays

## 2015-08-01 NOTE — Discharge Summary (Signed)
Physician Discharge Summary  Bradley [name] [id number] DOB: Mar 31, 1951 DOA: 07/25/2015  PCP: Benito Mccreedy, MD  Admit date: 07/25/2015 Discharge date: 07/29/2015  Time spent: 25 minutes  Recommendations for Outpatient Follow-up:  1. Follow up with PCP in one week.  2. Follow up with GI as recommended.    Discharge Diagnoses:  Active Problems:   Lower GI bleed   Acute lower GI bleeding   Bipolar I disorder (HCC)   HTN (hypertension), benign   Hematochezia   Discharge Condition: improved  Diet recommendation: regular  Filed Weights   07/26/15 2047 07/27/15 2124 07/28/15 1118  Weight: 104.101 kg (229 lb 8 oz) 79.7 kg (175 lb 11.3 oz) 79.379 kg (175 lb)    History of present illness:  Bradley [name] is a 64 y.o. male with PMH of bipolar disorder, hypertension, hyperlipidemia, diet-controlled diabetes, and diverticulosis who presents following an episode of bright red blood per rectum at home  Hospital Course:  1. Acute diverticular bleed: - persistent and nuclear scan ordered. Showed some uptake in the left upper quadrant.  - hemoglobin is stable. Started him on IV protonix for possible upper GI ulcer or upper GI bleed ON 12/8,  - gi consulted and recommendations given.  - underwent upper GI series, no active bleeding seen.  - advanced diet , hemoglobin stable on discharge.    Bipolar disorder: Resume home medications.  Procedures:  Upper GI series.  Consultations:  gastroenterology  Discharge Exam: Filed Vitals:   07/29/15 0502 07/29/15 0956  BP: 131/98 140/79  Pulse: 66 57  Temp: 97.8 F (36.6 C) 98.9 F (37.2 C)  Resp: 18 20    General: alert afebrile comfortable Cardiovascular: s1s2 Respiratory: ctab  Discharge Instructions   Discharge Instructions    Diet - low sodium heart healthy    Complete by:  As directed      Discharge instructions    Complete by:  As directed   Follow up with PCP in one week.  Please obtain CBC within  next 7 to 10 days to make sure your hemoglobin is stable. This can be performed by PMD Osei-Bonsu. Advised pt to return to ED if recurrent large volume bleeding, if presyncopal/syncopal( dizziness, and feel like passing out, fast heart rate, or having chest pain.  If recurrent bleeding should go directly to nuc med RBC scan and, if +, angiogram/embolization          Discharge Medication List as of 07/29/2015 12:22 PM    CONTINUE these medications which have NOT CHANGED   Details  amLODipine (NORVASC) 10 MG tablet Take 10 mg by mouth daily., Until Discontinued, Historical Med    divalproex (DEPAKOTE ER) 500 MG 24 hr tablet Take 1,000 mg by mouth at bedtime., Until Discontinued, Historical Med    lisinopril (PRINIVIL,ZESTRIL) 40 MG tablet Take 40 mg by mouth daily., Until Discontinued, Historical Med    metoprolol succinate (TOPROL-XL) 25 MG 24 hr tablet Take 25 mg by mouth daily., Starting 06/27/2015, Until Discontinued, Historical Med    pravastatin (PRAVACHOL) 80 MG tablet Take 80 mg by mouth every evening. , Until Discontinued, Historical Med    risperidone (RISPERDAL) 4 MG tablet Take 4 mg by mouth 2 (two) times daily., Until Discontinued, Historical Med      STOP taking these medications     predniSONE (DELTASONE) 50 MG tablet        No Known Allergies Follow-up Information    Follow up with OSEI-BONSU,GEORGE, MD. Schedule an appointment as soon as possible  for a visit in 1 week.   Specialty:  Internal Medicine   Why:  please obtain CBC at the office visit to make sure hemoglobin is stable. Appointment Date: 08/05/15 W@2 :30p. Please arrive at least 15 minutes early. Thank you.    Contact information:   [id number] [place] [address] [phone]        The results of significant diagnostics from this hospitalization (including imaging, microbiology, ancillary and laboratory) are listed below for reference.    Significant Diagnostic Studies: Nm Gi  Blood Loss  07/27/2015  CLINICAL DATA:  GI bleeding.  Bloody stool this morning. EXAM: NUCLEAR MEDICINE GASTROINTESTINAL BLEEDING SCAN TECHNIQUE: Sequential abdominal images were obtained following intravenous administration of Tc-30m labeled red blood cells. RADIOPHARMACEUTICALS:  26.0 MCi Tc-12m in-vitro labeled red cells. COMPARISON:  10/26/2014 FINDINGS: No specific findings are identified to suggest active lower gastrointestinal tract bleeding. There is ill-defined radiotracer activity within the left upper quadrant of the abdomen which appears to conform to the distribution of the small bowel loops. IMPRESSION: 1. No evidence for active lower GI bleed. 2. Increased radiotracer uptake within the left upper quadrant of the abdomen appears to move but does not conform to the expected distribution of the colon and is felt a likely represent an upper GI bleed. Electronically Signed   By: Kerby Moors M.D.   On: 07/27/2015 16:56    Microbiology: No results found for this or any previous visit (from the past 240 hour(s)).   Labs: Basic Metabolic Panel:  Recent Labs Lab 07/25/15 2033 07/26/15 0358 07/27/15 0610  NA 138 138 137  K 3.8 4.1 4.2  CL 104 105 104  CO2 26 25 23   GLUCOSE 133* 140* 98  BUN 11 12 8   CREATININE 1.04 0.96 0.92  CALCIUM 9.9 9.3 8.6*   Liver Function Tests:  Recent Labs Lab 07/25/15 2033 07/26/15 0358  AST 12* 14*  ALT 14* 14*  ALKPHOS 52 44  BILITOT 0.6 0.4  PROT 7.8 7.1  ALBUMIN 3.8 3.3*   No results for input(s): LIPASE, AMYLASE in the last 168 hours. No results for input(s): AMMONIA in the last 168 hours. CBC:  Recent Labs Lab 07/26/15 0358  07/26/15 1824 07/27/15 0610 07/27/15 1811 07/28/15 0534 07/29/15 0330  WBC 7.0  --  6.1 6.5 5.6 5.9 5.9  NEUTROABS 2.6  --   --   --   --   --   --   HGB 12.0*  < > 11.0* 10.9* 11.5* 10.4* 9.9*  HCT 37.5*  < > 34.6* 34.5* 35.8* 31.5* 29.8*  MCV 87.4  --  87.4 88.0 87.3 87.0 87.6  PLT 176  --  156 145*  149* 149* 146*  < > = values in this interval not displayed. Cardiac Enzymes: No results for input(s): CKTOTAL, CKMB, CKMBINDEX, TROPONINI in the last 168 hours. BNP: BNP (last 3 results)  Recent Labs  02/25/15 2320  BNP 7.2    ProBNP (last 3 results) No results for input(s): PROBNP in the last 8760 hours.  CBG: No results for input(s): GLUCAP in the last 168 hours.     SignedHosie Poisson  [facility] 08/01/2015, 9:55 AM

## 2015-08-05 DIAGNOSIS — Z8719 Personal history of other diseases of the digestive system: Secondary | ICD-10-CM | POA: Diagnosis not present

## 2015-08-05 DIAGNOSIS — I119 Hypertensive heart disease without heart failure: Secondary | ICD-10-CM | POA: Diagnosis not present

## 2015-08-05 DIAGNOSIS — E1151 Type 2 diabetes mellitus with diabetic peripheral angiopathy without gangrene: Secondary | ICD-10-CM | POA: Diagnosis not present

## 2015-08-05 DIAGNOSIS — I10 Essential (primary) hypertension: Secondary | ICD-10-CM | POA: Diagnosis not present

## 2015-08-05 DIAGNOSIS — E785 Hyperlipidemia, unspecified: Secondary | ICD-10-CM | POA: Diagnosis not present

## 2015-09-21 DIAGNOSIS — Z0000 Encounter for general adult medical examination without abnormal findings: Secondary | ICD-10-CM | POA: Diagnosis not present

## 2015-09-21 DIAGNOSIS — Z0100 Encounter for examination of eyes and vision without abnormal findings: Secondary | ICD-10-CM | POA: Diagnosis not present

## 2015-09-21 DIAGNOSIS — I119 Hypertensive heart disease without heart failure: Secondary | ICD-10-CM | POA: Diagnosis not present

## 2015-09-21 DIAGNOSIS — E1151 Type 2 diabetes mellitus with diabetic peripheral angiopathy without gangrene: Secondary | ICD-10-CM | POA: Diagnosis not present

## 2015-09-21 DIAGNOSIS — Z01118 Encounter for examination of ears and hearing with other abnormal findings: Secondary | ICD-10-CM | POA: Diagnosis not present

## 2015-09-26 DIAGNOSIS — E785 Hyperlipidemia, unspecified: Secondary | ICD-10-CM | POA: Diagnosis not present

## 2015-09-26 DIAGNOSIS — I119 Hypertensive heart disease without heart failure: Secondary | ICD-10-CM | POA: Diagnosis not present

## 2015-09-26 DIAGNOSIS — I429 Cardiomyopathy, unspecified: Secondary | ICD-10-CM | POA: Diagnosis not present

## 2015-10-13 DIAGNOSIS — I119 Hypertensive heart disease without heart failure: Secondary | ICD-10-CM | POA: Diagnosis not present

## 2015-10-13 DIAGNOSIS — I10 Essential (primary) hypertension: Secondary | ICD-10-CM | POA: Diagnosis not present

## 2015-10-13 DIAGNOSIS — E785 Hyperlipidemia, unspecified: Secondary | ICD-10-CM | POA: Diagnosis not present

## 2015-10-13 DIAGNOSIS — E1151 Type 2 diabetes mellitus with diabetic peripheral angiopathy without gangrene: Secondary | ICD-10-CM | POA: Diagnosis not present

## 2015-10-13 DIAGNOSIS — Z0000 Encounter for general adult medical examination without abnormal findings: Secondary | ICD-10-CM | POA: Diagnosis not present

## 2015-12-01 DIAGNOSIS — H2513 Age-related nuclear cataract, bilateral: Secondary | ICD-10-CM | POA: Diagnosis not present

## 2015-12-01 DIAGNOSIS — E119 Type 2 diabetes mellitus without complications: Secondary | ICD-10-CM | POA: Diagnosis not present

## 2015-12-01 DIAGNOSIS — H40013 Open angle with borderline findings, low risk, bilateral: Secondary | ICD-10-CM | POA: Diagnosis not present

## 2015-12-27 DIAGNOSIS — N5201 Erectile dysfunction due to arterial insufficiency: Secondary | ICD-10-CM | POA: Diagnosis not present

## 2015-12-27 DIAGNOSIS — Z125 Encounter for screening for malignant neoplasm of prostate: Secondary | ICD-10-CM | POA: Diagnosis not present

## 2015-12-27 DIAGNOSIS — Z0000 Encounter for general adult medical examination without abnormal findings: Secondary | ICD-10-CM | POA: Diagnosis not present

## 2016-01-01 ENCOUNTER — Emergency Department (HOSPITAL_COMMUNITY): Payer: Medicare Other

## 2016-01-01 ENCOUNTER — Encounter (HOSPITAL_COMMUNITY): Payer: Self-pay | Admitting: *Deleted

## 2016-01-01 ENCOUNTER — Emergency Department (HOSPITAL_COMMUNITY)
Admission: EM | Admit: 2016-01-01 | Discharge: 2016-01-01 | Disposition: A | Discharge status: Home / Self Care | Payer: Medicare Other | Attending: Emergency Medicine | Admitting: Emergency Medicine

## 2016-01-01 DIAGNOSIS — F3181 Bipolar II disorder: Secondary | ICD-10-CM | POA: Diagnosis not present

## 2016-01-01 DIAGNOSIS — Z8719 Personal history of other diseases of the digestive system: Secondary | ICD-10-CM | POA: Insufficient documentation

## 2016-01-01 DIAGNOSIS — R0789 Other chest pain: Secondary | ICD-10-CM | POA: Diagnosis not present

## 2016-01-01 DIAGNOSIS — Z862 Personal history of diseases of the blood and blood-forming organs and certain disorders involving the immune mechanism: Secondary | ICD-10-CM | POA: Diagnosis not present

## 2016-01-01 DIAGNOSIS — E785 Hyperlipidemia, unspecified: Secondary | ICD-10-CM | POA: Insufficient documentation

## 2016-01-01 DIAGNOSIS — E119 Type 2 diabetes mellitus without complications: Secondary | ICD-10-CM | POA: Diagnosis not present

## 2016-01-01 DIAGNOSIS — I10 Essential (primary) hypertension: Secondary | ICD-10-CM | POA: Insufficient documentation

## 2016-01-01 DIAGNOSIS — Z86018 Personal history of other benign neoplasm: Secondary | ICD-10-CM | POA: Diagnosis not present

## 2016-01-01 DIAGNOSIS — R0781 Pleurodynia: Secondary | ICD-10-CM | POA: Diagnosis not present

## 2016-01-01 DIAGNOSIS — R109 Unspecified abdominal pain: Secondary | ICD-10-CM | POA: Diagnosis present

## 2016-01-01 LAB — COMPREHENSIVE METABOLIC PANEL
ALT: 13 U/L — ABNORMAL LOW (ref 17–63)
AST: 11 U/L — ABNORMAL LOW (ref 15–41)
Albumin: 3.7 g/dL (ref 3.5–5.0)
Alkaline Phosphatase: 40 U/L (ref 38–126)
Anion gap: 11 (ref 5–15)
BUN: 6 mg/dL (ref 6–20)
CO2: 23 mmol/L (ref 22–32)
Calcium: 9.1 mg/dL (ref 8.9–10.3)
Chloride: 105 mmol/L (ref 101–111)
Creatinine, Ser: 0.97 mg/dL (ref 0.61–1.24)
GFR calc Af Amer: >60 mL/min (ref >60)
GFR calc non Af Amer: >60 mL/min (ref >60)
Glucose, Bld: 104 mg/dL — ABNORMAL HIGH (ref 65–99)
Potassium: 4.4 mmol/L (ref 3.5–5.1)
Sodium: 139 mmol/L (ref 135–145)
Total Bilirubin: 0.7 mg/dL (ref 0.3–1.2)
Total Protein: 7.5 g/dL (ref 6.5–8.1)

## 2016-01-01 LAB — CBC
HCT: 43.5 % (ref 39.0–52.0)
Hemoglobin: 14 g/dL (ref 13.0–17.0)
MCH: 28.5 pg (ref 26.0–34.0)
MCHC: 32.2 g/dL (ref 30.0–36.0)
MCV: 88.4 fL (ref 78.0–100.0)
Platelets: 122 10*3/uL — ABNORMAL LOW (ref 150–400)
RBC: 4.92 MIL/uL (ref 4.22–5.81)
RDW: 15.9 % — ABNORMAL HIGH (ref 11.5–15.5)
WBC: 5.5 10*3/uL (ref 4.0–10.5)

## 2016-01-01 LAB — LIPASE, BLOOD: Lipase: 28 U/L (ref 11–51)

## 2016-01-01 MED ORDER — DEXAMETHASONE 4 MG PO TABS
12.0000 mg | ORAL_TABLET | Freq: Once | ORAL | Status: AC
  Administered 2016-01-01: 12 mg via ORAL
  Filled 2016-01-01: qty 3

## 2016-01-01 MED ORDER — IBUPROFEN 400 MG PO TABS: 400.0000 mg | ORAL_TABLET | Freq: Four times a day (QID) | ORAL | Status: DC | PRN

## 2016-01-01 MED ORDER — OXYCODONE-ACETAMINOPHEN 5-325 MG PO TABS
1.0000 | ORAL_TABLET | Freq: Once | ORAL | Status: AC
  Administered 2016-01-01: 1 via ORAL
  Filled 2016-01-01: qty 1

## 2016-01-01 MED ORDER — ALBUTEROL SULFATE HFA 108 (90 BASE) MCG/ACT IN AERS
2.0000 | INHALATION_SPRAY | Freq: Once | RESPIRATORY_TRACT | Status: AC
  Administered 2016-01-01: 2 via RESPIRATORY_TRACT
  Filled 2016-01-01: qty 6.7

## 2016-01-01 MED ORDER — IBUPROFEN 200 MG PO TABS
400.0000 mg | ORAL_TABLET | Freq: Once | ORAL | Status: AC
  Administered 2016-01-01: 400 mg via ORAL
  Filled 2016-01-01: qty 2

## 2016-01-01 MED ORDER — OXYCODONE-ACETAMINOPHEN 5-325 MG PO TABS: 2.0000 | ORAL_TABLET | ORAL | Status: DC | PRN

## 2016-01-01 NOTE — ED Notes (Signed)
Pt verbalized understanding of d/c instructions, prescriptions, and follow-up care. No further questions/concerns, VSS, ambulatory w/ steady gait (refused wheelchair) 

## 2016-01-01 NOTE — ED Provider Notes (Signed)
Discharge instructions completed per Dr. Eugenio Hoes evaluation and treatment plan.  Charlesetta Shanks, MD 01/01/16 [phone]

## 2016-01-01 NOTE — ED Notes (Signed)
Pt is here with left lower abdominal pain and states hurts if coughs, NO NVD or constipation

## 2016-01-01 NOTE — ED Provider Notes (Signed)
CSN: [id number]     Arrival date & time 01/01/16  1018 History   First MD Initiated Contact with Patient 01/01/16 1316     Chief Complaint  Patient presents with  . Abdominal Pain     (Consider location/radiation/quality/duration/timing/severity/associated sxs/prior Treatment) HPI   65 year old male with left chest pain. Is having pain along his left costal margin. It is worse with deep inspiration, coughing and certain movements. Onset about 4 days ago. Persistent since then. He has little to no pain at rest. He does not feel short of breath. He is a smoker and has a chronic cough. He denies any acute change in this. No fevers or chills. No rash. He denies any trauma or strain. He has not tried taking anything for his symptoms.  Past Medical History  Diagnosis Date  . Hypertension   . Hyperlipemia   . Bipolar 2 disorder ([place]) 2005    Also carries diagnosis of schizophrenia.  . Hypogonadism male     Erectile dysfunction  . 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    . Diabetes mellitus without complication ([place])   . Colon adenomas 2009, 2015  . Lower GI bleed 07/2015   Past Surgical History  Procedure Laterality Date  . Colonoscopy  2009, 2015  . Enteroscopy N/A 07/28/2015    Procedure: ENTEROSCOPY;  Surgeon: Ladene Artist, MD;  Location: [facility] ENDOSCOPY;  Service: Endoscopy;  Laterality: N/A;   Family History  Problem Relation Age of Onset  . Cancer Brother     abdominal CA  . Colon cancer Neg Hx    Social History  Substance Use Topics  . Smoking status: Current Every Day Smoker -- 1.00 packs/day for 0 years    Types: Cigarettes  . Smokeless tobacco: Never Used  . Alcohol Use: No    Review of Systems   All systems reviewed and negative, other than as noted in HPI.  Allergies  Review of patient's allergies indicates no known allergies.  Home Medications   Prior to Admission medications   Medication Sig Start Date End  Date Taking? Authorizing Provider  amLODipine (NORVASC) 10 MG tablet Take 10 mg by mouth daily.    Historical Provider, MD  divalproex (DEPAKOTE ER) 500 MG 24 hr tablet Take 1,000 mg by mouth at bedtime.    Historical Provider, MD  lisinopril (PRINIVIL,ZESTRIL) 40 MG tablet Take 40 mg by mouth daily.    Historical Provider, MD  metoprolol succinate (TOPROL-XL) 25 MG 24 hr tablet Take 25 mg by mouth daily. 06/27/15   Historical Provider, MD  pravastatin (PRAVACHOL) 80 MG tablet Take 80 mg by mouth every evening.     Historical Provider, MD  risperidone (RISPERDAL) 4 MG tablet Take 4 mg by mouth 2 (two) times daily.    Historical Provider, MD   BP 136/71 mmHg  Pulse 57  Temp(Src) 98.2 F (36.8 C) (Oral)  Resp 16  Wt 243 lb 2 oz (110.281 kg)  SpO2 94% Physical Exam  Constitutional: He appears well-developed and well-nourished. No distress.  HENT:  Head: Normocephalic and atraumatic.  Eyes: Conjunctivae are normal. Right eye exhibits no discharge. Left eye exhibits no discharge.  Neck: Neck supple.  Cardiovascular: Normal rate, regular rhythm and normal heart sounds.  Exam reveals no gallop and no friction rub.   No murmur heard. Pulmonary/Chest: Effort normal. No respiratory distress. He has wheezes.    Tenderness to palpation in the pictured area. There is no crepitus.  No overlying skin changes. Faint expiratory wheezing bilaterally. No increased work of breathing. Speaking complete sentences.  Abdominal: Soft. He exhibits no distension. There is no tenderness.  Musculoskeletal: He exhibits no tenderness.  Lower extremities symmetric as compared to each other. No calf tenderness. Negative Homan's. No palpable cords.   Neurological: He is alert.  Skin: Skin is warm and dry.  Psychiatric: He has a normal mood and affect. His behavior is normal. Thought content normal.  Nursing note and vitals reviewed.   ED Course  Procedures (including critical care time) Labs Review Labs Reviewed   COMPREHENSIVE METABOLIC PANEL - Abnormal; Notable for the following:    Glucose, Bld 104 (*)    AST 11 (*)    ALT 13 (*)    All other components within normal limits  CBC - Abnormal; Notable for the following:    RDW 15.9 (*)    Platelets 122 (*)    All other components within normal limits  LIPASE, BLOOD  URINALYSIS, ROUTINE W REFLEX MICROSCOPIC (NOT AT [facility])    Imaging Review No results found.   Dg Ribs Unilateral W/chest Left  01/01/2016  CLINICAL DATA:  Pain left costal margin.  No injury EXAM: LEFT RIBS AND CHEST - 3+ VIEW COMPARISON:  72,016 FINDINGS: No fracture or other bone lesions are seen involving the ribs. There is no evidence of pneumothorax or pleural effusion. Both lungs are clear. Heart size and mediastinal contours are within normal limits. IMPRESSION: Negative. Electronically Signed   By: Franchot Gallo M.D.   On: 01/01/2016 15:28   I have personally reviewed and evaluated these images and lab results as part of my medical decision-making.   EKG Interpretation None      MDM   Final diagnoses:  Chest wall pain    65 year old male with left-sided chest pain. He is very tender along his left costal margin. She denies any trauma or strain. There is no overlying skin changes to suggest possible shingles. Imaging unremarkable. At this time, plan symptomatic treatment.    Virgel Manifold, MD 01/03/16 [phone]

## 2016-01-01 NOTE — Discharge Instructions (Signed)

## 2016-02-22 DIAGNOSIS — I119 Hypertensive heart disease without heart failure: Secondary | ICD-10-CM | POA: Diagnosis not present

## 2016-02-22 DIAGNOSIS — E785 Hyperlipidemia, unspecified: Secondary | ICD-10-CM | POA: Diagnosis not present

## 2016-02-22 DIAGNOSIS — I43 Cardiomyopathy in diseases classified elsewhere: Secondary | ICD-10-CM | POA: Diagnosis not present

## 2016-03-28 ENCOUNTER — Encounter (HOSPITAL_COMMUNITY): Payer: Self-pay | Admitting: *Deleted

## 2016-03-28 ENCOUNTER — Emergency Department (HOSPITAL_COMMUNITY)
Admission: EM | Admit: 2016-03-28 | Discharge: 2016-03-28 | Disposition: A | Discharge status: Home / Self Care | Payer: Medicare Other | Attending: Emergency Medicine | Admitting: Emergency Medicine

## 2016-03-28 ENCOUNTER — Emergency Department (HOSPITAL_COMMUNITY): Payer: Medicare Other

## 2016-03-28 DIAGNOSIS — L03211 Cellulitis of face: Secondary | ICD-10-CM

## 2016-03-28 DIAGNOSIS — Z79899 Other long term (current) drug therapy: Secondary | ICD-10-CM | POA: Diagnosis not present

## 2016-03-28 DIAGNOSIS — E119 Type 2 diabetes mellitus without complications: Secondary | ICD-10-CM | POA: Insufficient documentation

## 2016-03-28 DIAGNOSIS — F17210 Nicotine dependence, cigarettes, uncomplicated: Secondary | ICD-10-CM | POA: Diagnosis not present

## 2016-03-28 DIAGNOSIS — I10 Essential (primary) hypertension: Secondary | ICD-10-CM | POA: Diagnosis not present

## 2016-03-28 DIAGNOSIS — R51 Headache: Secondary | ICD-10-CM | POA: Diagnosis present

## 2016-03-28 DIAGNOSIS — R220 Localized swelling, mass and lump, head: Secondary | ICD-10-CM | POA: Diagnosis not present

## 2016-03-28 LAB — CBC WITH DIFFERENTIAL/PLATELET
BASOS ABS: 0 10*3/uL (ref 0.0–0.1)
BASOS PCT: 0 %
EOS ABS: 0.2 10*3/uL (ref 0.0–0.7)
EOS PCT: 3 %
HEMATOCRIT: 40.8 % (ref 39.0–52.0)
Hemoglobin: 13.8 g/dL (ref 13.0–17.0)
Lymphocytes Relative: 38 %
Lymphs Abs: 2.5 10*3/uL (ref 0.7–4.0)
MCH: 30.5 pg (ref 26.0–34.0)
MCHC: 33.8 g/dL (ref 30.0–36.0)
MCV: 90.1 fL (ref 78.0–100.0)
MONO ABS: 0.9 10*3/uL (ref 0.1–1.0)
MONOS PCT: 14 %
Neutro Abs: 3 10*3/uL (ref 1.7–7.7)
Neutrophils Relative %: 45 %
PLATELETS: 121 10*3/uL — AB (ref 150–400)
RBC: 4.53 MIL/uL (ref 4.22–5.81)
RDW: 14.4 % (ref 11.5–15.5)
WBC: 6.6 10*3/uL (ref 4.0–10.5)

## 2016-03-28 LAB — BASIC METABOLIC PANEL
ANION GAP: 5 (ref 5–15)
BUN: 12 mg/dL (ref 6–20)
CO2: 27 mmol/L (ref 22–32)
CREATININE: 0.98 mg/dL (ref 0.61–1.24)
Calcium: 8.9 mg/dL (ref 8.9–10.3)
Chloride: 104 mmol/L (ref 101–111)
GFR calc non Af Amer: >60 mL/min (ref >60)
Glucose, Bld: 96 mg/dL (ref 65–99)
Potassium: 4.6 mmol/L (ref 3.5–5.1)
SODIUM: 136 mmol/L (ref 135–145)

## 2016-03-28 MED ORDER — AMOXICILLIN-POT CLAVULANATE 875-125 MG PO TABS
1.0000 | ORAL_TABLET | Freq: Once | ORAL | Status: AC
  Administered 2016-03-28: 1 via ORAL
  Filled 2016-03-28: qty 1

## 2016-03-28 MED ORDER — IOPAMIDOL (ISOVUE-300) INJECTION 61%
INTRAVENOUS | Status: AC
  Filled 2016-03-28: qty 100

## 2016-03-28 MED ORDER — TRAMADOL HCL 50 MG PO TABS: 50.0000 mg | ORAL_TABLET | Freq: Four times a day (QID) | ORAL | 0 refills | Status: DC | PRN

## 2016-03-28 MED ORDER — AMOXICILLIN-POT CLAVULANATE 875-125 MG PO TABS: 1.0000 | ORAL_TABLET | Freq: Two times a day (BID) | ORAL | 0 refills | Status: DC

## 2016-03-28 MED ORDER — IOPAMIDOL (ISOVUE-300) INJECTION 61%
INTRAVENOUS | Status: AC
  Administered 2016-03-28: 75 mL
  Filled 2016-03-28: qty 75

## 2016-03-28 NOTE — ED Notes (Signed)
Attempted IV x 2 without success.  Have reached out to [employer]

## 2016-03-28 NOTE — ED Notes (Signed)
Due to required interventions pt acuity upgraded

## 2016-03-28 NOTE — Discharge Instructions (Signed)
Follow up with your doctor in the next couple days or return here sooner for worsening symptoms.

## 2016-03-28 NOTE — ED Triage Notes (Signed)
Pt c/o right cheek pain, worse when he opens his mouth. States it started today. Some swelling to inside of cheek noted.

## 2016-03-28 NOTE — ED Provider Notes (Signed)
[facility] DEPT Provider Note   CSN: [id number] Arrival date & time: 03/28/16  1542  First Provider Contact:  First MD Initiated Contact with Patient 03/28/16 1652     By signing my name below, I, Ephriam Jenkins, attest that this documentation has been prepared under the direction and in the presence of Debroah Baller NP  Electronically Signed: Ephriam Jenkins, ED Scribe. 03/28/16. 5:16 PM.   History   Chief Complaint Chief Complaint  Patient presents with  . Facial Pain    HPI HPI Comments: Bradley [name] is a 65 y.o. Male with a PMHx of DM, HTN, HLD, who presents to the Emergency Department complaining of pain and swelling to the right side of his face; onset today. Pt states that his right cheek is swollen. He reports that the pain is exacerbated when opening his mouth. Pt denies Hx of dental problems. Pt states that he has had cavities before. Pt denies any known insect bites to his face. Pt denies fever, chills, nausea, vomiting, tooth pain, ear pain.    The history is provided by the patient. No language interpreter was used.    Past Medical History:  Diagnosis Date  . Bipolar 2 disorder ([place]) 2005   Also carries diagnosis of schizophrenia.  . Colon adenomas 2009, 2015  . Diabetes mellitus without complication ([place])   . Hyperlipemia   . Hypertension   . Hypogonadism male    Erectile dysfunction  . Lower GI bleed 07/2015  . 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      Patient Active Problem List   Diagnosis Date Noted  . Lower GI bleed 07/26/2015  . Acute lower GI bleeding 07/26/2015  . Hematochezia 07/26/2015  . Bipolar I disorder ([place])   . HTN (hypertension), benign   . Diverticulitis of large intestine with abscess without bleeding   . Essential hypertension   . Colonic diverticular abscess   . Diverticulitis 10/13/2014  . Abscess 10/13/2014  . Erectile dysfunction 01/22/2014  . Tobacco abuse 01/22/2014  .  Thrombocytopenia, unspecified ([place]) 06/17/2013  . Hyperlipemia   . Bipolar 2 disorder [facility])     Past Surgical History:  Procedure Laterality Date  . COLONOSCOPY  2009, 2015  . ENTEROSCOPY N/A 07/28/2015   Procedure: ENTEROSCOPY;  Surgeon: Ladene Artist, MD;  Location[facility] ENDOSCOPY;  Service: Endoscopy;  Laterality: N/A;     Home Medications    Prior to Admission medications   Medication Sig Start Date End Date Taking? Authorizing Provider  amLODipine (NORVASC) 10 MG tablet Take 10 mg by mouth daily.    Historical Provider, MD  amoxicillin-clavulanate (AUGMENTIN) 875-125 MG tablet Take 1 tablet by mouth 2 (two) times daily. 03/28/16   [place], NP  divalproex (DEPAKOTE ER) 500 MG 24 hr tablet Take 1,000 mg by mouth at bedtime.    Historical Provider, MD  ibuprofen (ADVIL,MOTRIN) 400 MG tablet Take 1 tablet (400 mg total) by mouth every 6 (six) hours as needed. 01/01/16   Charlesetta Shanks, MD  lisinopril (PRINIVIL,ZESTRIL) 40 MG tablet Take 40 mg by mouth daily.    Historical Provider, MD  metoprolol succinate (TOPROL-XL) 25 MG 24 hr tablet Take 25 mg by mouth daily. 06/27/15   Historical Provider, MD  oxyCODONE-acetaminophen (PERCOCET) 5-325 MG tablet Take 2 tablets by mouth every 4 (four) hours as needed. 01/01/16   Charlesetta Shanks, MD  pravastatin (PRAVACHOL) 80 MG tablet Take 80 mg by mouth every evening.  Historical Provider, MD  risperidone (RISPERDAL) 4 MG tablet Take 4 mg by mouth 2 (two) times daily.    Historical Provider, MD  traMADol (ULTRAM) 50 MG tablet Take 1 tablet (50 mg total) by mouth every 6 (six) hours as needed. 03/28/16   Terren Haberle Bunnie Pion, NP    Family History Family History  Problem Relation Age of Onset  . Cancer Brother     abdominal CA  . Colon cancer Neg Hx     Social History Social History  Substance Use Topics  . Smoking status: Current Every Day Smoker    Packs/day: 1.00    Years: 0.00    Types: Cigarettes  . Smokeless tobacco: Never Used  . Alcohol  use No     Allergies   Review of patient's allergies indicates no known allergies.   Review of Systems Review of Systems  Constitutional: Negative for chills and fever.  HENT: Negative for dental problem and ear pain.   Gastrointestinal: Negative for nausea and vomiting.  Musculoskeletal: Positive for arthralgias (right cheek with swelling).  All other systems reviewed and are negative.    Physical Exam Updated Vital Signs BP 163/94 (BP Location: Right Arm)   Pulse 65   Temp 98.6 F (37 C)   Resp 20   SpO2 94%   Physical Exam  Constitutional: He is oriented to person, place, and time. He appears well-developed and well-nourished. No distress.  HENT:  Head: Normocephalic and atraumatic.    Right Ear: Tympanic membrane normal. No mastoid tenderness.  Left Ear: No mastoid tenderness. Decreased hearing is noted.  Right side of face with swelling and mildly tender. Mild erythema. No Mastoid tenderness. Right TM normal. Left TM occluded by cerumen.    Eyes: EOM are normal.  Neck: Normal range of motion.  Cardiovascular: Normal rate and regular rhythm.   Pulmonary/Chest: Effort normal. No respiratory distress.  Musculoskeletal: He exhibits no deformity.  Neurological: He is alert and oriented to person, place, and time.  Skin: Skin is warm and dry. He is not diaphoretic.  Psychiatric: He has a normal mood and affect.  Nursing note and vitals reviewed.    ED Treatments / Results  DIAGNOSTIC STUDIES: Oxygen Saturation is 97% on RA, normal by my interpretation.  COORDINATION OF CARE: 5:00 PM-Will flush earwax form left ear to compare to right. Discussed treatment plan with pt at bedside and pt agreed to plan.   Labs (all labs ordered are listed, but only abnormal results are displayed) Labs Reviewed  CBC WITH DIFFERENTIAL/PLATELET - Abnormal; Notable for the following:       Result Value   Platelets 121 (*)    All other components within normal limits  BASIC  METABOLIC PANEL    Radiology Ct Maxillofacial W Contrast  Result Date: 03/28/2016 CLINICAL DATA:  Right submandibular pain. Right-sided facial swelling. EXAM: CT MAXILLOFACIAL WITH CONTRAST TECHNIQUE: Multidetector CT imaging of the maxillofacial structures was performed with intravenous contrast. Multiplanar CT image reconstructions were also generated. A small metallic BB was placed on the right temple in order to reliably differentiate right from left. CONTRAST:  54mL ISOVUE-300 IOPAMIDOL (ISOVUE-300) INJECTION 61% COMPARISON:  None. FINDINGS: The submandibular glands are within normal limits bilaterally. There is no significant ductal dilation or stone. Portions of the floor of mouth are obscured by streak artifact from the dental hardware. Mild asymmetric skin thickening and subcutaneous stranding is present anteriorly on the right along the mandible. There is no deep extension. No mass lesion is  present.  The tongue base is within normal limits. Calcifications are present along the distal stylohyoid ligament bilaterally. Degenerative changes are present in the upper cervical spine. IMPRESSION: 1. Normal appearance of the submandibular gland and duct bilaterally. 2. Focal skin thickening and subcutaneous stranding anteriorly near the anterior genu of the mandible, asymmetric on the right. This raises concern for cellulitis. There is no significant deep extension. 3. No significant adenopathy or mass lesion. 4. Calcification of the distal stylohyoid ligament, or extensive right than left. 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 Electronically Signed   By: San Morelle M.D.   On: 03/28/2016 20:42    Procedures Procedures (including critical care time) Left ear irrigated with warm water and syringe. Cerumen removed and patient re examined. TM is normal.   Medications Ordered in ED Medications  iopamidol (ISOVUE-300) 61 % injection (75 mLs  Contrast Given 03/28/16 2014)  amoxicillin-clavulanate  (AUGMENTIN) 875-125 MG per tablet 1 tablet (1 tablet Oral Given 03/28/16 2140)     Initial Impression / Assessment and Plan / ED Course  I have reviewed the triage vital signs and the nursing notes.  Pertinent labs & imaging results that were available during my care of the patient were reviewed by me and considered in my medical decision making (see chart for details).  Clinical Course   Dr. Winfred Leeds in to examine the patient and CT with contrast ordered.   Final Clinical Impressions(s) / ED Diagnoses  65 y.o. male with swelling of the right side of his face that started less than 24 hours stable for d/c without abscess noted on CT. Will start antibiotics and he will f/u with his PCP in 2 days or return to the ED if symptoms worsen.  Final diagnoses:  Facial cellulitis    New Prescriptions Discharge Medication List as of 03/28/2016  9:13 PM    START taking these medications   Details  amoxicillin-clavulanate (AUGMENTIN) 875-125 MG tablet Take 1 tablet by mouth 2 (two) times daily., Starting Wed 03/28/2016, Print      I personally performed the services described in this documentation, which was scribed in my presence. The recorded information has been reviewed and is accurate.    [place] 03/29/16 2117    Orlie Dakin, MD 03/31/16 [phone]

## 2016-03-28 NOTE — ED Notes (Signed)
Patient able to ambulate independently  

## 2016-03-28 NOTE — ED Notes (Signed)
Pt's left ear cleaned out with warm tap water and hydrogen peroxide.  Able to visualize the ear drum.  NP made aware

## 2016-03-28 NOTE — ED Provider Notes (Signed)
Patient complains of right-sided pain at submandibular area. On exam gingiva appear normal. He is tender right submandibular area. No trismus. He appears in no distress. Handling secretions well   Orlie Dakin, MD 03/28/16 [phone]

## 2016-03-28 NOTE — ED Notes (Signed)
Pt transported to cT  °

## 2016-04-02 DIAGNOSIS — R972 Elevated prostate specific antigen [PSA]: Secondary | ICD-10-CM | POA: Diagnosis not present

## 2016-04-11 ENCOUNTER — Encounter (HOSPITAL_COMMUNITY): Payer: Self-pay | Admitting: Neurology

## 2016-04-11 ENCOUNTER — Emergency Department (HOSPITAL_COMMUNITY)
Admission: EM | Admit: 2016-04-11 | Discharge: 2016-04-11 | Disposition: A | Discharge status: Home / Self Care | Payer: Medicare Other | Attending: Emergency Medicine | Admitting: Emergency Medicine

## 2016-04-11 DIAGNOSIS — I10 Essential (primary) hypertension: Secondary | ICD-10-CM | POA: Insufficient documentation

## 2016-04-11 DIAGNOSIS — F17210 Nicotine dependence, cigarettes, uncomplicated: Secondary | ICD-10-CM | POA: Insufficient documentation

## 2016-04-11 DIAGNOSIS — M25511 Pain in right shoulder: Secondary | ICD-10-CM | POA: Insufficient documentation

## 2016-04-11 DIAGNOSIS — E119 Type 2 diabetes mellitus without complications: Secondary | ICD-10-CM | POA: Insufficient documentation

## 2016-04-11 MED ORDER — NAPROXEN 500 MG PO TABS: 500.0000 mg | ORAL_TABLET | Freq: Two times a day (BID) | ORAL | 0 refills | Status: DC

## 2016-04-11 NOTE — ED Triage Notes (Signed)
Pt reports right neck and shoulder pain only when lying down for 2 days. Denies injury or fall. Denies cp or sob. ROM intact of neck and shoulder when sitting.

## 2016-04-11 NOTE — ED Notes (Signed)
Pt is in stable condition upon d/c and ambulates from ED. 

## 2016-04-11 NOTE — ED Provider Notes (Signed)
[facility] DEPT Provider Note   CSN: [id number] Arrival date & time: 04/11/16  1748  By signing my name below, I, Jasmyn B. Alexander, attest that this documentation has been prepared under the direction and in the presence of Gloriann Loan, PA-C. Electronically Signed: Tedra Coupe. Sheppard Coil, ED Scribe. 04/11/16. 6:29 PM.  History   Chief Complaint Chief Complaint  Patient presents with  . Shoulder Pain  . Neck Pain    HPI HPI Comments: Bradley [name] is a 65 y.o. male with PMHx of bipolar disorder, DM, HLD, and HTN who presents to the Emergency Department complaining of gradual onset, constant, moderate, sharp, radiating right-sided neck pain to right shoulder x 2 days. He states that pain is exacerbated when he is lying on his right side. However, pain is moderately relieved when he is sitting forward. He has not taken any OTC medications PTA. Denies any injury/trauma. Patient states that pain is similar to past episodes that occurred in 2013 and he was told it was arthritic pain. He reports receiving an injection after following up with orthopedist at that time. Specialist name is unknown. Denies any numbness, weakness, chest pain, shortness of breath, or dizziness. Patient has no other complaints at this time.  The history is provided by the patient. No language interpreter was used.   Past Medical History:  Diagnosis Date  . Bipolar 2 disorder ([place]) 2005   Also carries diagnosis of schizophrenia.  . Colon adenomas 2009, 2015  . Diabetes mellitus without complication ([place])   . Hyperlipemia   . Hypertension   . Hypogonadism male    Erectile dysfunction  . Lower GI bleed 07/2015  . 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      Patient Active Problem List   Diagnosis Date Noted  . Lower GI bleed 07/26/2015  . Acute lower GI bleeding 07/26/2015  . Hematochezia 07/26/2015  . Bipolar I disorder ([place])   . HTN (hypertension), benign     . Diverticulitis of large intestine with abscess without bleeding   . Essential hypertension   . Colonic diverticular abscess   . Diverticulitis 10/13/2014  . Abscess 10/13/2014  . Erectile dysfunction 01/22/2014  . Tobacco abuse 01/22/2014  . Thrombocytopenia, unspecified ([place]) 06/17/2013  . Hyperlipemia   . Bipolar 2 disorder [facility])     Past Surgical History:  Procedure Laterality Date  . COLONOSCOPY  2009, 2015  . ENTEROSCOPY N/A 07/28/2015   Procedure: ENTEROSCOPY;  Surgeon: Ladene Artist, MD;  [facility] END[facility]COPY;  Service: Endoscopy;  Laterality: N/A;    Home Medications    Prior to Admission medications   Medication Sig Start Date End Date Taking? Authorizing Provider  amLODipine (NORVASC) 10 MG tablet Take 10 mg by mouth daily.    Historical Provider, MD  amoxicillin-clavulanate (AUGMENTIN) 875-125 MG tablet Take 1 tablet by mouth 2 (two) times daily. 03/28/16   [place], NP  divalproex (DEPAKOTE ER) 500 MG 24 hr tablet Take 1,000 mg by mouth at bedtime.    Historical Provider, MD  ibuprofen (ADVIL,MOTRIN) 400 MG tablet Take 1 tablet (400 mg total) by mouth every 6 (six) hours as needed. 01/01/16   Charlesetta Shanks, MD  lisinopril (PRINIVIL,ZESTRIL) 40 MG tablet Take 40 mg by mouth daily.    Historical Provider, MD  metoprolol succinate (TOPROL-XL) 25 MG 24 hr tablet Take 25 mg by mouth daily. 06/27/15   Historical Provider, MD  naproxen (NAPROSYN) 500 MG tablet Take 1 tablet (500  mg total) by mouth 2 (two) times daily. 04/11/16   Gloriann Loan, PA-C  oxyCODONE-acetaminophen (PERCOCET) 5-325 MG tablet Take 2 tablets by mouth every 4 (four) hours as needed. 01/01/16   Charlesetta Shanks, MD  pravastatin (PRAVACHOL) 80 MG tablet Take 80 mg by mouth every evening.     Historical Provider, MD  risperidone (RISPERDAL) 4 MG tablet Take 4 mg by mouth 2 (two) times daily.    Historical Provider, MD  traMADol (ULTRAM) 50 MG tablet Take 1 tablet (50 mg total) by mouth every 6 (six) hours as  needed. 03/28/16   [place] Bunnie Pion, NP   Family History Family History  Problem Relation Age of Onset  . Cancer Brother     abdominal CA  . Colon cancer Neg Hx     Social History Social History  Substance Use Topics  . Smoking status: Current Every Day Smoker    Packs/day: 1.00    Years: 0.00    Types: Cigarettes  . Smokeless tobacco: Never Used  . Alcohol use No    Allergies   Review of patient's allergies indicates no known allergies.   Review of Systems Review of Systems  Constitutional: Negative for fever.  Respiratory: Negative for shortness of breath.   Cardiovascular: Negative for chest pain.  Musculoskeletal: Positive for arthralgias and neck pain.  Neurological: Negative for dizziness, weakness and numbness.  All other systems reviewed and are negative.   Physical Exam Updated Vital Signs BP 151/84 (BP Location: Left Arm)   Pulse 74   Temp 98.5 F (36.9 C) (Oral)   Resp 18   SpO2 94%   Physical Exam  Constitutional: He is oriented to person, place, and time. He appears well-developed and well-nourished.  HENT:  Head: Atraumatic.  Eyes: Conjunctivae are normal.  Neck:  No cervical midline tenderness. FAROM of cervical spine.   Cardiovascular: Normal rate, regular rhythm, normal heart sounds and intact distal pulses.   Pulses:      Radial pulses are 2+ on the right side, and 2+ on the left side.  Pulmonary/Chest: Effort normal and breath sounds normal.  Abdominal: Soft. Bowel sounds are normal. He exhibits no distension. There is no tenderness.  Musculoskeletal: Normal range of motion. He exhibits no tenderness.  Right shoulder minimally TTP. FAROM in flexion, extension, abduction, and adduction.  Negative empty can test. No pain with resisted extension.  No t/l/s midline tenderness. No spinous process tenderness.  No step offs. No crepitus.  Neurological: He is alert and oriented to person, place, and time.  Strength and sensation intact bilaterally  throughout upper extremities.  Skin: Skin is warm and dry.  Psychiatric: He has a normal mood and affect. His behavior is normal.    ED Treatments / Results  DIAGNOSTIC STUDIES: Oxygen Saturation is 94% on RA, normal by my interpretation.    COORDINATION OF CARE: 6:28 PM-Discussed treatment plan which includes order of Naproxen and follow-up with Orthopedics with pt at bedside and pt agreed to plan.   Procedures Procedures (including critical care time)  Initial Impression / Assessment and Plan / ED Course  I have reviewed the triage vital signs and the nursing notes.  Pertinent labs & imaging results that were available during my care of the patient were reviewed by me and considered in my medical decision making (see chart for details).  Clinical Course   No indication for imaging at this time.  Previous imaging showed AC joint degenerative changes.  Pt advised to follow up  with orthopedics. Patient Conservative therapy recommended and discussed. Patient will be discharged home & is agreeable with above plan. Returns precautions discussed. Pt appears safe for discharge.   Final Clinical Impressions(s) / ED Diagnoses   Final diagnoses:  Shoulder pain, right    New Prescriptions New Prescriptions   NAPROXEN (NAPROSYN) 500 MG TABLET    Take 1 tablet (500 mg total) by mouth 2 (two) times daily.   I personally performed the services described in this documentation, which was scribed in my presence. The recorded information has been reviewed and is accurate.     Gloriann Loan, PA-C 04/11/16 1836    Drenda Freeze, MD 04/11/16 [phone]

## 2016-05-28 DIAGNOSIS — Z23 Encounter for immunization: Secondary | ICD-10-CM | POA: Diagnosis not present

## 2016-05-31 DIAGNOSIS — E1151 Type 2 diabetes mellitus with diabetic peripheral angiopathy without gangrene: Secondary | ICD-10-CM | POA: Diagnosis not present

## 2016-05-31 DIAGNOSIS — I119 Hypertensive heart disease without heart failure: Secondary | ICD-10-CM | POA: Diagnosis not present

## 2016-05-31 DIAGNOSIS — I10 Essential (primary) hypertension: Secondary | ICD-10-CM | POA: Diagnosis not present

## 2016-05-31 DIAGNOSIS — E785 Hyperlipidemia, unspecified: Secondary | ICD-10-CM | POA: Diagnosis not present

## 2016-05-31 DIAGNOSIS — Z720 Tobacco use: Secondary | ICD-10-CM | POA: Diagnosis not present

## 2016-07-04 DIAGNOSIS — R972 Elevated prostate specific antigen [PSA]: Secondary | ICD-10-CM | POA: Diagnosis not present

## 2016-07-09 DIAGNOSIS — R972 Elevated prostate specific antigen [PSA]: Secondary | ICD-10-CM | POA: Diagnosis not present

## 2016-07-19 DIAGNOSIS — H40013 Open angle with borderline findings, low risk, bilateral: Secondary | ICD-10-CM | POA: Diagnosis not present

## 2016-07-26 DIAGNOSIS — E1151 Type 2 diabetes mellitus with diabetic peripheral angiopathy without gangrene: Secondary | ICD-10-CM | POA: Diagnosis not present

## 2016-07-26 DIAGNOSIS — Z720 Tobacco use: Secondary | ICD-10-CM | POA: Diagnosis not present

## 2016-07-26 DIAGNOSIS — I119 Hypertensive heart disease without heart failure: Secondary | ICD-10-CM | POA: Diagnosis not present

## 2016-07-26 DIAGNOSIS — I10 Essential (primary) hypertension: Secondary | ICD-10-CM | POA: Diagnosis not present

## 2016-07-26 DIAGNOSIS — E785 Hyperlipidemia, unspecified: Secondary | ICD-10-CM | POA: Diagnosis not present

## 2016-08-21 ENCOUNTER — Encounter (HOSPITAL_COMMUNITY): Payer: Self-pay

## 2016-08-21 ENCOUNTER — Emergency Department (HOSPITAL_COMMUNITY)
Admission: EM | Admit: 2016-08-21 | Discharge: 2016-08-21 | Disposition: A | Discharge status: Home / Self Care | Payer: Medicare Other | Attending: Emergency Medicine | Admitting: Emergency Medicine

## 2016-08-21 DIAGNOSIS — Z79899 Other long term (current) drug therapy: Secondary | ICD-10-CM | POA: Insufficient documentation

## 2016-08-21 DIAGNOSIS — M2669 Other specified disorders of temporomandibular joint: Secondary | ICD-10-CM | POA: Diagnosis not present

## 2016-08-21 DIAGNOSIS — F17210 Nicotine dependence, cigarettes, uncomplicated: Secondary | ICD-10-CM | POA: Diagnosis not present

## 2016-08-21 DIAGNOSIS — E119 Type 2 diabetes mellitus without complications: Secondary | ICD-10-CM | POA: Insufficient documentation

## 2016-08-21 DIAGNOSIS — M26621 Arthralgia of right temporomandibular joint: Secondary | ICD-10-CM | POA: Insufficient documentation

## 2016-08-21 DIAGNOSIS — I10 Essential (primary) hypertension: Secondary | ICD-10-CM | POA: Diagnosis not present

## 2016-08-21 DIAGNOSIS — R6884 Jaw pain: Secondary | ICD-10-CM | POA: Diagnosis present

## 2016-08-21 DIAGNOSIS — M26629 Arthralgia of temporomandibular joint, unspecified side: Secondary | ICD-10-CM

## 2016-08-21 DIAGNOSIS — I119 Hypertensive heart disease without heart failure: Secondary | ICD-10-CM | POA: Diagnosis not present

## 2016-08-21 MED ORDER — NAPROXEN 375 MG PO TABS: 375.0000 mg | ORAL_TABLET | Freq: Two times a day (BID) | ORAL | 0 refills | Status: DC

## 2016-08-21 NOTE — ED Triage Notes (Signed)
Pt presents for evaluation of R jaw pain x 2 days. Pt denies dental pain or swelling. Pt reports painful to open mouth wide and to chew. Pt AxO x4. Denies associated symptoms or hx.

## 2016-08-21 NOTE — ED Provider Notes (Signed)
[facility] DEPT Provider Note   CSN: [id number] Arrival date & time: 08/21/16  1509  By signing my name below, I, Reola Mosher, attest that this documentation has been prepared under the direction and in the presence of Etta Quill, NP. Electronically Signed: Reola Mosher, ED Scribe. 08/21/16. 5:35 PM.  History   Chief Complaint Chief Complaint  Patient presents with  . Jaw Pain   The history is provided by the patient. No language interpreter was used.    HPI Comments: Bradley [name] is a 66 y.o. male with a PMHx DM, HTN, HLD, who presents to the Emergency Department complaining of intermittent, unchanged right sided jaw pain onset two days ago. Pt reports that his pain is mostly present and exacerbated with opening his jaw widely, and with chewing. No noted treatments were tried prior to coming into the ED. He is not currently followed by a dental specialist. He denies dental pain, facial swelling, trouble swallowing, fever, chills, nausea, vomiting, or any other associated symptoms.   Past Medical History:  Diagnosis Date  . Bipolar 2 disorder ([place]) 2005   Also carries diagnosis of schizophrenia.  . Colon adenomas 2009, 2015  . Diabetes mellitus without complication ([place])   . Hyperlipemia   . Hypertension   . Hypogonadism male    Erectile dysfunction  . Lower GI bleed 07/2015  . 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     Patient Active Problem List   Diagnosis Date Noted  . Lower GI bleed 07/26/2015  . Acute lower GI bleeding 07/26/2015  . Hematochezia 07/26/2015  . Bipolar I disorder ([place])   . HTN (hypertension), benign   . Diverticulitis of large intestine with abscess without bleeding   . Essential hypertension   . Colonic diverticular abscess   . Diverticulitis 10/13/2014  . Abscess 10/13/2014  . Erectile dysfunction 01/22/2014  . Tobacco abuse 01/22/2014  . Thrombocytopenia, unspecified 06/17/2013    . Hyperlipemia   . Bipolar 2 disorder [facility])    Past Surgical History:  Procedure Laterality Date  . COLONOSCOPY  2009, 2015  . ENTEROSCOPY N/A 07/28/2015   Procedure: ENTEROSCOPY;  Surgeon: Ladene Artist, MD;  Location: [facility] ENDOSCOPY;  Service: Endoscopy;  Laterality: N/A;    Home Medications    Prior to Admission medications   Medication Sig Start Date End Date Taking? Authorizing Provider  amLODipine (NORVASC) 10 MG tablet Take 10 mg by mouth daily.    Historical Provider, MD  amoxicillin-clavulanate (AUGMENTIN) 875-125 MG tablet Take 1 tablet by mouth 2 (two) times daily. 03/28/16   [place], NP  divalproex (DEPAKOTE ER) 500 MG 24 hr tablet Take 1,000 mg by mouth at bedtime.    Historical Provider, MD  ibuprofen (ADVIL,MOTRIN) 400 MG tablet Take 1 tablet (400 mg total) by mouth every 6 (six) hours as needed. 01/01/16   Charlesetta Shanks, MD  lisinopril (PRINIVIL,ZESTRIL) 40 MG tablet Take 40 mg by mouth daily.    Historical Provider, MD  metoprolol succinate (TOPROL-XL) 25 MG 24 hr tablet Take 25 mg by mouth daily. 06/27/15   Historical Provider, MD  naproxen (NAPROSYN) 500 MG tablet Take 1 tablet (500 mg total) by mouth 2 (two) times daily. 04/11/16   Gloriann Loan, PA-C  oxyCODONE-acetaminophen (PERCOCET) 5-325 MG tablet Take 2 tablets by mouth every 4 (four) hours as needed. 01/01/16   Charlesetta Shanks, MD  pravastatin (PRAVACHOL) 80 MG tablet Take 80 mg by mouth every evening.  Historical Provider, MD  risperidone (RISPERDAL) 4 MG tablet Take 4 mg by mouth 2 (two) times daily.    Historical Provider, MD  traMADol (ULTRAM) 50 MG tablet Take 1 tablet (50 mg total) by mouth every 6 (six) hours as needed. 03/28/16   Hope Bunnie Pion, NP   Family History Family History  Problem Relation Age of Onset  . Cancer Brother     abdominal CA  . Colon cancer Neg Hx    Social History Social History  Substance Use Topics  . Smoking status: Current Every Day Smoker    Packs/day: 1.00    Years: 0.00     Types: Cigarettes  . Smokeless tobacco: Never Used  . Alcohol use No   Allergies   Patient has no known allergies.  Review of Systems Review of Systems  Constitutional: Negative for chills and fever.  HENT: Negative for dental problem and trouble swallowing.   Gastrointestinal: Negative for nausea and vomiting.  Musculoskeletal: Positive for myalgias.  All other systems reviewed and are negative.  Physical Exam Updated Vital Signs BP 161/79 (BP Location: Right Arm)   Pulse 60   Temp 97.9 F (36.6 C) (Oral)   Resp 18   SpO2 93%   Physical Exam  Constitutional: He appears well-developed and well-nourished. No distress.  HENT:  Head: Normocephalic and atraumatic.  Right TMJ discomfort. No oral edema or swelling noted.   Eyes: Conjunctivae are normal.  Neck: Normal range of motion.  Cardiovascular: Normal rate, regular rhythm and normal heart sounds.   No murmur heard. Pulmonary/Chest: Effort normal and breath sounds normal. No respiratory distress. He has no wheezes. He has no rales.  Abdominal: He exhibits no distension.  Musculoskeletal: Normal range of motion.  Neurological: He is alert.  Skin: No pallor.  Psychiatric: He has a normal mood and affect. His behavior is normal.  Nursing note and vitals reviewed.  ED Treatments / Results  DIAGNOSTIC STUDIES: Oxygen Saturation is 93% on RA, adequate by my interpretation.   COORDINATION OF CARE: 5:33 PM-Discussed next steps with pt. Pt verbalized understanding and is agreeable with the plan.   Labs (all labs ordered are listed, but only abnormal results are displayed) Labs Reviewed - No data to display  EKG  EKG Interpretation None      Radiology No results found.  Procedures Procedures   Medications Ordered in ED Medications - No data to display  Initial Impression / Assessment and Plan / ED Course  I have reviewed the triage vital signs and the nursing notes.  Pertinent labs & imaging results  that were available during my care of the patient were reviewed by me and considered in my medical decision making (see chart for details).  Clinical Course    Patient presentation consistent with TMJ disorder. No findings suggestive of dental involvement or infectious process. Symptomatic care instructions provided, including prescription for NSAID. Return precautions discussed. Patient to follow up with PCP.   Final Clinical Impressions(s) / ED Diagnoses   Final diagnoses:  TMJ tenderness   New Prescriptions Discharge Medication List as of 08/21/2016  5:46 PM     I personally performed the services described in this documentation, which was scribed in my presence. The recorded information has been reviewed and is accurate.     Etta Quill, NP 08/21/16 [place], MD 08/21/16 2028

## 2016-09-11 ENCOUNTER — Emergency Department (HOSPITAL_COMMUNITY): Payer: Medicare Other

## 2016-09-11 ENCOUNTER — Emergency Department (HOSPITAL_COMMUNITY)
Admission: EM | Admit: 2016-09-11 | Discharge: 2016-09-11 | Disposition: A | Discharge status: Home / Self Care | Payer: Medicare Other | Attending: Emergency Medicine | Admitting: Emergency Medicine

## 2016-09-11 ENCOUNTER — Encounter (HOSPITAL_COMMUNITY): Payer: Self-pay | Admitting: Emergency Medicine

## 2016-09-11 DIAGNOSIS — E119 Type 2 diabetes mellitus without complications: Secondary | ICD-10-CM | POA: Insufficient documentation

## 2016-09-11 DIAGNOSIS — F17210 Nicotine dependence, cigarettes, uncomplicated: Secondary | ICD-10-CM | POA: Insufficient documentation

## 2016-09-11 DIAGNOSIS — R1031 Right lower quadrant pain: Secondary | ICD-10-CM | POA: Diagnosis not present

## 2016-09-11 DIAGNOSIS — I10 Essential (primary) hypertension: Secondary | ICD-10-CM | POA: Diagnosis not present

## 2016-09-11 DIAGNOSIS — Z79899 Other long term (current) drug therapy: Secondary | ICD-10-CM | POA: Diagnosis not present

## 2016-09-11 DIAGNOSIS — R1030 Lower abdominal pain, unspecified: Secondary | ICD-10-CM

## 2016-09-11 DIAGNOSIS — R1032 Left lower quadrant pain: Secondary | ICD-10-CM | POA: Insufficient documentation

## 2016-09-11 DIAGNOSIS — R109 Unspecified abdominal pain: Secondary | ICD-10-CM | POA: Diagnosis not present

## 2016-09-11 LAB — CBC
HCT: 45 % (ref 39.0–52.0)
Hemoglobin: 14.4 g/dL (ref 13.0–17.0)
MCH: 28.4 pg (ref 26.0–34.0)
MCHC: 32 g/dL (ref 30.0–36.0)
MCV: 88.8 fL (ref 78.0–100.0)
PLATELETS: 126 10*3/uL — AB (ref 150–400)
RBC: 5.07 MIL/uL (ref 4.22–5.81)
RDW: 16.4 % — ABNORMAL HIGH (ref 11.5–15.5)
WBC: 5.7 10*3/uL (ref 4.0–10.5)

## 2016-09-11 LAB — COMPREHENSIVE METABOLIC PANEL
ALT: 16 U/L — AB (ref 17–63)
AST: 12 U/L — ABNORMAL LOW (ref 15–41)
Albumin: 3.9 g/dL (ref 3.5–5.0)
Alkaline Phosphatase: 42 U/L (ref 38–126)
Anion gap: 9 (ref 5–15)
BUN: 11 mg/dL (ref 6–20)
CALCIUM: 9.3 mg/dL (ref 8.9–10.3)
CHLORIDE: 103 mmol/L (ref 101–111)
CO2: 25 mmol/L (ref 22–32)
CREATININE: 0.96 mg/dL (ref 0.61–1.24)
Glucose, Bld: 124 mg/dL — ABNORMAL HIGH (ref 65–99)
Potassium: 4 mmol/L (ref 3.5–5.1)
Sodium: 137 mmol/L (ref 135–145)
Total Bilirubin: 0.3 mg/dL (ref 0.3–1.2)
Total Protein: 7.6 g/dL (ref 6.5–8.1)

## 2016-09-11 LAB — URINALYSIS, ROUTINE W REFLEX MICROSCOPIC
Bilirubin Urine: NEGATIVE
Glucose, UA: NEGATIVE mg/dL
Hgb urine dipstick: NEGATIVE
Ketones, ur: 5 mg/dL — AB
LEUKOCYTES UA: NEGATIVE
Nitrite: NEGATIVE
PROTEIN: NEGATIVE mg/dL
Specific Gravity, Urine: 1.019 (ref 1.005–1.030)
pH: 6 (ref 5.0–8.0)

## 2016-09-11 LAB — LIPASE, BLOOD: LIPASE: 25 U/L (ref 11–51)

## 2016-09-11 MED ORDER — IPRATROPIUM-ALBUTEROL 0.5-2.5 (3) MG/3ML IN SOLN
3.0000 mL | Freq: Once | RESPIRATORY_TRACT | Status: AC
  Administered 2016-09-11: 3 mL via RESPIRATORY_TRACT
  Filled 2016-09-11: qty 3

## 2016-09-11 MED ORDER — IOPAMIDOL (ISOVUE-300) INJECTION 61%
INTRAVENOUS | Status: AC
  Administered 2016-09-11: 100 mL
  Filled 2016-09-11: qty 100

## 2016-09-11 NOTE — ED Notes (Signed)
Patient taken to CT.

## 2016-09-11 NOTE — ED Notes (Signed)
Pt aware we need urine sample.  Give urinal and advised him to try to go asap

## 2016-09-11 NOTE — ED Provider Notes (Signed)
[facility] DEPT Provider Note   CSN: [id number] Arrival date & time: 09/11/16  1010     History   Chief Complaint Chief Complaint  Patient presents with  . Abdominal Pain    The patient said he started having abdominal pain since last night.  He has not taken anything for the pain and said he cannot stand the pain anymore.  He denies any nausea, vomiting or diarrhea.  He rates his pain 10/10.    HPI Joaovictor Lanehart is a 66 y.o. male.  HPI   Pt with hx DM, lower GI bleed, HTN, HLD p/w right sided abdominal pain that began yesterday.  The pain was aching, constant, worse with palpation.  No appetite.  Denies urinary or bowel symptoms, N/V, fevers, chills, myalgias.  He does not drink ETOH, does not take NSAIDs.  No food changes.    Past Medical History:  Diagnosis Date  . Bipolar 2 disorder ([place]) 2005   Also carries diagnosis of schizophrenia.  . Colon adenomas 2009, 2015  . Diabetes mellitus without complication ([place])   . Hyperlipemia   . Hypertension   . Hypogonadism male    Erectile dysfunction  . Lower GI bleed 07/2015  . 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      Patient Active Problem List   Diagnosis Date Noted  . Lower GI bleed 07/26/2015  . Acute lower GI bleeding 07/26/2015  . Hematochezia 07/26/2015  . Bipolar I disorder ([place])   . HTN (hypertension), benign   . Diverticulitis of large intestine with abscess without bleeding   . Essential hypertension   . Colonic diverticular abscess   . Diverticulitis 10/13/2014  . Abscess 10/13/2014  . Erectile dysfunction 01/22/2014  . Tobacco abuse 01/22/2014  . Thrombocytopenia, unspecified 06/17/2013  . Hyperlipemia   . Bipolar 2 disorder [facility])     Past Surgical History:  Procedure Laterality Date  . COLONOSCOPY  2009, 2015  . ENTEROSCOPY N/A 07/28/2015   Procedure: ENTEROSCOPY;  Surgeon: Ladene Artist, MD;  Location: [facility] ENDOSCOPY;  Service: Endoscopy;   Laterality: N/A;       Home Medications    Prior to Admission medications   Medication Sig Start Date End Date Taking? Authorizing Provider  amLODipine (NORVASC) 10 MG tablet Take 10 mg by mouth daily.   Yes Historical Provider, MD  divalproex (DEPAKOTE ER) 500 MG 24 hr tablet Take 1,000 mg by mouth at bedtime.   Yes Historical Provider, MD  ibuprofen (ADVIL,MOTRIN) 400 MG tablet Take 1 tablet (400 mg total) by mouth every 6 (six) hours as needed. 01/01/16  Yes Charlesetta Shanks, MD  lisinopril (PRINIVIL,ZESTRIL) 40 MG tablet Take 40 mg by mouth daily.   Yes Historical Provider, MD  metoprolol succinate (TOPROL-XL) 25 MG 24 hr tablet Take 25 mg by mouth daily. 06/27/15  Yes Historical Provider, MD  naproxen (NAPROSYN) 375 MG tablet Take 1 tablet (375 mg total) by mouth 2 (two) times daily. 08/21/16  Yes Etta Quill, NP  pravastatin (PRAVACHOL) 80 MG tablet Take 80 mg by mouth every evening.    Yes Historical Provider, MD  risperidone (RISPERDAL) 4 MG tablet Take 4 mg by mouth 2 (two) times daily.   Yes Historical Provider, MD  amoxicillin-clavulanate (AUGMENTIN) 875-125 MG tablet Take 1 tablet by mouth 2 (two) times daily. Patient not taking: Reported on 09/11/2016 03/28/16   Ashley Murrain, NP  oxyCODONE-acetaminophen (PERCOCET) 5-325 MG tablet Take 2 tablets by mouth every 4 (  four) hours as needed. Patient not taking: Reported on 09/11/2016 01/01/16   Charlesetta Shanks, MD  traMADol (ULTRAM) 50 MG tablet Take 1 tablet (50 mg total) by mouth every 6 (six) hours as needed. Patient not taking: Reported on 09/11/2016 03/28/16   Ashley Murrain, NP    Family History Family History  Problem Relation Age of Onset  . Cancer Brother     abdominal CA  . Colon cancer Neg Hx     Social History Social History  Substance Use Topics  . Smoking status: Current Every Day Smoker    Packs/day: 1.00    Years: 0.00    Types: Cigarettes  . Smokeless tobacco: Never Used  . Alcohol use No     Allergies   Patient  has no known allergies.   Review of Systems Review of Systems  All other systems reviewed and are negative.    Physical Exam Updated Vital Signs BP 149/70   Pulse 66   Temp 98.8 F (37.1 C) (Oral)   Resp 19   Ht 5\' 8"  (1.727 m)   Wt 108.9 kg   SpO2 99%   BMI 36.49 kg/m   Physical Exam  Constitutional: He appears well-developed and well-nourished. No distress.  HENT:  Head: Normocephalic and atraumatic.  Neck: Neck supple.  Cardiovascular: Normal rate and regular rhythm.   Pulmonary/Chest: Effort normal and breath sounds normal. No respiratory distress. He has no wheezes. He has no rales.  Abdominal: Soft. He exhibits no distension and no mass. There is tenderness in the right lower quadrant and left lower quadrant. There is no rigidity, no rebound, no guarding and no CVA tenderness.  Neurological: He is alert. He exhibits normal muscle tone.  Skin: He is not diaphoretic.  Nursing note and vitals reviewed.    ED Treatments / Results  Labs (all labs ordered are listed, but only abnormal results are displayed) Labs Reviewed  COMPREHENSIVE METABOLIC PANEL - Abnormal; Notable for the following:       Result Value   Glucose, Bld 124 (*)    AST 12 (*)    ALT 16 (*)    All other components within normal limits  CBC - Abnormal; Notable for the following:    RDW 16.4 (*)    Platelets 126 (*)    All other components within normal limits  URINALYSIS, ROUTINE W REFLEX MICROSCOPIC - Abnormal; Notable for the following:    Ketones, ur 5 (*)    All other components within normal limits  LIPASE, BLOOD    EKG  EKG Interpretation None       Radiology Ct Abdomen Pelvis W Contrast  Result Date: 09/11/2016 CLINICAL DATA:  Acute onset of right lower quadrant abdominal pain. Initial encounter. EXAM: CT ABDOMEN AND PELVIS WITH CONTRAST TECHNIQUE: Multidetector CT imaging of the abdomen and pelvis was performed using the standard protocol following bolus administration of  intravenous contrast. CONTRAST:  100 mL ISOVUE-300 IOPAMIDOL (ISOVUE-300) INJECTION 61% COMPARISON:  CT of the abdomen and pelvis performed 10/26/2014 FINDINGS: Lower chest: Mild coronary artery calcification is seen. The visualized lung bases are grossly clear. Hepatobiliary: The liver is unremarkable in appearance. Minimal soft tissue stranding about the gallbladder could reflect mild inflammation. Would correlate for any associated symptoms. The common bile duct remains normal in caliber. Pancreas: The pancreas is within normal limits. Spleen: The spleen is unremarkable in appearance. Adrenals/Urinary Tract: The adrenal glands are unremarkable in appearance. Scattered bilateral renal cysts are seen. There are isodense foci  arising from both kidneys, measuring 1.1 cm on the right and 1.6 cm on the left. These do not appear to change significantly in attenuation on delayed images, and are likely benign. There is no evidence of hydronephrosis. No renal or ureteral stones are identified. No perinephric stranding is seen. Stomach/Bowel: The stomach is unremarkable in appearance. The small bowel is within normal limits. The appendix is normal in caliber, without evidence of appendicitis. Mild diverticulosis is noted along the ascending, transverse and descending colon, without evidence of diverticulitis. Apparent mild wall thickening at the rectum is thought to reflect intraluminal contents. Vascular/Lymphatic: Scattered calcification is seen along the abdominal aorta and its branches. There is mild ectasia along the distal abdominal aorta and common iliac arteries bilaterally, without evidence of aneurysmal dilatation. The inferior vena cava is grossly unremarkable. No retroperitoneal lymphadenopathy is seen. No pelvic sidewall lymphadenopathy is identified. Reproductive: The bladder is mildly distended and grossly unremarkable. The prostate is significantly enlarged, measuring 6.6 cm in transverse dimension. Other:  No additional soft tissue abnormalities are seen. Musculoskeletal: No acute osseous abnormalities are identified. The visualized musculature is unremarkable in appearance. IMPRESSION: 1. No acute abnormality seen to explain the patient's symptoms. 2. Apparent minimal soft tissue stranding about the gallbladder. This could reflect mild acute inflammation. Would correlate for any evidence of cholecystitis. 3. Mild diverticulosis along the ascending, transverse and descending colon, without evidence of diverticulitis. 4. Scattered aortic atherosclerosis. 5. Significantly enlarged prostate.  Would correlate with PSA. 6. Mild coronary artery calcification. 7. Scattered bilateral renal cysts noted. Isodense foci arising from both kidneys, measuring 1.1 cm on the right and 1.6 cm on the left. These appear stable on delayed images and are likely benign. Electronically Signed   By: Garald Balding M.D.   On: 09/11/2016 20:04    Procedures Procedures (including critical care time)  Medications Ordered in ED Medications  ipratropium-albuterol (DUONEB) 0.5-2.5 (3) MG/3ML nebulizer solution 3 mL (3 mLs Nebulization Given 09/11/16 1559)  iopamidol (ISOVUE-300) 61 % injection (100 mLs  Contrast Given 09/11/16 1928)     Initial Impression / Assessment and Plan / ED Course  I have reviewed the triage vital signs and the nursing notes.  Pertinent labs & imaging results that were available during my care of the patient were reviewed by me and considered in my medical decision making (see chart for details).  Clinical Course as of Sep 11 2018  Tue Sep 11, 2016  2016 Reexamination of abdomen is completely benign.   No tenderness in RUQ.  Doubt cholecystitis.  Discussed strict return precautions.    [EW]    Clinical Course User Index [EW] Clayton Bibles, PA-C   Afebrile, nontoxic patient with lower abdominal pain, initially RLQ, then LLQ as well on exam though pain very minimal.  Labs unremarkable.  CT negative though  it does show mild stranding around gallbladder.  Pt reexamined several times with continued benign abdomen and never with RUQ pain.   D/C home with PCP follow up, return precautions.  Discussed result, findings, treatment, and follow up  with patient.  Pt given return precautions.  Pt verbalizes understanding and agrees with plan.       Final Clinical Impressions(s) / ED Diagnoses   Final diagnoses:  Lower abdominal pain    New Prescriptions New Prescriptions   No medications on file     Clayton Bibles, PA-C 09/11/16 [place], DO 09/12/16 [phone]

## 2016-09-11 NOTE — ED Triage Notes (Signed)
The patient said he started having abdominal pain since last night.  He has not taken anything for the pain and said he cannot stand the pain anymore.  He denies any nausea, vomiting or diarrhea.  He rates his pain 10/10.

## 2016-09-11 NOTE — Discharge Instructions (Signed)
Read the information below.  You may return to the Emergency Department at any time for worsening condition or any new symptoms that concern you.  If you develop high fevers, worsening abdominal pain, uncontrolled vomiting, or are unable to tolerate fluids by mouth, return to the ER for a recheck.   °

## 2016-09-11 NOTE — ED Notes (Signed)
Patient Alert and oriented X4. Stable and ambulatory. Patient verbalized understanding of the discharge instructions.  Patient belongings were taken by the patient.  

## 2016-09-11 NOTE — ED Notes (Signed)
PA [place] at bedside  

## 2016-09-21 DIAGNOSIS — Z720 Tobacco use: Secondary | ICD-10-CM | POA: Diagnosis not present

## 2016-09-21 DIAGNOSIS — I119 Hypertensive heart disease without heart failure: Secondary | ICD-10-CM | POA: Diagnosis not present

## 2016-09-21 DIAGNOSIS — I10 Essential (primary) hypertension: Secondary | ICD-10-CM | POA: Diagnosis not present

## 2016-09-21 DIAGNOSIS — E1151 Type 2 diabetes mellitus with diabetic peripheral angiopathy without gangrene: Secondary | ICD-10-CM | POA: Diagnosis not present

## 2016-09-21 DIAGNOSIS — E785 Hyperlipidemia, unspecified: Secondary | ICD-10-CM | POA: Diagnosis not present

## 2016-09-21 DIAGNOSIS — Z0000 Encounter for general adult medical examination without abnormal findings: Secondary | ICD-10-CM | POA: Diagnosis not present

## 2016-10-22 DIAGNOSIS — R9431 Abnormal electrocardiogram [ECG] [EKG]: Secondary | ICD-10-CM | POA: Diagnosis not present

## 2016-10-22 DIAGNOSIS — I119 Hypertensive heart disease without heart failure: Secondary | ICD-10-CM | POA: Diagnosis not present

## 2016-10-22 DIAGNOSIS — I43 Cardiomyopathy in diseases classified elsewhere: Secondary | ICD-10-CM | POA: Diagnosis not present

## 2016-10-22 DIAGNOSIS — I10 Essential (primary) hypertension: Secondary | ICD-10-CM | POA: Diagnosis not present

## 2016-11-30 DIAGNOSIS — H538 Other visual disturbances: Secondary | ICD-10-CM | POA: Diagnosis not present

## 2016-11-30 DIAGNOSIS — Z01118 Encounter for examination of ears and hearing with other abnormal findings: Secondary | ICD-10-CM | POA: Diagnosis not present

## 2016-11-30 DIAGNOSIS — I119 Hypertensive heart disease without heart failure: Secondary | ICD-10-CM | POA: Diagnosis not present

## 2016-11-30 DIAGNOSIS — Z0000 Encounter for general adult medical examination without abnormal findings: Secondary | ICD-10-CM | POA: Diagnosis not present

## 2016-11-30 DIAGNOSIS — E785 Hyperlipidemia, unspecified: Secondary | ICD-10-CM | POA: Diagnosis not present

## 2016-11-30 DIAGNOSIS — E1151 Type 2 diabetes mellitus with diabetic peripheral angiopathy without gangrene: Secondary | ICD-10-CM | POA: Diagnosis not present

## 2016-11-30 DIAGNOSIS — Z720 Tobacco use: Secondary | ICD-10-CM | POA: Diagnosis not present

## 2016-12-20 DIAGNOSIS — H40013 Open angle with borderline findings, low risk, bilateral: Secondary | ICD-10-CM | POA: Diagnosis not present

## 2016-12-20 DIAGNOSIS — E119 Type 2 diabetes mellitus without complications: Secondary | ICD-10-CM | POA: Diagnosis not present

## 2016-12-20 DIAGNOSIS — H2513 Age-related nuclear cataract, bilateral: Secondary | ICD-10-CM | POA: Diagnosis not present

## 2016-12-20 DIAGNOSIS — H35033 Hypertensive retinopathy, bilateral: Secondary | ICD-10-CM | POA: Diagnosis not present

## 2016-12-27 ENCOUNTER — Encounter (HOSPITAL_COMMUNITY): Payer: Self-pay | Admitting: Emergency Medicine

## 2016-12-27 ENCOUNTER — Emergency Department (HOSPITAL_COMMUNITY): Payer: Medicare Other

## 2016-12-27 ENCOUNTER — Emergency Department (HOSPITAL_COMMUNITY)
Admission: EM | Admit: 2016-12-27 | Discharge: 2016-12-27 | Disposition: A | Discharge status: Home / Self Care | Payer: Medicare Other | Attending: Emergency Medicine | Admitting: Emergency Medicine

## 2016-12-27 DIAGNOSIS — I10 Essential (primary) hypertension: Secondary | ICD-10-CM | POA: Insufficient documentation

## 2016-12-27 DIAGNOSIS — E119 Type 2 diabetes mellitus without complications: Secondary | ICD-10-CM | POA: Diagnosis not present

## 2016-12-27 DIAGNOSIS — F17210 Nicotine dependence, cigarettes, uncomplicated: Secondary | ICD-10-CM | POA: Insufficient documentation

## 2016-12-27 DIAGNOSIS — R1084 Generalized abdominal pain: Secondary | ICD-10-CM | POA: Diagnosis present

## 2016-12-27 DIAGNOSIS — Z79899 Other long term (current) drug therapy: Secondary | ICD-10-CM | POA: Insufficient documentation

## 2016-12-27 DIAGNOSIS — R1033 Periumbilical pain: Secondary | ICD-10-CM | POA: Diagnosis not present

## 2016-12-27 DIAGNOSIS — Z716 Tobacco abuse counseling: Secondary | ICD-10-CM | POA: Diagnosis not present

## 2016-12-27 LAB — COMPREHENSIVE METABOLIC PANEL
ALBUMIN: 3.6 g/dL (ref 3.5–5.0)
ALK PHOS: 40 U/L (ref 38–126)
ALT: 14 U/L — AB (ref 17–63)
AST: 12 U/L — AB (ref 15–41)
Anion gap: 8 (ref 5–15)
BUN: 10 mg/dL (ref 6–20)
CALCIUM: 8.6 mg/dL — AB (ref 8.9–10.3)
CO2: 28 mmol/L (ref 22–32)
CREATININE: 1.07 mg/dL (ref 0.61–1.24)
Chloride: 103 mmol/L (ref 101–111)
GFR calc non Af Amer: >60 mL/min (ref >60)
Glucose, Bld: 132 mg/dL — ABNORMAL HIGH (ref 65–99)
Potassium: 3.7 mmol/L (ref 3.5–5.1)
SODIUM: 139 mmol/L (ref 135–145)
Total Bilirubin: 0.7 mg/dL (ref 0.3–1.2)
Total Protein: 7 g/dL (ref 6.5–8.1)

## 2016-12-27 LAB — URINALYSIS, ROUTINE W REFLEX MICROSCOPIC
BILIRUBIN URINE: NEGATIVE
GLUCOSE, UA: NEGATIVE mg/dL
Hgb urine dipstick: NEGATIVE
Ketones, ur: NEGATIVE mg/dL
Leukocytes, UA: NEGATIVE
Nitrite: NEGATIVE
PH: 7 (ref 5.0–8.0)
Protein, ur: NEGATIVE mg/dL
SPECIFIC GRAVITY, URINE: 1.018 (ref 1.005–1.030)

## 2016-12-27 LAB — CBC WITH DIFFERENTIAL/PLATELET
BASOS ABS: 0 10*3/uL (ref 0.0–0.1)
Basophils Relative: 0 %
EOS ABS: 0.1 10*3/uL (ref 0.0–0.7)
Eosinophils Relative: 2 %
HEMATOCRIT: 41.6 % (ref 39.0–52.0)
HEMOGLOBIN: 13.7 g/dL (ref 13.0–17.0)
LYMPHS PCT: 37 %
Lymphs Abs: 2.4 10*3/uL (ref 0.7–4.0)
MCH: 28.8 pg (ref 26.0–34.0)
MCHC: 32.9 g/dL (ref 30.0–36.0)
MCV: 87.6 fL (ref 78.0–100.0)
MONOS PCT: 15 %
Monocytes Absolute: 1 10*3/uL (ref 0.1–1.0)
NEUTROS ABS: 3 10*3/uL (ref 1.7–7.7)
NEUTROS PCT: 46 %
Platelets: 126 10*3/uL — ABNORMAL LOW (ref 150–400)
RBC: 4.75 MIL/uL (ref 4.22–5.81)
RDW: 14.9 % (ref 11.5–15.5)
WBC: 6.5 10*3/uL (ref 4.0–10.5)

## 2016-12-27 LAB — LIPASE, BLOOD: Lipase: 31 U/L (ref 11–51)

## 2016-12-27 MED ORDER — IOPAMIDOL (ISOVUE-300) INJECTION 61%
INTRAVENOUS | Status: AC
  Administered 2016-12-27: 100 mL via INTRAVENOUS
  Filled 2016-12-27: qty 100

## 2016-12-27 MED ORDER — GI COCKTAIL ~~LOC~~
30.0000 mL | Freq: Once | ORAL | Status: AC
  Administered 2016-12-27: 30 mL via ORAL
  Filled 2016-12-27: qty 30

## 2016-12-27 MED ORDER — ALBUTEROL SULFATE HFA 108 (90 BASE) MCG/ACT IN AERS
2.0000 | INHALATION_SPRAY | Freq: Once | RESPIRATORY_TRACT | Status: DC
  Filled 2016-12-27: qty 6.7

## 2016-12-27 MED ORDER — IPRATROPIUM-ALBUTEROL 0.5-2.5 (3) MG/3ML IN SOLN
3.0000 mL | Freq: Once | RESPIRATORY_TRACT | Status: AC
  Administered 2016-12-27: 3 mL via RESPIRATORY_TRACT
  Filled 2016-12-27: qty 3

## 2016-12-27 MED ORDER — MORPHINE SULFATE (PF) 4 MG/ML IV SOLN
4.0000 mg | Freq: Once | INTRAVENOUS | Status: AC
Start: 2016-12-27 — End: 2016-12-27
  Administered 2016-12-27: 4 mg via INTRAVENOUS
  Filled 2016-12-27: qty 1

## 2016-12-27 NOTE — ED Triage Notes (Signed)
Pt here from home with c/o right lower abd pain that started this morning , no n/v

## 2016-12-27 NOTE — ED Provider Notes (Addendum)
[facility] DEPT Provider Note   CSN: [id number] Arrival date & time: 12/27/16  9326     History   Chief Complaint Chief Complaint  Patient presents with  . Abdominal Pain    HPI Bradley [name] is a 65 y.o. male.  The history is provided by the patient.  Abdominal Pain   This is a new problem. The current episode started 1 to 2 hours ago. The problem occurs constantly. The problem has not changed since onset.The pain is associated with an unknown factor. The pain is located in the RLQ, LLQ, epigastric region and periumbilical region. The pain is at a severity of 9/10. The pain is moderate. Pertinent negatives include anorexia, fever, diarrhea, hematochezia, melena, nausea, vomiting, constipation, dysuria and frequency. Nothing aggravates the symptoms. Nothing relieves the symptoms. Past workup includes CT scan.   66 year old male who presents with abdominal pain. No prior abdominal surgeries but has a history of diverticulosis c/b diverticulitis and abscess s/p PERC drainage. Also has history of lower GI bleed, diabetes, hypertension hyperlipidemia. States that he woke up this morning with diffuse abdominal pain. Initially pain worse in the right lower quadrant, but now states that pain primarily in the periumbilical region. No nausea, vomiting, diarrhea, fevers or chills, bloody stools or melena, or any urinary complaints. States that he has had similar pain in the past, that went away after pain medications here in the ED. Pain not associated with eating. No significant alcohol use or anti-inflammatory medications.  Past Medical History:  Diagnosis Date  . Bipolar 2 disorder ([place]) 2005   Also carries diagnosis of schizophrenia.  . Colon adenomas 2009, 2015  . Diabetes mellitus without complication ([place])   . Hyperlipemia   . Hypertension   . Hypogonadism male    Erectile dysfunction  . Lower GI bleed 07/2015  . 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      Patient Active Problem List   Diagnosis Date Noted  . Lower GI bleed 07/26/2015  . Acute lower GI bleeding 07/26/2015  . Hematochezia 07/26/2015  . Bipolar I disorder ([place])   . HTN (hypertension), benign   . Diverticulitis of large intestine with abscess without bleeding   . Essential hypertension   . Colonic diverticular abscess   . Diverticulitis 10/13/2014  . Abscess 10/13/2014  . Erectile dysfunction 01/22/2014  . Tobacco abuse 01/22/2014  . Thrombocytopenia, unspecified ([place]) 06/17/2013  . Hyperlipemia   . Bipolar 2 disorder [facility])     Past Surgical History:  Procedure Laterality Date  . COLONOSCOPY  2009, 2015  . ENTEROSCOPY N/A 07/28/2015   Procedure: ENTEROSCOPY;  Surgeon: Ladene Artist, MD;  Location: [facility] ENDOSCOPY;  Service: Endoscopy;  Laterality: N/A;       Home Medications    Prior to Admission medications   Medication Sig Start Date End Date Taking? Authorizing Provider  amLODipine (NORVASC) 10 MG tablet Take 10 mg by mouth daily.    [provider]  amoxicillin-clavulanate (AUGMENTIN) 875-125 MG tablet Take 1 tablet by mouth 2 (two) times daily. Patient not taking: Reported on 09/11/2016 03/28/16   Ashley Murrain, NP  divalproex (DEPAKOTE ER) 500 MG 24 hr tablet Take 1,000 mg by mouth at bedtime.    [provider]  ibuprofen (ADVIL,MOTRIN) 400 MG tablet Take 1 tablet (400 mg total) by mouth every 6 (six) hours as needed. 01/01/16   Charlesetta Shanks, MD  lisinopril (PRINIVIL,ZESTRIL) 40 MG tablet Take 40 mg by mouth  daily.    [provider]  metoprolol succinate (TOPROL-XL) 25 MG 24 hr tablet Take 25 mg by mouth daily. 06/27/15   [provider]  naproxen (NAPROSYN) 375 MG tablet Take 1 tablet (375 mg total) by mouth 2 (two) times daily. 08/21/16   Etta Quill, NP  oxyCODONE-acetaminophen (PERCOCET) 5-325 MG tablet Take 2 tablets by mouth every 4 (four) hours as needed. Patient not taking: Reported  on 09/11/2016 01/01/16   Charlesetta Shanks, MD  pravastatin (PRAVACHOL) 80 MG tablet Take 80 mg by mouth every evening.     [provider]  risperidone (RISPERDAL) 4 MG tablet Take 4 mg by mouth 2 (two) times daily.    [provider]  traMADol (ULTRAM) 50 MG tablet Take 1 tablet (50 mg total) by mouth every 6 (six) hours as needed. Patient not taking: Reported on 09/11/2016 03/28/16   Ashley Murrain, NP    Family History Family History  Problem Relation Age of Onset  . Cancer Brother        abdominal CA  . Colon cancer Neg Hx     Social History Social History  Substance Use Topics  . Smoking status: Current Every Day Smoker    Packs/day: 1.00    Years: 0.00    Types: Cigarettes  . Smokeless tobacco: Never Used  . Alcohol use No     Allergies   Patient has no known allergies.   Review of Systems Review of Systems  Constitutional: Negative for fever.  Respiratory: Negative for shortness of breath.   Gastrointestinal: Positive for abdominal pain. Negative for anorexia, constipation, diarrhea, hematochezia, melena, nausea and vomiting.  Genitourinary: Negative for dysuria and frequency.  Allergic/Immunologic: Negative for immunocompromised state.  Hematological: Does not bruise/bleed easily.  All other systems reviewed and are negative.    Physical Exam Updated Vital Signs BP (!) 145/95 (BP Location: Right Arm)   Pulse 61   Temp 98.9 F (37.2 C) (Oral)   Resp 18   SpO2 96%   Physical Exam Physical Exam  Nursing note and vitals reviewed. Constitutional: Well developed, well nourished, non-toxic, and in no acute distress Head: Normocephalic and atraumatic.  Mouth/Throat: Oropharynx is clear and moist.  Neck: Normal range of motion. Neck supple.  Cardiovascular: Normal rate and regular rhythm.   Pulmonary/Chest: Effort normal and breath sounds with expiratory wheezing scattered.  Abdominal: Soft. There is periumbilical and epigastric tenderness. No  Murphy's sign. No significant tenderness at Mcburney's point. There is no rebound and no guarding.  Musculoskeletal: Normal range of motion.  Neurological: Alert, no facial droop, fluent speech, moves all extremities symmetrically Skin: Skin is warm and dry.  Psychiatric: Cooperative   ED Treatments / Results  Labs (all labs ordered are listed, but only abnormal results are displayed) Labs Reviewed  CBC WITH DIFFERENTIAL/PLATELET - Abnormal; Notable for the following:       Result Value   Platelets 126 (*)    All other components within normal limits  COMPREHENSIVE METABOLIC PANEL - Abnormal; Notable for the following:    Glucose, Bld 132 (*)    Calcium 8.6 (*)    AST 12 (*)    ALT 14 (*)    All other components within normal limits  LIPASE, BLOOD  URINALYSIS, ROUTINE W REFLEX MICROSCOPIC    EKG  EKG Interpretation None       Radiology Ct Abdomen Pelvis W Contrast  Result Date: 12/27/2016 CLINICAL DATA:  66 year old male with bilateral lower quadrant and periumbilical  pain since this morning. Prior diverticulitis/diverticular abscess. EXAM: CT ABDOMEN AND PELVIS WITH CONTRAST TECHNIQUE: Multidetector CT imaging of the abdomen and pelvis was performed using the standard protocol following bolus administration of intravenous contrast. CONTRAST:  15mL ISOVUE-300 IOPAMIDOL (ISOVUE-300) INJECTION 61% COMPARISON:  CT Abdomen and Pelvis 09/11/2016 and earlier. FINDINGS: Lower chest: Chronic platelike scarring or atelectasis in the anterior basal segment of the right lower lobe, mildly increased compared to January. Otherwise stable lung bases. No pericardial or pleural effusion. Hepatobiliary: Negative liver and gallbladder. Pancreas: Negative. Spleen: Negative. Adrenals/Urinary Tract: Normal adrenal glands. Stable bilateral kidneys. Mild right upper pole cortical scarring. Renal enhancement and contrast excretion is symmetric and within normal limits. Negative ureters. Unremarkable  urinary bladder. No abdominal free fluid. Stomach/Bowel: Negative rectum. Redundant sigmoid colon with mild sigmoid and moderate to severe descending colon diverticulosis. No active inflammation identified to the level of the splenic flexure. Mild diverticulosis in the transverse colon. Moderate diverticulosis at the hepatic flexure. No active inflammation. Negative cecum and appendix. Normal terminal ileum. No dilated small bowel. Negative stomach and duodenum. Vascular/Lymphatic: Aortoiliac calcified atherosclerosis. Mild arterial ectasia. Major arterial structures in the abdomen and pelvis are patent. Portal venous system appears patent. No lymphadenopathy. Reproductive: Stable, including prostate hypertrophy. Other: No pelvic free fluid. Musculoskeletal: No acute osseous abnormality identified. IMPRESSION: 1. No acute or inflammatory process identified. Diverticulosis from the hepatic flexure to the descending colon without active inflammation. Normal appendix. 2.  Calcified aortic atherosclerosis. 3. Chronic right lower lobe atelectasis and/or scarring. Electronically Signed   By: Genevie Ann M.D.   On: 12/27/2016 09:32    Procedures Procedures (including critical care time)  Medications Ordered in ED Medications  albuterol (PROVENTIL HFA;VENTOLIN HFA) 108 (90 Base) MCG/ACT inhaler 2 puff (not administered)  gi cocktail (Maalox,Lidocaine,Donnatal) (30 mLs Oral Given 12/27/16 0732)  ipratropium-albuterol (DUONEB) 0.5-2.5 (3) MG/3ML nebulizer solution 3 mL (3 mLs Nebulization Given 12/27/16 0732)  morphine 4 MG/ML injection 4 mg (4 mg Intravenous Given 12/27/16 0829)  iopamidol (ISOVUE-300) 61 % injection (100 mLs Intravenous Contrast Given 12/27/16 0905)     Initial Impression / Assessment and Plan / ED Course  I have reviewed the triage vital signs and the nursing notes.  Pertinent labs & imaging results that were available during my care of the patient were reviewed by me and considered in my  medical decision making (see chart for details).     Records reviewed. Similar abdominal pain in January 2018 when he came to the ED. Had a CT abdomen and pelvis that was overall unremarkable. Discharged home with pain control.  Today he is well-appearing and in no acute distress. Was noted to have hypoxia in triage to 88% although he denies any shortness of breath. States that he is a long-time smoker, with some wheezing noted on exam. Suspect that this may be a component of potential COPD. Given breathing treatment.On reassessment, lungs are clear. He has been able to be weaned off of oxygen on to room air. Has ambulated to the bathroom and back without any hypoxia or shortness of breath. He was 99% on room air just after walking to bathroom at time of discharge.  I suspect that this is related to some COPD changes from his chronic tobacco use. He has been counseled on tobacco cessation and resources offered. Discharge with albuterol inhaler.   Smoking cessation instruction/counseling given:  counseled patient on the dangers of tobacco use, advised patient to stop smoking, and reviewed strategies to maximize success  His abdomen is soft and not acutely surgical. With periumbilical tenderness. Differential does include potentially gastritis versus peptic ulcer disease versus early appendicitis versus atypical diverticulitis. CT abdomen and pelvis visualized and does not show any acute intra-abdominal processes. Pain fully resolved after analgesics. Blood work is reassuring and UA without infection. He is felt to be stable for discharge home.  Strict return and follow-up instructions reviewed. She expressed understanding of all discharge instructions and felt comfortable with the plan of care.   Final Clinical Impressions(s) / ED Diagnoses   Final diagnoses:  Periumbilical abdominal pain  Encounter for smoking cessation counseling    New Prescriptions New Prescriptions   No medications on  file     Forde Dandy, MD 12/27/16 [id number]    Forde Dandy, MD 12/27/16 [phone]

## 2016-12-27 NOTE — Discharge Instructions (Signed)
Your blood work, urine and CT scan of the abdomen are all reassuring. There does not appear to be emergent cause of your symptoms today.  Please follow-up with your primary care doctor as needed  Return for worsening symptoms, including fever, worsening pain, intractable vomiting or any other symptoms concerning to you.

## 2017-01-03 DIAGNOSIS — R972 Elevated prostate specific antigen [PSA]: Secondary | ICD-10-CM | POA: Diagnosis not present

## 2017-01-05 ENCOUNTER — Encounter (HOSPITAL_COMMUNITY): Payer: Self-pay | Admitting: Emergency Medicine

## 2017-01-05 ENCOUNTER — Emergency Department (HOSPITAL_COMMUNITY)
Admission: EM | Admit: 2017-01-05 | Discharge: 2017-01-05 | Disposition: A | Discharge status: Home / Self Care | Payer: Medicare Other | Attending: Emergency Medicine | Admitting: Emergency Medicine

## 2017-01-05 ENCOUNTER — Emergency Department (HOSPITAL_COMMUNITY): Payer: Medicare Other

## 2017-01-05 DIAGNOSIS — I10 Essential (primary) hypertension: Secondary | ICD-10-CM | POA: Insufficient documentation

## 2017-01-05 DIAGNOSIS — F17210 Nicotine dependence, cigarettes, uncomplicated: Secondary | ICD-10-CM | POA: Insufficient documentation

## 2017-01-05 DIAGNOSIS — Z79899 Other long term (current) drug therapy: Secondary | ICD-10-CM | POA: Insufficient documentation

## 2017-01-05 DIAGNOSIS — R05 Cough: Secondary | ICD-10-CM | POA: Insufficient documentation

## 2017-01-05 DIAGNOSIS — R059 Cough, unspecified: Secondary | ICD-10-CM

## 2017-01-05 DIAGNOSIS — E119 Type 2 diabetes mellitus without complications: Secondary | ICD-10-CM | POA: Insufficient documentation

## 2017-01-05 MED ORDER — BENZONATATE 100 MG PO CAPS: 100.0000 mg | ORAL_CAPSULE | Freq: Once | ORAL | Status: DC

## 2017-01-05 MED ORDER — ALBUTEROL SULFATE HFA 108 (90 BASE) MCG/ACT IN AERS
2.0000 | INHALATION_SPRAY | Freq: Once | RESPIRATORY_TRACT | Status: AC
  Administered 2017-01-05: 2 via RESPIRATORY_TRACT
  Filled 2017-01-05: qty 6.7

## 2017-01-05 MED ORDER — BENZONATATE 100 MG PO CAPS
200.0000 mg | ORAL_CAPSULE | Freq: Once | ORAL | Status: AC
  Administered 2017-01-05: 200 mg via ORAL
  Filled 2017-01-05: qty 2

## 2017-01-05 MED ORDER — ALBUTEROL SULFATE (2.5 MG/3ML) 0.083% IN NEBU
5.0000 mg | INHALATION_SOLUTION | Freq: Once | RESPIRATORY_TRACT | Status: AC
  Administered 2017-01-05: 5 mg via RESPIRATORY_TRACT
  Filled 2017-01-05: qty 6

## 2017-01-05 MED ORDER — BENZONATATE 100 MG PO CAPS: 200.0000 mg | ORAL_CAPSULE | Freq: Two times a day (BID) | ORAL | 0 refills | Status: DC | PRN

## 2017-01-05 MED ORDER — AEROCHAMBER PLUS FLO-VU LARGE MISC
1.0000 | Freq: Once | Status: AC
  Administered 2017-01-05: 1

## 2017-01-05 NOTE — ED Provider Notes (Signed)
[place] DEPT Provider Note   CSN: [id number] Arrival date & time: 01/05/17  0105     History   Chief Complaint Chief Complaint  Patient presents with  . Cough  . Headache    HPI Bradley [name] is a 66 y.o. male.  HPI   Patient is a 66 year old male with history of hypertension, hyperlipidemia and diabetes who presents to the ED with complaint of cough, onset last night. Patient reports he began having a nonproductive cough yesterday. He reports every time he has a coughing fit he has a generalized headache which is resolved once his coughing stops. He denies taking any medications at home for his symptoms. Denies fever, chills, lightheadedness, dizziness, visual changes, photophobia, neck pain or stiffness, rhinorrhea, nasal congestion, sore throat, itchy/watery eyes, hemoptysis, shortness of breath, wheezing, chest pain, abdominal pain, nausea, vomiting, numbness, weakness. Patient endorses smoking 1 pack per day. Denies any known sick contacts.  Past Medical History:  Diagnosis Date  . Bipolar 2 disorder ([place]) 2005   Also carries diagnosis of schizophrenia.  . Colon adenomas 2009, 2015  . Diabetes mellitus without complication ([place])   . Hyperlipemia   . Hypertension   . Hypogonadism male    Erectile dysfunction  . Lower GI bleed 07/2015  . 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      Patient Active Problem List   Diagnosis Date Noted  . Lower GI bleed 07/26/2015  . Acute lower GI bleeding 07/26/2015  . Hematochezia 07/26/2015  . Bipolar I disorder ([place])   . HTN (hypertension), benign   . Diverticulitis of large intestine with abscess without bleeding   . Essential hypertension   . Colonic diverticular abscess   . Diverticulitis 10/13/2014  . Abscess 10/13/2014  . Erectile dysfunction 01/22/2014  . Tobacco abuse 01/22/2014  . Thrombocytopenia, unspecified ([place]) 06/17/2013  . Hyperlipemia   . Bipolar 2 disorder  [facility])     Past Surgical History:  Procedure Laterality Date  . COLONOSCOPY  2009, 2015  . ENTEROSCOPY N/A 07/28/2015   Procedure: ENTEROSCOPY;  Surgeon: Ladene Artist, MD;  Location: [facility] ENDOSCOPY;  Service: Endoscopy;  Laterality: N/A;       Home Medications    Prior to Admission medications   Medication Sig Start Date End Date Taking? Authorizing Provider  amLODipine (NORVASC) 10 MG tablet Take 10 mg by mouth daily.    [provider]  amoxicillin-clavulanate (AUGMENTIN) 875-125 MG tablet Take 1 tablet by mouth 2 (two) times daily. Patient not taking: Reported on 09/11/2016 03/28/16   Ashley Murrain, NP  benzonatate (TESSALON) 100 MG capsule Take 2 capsules (200 mg total) by mouth 2 (two) times daily as needed for cough. 01/05/17   Nona Dell, PA-C  divalproex (DEPAKOTE ER) 500 MG 24 hr tablet Take 1,000 mg by mouth at bedtime.    [provider]  ibuprofen (ADVIL,MOTRIN) 400 MG tablet Take 1 tablet (400 mg total) by mouth every 6 (six) hours as needed. 01/01/16   Charlesetta Shanks, MD  lisinopril (PRINIVIL,ZESTRIL) 40 MG tablet Take 40 mg by mouth daily.    [provider]  metoprolol succinate (TOPROL-XL) 25 MG 24 hr tablet Take 25 mg by mouth daily. 06/27/15   [provider]  naproxen (NAPROSYN) 375 MG tablet Take 1 tablet (375 mg total) by mouth 2 (two) times daily. 08/21/16   Etta Quill, NP  oxyCODONE-acetaminophen (PERCOCET) 5-325 MG tablet Take 2 tablets by mouth every 4 (four)  hours as needed. Patient not taking: Reported on 09/11/2016 01/01/16   Charlesetta Shanks, MD  pravastatin (PRAVACHOL) 80 MG tablet Take 80 mg by mouth every evening.     [provider]  risperidone (RISPERDAL) 4 MG tablet Take 4 mg by mouth 2 (two) times daily.    [provider]  traMADol (ULTRAM) 50 MG tablet Take 1 tablet (50 mg total) by mouth every 6 (six) hours as needed. Patient not taking: Reported on 09/11/2016 03/28/16   Ashley Murrain, NP      Family History Family History  Problem Relation Age of Onset  . Cancer Brother        abdominal CA  . Colon cancer Neg Hx     Social History Social History  Substance Use Topics  . Smoking status: Current Every Day Smoker    Packs/day: 1.00    Years: 0.00    Types: Cigarettes  . Smokeless tobacco: Never Used  . Alcohol use No     Allergies   Patient has no known allergies.   Review of Systems Review of Systems  Respiratory: Positive for cough.   Neurological: Positive for headaches.  All other systems reviewed and are negative.    Physical Exam Updated Vital Signs BP 120/69   Pulse 65   Temp 97.4 F (36.3 C)   Resp 20   Ht 5\' 8"  (1.727 m)   Wt 230 lb (104.3 kg)   SpO2 100%   BMI 34.97 kg/m   Physical Exam  Constitutional: He is oriented to person, place, and time. He appears well-developed and well-nourished. No distress.  HENT:  Head: Normocephalic and atraumatic.  Right Ear: Tympanic membrane normal.  Left Ear: Tympanic membrane normal.  Nose: Nose normal.  Mouth/Throat: Uvula is midline, oropharynx is clear and moist and mucous membranes are normal. No oropharyngeal exudate, posterior oropharyngeal edema, posterior oropharyngeal erythema or tonsillar abscesses. No tonsillar exudate.  Eyes: Conjunctivae and EOM are normal. Pupils are equal, round, and reactive to light. Right eye exhibits no discharge. Left eye exhibits no discharge. No scleral icterus.  Neck: Normal range of motion. Neck supple.  Cardiovascular: Normal rate, regular rhythm, normal heart sounds and intact distal pulses.   Pulmonary/Chest: Effort normal. No respiratory distress. He has wheezes. He has no rales. He exhibits no tenderness.  Mild expiratory wheezing present to LLL, RML and RLL.   Abdominal: Soft. Bowel sounds are normal. He exhibits no distension and no mass. There is no tenderness. There is no rebound and no guarding.  Musculoskeletal: Normal range of motion. He  exhibits no edema or tenderness.  Lymphadenopathy:    He has no cervical adenopathy.  Neurological: He is alert and oriented to person, place, and time. He has normal strength. No cranial nerve deficit or sensory deficit. He displays a negative Romberg sign. Coordination and gait normal.  Skin: Skin is warm and dry. He is not diaphoretic.  Nursing note and vitals reviewed.    ED Treatments / Results  Labs (all labs ordered are listed, but only abnormal results are displayed) Labs Reviewed - No data to display  EKG  EKG Interpretation None       Radiology Dg Chest 2 View  Result Date: 01/05/2017 CLINICAL DATA:  Cough, headache EXAM: CHEST  2 VIEW COMPARISON:  01/01/2016 FINDINGS: Elevated right hemidiaphragm all. Bibasilar atelectasis. Heart is normal size. No visible effusions or acute bony abnormality. IMPRESSION: Mild elevation of the right hemidiaphragm.  Bibasilar atelectasis. Electronically Signed  By: Rolm Baptise M.D.   On: 01/05/2017 07:27    Procedures Procedures (including critical care time)  Medications Ordered in ED Medications  albuterol (PROVENTIL HFA;VENTOLIN HFA) 108 (90 Base) MCG/ACT inhaler 2 puff (not administered)  benzonatate (TESSALON) capsule 200 mg (not administered)  albuterol (PROVENTIL) (2.5 MG/3ML) 0.083% nebulizer solution 5 mg (5 mg Nebulization Given 01/05/17 7530)     Initial Impression / Assessment and Plan / ED Course  I have reviewed the triage vital signs and the nursing notes.  Pertinent labs & imaging results that were available during my care of the patient were reviewed by me and considered in my medical decision making (see chart for details).     Patient presents with a nonproductive cough that started last night. Also reports having a mild headache that is only present when having a coughing attack. Denies fever, hemoptysis, shortness of breath, chest pain. Endorses smoking 1 pack per day. VSS. Exam revealed mild end expiratory  wheezes in left lower lobe, right middle and right lower lobes; no signs of respiratory distress. Remaining exam unremarkable. Pt denies having any HA. No neuro deficits on exam. Patient given neb treatment in the ED. Chest x-ray is no effusion or opacity. On reevaluation patient reports feeling better after receiving his neb treatment. Suspect sxs are likely due to viral bronchitis. Plan to discharge patient home with albuterol inhaler and antitussive. Advised patient to follow up with his PCP in 3-4 days as needed. Discussed smoking cessation with patient. Discussed return precautions.  Final Clinical Impressions(s) / ED Diagnoses   Final diagnoses:  Cough    New Prescriptions New Prescriptions   BENZONATATE (TESSALON) 100 MG CAPSULE    Take 2 capsules (200 mg total) by mouth 2 (two) times daily as needed for cough.        Nona Dell, PA-C 01/05/17 0511    Sherwood Gambler, MD 01/05/17 [phone]

## 2017-01-05 NOTE — Discharge Instructions (Signed)
Take your medication as prescribed as and for cough. Refrain from smoking as this will worsen your symptoms. Continue drinking fluids at home to remain hydrated. Continue taking your home medications as prescribed. Follow-up with your primary care provider within the next 3-4 days if her symptoms have not improved. Please return to the Emergency Department if symptoms worsen or new onset of fever, headache, visual changes dizziness, coughing up blood, difficulty breathing, chest pain, abdominal pain, vomiting.

## 2017-01-05 NOTE — ED Triage Notes (Signed)
Reports having a cough that is causing his head to hurt that started tonight.  Has not taken anything for either.  Coughing up clear sputum occasionally.

## 2017-03-06 ENCOUNTER — Encounter (HOSPITAL_COMMUNITY): Payer: Self-pay

## 2017-03-06 ENCOUNTER — Emergency Department (HOSPITAL_COMMUNITY): Payer: Medicare Other

## 2017-03-06 ENCOUNTER — Emergency Department (HOSPITAL_COMMUNITY)
Admission: EM | Admit: 2017-03-06 | Discharge: 2017-03-07 | Disposition: A | Discharge status: Home / Self Care | Payer: Medicare Other | Attending: Emergency Medicine | Admitting: Emergency Medicine

## 2017-03-06 DIAGNOSIS — E785 Hyperlipidemia, unspecified: Secondary | ICD-10-CM | POA: Insufficient documentation

## 2017-03-06 DIAGNOSIS — R0782 Intercostal pain: Secondary | ICD-10-CM | POA: Insufficient documentation

## 2017-03-06 DIAGNOSIS — E119 Type 2 diabetes mellitus without complications: Secondary | ICD-10-CM | POA: Insufficient documentation

## 2017-03-06 DIAGNOSIS — F17210 Nicotine dependence, cigarettes, uncomplicated: Secondary | ICD-10-CM | POA: Diagnosis not present

## 2017-03-06 DIAGNOSIS — R1011 Right upper quadrant pain: Secondary | ICD-10-CM | POA: Insufficient documentation

## 2017-03-06 DIAGNOSIS — I10 Essential (primary) hypertension: Secondary | ICD-10-CM | POA: Insufficient documentation

## 2017-03-06 DIAGNOSIS — Z79899 Other long term (current) drug therapy: Secondary | ICD-10-CM | POA: Diagnosis not present

## 2017-03-06 DIAGNOSIS — R0781 Pleurodynia: Secondary | ICD-10-CM | POA: Diagnosis not present

## 2017-03-06 DIAGNOSIS — R079 Chest pain, unspecified: Secondary | ICD-10-CM | POA: Diagnosis not present

## 2017-03-06 LAB — BASIC METABOLIC PANEL
ANION GAP: 8 (ref 5–15)
BUN: 9 mg/dL (ref 6–20)
CO2: 26 mmol/L (ref 22–32)
Calcium: 9.2 mg/dL (ref 8.9–10.3)
Chloride: 106 mmol/L (ref 101–111)
Creatinine, Ser: 1.1 mg/dL (ref 0.61–1.24)
GFR calc Af Amer: >60 mL/min (ref >60)
Glucose, Bld: 118 mg/dL — ABNORMAL HIGH (ref 65–99)
POTASSIUM: 4.1 mmol/L (ref 3.5–5.1)
SODIUM: 140 mmol/L (ref 135–145)

## 2017-03-06 LAB — HEPATIC FUNCTION PANEL
ALT: 15 U/L — ABNORMAL LOW (ref 17–63)
AST: 14 U/L — AB (ref 15–41)
Albumin: 3.7 g/dL (ref 3.5–5.0)
Alkaline Phosphatase: 44 U/L (ref 38–126)
BILIRUBIN DIRECT: 0.2 mg/dL (ref 0.1–0.5)
Indirect Bilirubin: 0.4 mg/dL (ref 0.3–0.9)
Total Bilirubin: 0.6 mg/dL (ref 0.3–1.2)
Total Protein: 7.4 g/dL (ref 6.5–8.1)

## 2017-03-06 LAB — I-STAT TROPONIN, ED
TROPONIN I, POC: 0 ng/mL (ref 0.00–0.08)
Troponin i, poc: 0 ng/mL (ref 0.00–0.08)

## 2017-03-06 LAB — CBC
HEMATOCRIT: 42.9 % (ref 39.0–52.0)
Hemoglobin: 14.1 g/dL (ref 13.0–17.0)
MCH: 29.4 pg (ref 26.0–34.0)
MCHC: 32.9 g/dL (ref 30.0–36.0)
MCV: 89.6 fL (ref 78.0–100.0)
Platelets: 146 10*3/uL — ABNORMAL LOW (ref 150–400)
RBC: 4.79 MIL/uL (ref 4.22–5.81)
RDW: 15.2 % (ref 11.5–15.5)
WBC: 6.9 10*3/uL (ref 4.0–10.5)

## 2017-03-06 LAB — LIPASE, BLOOD: LIPASE: 30 U/L (ref 11–51)

## 2017-03-06 LAB — D-DIMER, QUANTITATIVE (NOT AT ARMC)

## 2017-03-06 MED ORDER — IBUPROFEN 800 MG PO TABS
800.0000 mg | ORAL_TABLET | Freq: Once | ORAL | Status: AC
  Administered 2017-03-07: 800 mg via ORAL
  Filled 2017-03-06: qty 1

## 2017-03-06 NOTE — ED Provider Notes (Signed)
[facility] DEPT Provider Note   CSN: [id number] Arrival date & time: 03/06/17  2017  By signing my name below, I, Reola Mosher, attest that this documentation has been prepared under the direction and in the presence of Ziara Thelander, Annie Main, MD. Electronically Signed: Reola Mosher, ED Scribe. 03/06/17. 11:12 PM.  History   Chief Complaint Chief Complaint  Patient presents with  . Chest Pain   The history is provided by the patient. No language interpreter was used.    HPI Comments: Bradley [name] is a 66 y.o. male with a PMHx of DM, HLD, HTN, who presents to the Emergency Department complaining of intermittent, non-radiating right lateral ribcage pain beginning this morning. Pt notes that he woke up with his pain this morning; he was asymptomatic yesterday evening prior to going to bed. He reports that his pain is precipitated and exacerbated with bending forwards and coughing. No treatments for his pain were tried prior to coming into the ED. No recent falls, injuries, or trauma. He reports associated cough productive of white sputum. No PSHx to the abdomen. Pt does not drink alcohol. He denies abdominal pain, shortness of breath, fever, loss of appetite, diarrhea, constipation, dysuria, hematuria, diaphoresis, nausea, vomiting, or any other associated symptoms.    Past Medical History:  Diagnosis Date  . Bipolar 2 disorder ([place]) 2005   Also carries diagnosis of schizophrenia.  . Colon adenomas 2009, 2015  . Diabetes mellitus without complication ([place])   . Hyperlipemia   . Hypertension   . Hypogonadism male    Erectile dysfunction  . Lower GI bleed 07/2015  . 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     Patient Active Problem List   Diagnosis Date Noted  . Lower GI bleed 07/26/2015  . Acute lower GI bleeding 07/26/2015  . Hematochezia 07/26/2015  . Bipolar I disorder ([place])   . HTN (hypertension), benign   .  Diverticulitis of large intestine with abscess without bleeding   . Essential hypertension   . Colonic diverticular abscess   . Diverticulitis 10/13/2014  . Abscess 10/13/2014  . Erectile dysfunction 01/22/2014  . Tobacco abuse 01/22/2014  . Thrombocytopenia, unspecified ([place]) 06/17/2013  . Hyperlipemia   . Bipolar 2 disorder [facility])    Past Surgical History:  Procedure Laterality Date  . COLONOSCOPY  2009, 2015  . ENTEROSCOPY N/A 07/28/2015   Procedure: ENTEROSCOPY;  Surgeon: Ladene Artist, MD;  Location: [facility] ENDOSCOPY;  Service: Endoscopy;  Laterality: N/A;    Home Medications    Prior to Admission medications   Medication Sig Start Date End Date Taking? Authorizing Provider  amLODipine (NORVASC) 10 MG tablet Take 10 mg by mouth daily.    [provider]  amoxicillin-clavulanate (AUGMENTIN) 875-125 MG tablet Take 1 tablet by mouth 2 (two) times daily. Patient not taking: Reported on 09/11/2016 03/28/16   Ashley Murrain, NP  benzonatate (TESSALON) 100 MG capsule Take 2 capsules (200 mg total) by mouth 2 (two) times daily as needed for cough. 01/05/17   Nona Dell, PA-C  divalproex (DEPAKOTE ER) 500 MG 24 hr tablet Take 1,000 mg by mouth at bedtime.    [provider]  ibuprofen (ADVIL,MOTRIN) 400 MG tablet Take 1 tablet (400 mg total) by mouth every 6 (six) hours as needed. 01/01/16   Charlesetta Shanks, MD  lisinopril (PRINIVIL,ZESTRIL) 40 MG tablet Take 40 mg by mouth daily.    [provider]  metoprolol succinate (TOPROL-XL) 25 MG 24  hr tablet Take 25 mg by mouth daily. 06/27/15   [provider]  naproxen (NAPROSYN) 375 MG tablet Take 1 tablet (375 mg total) by mouth 2 (two) times daily. 08/21/16   Etta Quill, NP  oxyCODONE-acetaminophen (PERCOCET) 5-325 MG tablet Take 2 tablets by mouth every 4 (four) hours as needed. Patient not taking: Reported on 09/11/2016 01/01/16   Charlesetta Shanks, MD  pravastatin (PRAVACHOL) 80 MG tablet Take 80 mg by  mouth every evening.     [provider]  risperidone (RISPERDAL) 4 MG tablet Take 4 mg by mouth 2 (two) times daily.    [provider]  traMADol (ULTRAM) 50 MG tablet Take 1 tablet (50 mg total) by mouth every 6 (six) hours as needed. Patient not taking: Reported on 09/11/2016 03/28/16   Ashley Murrain, NP   Family History Family History  Problem Relation Age of Onset  . Cancer Brother        abdominal CA  . Colon cancer Neg Hx    Social History Social History  Substance Use Topics  . Smoking status: Current Every Day Smoker    Packs/day: 1.00    Years: 0.00    Types: Cigarettes  . Smokeless tobacco: Never Used  . Alcohol use No   Allergies   Patient has no known allergies.  Review of Systems Review of Systems  A complete review of systems was obtained and all systems are negative except as noted in the HPI and PMH.   Physical Exam Updated Vital Signs BP (!) 154/77 (BP Location: Left Arm)   Pulse 68   Temp 98.4 F (36.9 C) (Oral)   Resp 18   SpO2 96%   Physical Exam  Constitutional: He is oriented to person, place, and time. He appears well-developed and well-nourished. No distress.  HENT:  Head: Normocephalic and atraumatic.  Mouth/Throat: Oropharynx is clear and moist. No oropharyngeal exudate.  Eyes: Pupils are equal, round, and reactive to light. Conjunctivae and EOM are normal.  Neck: Normal range of motion. Neck supple.  No meningismus.  Cardiovascular: Normal rate, regular rhythm, normal heart sounds and intact distal pulses.   No murmur heard. Pulmonary/Chest: Effort normal. No respiratory distress. He has no wheezes. He has rhonchi. He has no rales. He exhibits tenderness.  TTP over the right lower anterior lateral ribs. Scattered rhonchi.   Abdominal: Soft. There is tenderness. There is no rebound, no guarding and no CVA tenderness.  TTP over the RUQ.  Musculoskeletal: Normal range of motion. He exhibits no edema or tenderness.    Neurological: He is alert and oriented to person, place, and time. No cranial nerve deficit. He exhibits normal muscle tone. Coordination normal.   5/5 strength throughout. CN 2-12 intact.Equal grip strength.   Skin: Skin is warm.  Psychiatric: He has a normal mood and affect. His behavior is normal.  Nursing note and vitals reviewed.  ED Treatments / Results  DIAGNOSTIC STUDIES: Oxygen Saturation is 96% on RA, normal by my interpretation.   COORDINATION OF CARE: 11:11 PM-Discussed next steps with pt. Pt verbalized understanding and is agreeable with the plan.   Labs (all labs ordered are listed, but only abnormal results are displayed) Labs Reviewed  BASIC METABOLIC PANEL - Abnormal; Notable for the following:       Result Value   Glucose, Bld 118 (*)    All other components within normal limits  CBC - Abnormal; Notable for the following:    Platelets 146 (*)  All other components within normal limits  HEPATIC FUNCTION PANEL - Abnormal; Notable for the following:    AST 14 (*)    ALT 15 (*)    All other components within normal limits  D-DIMER, QUANTITATIVE (NOT AT [facility])  LIPASE, BLOOD  I-STAT TROPONIN, ED  I-STAT TROPONIN, ED   EKG  EKG Interpretation  Date/Time:  Wednesday March 06 2017 20:30:36 EDT Ventricular Rate:  65 PR Interval:  132 QRS Duration: 82 QT Interval:  396 QTC Calculation: 411 R Axis:   -34 Text Interpretation:  Normal sinus rhythm Left axis deviation Anterior infarct , age undetermined T wave abnormality, consider inferolateral ischemia Abnormal ECG stable T wave inversions inferior and laterally  Confirmed by Ezequiel Essex [phone]) on 03/06/2017 10:59:58 PM      Radiology Dg Chest 2 View  Result Date: 03/06/2017 CLINICAL DATA:  Chest pain EXAM: CHEST  2 VIEW COMPARISON:  Jan 05, 2017 FINDINGS: There is slight atelectasis in lung bases. There is no edema or consolidation. Heart size and pulmonary vascularity are normal. No adenopathy. No  bone lesions. No pneumothorax. IMPRESSION: Slight bibasilar atelectasis.  No edema or consolidation. Electronically Signed   By: Lowella Grip III M.D.   On: 03/06/2017 20:59   Procedures Procedures  Medications Ordered in ED Medications - No data to display  Initial Impression / Assessment and Plan / ED Course  I have reviewed the triage vital signs and the nursing notes.  Pertinent labs & imaging results that were available during my care of the patient were reviewed by me and considered in my medical decision making (see chart for details).    R rib and upper abdominal pain since yesterday, worse with coughing and deep breathing. TTP to palpation  EKG with unchanged T wave inversions. CXR negative Troponin and D-dimer negative.  LFTs and lipase normal.   Ultrasound shows no evidence of cholecystitis. There is a gallbladder polyp.  Patient's rib pain appears to be muscular likely from coughing. Patient will be given bronchodilators, anti-inflammatories. Smoking Cessation encouraged. Did have transient hypoxia in the ED of questionable validity.  97% on RA on my exam. Clear lungs.   Follow up with PCP. Return precautions discussed.  Final Clinical Impressions(s) / ED Diagnoses   Final diagnoses:  RUQ pain  Rib pain on right side   New Prescriptions Discharge Medication List as of 03/07/2017  1:57 AM    START taking these medications   Details  albuterol (PROVENTIL HFA;VENTOLIN HFA) 108 (90 Base) MCG/ACT inhaler Inhale 1-2 puffs into the lungs every 6 (six) hours as needed for wheezing or shortness of breath., Starting Thu 03/07/2017, Print    naproxen (NAPROSYN) 500 MG tablet Take 1 tablet (500 mg total) by mouth 2 (two) times daily., Starting Thu 03/07/2017, Print       I personally performed the services described in this documentation, which was scribed in my presence. The recorded information has been reviewed and is accurate.     Ezequiel Essex, MD 03/07/17  [phone]

## 2017-03-06 NOTE — ED Triage Notes (Signed)
Pt states that he started having R sided rib pain this morning, non radiation, denies SOB, no other cardiac symptoms.

## 2017-03-06 NOTE — ED Notes (Signed)
Patient transported to Ultrasound 

## 2017-03-07 MED ORDER — ALBUTEROL SULFATE HFA 108 (90 BASE) MCG/ACT IN AERS: 1.0000 | INHALATION_SPRAY | Freq: Four times a day (QID) | RESPIRATORY_TRACT | 0 refills | Status: DC | PRN

## 2017-03-07 MED ORDER — IPRATROPIUM-ALBUTEROL 0.5-2.5 (3) MG/3ML IN SOLN
3.0000 mL | Freq: Once | RESPIRATORY_TRACT | Status: AC
  Administered 2017-03-07: 3 mL via RESPIRATORY_TRACT
  Filled 2017-03-07: qty 3

## 2017-03-07 MED ORDER — NAPROXEN 500 MG PO TABS: 500.0000 mg | ORAL_TABLET | Freq: Two times a day (BID) | ORAL | 0 refills | Status: DC

## 2017-03-07 NOTE — Discharge Instructions (Signed)
There is no evidence of heart attack or blood clot along. No pneumonia. Stop smoking. Use the inhaler is needed.  follow-up with your doctor and return to the ED if you develop new or worsening symptoms.

## 2017-03-28 ENCOUNTER — Emergency Department (HOSPITAL_COMMUNITY): Payer: Medicare Other

## 2017-03-28 ENCOUNTER — Encounter (HOSPITAL_COMMUNITY): Payer: Self-pay | Admitting: Emergency Medicine

## 2017-03-28 ENCOUNTER — Emergency Department (HOSPITAL_COMMUNITY)
Admission: EM | Admit: 2017-03-28 | Discharge: 2017-03-28 | Disposition: A | Discharge status: Home / Self Care | Payer: Medicare Other | Attending: Emergency Medicine | Admitting: Emergency Medicine

## 2017-03-28 DIAGNOSIS — Z79899 Other long term (current) drug therapy: Secondary | ICD-10-CM | POA: Insufficient documentation

## 2017-03-28 DIAGNOSIS — M25512 Pain in left shoulder: Secondary | ICD-10-CM | POA: Diagnosis not present

## 2017-03-28 DIAGNOSIS — M549 Dorsalgia, unspecified: Secondary | ICD-10-CM | POA: Insufficient documentation

## 2017-03-28 DIAGNOSIS — I10 Essential (primary) hypertension: Secondary | ICD-10-CM | POA: Diagnosis not present

## 2017-03-28 DIAGNOSIS — F17210 Nicotine dependence, cigarettes, uncomplicated: Secondary | ICD-10-CM | POA: Diagnosis not present

## 2017-03-28 MED ORDER — IBUPROFEN 600 MG PO TABS: 600.0000 mg | ORAL_TABLET | Freq: Four times a day (QID) | ORAL | 0 refills | Status: DC | PRN

## 2017-03-28 MED ORDER — ACETAMINOPHEN 500 MG PO TABS: 500.0000 mg | ORAL_TABLET | Freq: Four times a day (QID) | ORAL | 0 refills | Status: DC | PRN

## 2017-03-28 MED ORDER — IBUPROFEN 400 MG PO TABS
600.0000 mg | ORAL_TABLET | Freq: Once | ORAL | Status: AC
  Administered 2017-03-28: 600 mg via ORAL
  Filled 2017-03-28: qty 1

## 2017-03-28 NOTE — Discharge Instructions (Signed)
Alternate 600 mg of ibuprofen and [phone] mg of Tylenol every 3 hours as needed for pain. Do not exceed 4000 mg of Tylenol daily. Apply ice or heat to the affected area for comfort. Do some gentle stretching of the area to avoid stiffness. Follow up with your primary care physician or an orthopedic doctor for reevaluation. Return to the ED immediately if any concerning signs or symptoms develop.

## 2017-03-28 NOTE — ED Triage Notes (Signed)
Patient states that his upper back on the left started hurting, no injury per patient.  Patient started three days ago.  Patient now having pain when he lifts arm and into left shoulder.

## 2017-03-28 NOTE — ED Provider Notes (Signed)
[facility] DEPT Provider Note   CSN: [id number] Arrival date & time: 03/28/17  1540  By signing my name below, I, Theresia Bough, attest that this documentation has been prepared under the direction and in the presence of non-physician practitioner, Rodell Perna, PA-C. Electronically Signed: Theresia Bough, ED Scribe. 03/28/17. 10:54 AM.  History   Chief Complaint Chief Complaint  Patient presents with  . Back Pain  . Shoulder Pain   The history is provided by the patient. No language interpreter was used.   HPI Comments: Bradley [name] is a 66 y.o. male who presents to the Emergency Department complaining of Gradual onset, progressively worsening, moderate left shoulder pain onset 3 days ago. Denies heavy lifting, fall or injury. No hx of similar symptoms. Pt describes pain as achy and throbbing. Pt states pain is exacerbated by raising the arm upwards. No treatments tried PTA. No new medications. Pt denies numbness/tingling, weakess or any other complaints at this time.  Past Medical History:  Diagnosis Date  . Bipolar 2 disorder ([place]) 2005   Also carries diagnosis of schizophrenia.  . Colon adenomas 2009, 2015  . Diabetes mellitus without complication ([place])   . Hyperlipemia   . Hypertension   . Hypogonadism male    Erectile dysfunction  . Lower GI bleed 07/2015  . 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      Patient Active Problem List   Diagnosis Date Noted  . Lower GI bleed 07/26/2015  . Acute lower GI bleeding 07/26/2015  . Hematochezia 07/26/2015  . Bipolar I disorder ([place])   . HTN (hypertension), benign   . Diverticulitis of large intestine with abscess without bleeding   . Essential hypertension   . Colonic diverticular abscess   . Diverticulitis 10/13/2014  . Abscess 10/13/2014  . Erectile dysfunction 01/22/2014  . Tobacco abuse 01/22/2014  . Thrombocytopenia, unspecified ([place]) 06/17/2013  . Hyperlipemia   .  Bipolar 2 disorder [facility])     Past Surgical History:  Procedure Laterality Date  . COLONOSCOPY  2009, 2015  . ENTEROSCOPY N/A 07/28/2015   Procedure: ENTEROSCOPY;  Surgeon: Ladene Artist, MD;  Location: [facility] ENDOSCOPY;  Service: Endoscopy;  Laterality: N/A;       Home Medications    Prior to Admission medications   Medication Sig Start Date End Date Taking? Authorizing Provider  acetaminophen (TYLENOL) 500 MG tablet Take 1 tablet (500 mg total) by mouth every 6 (six) hours as needed for mild pain or moderate pain. 03/28/17   Delos Klich A, PA-C  albuterol (PROVENTIL HFA;VENTOLIN HFA) 108 (90 Base) MCG/ACT inhaler Inhale 1-2 puffs into the lungs every 6 (six) hours as needed for wheezing or shortness of breath. 03/07/17   Rancour, Annie Main, MD  amLODipine (NORVASC) 10 MG tablet Take 10 mg by mouth daily.    [provider]  divalproex (DEPAKOTE ER) 500 MG 24 hr tablet Take 1,000 mg by mouth at bedtime.    [provider]  ibuprofen (ADVIL,MOTRIN) 600 MG tablet Take 1 tablet (600 mg total) by mouth every 6 (six) hours as needed for mild pain or moderate pain. 03/28/17   Sebastain Fishbaugh A, PA-C  lisinopril (PRINIVIL,ZESTRIL) 40 MG tablet Take 40 mg by mouth daily.    [provider]  naproxen (NAPROSYN) 500 MG tablet Take 1 tablet (500 mg total) by mouth 2 (two) times daily. 03/07/17   Rancour, Annie Main, MD  pravastatin (PRAVACHOL) 80 MG tablet Take 80 mg by mouth every evening.  [provider]  risperidone (RISPERDAL) 4 MG tablet Take 4 mg by mouth at bedtime.     [provider]    Family History Family History  Problem Relation Age of Onset  . Cancer Brother        abdominal CA  . Colon cancer Neg Hx     Social History Social History  Substance Use Topics  . Smoking status: Current Every Day Smoker    Packs/day: 1.00    Years: 0.00    Types: Cigarettes  . Smokeless tobacco: Never Used  . Alcohol use No     Allergies   Patient has no  known allergies.   Review of Systems Review of Systems  Respiratory: Negative for shortness of breath.   Cardiovascular: Negative for chest pain.  Musculoskeletal: Positive for myalgias.  Neurological: Negative for weakness and numbness.     Physical Exam Updated Vital Signs BP (!) 141/84 (BP Location: Right Arm)   Pulse 86   Temp 98.1 F (36.7 C) (Oral)   Resp 18   Ht 5\' 8"  (1.727 m)   Wt 104.3 kg (230 lb)   SpO2 95%   BMI 34.97 kg/m   Physical Exam  Constitutional: He appears well-developed and well-nourished. No distress.  HENT:  Head: Normocephalic and atraumatic.  Right Ear: External ear normal.  Left Ear: External ear normal.  Eyes: Conjunctivae are normal. Right eye exhibits no discharge. Left eye exhibits no discharge.  Neck: Normal range of motion. Neck supple. No JVD present. No tracheal deviation present.  Cardiovascular: Normal rate and intact distal pulses.   2+ radial pulses bilaterally  Pulmonary/Chest: Effort normal.  Abdominal: He exhibits no distension.  Musculoskeletal: Normal range of motion. He exhibits tenderness. He exhibits no edema.  Pain elicited on upward motion of the left shoulder and internal rotation to touch the back. Positive Hawkins, positive empty can sign. No deformity or crepitus noted. No swelling, erythema, or warmth. 5/5 strength of BLE major muscle groups, however with pain on testing of the deltoid, biceps and triceps of the left arm. No midline spine TTP in the CSP, TSP , or LSP, no paraspinal muscle tenderness, no deformity, crepitus, or step-off noted.  Neurological: He is alert.  Fluent speech, no facial droop, sensation intact to soft touch of bilateral upper extremities with good grip strength.  Skin: Skin is warm and dry. Capillary refill takes less than 2 seconds. No erythema.  Psychiatric: He has a normal mood and affect. His behavior is normal.  Nursing note and vitals reviewed.    ED Treatments / Results  DIAGNOSTIC  STUDIES: Oxygen Saturation is 94% on RA, normal by my interpretation.   COORDINATION OF CARE: 9:32 AM-Discussed next steps with pt including XR. Pt verbalized understanding and is agreeable with the plan.   Labs (all labs ordered are listed, but only abnormal results are displayed) Labs Reviewed - No data to display  EKG  EKG Interpretation None       Radiology Dg Shoulder Left  Result Date: 03/28/2017 CLINICAL DATA:  Posterior left shoulder pain worse with movement. No injury. EXAM: LEFT SHOULDER - 2+ VIEW COMPARISON:  Chest x-ray 01/05/2017 FINDINGS: Mild degenerative change of the [facility] joint. No significant degenerative change of the glenohumeral joint. No evidence of fracture dislocation. Smooth well corticated 4.1 cm fragment just superior to the humeral head and abutting the lateral surface of the scapula at the [facility] joint compatible with a chronic finding. IMPRESSION: No acute findings. Mild degenerative  change of the [facility] joint. 4.1 cm chronic fragment lateral to the scapula and immediately superior to the humeral head. Electronically Signed   By: Marin Olp M.D.   On: 03/28/2017 10:17    Procedures Procedures (including critical care time)  Medications Ordered in ED Medications  ibuprofen (ADVIL,MOTRIN) tablet 600 mg (600 mg Oral Given 03/28/17 7939)     Initial Impression / Assessment and Plan / ED Course  I have reviewed the triage vital signs and the nursing notes.  Pertinent labs & imaging results that were available during my care of the patient were reviewed by me and considered in my medical decision making (see chart for details).      Patient with acute onset of left shoulder pain without history of trauma or injury. Afebrile, vital signs are stable, and he is neurovascularly intact. Patient X-Ray negative for obvious fracture or dislocation. However there is a 4.1 cm chronic fragment lateral to the scapula and immediately superior to the humeral head which I  believe could likely be contributing to his pain and causing compression of soft tissue structures and nerves. Physical examination also consistent with possible rotator cuff injury. Does not appear to be radicular pain from the neck. Pt advised to follow up with orthopedics. Patient given ibuprofen while in ED with some improvement, conservative therapy recommended and discussed. Patient will be discharged home & is agreeable with above plan. Discussed strict indications for return to the ED. Pt verbalized understanding of and agreement with plan and is safe for discharge home at this time.  Final Clinical Impressions(s) / ED Diagnoses   Final diagnoses:  Acute pain of left shoulder    New Prescriptions Discharge Medication List as of 03/28/2017 10:38 AM    START taking these medications   Details  acetaminophen (TYLENOL) 500 MG tablet Take 1 tablet (500 mg total) by mouth every 6 (six) hours as needed for mild pain or moderate pain., Starting Thu 03/28/2017, Print       I personally performed the services described in this documentation, which was scribed in my presence. The recorded information has been reviewed and is accurate.     Renita Papa, PA-C 03/28/17 1055    Fredia Sorrow, MD 03/29/17 [phone]

## 2017-03-28 NOTE — ED Notes (Signed)
Patient transported to X-ray 

## 2017-04-01 DIAGNOSIS — Z79899 Other long term (current) drug therapy: Secondary | ICD-10-CM | POA: Diagnosis not present

## 2017-04-11 ENCOUNTER — Encounter (INDEPENDENT_AMBULATORY_CARE_PROVIDER_SITE_OTHER): Payer: Self-pay | Admitting: Orthopaedic Surgery

## 2017-04-11 ENCOUNTER — Ambulatory Visit (INDEPENDENT_AMBULATORY_CARE_PROVIDER_SITE_OTHER): Payer: Medicare Other | Admitting: Orthopaedic Surgery

## 2017-04-11 DIAGNOSIS — M25512 Pain in left shoulder: Secondary | ICD-10-CM | POA: Diagnosis not present

## 2017-04-11 MED ORDER — LIDOCAINE HCL 1 % IJ SOLN
3.0000 mL | INTRAMUSCULAR | Status: AC | PRN
  Administered 2017-04-11: 3 mL

## 2017-04-11 MED ORDER — BUPIVACAINE HCL 0.5 % IJ SOLN
3.0000 mL | INTRAMUSCULAR | Status: AC | PRN
  Administered 2017-04-11: 3 mL via INTRA_ARTICULAR

## 2017-04-11 MED ORDER — METHYLPREDNISOLONE ACETATE 40 MG/ML IJ SUSP
40.0000 mg | INTRAMUSCULAR | Status: AC | PRN
  Administered 2017-04-11: 40 mg via INTRA_ARTICULAR
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
## 2017-04-15 ENCOUNTER — Other Ambulatory Visit: Payer: Self-pay

## 2017-04-15 NOTE — Patient Outreach (Signed)
[facility]) Care Management  04/15/2017  Harden Bramer [birth year] [id number]   Medication Adherence call to Mr. Adel Burch  The reason for this call is because Mr. Montoya  is showing past due under [facility] Ins.on lisinopril 40 mg patient said he did not have any medication but he will pick up his medication from [place] in th next couple of days.I ask him if he want it me to order it for him and he refuse he said he will order it him self.    [facility] Management Direct Dial [phone]  Fax [phone] [email or web address]

## 2017-05-02 ENCOUNTER — Encounter (HOSPITAL_COMMUNITY): Payer: Self-pay | Admitting: Emergency Medicine

## 2017-05-02 ENCOUNTER — Emergency Department (HOSPITAL_COMMUNITY): Payer: Medicare Other

## 2017-05-02 DIAGNOSIS — R509 Fever, unspecified: Secondary | ICD-10-CM | POA: Insufficient documentation

## 2017-05-02 DIAGNOSIS — Z79899 Other long term (current) drug therapy: Secondary | ICD-10-CM | POA: Insufficient documentation

## 2017-05-02 DIAGNOSIS — E119 Type 2 diabetes mellitus without complications: Secondary | ICD-10-CM | POA: Diagnosis not present

## 2017-05-02 DIAGNOSIS — I10 Essential (primary) hypertension: Secondary | ICD-10-CM | POA: Diagnosis not present

## 2017-05-02 DIAGNOSIS — R918 Other nonspecific abnormal finding of lung field: Secondary | ICD-10-CM | POA: Diagnosis not present

## 2017-05-02 DIAGNOSIS — F17210 Nicotine dependence, cigarettes, uncomplicated: Secondary | ICD-10-CM | POA: Diagnosis not present

## 2017-05-02 LAB — COMPREHENSIVE METABOLIC PANEL
ALBUMIN: 3.4 g/dL — AB (ref 3.5–5.0)
ALT: 18 U/L (ref 17–63)
ANION GAP: 6 (ref 5–15)
AST: 13 U/L — ABNORMAL LOW (ref 15–41)
Alkaline Phosphatase: 41 U/L (ref 38–126)
BUN: 17 mg/dL (ref 6–20)
CALCIUM: 9.5 mg/dL (ref 8.9–10.3)
CO2: 24 mmol/L (ref 22–32)
CREATININE: 1.17 mg/dL (ref 0.61–1.24)
Chloride: 107 mmol/L (ref 101–111)
GFR calc Af Amer: >60 mL/min (ref >60)
GFR calc non Af Amer: >60 mL/min (ref >60)
GLUCOSE: 119 mg/dL — AB (ref 65–99)
Potassium: 4.1 mmol/L (ref 3.5–5.1)
SODIUM: 137 mmol/L (ref 135–145)
Total Bilirubin: 0.8 mg/dL (ref 0.3–1.2)
Total Protein: 6.8 g/dL (ref 6.5–8.1)

## 2017-05-02 LAB — CBC WITH DIFFERENTIAL/PLATELET
BASOS PCT: 0 %
Basophils Absolute: 0 10*3/uL (ref 0.0–0.1)
EOS PCT: 0 %
Eosinophils Absolute: 0 10*3/uL (ref 0.0–0.7)
HEMATOCRIT: 41.2 % (ref 39.0–52.0)
Hemoglobin: 13.7 g/dL (ref 13.0–17.0)
Lymphocytes Relative: 10 %
Lymphs Abs: 0.7 10*3/uL (ref 0.7–4.0)
MCH: 29.3 pg (ref 26.0–34.0)
MCHC: 33.3 g/dL (ref 30.0–36.0)
MCV: 88 fL (ref 78.0–100.0)
MONO ABS: 0.7 10*3/uL (ref 0.1–1.0)
MONOS PCT: 9 %
NEUTROS ABS: 6 10*3/uL (ref 1.7–7.7)
Neutrophils Relative %: 81 %
Platelets: 105 10*3/uL — ABNORMAL LOW (ref 150–400)
RBC: 4.68 MIL/uL (ref 4.22–5.81)
RDW: 15.3 % (ref 11.5–15.5)
WBC: 7.4 10*3/uL (ref 4.0–10.5)

## 2017-05-02 LAB — URINALYSIS, ROUTINE W REFLEX MICROSCOPIC
Bilirubin Urine: NEGATIVE
Glucose, UA: NEGATIVE mg/dL
HGB URINE DIPSTICK: NEGATIVE
Ketones, ur: NEGATIVE mg/dL
Leukocytes, UA: NEGATIVE
NITRITE: NEGATIVE
Protein, ur: 30 mg/dL — AB
SPECIFIC GRAVITY, URINE: 1.032 — AB (ref 1.005–1.030)
pH: 5 (ref 5.0–8.0)

## 2017-05-02 LAB — I-STAT CG4 LACTIC ACID, ED: LACTIC ACID, VENOUS: 1.07 mmol/L (ref 0.5–1.9)

## 2017-05-02 NOTE — ED Triage Notes (Signed)
Pt reports fever since yesterday, only reports HA and nausea. Denies V/D, SOB, cough, CP, GI/GU S/S.

## 2017-05-03 ENCOUNTER — Emergency Department (HOSPITAL_COMMUNITY)
Admission: EM | Admit: 2017-05-03 | Discharge: 2017-05-03 | Disposition: A | Discharge status: Home / Self Care | Payer: Medicare Other | Attending: Emergency Medicine | Admitting: Emergency Medicine

## 2017-05-03 DIAGNOSIS — R509 Fever, unspecified: Secondary | ICD-10-CM

## 2017-05-03 NOTE — ED Provider Notes (Addendum)
[facility] DEPT Provider Note   CSN: [id number] Arrival date & time: 05/02/17  2027     History   Chief Complaint Chief Complaint  Patient presents with  . Fever    HPI Bradley [name] is a 66 y.o. male.  Patient is a 66 year old male with history of diabetes and hypercholesterolemia presenting for evaluation of fever. He reports for the past 2 days he has been feeling "warm on the inside". He reports generalized malaise. He denies any specific complaints such as cough, sore throat, earache, vomiting, diarrhea, urinary symptoms, or other symptoms that would explain the fever. He denies any ill contacts.   The history is provided by the patient.  Fever   This is a new problem. The current episode started 2 days ago. The problem occurs constantly. The problem has not changed since onset.His temperature was unmeasured prior to arrival. Pertinent negatives include no chest pain, no diarrhea, no vomiting, no congestion, no headaches, no sore throat and no cough. He has tried nothing for the symptoms.    Past Medical History:  Diagnosis Date  . Bipolar 2 disorder ([place]) 2005   Also carries diagnosis of schizophrenia.  . Colon adenomas 2009, 2015  . Diabetes mellitus without complication ([place])   . Hyperlipemia   . Hypertension   . Hypogonadism male    Erectile dysfunction  . Lower GI bleed 07/2015  . 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      Patient Active Problem List   Diagnosis Date Noted  . Lower GI bleed 07/26/2015  . Acute lower GI bleeding 07/26/2015  . Hematochezia 07/26/2015  . Bipolar I disorder ([place])   . HTN (hypertension), benign   . Diverticulitis of large intestine with abscess without bleeding   . Essential hypertension   . Colonic diverticular abscess   . Diverticulitis 10/13/2014  . Abscess 10/13/2014  . Erectile dysfunction 01/22/2014  . Tobacco abuse 01/22/2014  . Thrombocytopenia, unspecified ([place])  06/17/2013  . Hyperlipemia   . Bipolar 2 disorder [facility])     Past Surgical History:  Procedure Laterality Date  . COLONOSCOPY  2009, 2015  . ENTEROSCOPY N/A 07/28/2015   Procedure: ENTEROSCOPY;  Surgeon: Ladene Artist, MD;  Location: [facility] ENDOSCOPY;  Service: Endoscopy;  Laterality: N/A;       Home Medications    Prior to Admission medications   Medication Sig Start Date End Date Taking? Authorizing Provider  acetaminophen (TYLENOL) 500 MG tablet Take 1 tablet (500 mg total) by mouth every 6 (six) hours as needed for mild pain or moderate pain. 03/28/17   Fawze, Mina A, PA-C  albuterol (PROVENTIL HFA;VENTOLIN HFA) 108 (90 Base) MCG/ACT inhaler Inhale 1-2 puffs into the lungs every 6 (six) hours as needed for wheezing or shortness of breath. 03/07/17   Rancour, Annie Main, MD  amLODipine (NORVASC) 10 MG tablet Take 10 mg by mouth daily.    [provider]  divalproex (DEPAKOTE ER) 500 MG 24 hr tablet Take 1,000 mg by mouth at bedtime.    [provider]  ibuprofen (ADVIL,MOTRIN) 600 MG tablet Take 1 tablet (600 mg total) by mouth every 6 (six) hours as needed for mild pain or moderate pain. 03/28/17   Fawze, Mina A, PA-C  lisinopril (PRINIVIL,ZESTRIL) 40 MG tablet Take 40 mg by mouth daily.    [provider]  naproxen (NAPROSYN) 500 MG tablet Take 1 tablet (500 mg total) by mouth 2 (two) times daily. 03/07/17   Rancour,  Annie Main, MD  pravastatin (PRAVACHOL) 80 MG tablet Take 80 mg by mouth every evening.     [provider]  risperidone (RISPERDAL) 4 MG tablet Take 4 mg by mouth at bedtime.     [provider]    Family History Family History  Problem Relation Age of Onset  . Cancer Brother        abdominal CA  . Colon cancer Neg Hx     Social History Social History  Substance Use Topics  . Smoking status: Current Every Day Smoker    Packs/day: 1.00    Years: 0.00    Types: Cigarettes  . Smokeless tobacco: Never Used  . Alcohol use No      Allergies   Patient has no known allergies.   Review of Systems Review of Systems  Constitutional: Positive for fever.  HENT: Negative for congestion and sore throat.   Respiratory: Negative for cough.   Cardiovascular: Negative for chest pain.  Gastrointestinal: Negative for diarrhea and vomiting.  Neurological: Negative for headaches.  All other systems reviewed and are negative.    Physical Exam Updated Vital Signs BP (!) 157/71 (BP Location: Right Arm)   Pulse 70   Temp 99 F (37.2 C) (Oral)   Resp 18   Ht 5\' 8"  (1.727 m)   Wt 104.3 kg (230 lb)   SpO2 96%   BMI 34.97 kg/m   Physical Exam  Constitutional: He is oriented to person, place, and time. He appears well-developed and well-nourished. No distress.  HENT:  Head: Normocephalic and atraumatic.  Mouth/Throat: Oropharynx is clear and moist.  TMs are clear bilaterally.  Neck: Normal range of motion. Neck supple.  Cardiovascular: Normal rate and regular rhythm.  Exam reveals no friction rub.   No murmur heard. Pulmonary/Chest: Effort normal and breath sounds normal. No respiratory distress. He has no wheezes. He has no rales.  Abdominal: Soft. Bowel sounds are normal. He exhibits no distension. There is no tenderness.  Musculoskeletal: Normal range of motion. He exhibits no edema.  Neurological: He is alert and oriented to person, place, and time. Coordination normal.  Skin: Skin is warm and dry. He is not diaphoretic.  Nursing note and vitals reviewed.    ED Treatments / Results  Labs (all labs ordered are listed, but only abnormal results are displayed) Labs Reviewed  COMPREHENSIVE METABOLIC PANEL - Abnormal; Notable for the following:       Result Value   Glucose, Bld 119 (*)    Albumin 3.4 (*)    AST 13 (*)    All other components within normal limits  CBC WITH DIFFERENTIAL/PLATELET - Abnormal; Notable for the following:    Platelets 105 (*)    All other components within normal limits   URINALYSIS, ROUTINE W REFLEX MICROSCOPIC - Abnormal; Notable for the following:    Color, Urine AMBER (*)    Specific Gravity, Urine 1.032 (*)    Protein, ur 30 (*)    Bacteria, UA RARE (*)    Squamous Epithelial / LPF 0-5 (*)    All other components within normal limits  I-STAT CG4 LACTIC ACID, ED  I-STAT CG4 LACTIC ACID, ED    EKG  EKG Interpretation None       Radiology Dg Chest 2 View  Result Date: 05/02/2017 CLINICAL DATA:  Fever since yesterday. EXAM: CHEST  2 VIEW COMPARISON:  03/06/2017 FINDINGS: Borderline cardiomegaly. Minimal aortic atherosclerosis. Stable bibasilar atelectasis and/or scarring. No pneumonic consolidation or effusion. No overt  pulmonary edema. Tiny 4-5 mm nodular density overlying the right upper lobe may represent a branch point for pulmonary vessels. A tiny pulmonary nodule is not excluded. IMPRESSION: No active cardiopulmonary disease.  Bibasilar atelectasis. Tiny 4-5 mm rounded nodular density in the right upper lobe. Findings may represent a branch point for pulmonary vessels versus small nodule. For further clarification, a nonemergent chest CT could be performed. Electronically Signed   By: Ashley Royalty M.D.   On: 05/02/2017 20:51    Procedures Procedures (including critical care time)  Medications Ordered in ED Medications - No data to display   Initial Impression / Assessment and Plan / ED Course  I have reviewed the triage vital signs and the nursing notes.  Pertinent labs & imaging results that were available during my care of the patient were reviewed by me and considered in my medical decision making (see chart for details).  Patient presents with complaints of subjective fevers at home. His temp here is 99.4 with no white count and physical exam is nonfocal. He has a normal lactate, normal vital signs, and otherwise appears well. I am uncertain as to the exact etiology of his symptoms, however I suspect a viral etiology. He will be  discharged with rotating Tylenol and ibuprofen. He is to return as needed if he worsens.  Final Clinical Impressions(s) / ED Diagnoses   Final diagnoses:  None    New Prescriptions New Prescriptions   No medications on file     Veryl Speak, MD 05/03/17 1610    Veryl Speak, MD 05/03/17 0130

## 2017-05-03 NOTE — Discharge Instructions (Signed)
Tylenol 1000 mg rotated with ibuprofen 600 mg every 4 hours as needed for pain or fever.  Return to the emergency department if you develop difficulty breathing, severe abdominal pain, or other new and concerning symptoms.

## 2017-05-06 ENCOUNTER — Ambulatory Visit (INDEPENDENT_AMBULATORY_CARE_PROVIDER_SITE_OTHER): Payer: Medicare Other | Admitting: Podiatry

## 2017-05-06 ENCOUNTER — Encounter: Payer: Self-pay | Admitting: Podiatry

## 2017-05-06 VITALS — BP 168/100 | HR 68

## 2017-05-06 DIAGNOSIS — L84 Corns and callosities: Secondary | ICD-10-CM

## 2017-05-06 DIAGNOSIS — B351 Tinea unguium: Secondary | ICD-10-CM

## 2017-05-06 NOTE — Progress Notes (Signed)
   Subjective:    Patient ID: Bradley [name], male    DOB: 1951/07/13, 66 y.o.   MRN: [id number]  HPI Patient presents with approximate 6 month history of callus like tissue on the plantar aspect of the right and left heels. He is noticed over time the areas of thickened. Patient denies any professional treatment of home treatment Patient states that he was diagnosed with diabetes but denies taking medication for the past year He denies history of open sores, claudication or amputation Patient admits to smoking a pack assist daily   Review of Systems  All other systems reviewed and are negative.      Objective:   Physical Exam  Patient appears orientated 3  Vascular: Mild peripheral pitting edema bilaterally DP pulses 2/4 bilaterally PT pulses 1/4 bilaterally Reflux within normal limits bilaterally  Neurological: Sensation to 10 g monofilament wire intact 5/5 bilaterally Vibratory sensation reactive bilaterally Ankle reflex reactive bilaterally  Dermatological: No open skin lesions bilaterally Elongated, discolored, deformed toenails 6-10 Macerated plantar callus first MPJ bilaterally Macerated hyperkeratotic tissue plantar heels bilaterally Patient is not wearing socks  Musculoskeletal: Manual motor testing dorsi flexion, plantar flexion, inversion, eversion 5/5 bilaterally       Assessment & Plan:   Assessment: History of diabetes without current medication Protective sensation intact bilaterally Decreased posterior tibial pulses bilaterally Hyperkeratotic tissue plantar first MPJ and heel bilaterally Some macerated plantar skin associated possibly with hyperhidrosis Mycotic toenails 6-10  Plan: Reviewed the results of exam with patient today Instructed patient to wear socks Recommended he use a pumice stone or callus file to gently sitting and the thickened skin after showering or bathing If he does not notice enough reduction of the thick skin I  recommended over-the-counter salicylic acid Debridement of mycotic toenails 6-10 without any bleeding  Reappoint at patient's request

## 2017-05-06 NOTE — Patient Instructions (Addendum)
Purchase a callus file or pumice stone and sand calluses on your heels after showering or bathing If he calluses very thick okay to use the salicylic acid plaster Wear cotton socks to absorb moisture   Salicylic Acid topical plaster What is this medicine? SALICYCLIC ACID (SAL i SIL ik AS id) breaks down layers of thick skin. It is used to treat common and plantar warts, psoriasis, calluses, and corns. This medicine may be used for other purposes; ask your health care provider or pharmacist if you have questions. COMMON BRAND NAME(S): Clear Away, Clear Away One Step, Clear Away Plantar, Compound W, Curad Mediplast, Dr. Felicie Morn Callus Removers, Dr. Felicie Morn Extra Thick Callus Remover, Dr. Felicie Morn One Step Callus Remover, MOSCO One Step Corn Remover, Trans-Ver-Sal What should I tell my health care provider before I take this medicine? They need to know if you have any of these conditions: -child with chickenpox, the flu, or other viral infection -kidney disease -liver disease -an unusual or allergic reaction to salicylic acid, other medicines, foods, dyes, or preservatives -pregnant or trying to get pregnant -breast-feeding How should I use this medicine? This medicine is for external use only. Follow the directions on the label. Do not apply to raw or irritated skin. Avoid getting medicine in your eyes, lips, nose, mouth, or other sensitive areas. Use this medicine at regular intervals. Do not use more often than directed. Talk to your pediatrician regarding the use of this medicine in children. Special care may be needed. This medicine is not approved for use in children under 87 years old. Overdosage: If you think you have taken too much of this medicine contact a poison control center or emergency room at once. NOTE: This medicine is only for you. Do not share this medicine with others. What if I miss a dose? If you miss a dose, use it as soon as you can. If it is almost time for your  next dose, use only that dose. Do not use double or extra doses. What may interact with this medicine? -medicines that change urine pH like sodium bicarbonate, ammonium chloride and others -medicines that treat or prevent blood clots like warfarin -methotrexate -pyrazinamide -some medicines for diabetes -some medicines for gout -steroid medicines like prednisone or cortisone This list may not describe all possible interactions. Give your health care provider a list of all the medicines, herbs, non-prescription drugs, or dietary supplements you use. Also tell them if you smoke, drink alcohol, or use illegal drugs. Some items may interact with your medicine. What should I watch for while using this medicine? Tell your doctor or healthcare professional if your symptoms do not start to get better or if they get worse. This medicine can make you more sensitive to the sun. Keep out of the sun. If you cannot avoid being in the sun, wear protective clothing and use sunscreen. Do not use sun lamps or tanning beds/booths. Use of this medicine in children under 12 years or in patients with kidney or liver disease may increase the risk of serious side effects. These patients should not use this medicine over large areas of skin. If you notice symptoms such as nausea, vomiting, dizziness, loss of hearing, ringing in the ears, unusual weakness or tiredness, fast or labored breathing, diarrhea, or confusion, stop using this medicine and contact your doctor or health care professional. What side effects may I notice from receiving this medicine? Side effects that you should report to your doctor or health care professional as  soon as possible: -allergic reactions like skin rash, itching or hives, swelling of the face, lips, or tongue Side effects that usually do not require medical attention (report to your doctor or health care professional if they continue or are bothersome): -skin irritation This list may not  describe all possible side effects. Call your doctor for medical advice about side effects. You may report side effects to FDA at 1-800-FDA-1088. Where should I keep my medicine? Keep out of the reach of children. Store at room temperature between 15 and 30 degrees C (59 and 86 degrees F). Do not freeze. Throw away any unused medicine after the expiration date. NOTE: This sheet is a summary. It may not cover all possible information. If you have questions about this medicine, talk to your doctor, pharmacist, or health care provider.  2018 Elsevier/Gold Standard (2008-04-09 13:38:33)   Corns and Calluses Corns are small areas of thickened skin that occur on the top, sides, or tip of a toe. They contain a cone-shaped core with a point that can press on a nerve below. This causes pain. Calluses are areas of thickened skin that can occur anywhere on the body including hands, fingers, palms, soles of the feet, and heels.Calluses are usually larger than corns. What are the causes? Corns and calluses are caused by rubbing (friction) or pressure, such as from shoes that are too tight or do not fit properly. What increases the risk? Corns are more likely to develop in people who have toe deformities, such as hammer toes. Since calluses can occur with friction to any area of the skin, calluses are more likely to develop in people who:  Work with their hands.  Wear shoes that fit poorly, shoes that are too tight, or shoes that are high-heeled.  Have toes deformities.  What are the signs or symptoms? Symptoms of a corn or callus include:  A hard growth on the skin.  Pain or tenderness under the skin.  Redness and swelling.  Increased discomfort while wearing tight-fitting shoes.  How is this diagnosed? Corns and calluses may be diagnosed with a medical history and physical exam. How is this treated? Corns and calluses may be treated with:  Removing the cause of the friction or pressure.  This may include: ? Changing your shoes. ? Wearing shoe inserts (orthotics) or other protective layers in your shoes, such as a corn pad. ? Wearing gloves.  Medicines to help soften skin in the hardened, thickened areas.  Reducing the size of the corn or callus by removing the dead layers of skin.  Antibiotic medicines to treat infection.  Surgery, if a toe deformity is the cause.  Follow these instructions at home:  Take medicines only as directed by your health care provider.  If you were prescribed an antibiotic, finish all of it even if you start to feel better.  Wear shoes that fit well. Avoid wearing high-heeled shoes and shoes that are too tight or too loose.  Wear any padding, protective layers, gloves, or orthotics as directed by your health care provider.  Soak your hands or feet and then use a file or pumice stone to soften your corn or callus. Do this as directed by your health care provider.  Check your corn or callus every day for signs of infection. Watch for: ? Redness, swelling, or pain. ? Fluid, blood, or pus. Contact a health care provider if:  Your symptoms do not improve with treatment.  You have increased redness, swelling, or pain  at the site of your corn or callus.  You have fluid, blood, or pus coming from your corn or callus.  You have new symptoms. This information is not intended to replace advice given to you by your health care provider. Make sure you discuss any questions you have with your health care provider. Document Released: 05/12/2004 Document Revised: 02/24/2016 Document Reviewed: 08/02/2014 Elsevier Interactive Patient Education  [employer].

## 2017-07-15 DIAGNOSIS — Z7689 Persons encountering health services in other specified circumstances: Secondary | ICD-10-CM | POA: Diagnosis not present

## 2017-07-15 DIAGNOSIS — I10 Essential (primary) hypertension: Secondary | ICD-10-CM | POA: Diagnosis not present

## 2017-07-19 DIAGNOSIS — E559 Vitamin D deficiency, unspecified: Secondary | ICD-10-CM | POA: Diagnosis not present

## 2017-07-19 DIAGNOSIS — E785 Hyperlipidemia, unspecified: Secondary | ICD-10-CM | POA: Diagnosis not present

## 2017-07-19 DIAGNOSIS — E119 Type 2 diabetes mellitus without complications: Secondary | ICD-10-CM | POA: Diagnosis not present

## 2017-07-19 DIAGNOSIS — I10 Essential (primary) hypertension: Secondary | ICD-10-CM | POA: Diagnosis not present

## 2017-07-20 DIAGNOSIS — I10 Essential (primary) hypertension: Secondary | ICD-10-CM | POA: Diagnosis not present

## 2017-07-20 DIAGNOSIS — E559 Vitamin D deficiency, unspecified: Secondary | ICD-10-CM | POA: Diagnosis not present

## 2017-07-20 DIAGNOSIS — E119 Type 2 diabetes mellitus without complications: Secondary | ICD-10-CM | POA: Diagnosis not present

## 2017-07-20 DIAGNOSIS — E785 Hyperlipidemia, unspecified: Secondary | ICD-10-CM | POA: Diagnosis not present

## 2017-08-26 DIAGNOSIS — I10 Essential (primary) hypertension: Secondary | ICD-10-CM | POA: Diagnosis not present

## 2017-08-26 DIAGNOSIS — R7303 Prediabetes: Secondary | ICD-10-CM | POA: Diagnosis not present

## 2017-08-26 DIAGNOSIS — E559 Vitamin D deficiency, unspecified: Secondary | ICD-10-CM | POA: Diagnosis not present

## 2018-02-06 DIAGNOSIS — H25013 Cortical age-related cataract, bilateral: Secondary | ICD-10-CM | POA: Diagnosis not present

## 2018-02-06 DIAGNOSIS — E119 Type 2 diabetes mellitus without complications: Secondary | ICD-10-CM | POA: Diagnosis not present

## 2018-02-06 DIAGNOSIS — H40013 Open angle with borderline findings, low risk, bilateral: Secondary | ICD-10-CM | POA: Diagnosis not present

## 2018-02-06 DIAGNOSIS — H2513 Age-related nuclear cataract, bilateral: Secondary | ICD-10-CM | POA: Diagnosis not present

## 2018-02-07 DIAGNOSIS — E119 Type 2 diabetes mellitus without complications: Secondary | ICD-10-CM | POA: Diagnosis not present

## 2018-02-07 DIAGNOSIS — I10 Essential (primary) hypertension: Secondary | ICD-10-CM | POA: Diagnosis not present

## 2018-02-07 DIAGNOSIS — E559 Vitamin D deficiency, unspecified: Secondary | ICD-10-CM | POA: Diagnosis not present

## 2018-02-07 DIAGNOSIS — Z125 Encounter for screening for malignant neoplasm of prostate: Secondary | ICD-10-CM | POA: Diagnosis not present

## 2018-02-07 DIAGNOSIS — E785 Hyperlipidemia, unspecified: Secondary | ICD-10-CM | POA: Diagnosis not present

## 2018-02-07 DIAGNOSIS — Z720 Tobacco use: Secondary | ICD-10-CM | POA: Diagnosis not present

## 2018-02-09 DIAGNOSIS — E559 Vitamin D deficiency, unspecified: Secondary | ICD-10-CM | POA: Diagnosis present

## 2018-03-22 IMAGING — CR DG SHOULDER 2+V*L*
3 series · 3 of 3 positions shown · non-contrast
Comparison: Chest x-ray 01/05/2017

CLINICAL DATA: Posterior left shoulder pain worse with movement. No
injury.

EXAM:
LEFT SHOULDER - 2+ VIEW

[shoulder grashey]
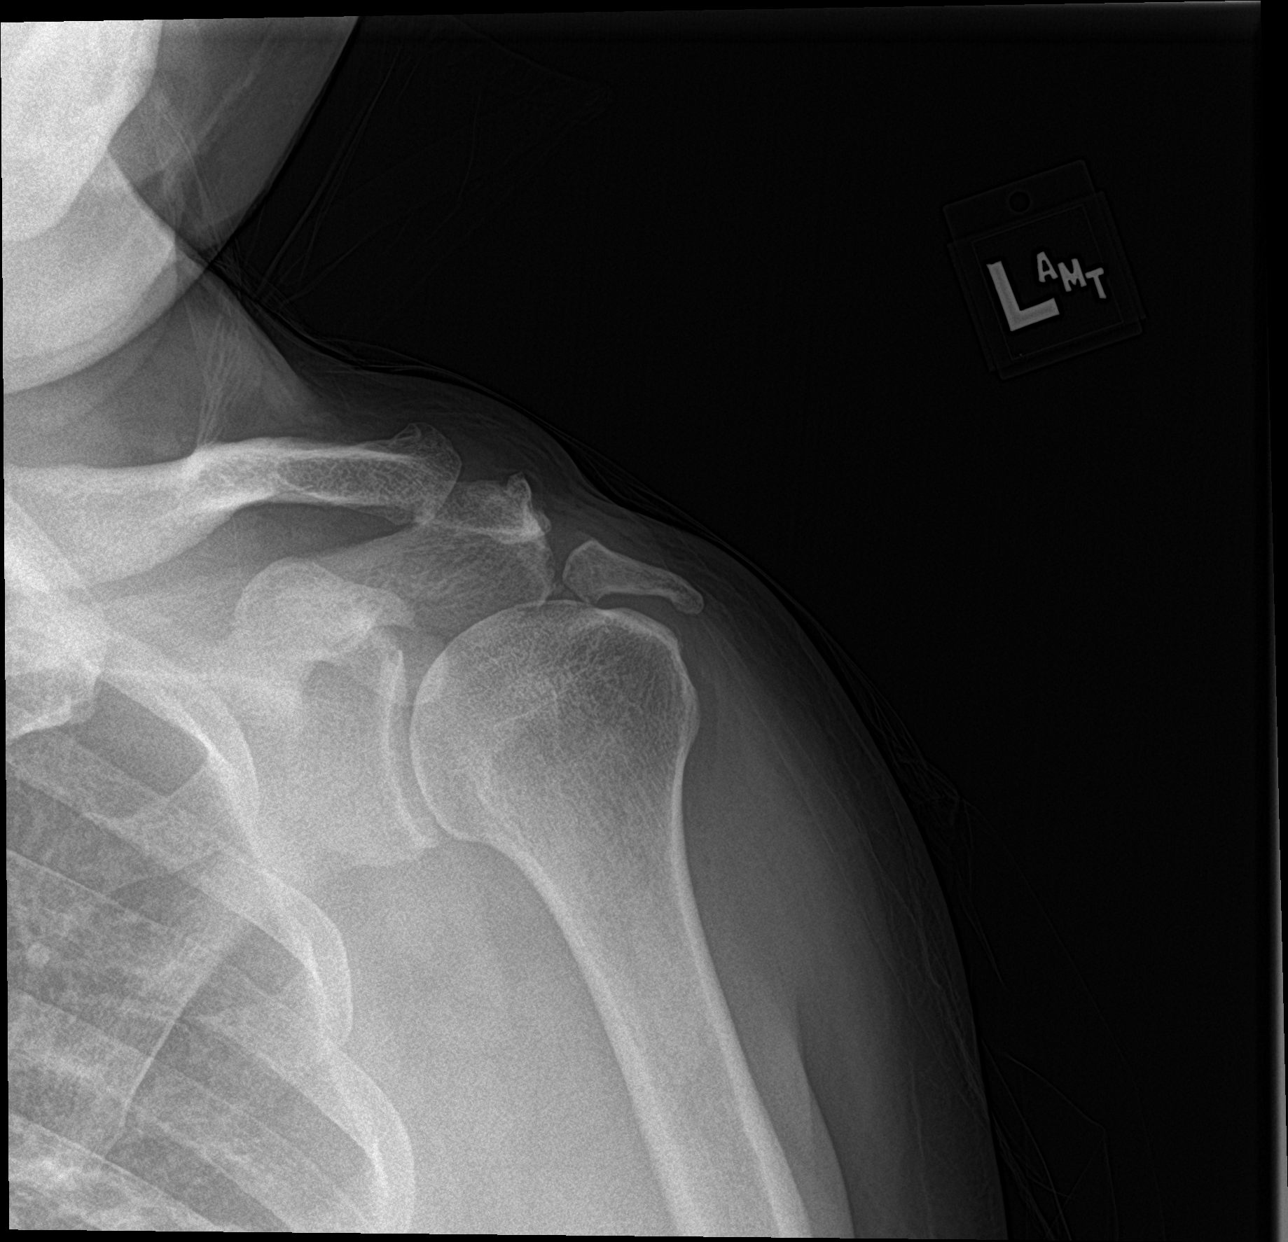

[shoulder y view]
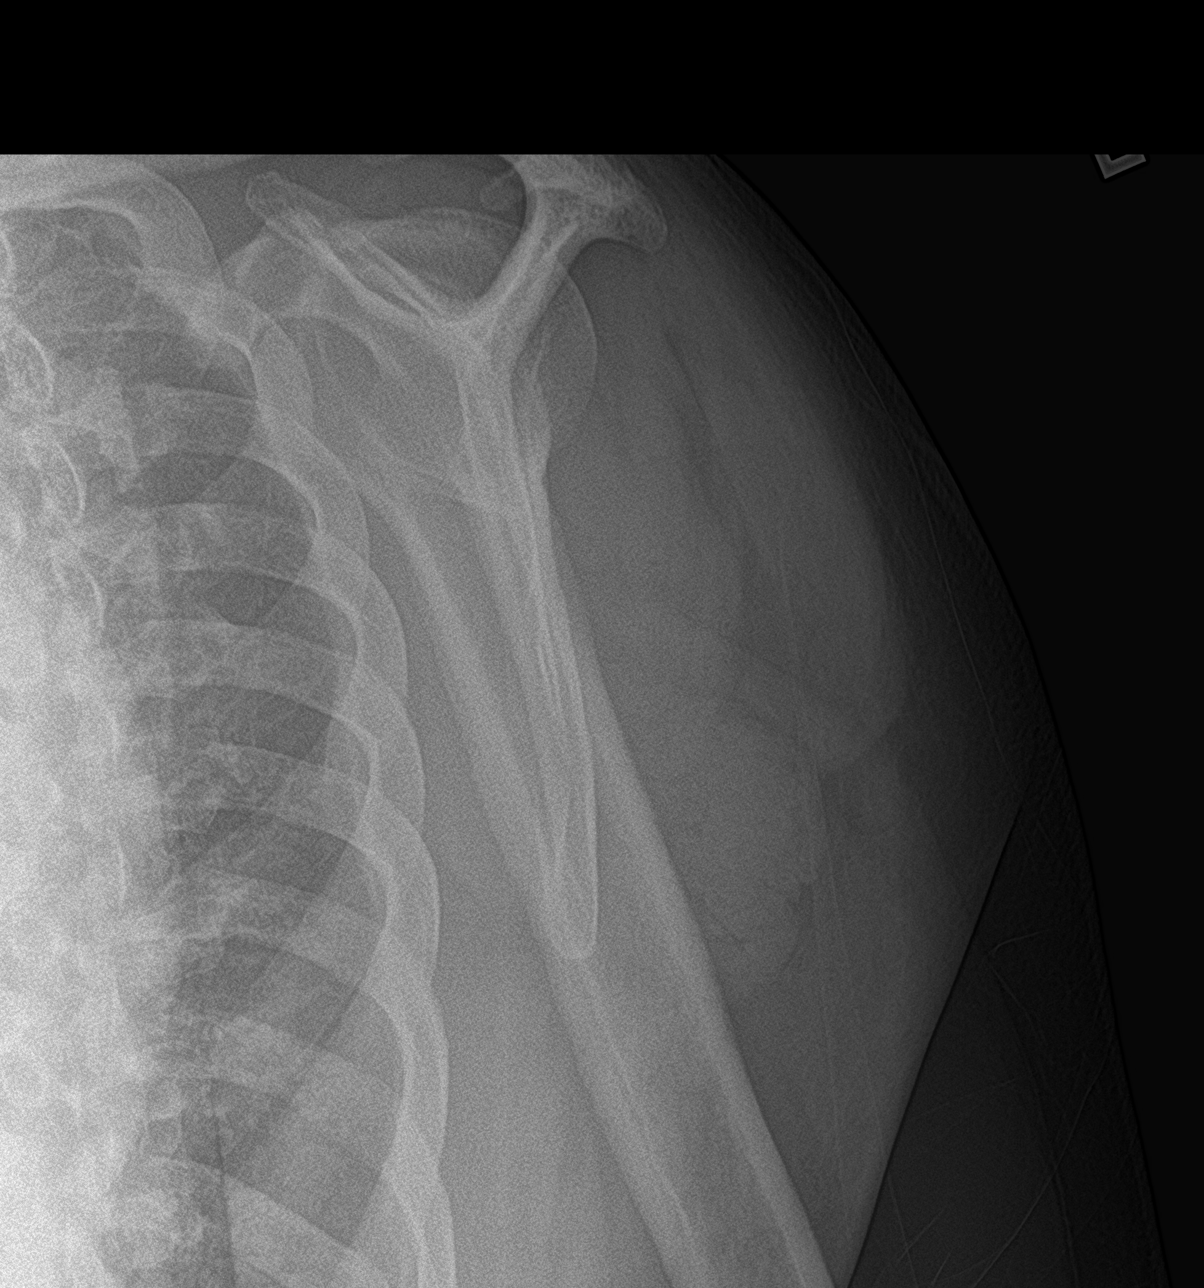

[shoulder axillary]
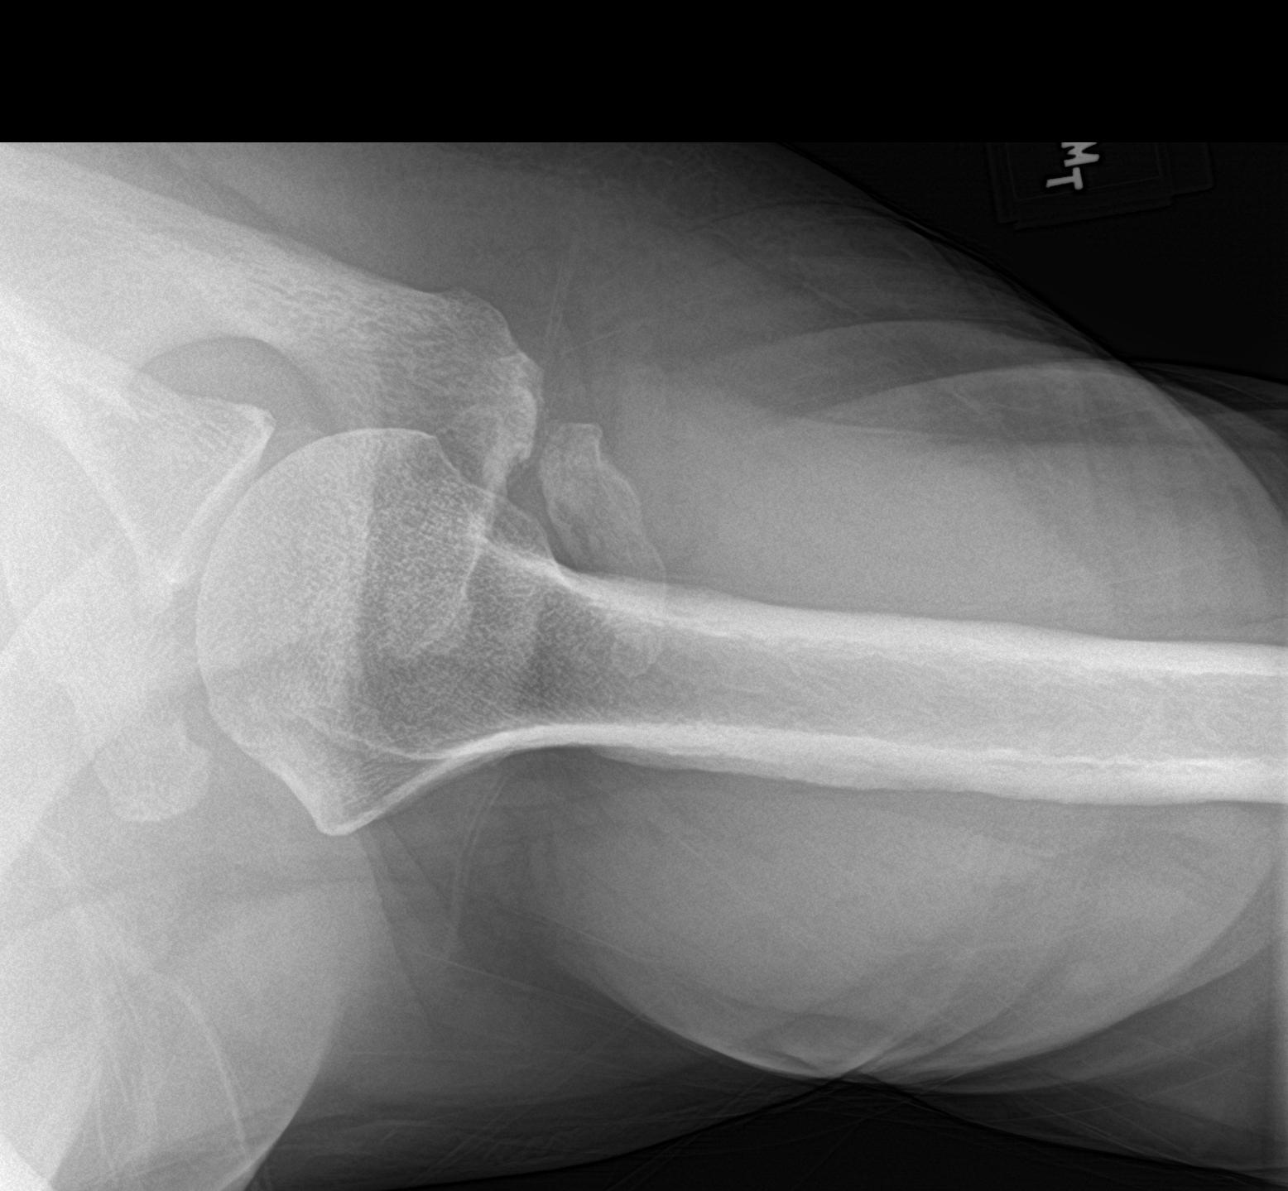

[3 of 3 positions shown; findings below may reference images not displayed]

FINDINGS: Mild degenerative change of the AC joint. No significant
degenerative change of the glenohumeral joint. No evidence of
fracture dislocation. Smooth well corticated 4.1 cm fragment just
superior to the humeral head and abutting the lateral surface of the
scapula at the AC joint compatible with a chronic finding.
IMPRESSION: No acute findings.

Mild degenerative change of the AC joint. 4.1 cm chronic fragment
lateral to the scapula and immediately superior to the humeral head.

## 2018-04-20 ENCOUNTER — Encounter (HOSPITAL_COMMUNITY): Payer: Self-pay | Admitting: Emergency Medicine

## 2018-04-20 ENCOUNTER — Emergency Department (HOSPITAL_COMMUNITY)
Admission: EM | Admit: 2018-04-20 | Discharge: 2018-04-21 | Disposition: A | Discharge status: Home / Self Care | Payer: Medicare HMO | Attending: Emergency Medicine | Admitting: Emergency Medicine

## 2018-04-20 ENCOUNTER — Other Ambulatory Visit: Payer: Self-pay

## 2018-04-20 DIAGNOSIS — R062 Wheezing: Secondary | ICD-10-CM | POA: Insufficient documentation

## 2018-04-20 DIAGNOSIS — K5900 Constipation, unspecified: Secondary | ICD-10-CM | POA: Insufficient documentation

## 2018-04-20 DIAGNOSIS — R1084 Generalized abdominal pain: Secondary | ICD-10-CM | POA: Insufficient documentation

## 2018-04-20 NOTE — ED Triage Notes (Signed)
Pt reports R sided abd pain X 1 day with abd distention. Denies N/V/D.

## 2018-04-21 ENCOUNTER — Emergency Department (HOSPITAL_COMMUNITY): Payer: Medicare HMO

## 2018-04-21 LAB — COMPREHENSIVE METABOLIC PANEL
ALBUMIN: 3.7 g/dL (ref 3.5–5.0)
ALT: 23 U/L (ref 0–44)
ANION GAP: 9 (ref 5–15)
AST: 17 U/L (ref 15–41)
Alkaline Phosphatase: 45 U/L (ref 38–126)
BILIRUBIN TOTAL: 0.8 mg/dL (ref 0.3–1.2)
BUN: 14 mg/dL (ref 8–23)
CO2: 26 mmol/L (ref 22–32)
Calcium: 8.8 mg/dL — ABNORMAL LOW (ref 8.9–10.3)
Chloride: 102 mmol/L (ref 98–111)
Creatinine, Ser: 1.13 mg/dL (ref 0.61–1.24)
GFR calc Af Amer: >60 mL/min (ref >60)
GLUCOSE: 151 mg/dL — AB (ref 70–99)
Potassium: 4.2 mmol/L (ref 3.5–5.1)
Sodium: 137 mmol/L (ref 135–145)
TOTAL PROTEIN: 7.5 g/dL (ref 6.5–8.1)

## 2018-04-21 LAB — URINALYSIS, ROUTINE W REFLEX MICROSCOPIC
Bilirubin Urine: NEGATIVE
Glucose, UA: NEGATIVE mg/dL
Hgb urine dipstick: NEGATIVE
Ketones, ur: NEGATIVE mg/dL
LEUKOCYTES UA: NEGATIVE
NITRITE: NEGATIVE
PH: 5 (ref 5.0–8.0)
Protein, ur: NEGATIVE mg/dL
SPECIFIC GRAVITY, URINE: 1.024 (ref 1.005–1.030)

## 2018-04-21 LAB — CBC
HEMATOCRIT: 47.8 % (ref 39.0–52.0)
HEMOGLOBIN: 14.8 g/dL (ref 13.0–17.0)
MCH: 29.1 pg (ref 26.0–34.0)
MCHC: 31 g/dL (ref 30.0–36.0)
MCV: 93.9 fL (ref 78.0–100.0)
Platelets: 143 10*3/uL — ABNORMAL LOW (ref 150–400)
RBC: 5.09 MIL/uL (ref 4.22–5.81)
RDW: 15.2 % (ref 11.5–15.5)
WBC: 6.5 10*3/uL (ref 4.0–10.5)

## 2018-04-21 LAB — LIPASE, BLOOD: Lipase: 45 U/L (ref 11–51)

## 2018-04-21 MED ORDER — FENTANYL CITRATE (PF) 100 MCG/2ML IJ SOLN
50.0000 ug | Freq: Once | INTRAMUSCULAR | Status: AC
  Administered 2018-04-21: 50 ug via INTRAVENOUS
  Filled 2018-04-21: qty 2

## 2018-04-21 MED ORDER — ALBUTEROL SULFATE (2.5 MG/3ML) 0.083% IN NEBU
5.0000 mg | INHALATION_SOLUTION | Freq: Once | RESPIRATORY_TRACT | Status: AC
  Administered 2018-04-21: 5 mg via RESPIRATORY_TRACT
  Filled 2018-04-21: qty 6

## 2018-04-21 MED ORDER — DOCUSATE SODIUM 100 MG PO CAPS: 100.0000 mg | ORAL_CAPSULE | Freq: Two times a day (BID) | ORAL | 0 refills | Status: DC

## 2018-04-21 MED ORDER — ONDANSETRON HCL 4 MG/2ML IJ SOLN
4.0000 mg | Freq: Once | INTRAMUSCULAR | Status: AC
  Administered 2018-04-21: 4 mg via INTRAVENOUS
  Filled 2018-04-21: qty 2

## 2018-04-21 MED ORDER — IPRATROPIUM BROMIDE 0.02 % IN SOLN
0.5000 mg | Freq: Once | RESPIRATORY_TRACT | Status: AC
  Administered 2018-04-21: 0.5 mg via RESPIRATORY_TRACT
  Filled 2018-04-21: qty 2.5

## 2018-04-21 NOTE — ED Notes (Signed)
Pt discharged from ED; instructions provided and scripts given; Pt encouraged to return to ED if symptoms worsen and to f/u with PCP; Pt verbalized understanding of all instructions 

## 2018-04-21 NOTE — ED Provider Notes (Signed)
[facility] EMERGENCY DEPARTMENT Provider Note   CSN: [id number] Arrival date & time: 04/20/18  2246     History   Chief Complaint Chief Complaint  Patient presents with  . Abdominal Pain    HPI Bradley [name] is a 67 y.o. male.  The history is provided by the patient.  Abdominal Pain   This is a new problem. The current episode started 12 to 24 hours ago. The problem occurs constantly. The problem has been gradually improving. Pain location: right sided. The pain is moderate. Pertinent negatives include fever, diarrhea, hematochezia, melena, nausea, vomiting, constipation and dysuria. The symptoms are aggravated by certain positions. Nothing relieves the symptoms.   Patient history of bipolar, diabetes, hypertension presents with abdominal pain for about a day.  Reports is mostly right-sided.  Nursing reported abdominal distention, but patient denies this.  No chest pain or shortness of breath. He is now feeling improved. No history of abdominal surgeries per patient Past Medical History:  Diagnosis Date  . Bipolar 2 disorder ([place]) 2005   Also carries diagnosis of schizophrenia.  . Colon adenomas 2009, 2015  . Diabetes mellitus without complication ([place])   . Hyperlipemia   . Hypertension   . Hypogonadism male    Erectile dysfunction  . Lower GI bleed 07/2015  . 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      Patient Active Problem List   Diagnosis Date Noted  . Lower GI bleed 07/26/2015  . Acute lower GI bleeding 07/26/2015  . Hematochezia 07/26/2015  . Bipolar I disorder ([place])   . HTN (hypertension), benign   . Diverticulitis of large intestine with abscess without bleeding   . Essential hypertension   . Colonic diverticular abscess   . Diverticulitis 10/13/2014  . Abscess 10/13/2014  . Erectile dysfunction 01/22/2014  . Tobacco abuse 01/22/2014  . Thrombocytopenia, unspecified ([place]) 06/17/2013  .  Hyperlipemia   . Bipolar 2 disorder [facility])     Past Surgical History:  Procedure Laterality Date  . COLONOSCOPY  2009, 2015  . ENTEROSCOPY N/A 07/28/2015   Procedure: ENTEROSCOPY;  Surgeon: Ladene Artist, MD;  Location: [facility] ENDOSCOPY;  Service: Endoscopy;  Laterality: N/A;        Home Medications    Prior to Admission medications   Medication Sig Start Date End Date Taking? Authorizing Provider  acetaminophen (TYLENOL) 500 MG tablet Take 1 tablet (500 mg total) by mouth every 6 (six) hours as needed for mild pain or moderate pain. 03/28/17   Fawze, Mina A, PA-C  albuterol (PROVENTIL HFA;VENTOLIN HFA) 108 (90 Base) MCG/ACT inhaler Inhale 1-2 puffs into the lungs every 6 (six) hours as needed for wheezing or shortness of breath. 03/07/17   Rancour, Annie Main, MD  amLODipine (NORVASC) 10 MG tablet Take 10 mg by mouth daily.    [provider]  divalproex (DEPAKOTE ER) 500 MG 24 hr tablet Take 1,000 mg by mouth at bedtime.    [provider]  FLUZONE QUADRIVALENT 0.5 ML injection inject 0.5 milliliter intramuscularly 04/25/17   [provider]  furosemide (LASIX) 40 MG tablet  04/20/17   [provider]  ibuprofen (ADVIL,MOTRIN) 600 MG tablet Take 1 tablet (600 mg total) by mouth every 6 (six) hours as needed for mild pain or moderate pain. 03/28/17   Fawze, Mina A, PA-C  lisinopril (PRINIVIL,ZESTRIL) 40 MG tablet Take 40 mg by mouth daily.    [provider]  naproxen (NAPROSYN) 500 MG  tablet Take 1 tablet (500 mg total) by mouth 2 (two) times daily. 03/07/17   Rancour, Annie Main, MD  pravastatin (PRAVACHOL) 80 MG tablet Take 80 mg by mouth every evening.     [provider]  risperiDONE (RISPERDAL) 2 MG tablet  05/01/17   [provider]    Family History Family History  Problem Relation Age of Onset  . Cancer Brother        abdominal CA  . Colon cancer Neg Hx     Social History Social History   Tobacco Use  . Smoking status:  Current Every Day Smoker    Packs/day: 1.00    Years: 0.00    Pack years: 0.00    Types: Cigarettes  . Smokeless tobacco: Never Used  Substance Use Topics  . Alcohol use: No  . Drug use: No     Allergies   Patient has no known allergies.   Review of Systems Review of Systems  Constitutional: Negative for fever.  Respiratory: Negative for shortness of breath.   Cardiovascular: Negative for chest pain.  Gastrointestinal: Positive for abdominal pain. Negative for blood in stool, constipation, diarrhea, hematochezia, melena, nausea and vomiting.  Genitourinary: Negative for dysuria.  All other systems reviewed and are negative.    Physical Exam Updated Vital Signs BP (!) 153/76   Pulse 72   Temp 98.7 F (37.1 C) (Oral)   Resp 14   Ht 1.727 m (5\' 8" )   Wt 104.3 kg   SpO2 96%   BMI 34.97 kg/m   Physical Exam CONSTITUTIONAL: Elderly, no acute distress HEAD: Normocephalic/atraumatic EYES: EOMI/PERRL ENMT: Mucous membranes moist NECK: supple no meningeal signs SPINE/BACK:entire spine nontender CV: S1/S2 noted, no murmurs/rubs/gallops noted LUNGS: Wheezing bilaterally no apparent distress ABDOMEN: soft, protuberant, nontender, no rebound or guarding, scattered bowel sounds noted  GU:no cva tenderness NEURO: Pt is awake/alert/appropriate, moves all extremitiesx4.  No facial droop.   EXTREMITIES: pulses normal/equal, full ROM SKIN: warm, color normal PSYCH: no abnormalities of mood noted, alert and oriented to situation   ED Treatments / Results  Labs (all labs ordered are listed, but only abnormal results are displayed) Labs Reviewed  COMPREHENSIVE METABOLIC PANEL - Abnormal; Notable for the following components:      Result Value   Glucose, Bld 151 (*)    Calcium 8.8 (*)    All other components within normal limits  CBC - Abnormal; Notable for the following components:   Platelets 143 (*)    All other components within normal limits  LIPASE, BLOOD    URINALYSIS, ROUTINE W REFLEX MICROSCOPIC    EKG None  Radiology Dg Abd Acute W/chest  Result Date: 04/21/2018 CLINICAL DATA:  RIGHT-sided abdominal pain for 1 day. Abdominal distension. EXAM: DG ABDOMEN ACUTE W/ 1V CHEST COMPARISON:  Chest radiograph May 02, 2017 FINDINGS: Cardiomediastinal silhouette is normal. Bibasilar strandy densities similar to prior examination. RIGHT upper lobe granuloma. No pneumothorax. Soft tissue planes and included osseous structures are unremarkable. Bowel gas pattern is nondilated and nonobstructive. Moderate amount of retained large bowel stool. Mild aortoiliac calcifications. No intra-abdominal mass effect, pathologic calcifications or free air. Soft tissue planes and included osseous structures are non-suspicious. IMPRESSION: Similar bibasilar atelectasis/scarring. Moderate amount of retained large bowel stool, normal bowel gas pattern. Aortic Atherosclerosis (ICD10-I70.0). Electronically Signed   By: Elon Alas M.D.   On: 04/21/2018 03:30    Procedures Procedures   Medications Ordered in ED Medications  fentaNYL (SUBLIMAZE) injection 50 mcg (50 mcg Intravenous Given  04/21/18 0408)  ondansetron (ZOFRAN) injection 4 mg (4 mg Intravenous Given 04/21/18 0408)  albuterol (PROVENTIL) (2.5 MG/3ML) 0.083% nebulizer solution 5 mg (5 mg Nebulization Given 04/21/18 0551)  ipratropium (ATROVENT) nebulizer solution 0.5 mg (0.5 mg Nebulization Given 04/21/18 0551)     Initial Impression / Assessment and Plan / ED Course  I have reviewed the triage vital signs and the nursing notes.  Pertinent labs & imaging results that were available during my care of the patient were reviewed by me and considered in my medical decision making (see chart for details).     5:03 AM PT Is feeling improved, no focal tenderness. P.o. Challenge 6:46 AM Patient monitored for several hours and feels improved.  Repeat abdominal exam reveals no focal tenderness.  No vomiting.  He  states he feels much improved.  X-ray was reviewed, no evidence of acute bowel obstruction. He does have evidence of constipation.  We will place him on Colace.  Incidentally, he had wheezing on exam and had mild hypoxia.  Patient admits to smoking.  He was given nebulized therapy and improved, pulse ox greater than 95% on room air I told patient to quit smoking.  Otherwise he is appropriate and safe for discharge home We discussed strict ER return precautions.  Final Clinical Impressions(s) / ED Diagnoses   Final diagnoses:  Generalized abdominal pain  Wheezing  Constipation, unspecified constipation type    ED Discharge Orders         Ordered    docusate sodium (COLACE) 100 MG capsule  Every 12 hours     04/21/18 6579           Ripley Fraise, MD 04/21/18 [phone]

## 2018-04-21 NOTE — Discharge Instructions (Addendum)

## 2018-05-23 ENCOUNTER — Encounter (HOSPITAL_COMMUNITY): Payer: Self-pay | Admitting: Emergency Medicine

## 2018-05-23 ENCOUNTER — Other Ambulatory Visit: Payer: Self-pay

## 2018-05-23 ENCOUNTER — Emergency Department (HOSPITAL_COMMUNITY): Payer: No Typology Code available for payment source

## 2018-05-23 ENCOUNTER — Emergency Department (HOSPITAL_COMMUNITY)
Admission: EM | Admit: 2018-05-23 | Discharge: 2018-05-23 | Disposition: A | Discharge status: Home / Self Care | Payer: No Typology Code available for payment source | Attending: Emergency Medicine | Admitting: Emergency Medicine

## 2018-05-23 DIAGNOSIS — F17210 Nicotine dependence, cigarettes, uncomplicated: Secondary | ICD-10-CM | POA: Diagnosis not present

## 2018-05-23 DIAGNOSIS — Z79899 Other long term (current) drug therapy: Secondary | ICD-10-CM | POA: Insufficient documentation

## 2018-05-23 DIAGNOSIS — I10 Essential (primary) hypertension: Secondary | ICD-10-CM | POA: Insufficient documentation

## 2018-05-23 DIAGNOSIS — M791 Myalgia, unspecified site: Secondary | ICD-10-CM | POA: Diagnosis not present
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
## 2018-05-23 NOTE — Discharge Instructions (Signed)
Please follow up with your primary care provider within 5-7 days for re-evaluation of your symptoms. If you do not have a primary care provider, information for a healthcare clinic has been provided for you to make arrangements for follow up care. Please return to the emergency department for any new or worsening symptoms. ° °
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
## 2019-01-07 ENCOUNTER — Telehealth: Payer: Self-pay | Admitting: *Deleted

## 2019-01-07 ENCOUNTER — Other Ambulatory Visit: Payer: Self-pay

## 2019-01-07 NOTE — Telephone Encounter (Signed)
Patient was called x2 for pre-visit over the phone. No answer, left message for the patient to call me back before 5 pm today or the pv and colonoscopy would be cancelled.

## 2019-01-07 NOTE — Telephone Encounter (Signed)
Patient called back and rescheduled his PV and colonoscopy.

## 2019-01-14 ENCOUNTER — Other Ambulatory Visit: Payer: Self-pay

## 2019-01-14 ENCOUNTER — Ambulatory Visit (AMBULATORY_SURGERY_CENTER): Payer: Self-pay

## 2019-01-14 VITALS — Ht 68.0 in | Wt 240.0 lb

## 2019-01-14 DIAGNOSIS — Z86010 Personal history of colonic polyps: Secondary | ICD-10-CM

## 2019-01-14 MED ORDER — NA SULFATE-K SULFATE-MG SULF 17.5-3.13-1.6 GM/177ML PO SOLN: 1.0000 | Freq: Once | ORAL | 0 refills | Status: AC

## 2019-01-14 NOTE — Progress Notes (Signed)
Denies allergies to eggs or soy products. Denies complication of anesthesia or sedation. Denies use of weight loss medication. Denies use of O2.   Emmi instructions given for colonoscopy.   Pre-Visit was conducted by phone due to Covid 19. Instructions were reviewed with patient and mailed to his new confirmed address. Patient was very hard to understand during the visit. Extra time was spent trying to understand what he was saying. Patient was encouraged to call if he had any questions or concerns regarding instructions.

## 2019-01-15 ENCOUNTER — Encounter: Payer: Self-pay | Admitting: Internal Medicine

## 2019-01-16 ENCOUNTER — Telehealth: Payer: Self-pay | Admitting: Internal Medicine
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
## 2019-01-21 ENCOUNTER — Encounter: Payer: Medicare HMO | Admitting: Internal Medicine

## 2019-01-26 ENCOUNTER — Telehealth: Payer: Self-pay | Admitting: *Deleted

## 2019-01-26 NOTE — Telephone Encounter (Signed)
Message left

## 2019-01-26 NOTE — Telephone Encounter (Signed)

## 2019-01-28 ENCOUNTER — Encounter: Payer: Self-pay | Admitting: Internal Medicine

## 2019-01-28 ENCOUNTER — Ambulatory Visit (AMBULATORY_SURGERY_CENTER): Payer: Medicare HMO | Admitting: Internal Medicine

## 2019-01-28 ENCOUNTER — Other Ambulatory Visit: Payer: Self-pay

## 2019-01-28 VITALS — BP 144/78 | HR 69 | Temp 98.7°F | Resp 21 | Ht 68.0 in | Wt 240.0 lb

## 2019-01-28 DIAGNOSIS — D123 Benign neoplasm of transverse colon: Secondary | ICD-10-CM

## 2019-01-28 DIAGNOSIS — Z86010 Personal history of colonic polyps: Secondary | ICD-10-CM | POA: Diagnosis not present

## 2019-01-28 MED ORDER — SODIUM CHLORIDE 0.9 % IV SOLN: 500.0000 mL | Freq: Once | INTRAVENOUS | Status: DC

## 2019-01-28 NOTE — Progress Notes (Signed)
Courtney Washington , CMA- Temp Judy Branson, CMA- Vitals  

## 2019-01-28 NOTE — Patient Instructions (Signed)
That you for allowing [place] to care for you today!Await pathology result of polyp that was removed.   Repeat colonoscopy in 7 years.  Resume your previous diet and medications today.  Return to your normal activities tomorrow.  Handout given for diverticulosis and polyps.     YOU HAD AN ENDOSCOPIC PROCEDURE TODAY AT [facility]:   Refer to the procedure report that was given to you for any specific questions about what was found during the examination.  If the procedure report does not answer your questions, please call your gastroenterologist to clarify.  If you requested that your care partner not be given the details of your procedure findings, then the procedure report has been included in a sealed envelope for you to review at your convenience later.  YOU SHOULD EXPECT: Some feelings of bloating in the abdomen. Passage of more gas than usual.  Walking can help get rid of the air that was put into your GI tract during the procedure and reduce the bloating. If you had a lower endoscopy (such as a colonoscopy or flexible sigmoidoscopy) you may notice spotting of blood in your stool or on the toilet paper. If you underwent a bowel prep for your procedure, you may not have a normal bowel movement for a few days.  Please Note:  You might notice some irritation and congestion in your nose or some drainage.  This is from the oxygen used during your procedure.  There is no need for concern and it should clear up in a day or so.  SYMPTOMS TO REPORT IMMEDIATELY:   Following lower endoscopy (colonoscopy or flexible sigmoidoscopy):  Excessive amounts of blood in the stool  Significant tenderness or worsening of abdominal pains  Swelling of the abdomen that is new, acute  Fever of 100F or higher   For urgent or emergent issues, a gastroenterologist can be reached at any hour by calling [phone].   DIET:  We do recommend a small meal at first, but then you may proceed to your  regular diet.  Drink plenty of fluids but you should avoid alcoholic beverages for 24 hours.  ACTIVITY:  You should plan to take it easy for the rest of today and you should NOT DRIVE or use heavy machinery until tomorrow (because of the sedation medicines used during the test).    FOLLOW UP: Our staff will call the number listed on your records 48-72 hours following your procedure to check on you and address any questions or concerns that you may have regarding the information given to you following your procedure. If we do not reach you, we will leave a message.  We will attempt to reach you two times.  During this call, we will ask if you have developed any symptoms of COVID 19. If you develop any symptoms (ie: fever, flu-like symptoms, shortness of breath, cough etc.) before then, please call [phone].  If you test positive for Covid 19 in the 2 weeks post procedure, please call and report this information to [place].    If any biopsies were taken you will be contacted by phone or by letter within the next 1-3 weeks.  Please call us at [phone] if you have not heard about the biopsies in 3 weeks.    SIGNATURES/CONFIDENTIALITY: You and/or your care partner have signed paperwork which will be entered into your electronic medical record.  These signatures attest to the fact that that the information above on your After Visit  Summary has been reviewed and is understood.  Full responsibility of the confidentiality of this discharge information lies with you and/or your care-partner. 

## 2019-01-28 NOTE — Progress Notes (Signed)
Report to PACU, RN, vss, BBS= Clear.  

## 2019-01-28 NOTE — Progress Notes (Signed)
Called to room to assist during endoscopic procedure.  Patient ID and intended procedure confirmed with present staff. Received instructions for my participation in the procedure from the performing physician.  

## 2019-01-28 NOTE — Progress Notes (Signed)
Pt's states no medical or surgical changes since previsit or office visit. 

## 2019-01-28 NOTE — Op Note (Signed)
[facility] Patient Name: Tome Wilson Procedure Date: 01/28/2019 8:29 AM MRN: [id number] Endoscopist: Docia Chuck. Henrene Pastor , MD Age: 68 Referring MD:  Date of Birth: 1950-12-07 Gender: Male Account #: [id number] Procedure:                Colonoscopy with cold snare polypectomy x 1 Indications:              High risk colon cancer surveillance: Personal                            history of non-advanced adenomas. Previous                            examinations 2009 and 2015 Medicines:                Monitored Anesthesia Care Procedure:                Pre-Anesthesia Assessment:                           - Prior to the procedure, a History and Physical                            was performed, and patient medications and                            allergies were reviewed. The patient's tolerance of                            previous anesthesia was also reviewed. The risks                            and benefits of the procedure and the sedation                            options and risks were discussed with the patient.                            All questions were answered, and informed consent                            was obtained. Prior Anticoagulants: The patient has                            taken no previous anticoagulant or antiplatelet                            agents. ASA Grade Assessment: II - A patient with                            mild systemic disease. After reviewing the risks                            and benefits, the patient was deemed in  satisfactory condition to undergo the procedure.                           After obtaining informed consent, the colonoscope                            was passed under direct vision. Throughout the                            procedure, the patient's blood pressure, pulse, and                            oxygen saturations were monitored continuously. The                            Model  CF-[id number]) scope was introduced                            through the anus and advanced to the the cecum,                            identified by appendiceal orifice and ileocecal                            valve. The ileocecal valve, appendiceal orifice,                            and rectum were photographed. The quality of the                            bowel preparation was good. The colonoscopy was                            performed without difficulty. The patient tolerated                            the procedure well. The bowel preparation used was                            SUPREP via split dose instruction. Scope In: 8:38:24 AM Scope Out: 8:59:06 AM Scope Withdrawal Time: 0 hours 10 minutes 56 seconds  Total Procedure Duration: 0 hours 20 minutes 42 seconds  Findings:                 A 4 mm polyp was found in the distal transverse                            colon. The polyp was sessile. The polyp was removed                            with a cold snare. Resection and retrieval were                            complete.  Multiple diverticula were found in the entire colon                            with marked STENOSIS in the mid sigmoid colon.                           The exam was otherwise without abnormality on                            direct and retroflexion views, though the colon was                            REDUNDANT. Complications:            No immediate complications. Estimated blood loss:                            None. Estimated Blood Loss:     Estimated blood loss: none. Impression:               - One 4 mm polyp in the distal transverse colon,                            removed with a cold snare. Resected and retrieved.                           - Diverticulosis in the entire examined colon with                            mid sigmoid STENOSIS.                           - The examination was otherwise normal on direct                             and retroflexion views, though REDUNDANT. Recommendation:           - Repeat colonoscopy in 7 years for surveillance.                           - Patient has a contact number available for                            emergencies. The signs and symptoms of potential                            delayed complications were discussed with the                            patient. Return to normal activities tomorrow.                            Written discharge instructions were provided to the  patient.                           - Resume previous diet.                           - Continue present medications.                           - Await pathology results. Docia Chuck. Henrene Pastor, MD 01/28/2019 9:05:20 AM This report has been signed electronically.

## 2019-01-30 ENCOUNTER — Telehealth: Payer: Self-pay | Admitting: *Deleted

## 2019-01-30 ENCOUNTER — Encounter: Payer: Self-pay | Admitting: Internal Medicine

## 2019-01-30 NOTE — Telephone Encounter (Signed)
  Follow up Call-  Call back number 01/28/2019  Post procedure Call Back phone  # [phone]  Permission to leave phone message Yes  Some recent data might be hidden     Patient questions:  Do you have a fever, pain , or abdominal swelling? No. Pain Score  0 *  Have you tolerated food without any problems? Yes.    Have you been able to return to your normal activities? Yes.    Do you have any questions about your discharge instructions: Diet   No. Medications  No. Follow up visit  No.  Do you have questions or concerns about your Care? No.  Actions: * If pain score is 4 or above: No action needed, pain <4.  1. Have you developed a fever since your procedure? NO  2.   Have you had an respiratory symptoms (SOB or cough) since your procedure? NO  3.   Have you tested positive for COVID 19 since your procedure NO  4.   Have you had any family members/close contacts diagnosed with the COVID 19 since your procedure?  NO   If yes to any of these questions please route to Joylene John, RN and Alphonsa Gin, RN.

## 2019-05-03 ENCOUNTER — Emergency Department (HOSPITAL_COMMUNITY)
Admission: EM | Admit: 2019-05-03 | Discharge: 2019-05-03 | Disposition: A | Discharge status: Home / Self Care | Payer: Medicare HMO | Attending: Emergency Medicine | Admitting: Emergency Medicine

## 2019-05-03 ENCOUNTER — Other Ambulatory Visit: Payer: Self-pay

## 2019-05-03 ENCOUNTER — Encounter (HOSPITAL_COMMUNITY): Payer: Self-pay | Admitting: Emergency Medicine

## 2019-05-03 DIAGNOSIS — Z79899 Other long term (current) drug therapy: Secondary | ICD-10-CM | POA: Insufficient documentation

## 2019-05-03 DIAGNOSIS — I10 Essential (primary) hypertension: Secondary | ICD-10-CM | POA: Insufficient documentation

## 2019-05-03 DIAGNOSIS — E119 Type 2 diabetes mellitus without complications: Secondary | ICD-10-CM | POA: Diagnosis not present

## 2019-05-03 DIAGNOSIS — K0889 Other specified disorders of teeth and supporting structures: Secondary | ICD-10-CM | POA: Diagnosis not present

## 2019-05-03 DIAGNOSIS — F17210 Nicotine dependence, cigarettes, uncomplicated: Secondary | ICD-10-CM | POA: Diagnosis not present

## 2019-05-03 MED ORDER — PENICILLIN V POTASSIUM 500 MG PO TABS: 500.0000 mg | ORAL_TABLET | Freq: Four times a day (QID) | ORAL | 0 refills | Status: DC

## 2019-05-03 NOTE — ED Triage Notes (Signed)
C/o R upper dental pain x 4 days.

## 2019-05-03 NOTE — Discharge Instructions (Signed)
Take the prescribed medication as directed-- has been sent to your pharmacy already. Follow-up with dentist, Dr. Radford Pax is on call--- call their office in the morning to make appt. Return to the ED for new or worsening symptoms.

## 2019-05-03 NOTE — ED Provider Notes (Signed)
[facility] EMERGENCY DEPARTMENT Provider Note   CSN: [id number] Arrival date & time: 05/03/19  2048     History   Chief Complaint Chief Complaint  Patient presents with  . Dental Pain    HPI Bradley [name] is a 68 y.o. male.     The history is provided by the patient and medical records.     68 year old male with history of arthritis, bipolar disorder, diabetes, hyperlipidemia, hypertension, presenting to the ED with right upper dental pain for the past 4 days.  States he is not quite sure why his tooth started hurting, now he is having trouble chewing on that side.  States he feels like there might be some swelling.  He has not had any difficulty swallowing.  No fever or chills.  He is not currently established with a dentist.  He tried taking Tylenol at home without relief.  Past Medical History:  Diagnosis Date  . Arthritis   . Bipolar 2 disorder ([place]) 2005   Also carries diagnosis of schizophrenia.  . Colon adenomas 2009, 2015  . Diabetes mellitus without complication ([place])   . Hyperlipemia   . Hypertension   . Hypogonadism male    Erectile dysfunction  . Lower GI bleed 07/2015  . 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      Patient Active Problem List   Diagnosis Date Noted  . Lower GI bleed 07/26/2015  . Acute lower GI bleeding 07/26/2015  . Hematochezia 07/26/2015  . Bipolar I disorder ([place])   . HTN (hypertension), benign   . Diverticulitis of large intestine with abscess without bleeding   . Essential hypertension   . Colonic diverticular abscess   . Diverticulitis 10/13/2014  . Abscess 10/13/2014  . Erectile dysfunction 01/22/2014  . Tobacco abuse 01/22/2014  . Thrombocytopenia, unspecified ([place]) 06/17/2013  . Hyperlipemia   . Bipolar 2 disorder [facility])     Past Surgical History:  Procedure Laterality Date  . COLONOSCOPY  2009, 2015  . ENTEROSCOPY N/A 07/28/2015   Procedure:  ENTEROSCOPY;  Surgeon: Ladene Artist, MD;  Location: [facility] ENDOSCOPY;  Service: Endoscopy;  Laterality: N/A;        Home Medications    Prior to Admission medications   Medication Sig Start Date End Date Taking? Authorizing Provider  acetaminophen (TYLENOL) 500 MG tablet Take 1 tablet (500 mg total) by mouth every 6 (six) hours as needed for mild pain or moderate pain. 03/28/17   Fawze, Mina A, PA-C  albuterol (PROVENTIL HFA;VENTOLIN HFA) 108 (90 Base) MCG/ACT inhaler Inhale 1-2 puffs into the lungs every 6 (six) hours as needed for wheezing or shortness of breath. 03/07/17   Rancour, Annie Main, MD  amLODipine (NORVASC) 10 MG tablet Take 10 mg by mouth daily.    [provider]  divalproex (DEPAKOTE ER) 500 MG 24 hr tablet Take 1,000 mg by mouth at bedtime.    [provider]  furosemide (LASIX) 40 MG tablet  04/20/17   [provider]  lisinopril (PRINIVIL,ZESTRIL) 40 MG tablet Take 40 mg by mouth daily.    [provider]  pravastatin (PRAVACHOL) 80 MG tablet Take 80 mg by mouth every evening.     [provider]  risperiDONE (RISPERDAL) 2 MG tablet  05/01/17   [provider]    Family History Family History  Problem Relation Age of Onset  . Cancer Brother        abdominal CA  . Colon  cancer Neg Hx   . Esophageal cancer Neg Hx   . Rectal cancer Neg Hx   . Stomach cancer Neg Hx     Social History Social History   Tobacco Use  . Smoking status: Current Every Day Smoker    Packs/day: 1.00    Years: 0.00    Pack years: 0.00    Types: Cigarettes  . Smokeless tobacco: Never Used  Substance Use Topics  . Alcohol use: No  . Drug use: No     Allergies   Patient has no known allergies.   Review of Systems Review of Systems  HENT: Positive for dental problem.   All other systems reviewed and are negative.    Physical Exam Updated Vital Signs BP (!) 142/84 (BP Location: Left Arm)   Pulse 72   Temp 98.4 F (36.9 C)  (Oral)   Resp 20   SpO2 95%   Physical Exam Vitals signs and nursing note reviewed.  Constitutional:      Appearance: He is well-developed.  HENT:     Head: Normocephalic and atraumatic.     Comments: Teeth largely in fair dentition,  Right upper molar with signs of decay and focal cavity centrally, surrounding gingiva mildly swollen without discrete abscess, handling secretions appropriately, no trismus, no facial or neck swelling, normal phonation without stridor Eyes:     Conjunctiva/sclera: Conjunctivae normal.     Pupils: Pupils are equal, round, and reactive to light.  Neck:     Musculoskeletal: Normal range of motion.  Cardiovascular:     Rate and Rhythm: Normal rate and regular rhythm.     Heart sounds: Normal heart sounds.  Pulmonary:     Effort: Pulmonary effort is normal.     Breath sounds: Normal breath sounds.  Abdominal:     General: Bowel sounds are normal.     Palpations: Abdomen is soft.  Musculoskeletal: Normal range of motion.  Skin:    General: Skin is warm and dry.  Neurological:     Mental Status: He is alert and oriented to person, place, and time.      ED Treatments / Results  Labs (all labs ordered are listed, but only abnormal results are displayed) Labs Reviewed - No data to display  EKG None  Radiology No results found.  Procedures Procedures (including critical care time)  Medications Ordered in ED Medications - No data to display   Initial Impression / Assessment and Plan / ED Course  I have reviewed the triage vital signs and the nursing notes.  Pertinent labs & imaging results that were available during my care of the patient were reviewed by me and considered in my medical decision making (see chart for details).  68 year old male here with right upper dental pain for the past 4 days.  He is afebrile and nontoxic.  Right upper molar is decayed with central cavity.  His surrounding gums are mildly swollen but no discrete  abscess.  Does not have any facial or neck swelling.  He is handling his secretions well, normal phonation without stridor.  No signs or symptoms concerning for Ludwig's angina.  He will be started on antibiotics and referred to dentist for follow-up.  He may return here for any new or acute changes.  Final Clinical Impressions(s) / ED Diagnoses   Final diagnoses:  Pain, dental    ED Discharge Orders         Ordered    penicillin v potassium (VEETID) 500 MG tablet  4 times daily     05/03/19 2330           Larene Pickett, PA-C 05/03/19 2333    Mesner, Corene Cornea, MD 05/04/19 [id number]

## 2020-03-01 ENCOUNTER — Telehealth: Payer: Self-pay | Admitting: Radiation Oncology

## 2020-03-01 ENCOUNTER — Ambulatory Visit: Payer: Medicare HMO

## 2020-03-01 ENCOUNTER — Ambulatory Visit
Admission: RE | Admit: 2020-03-01 | Discharge: 2020-03-01 | Disposition: A | Discharge status: Home / Self Care | Payer: Medicare HMO | Source: Ambulatory Visit | Attending: Radiation Oncology | Admitting: Radiation Oncology

## 2020-03-01 NOTE — Telephone Encounter (Signed)
Phoned patient multiple times to begin consultation. No answer. Left voicemail message requesting return call to reschedule consult appointment.

## 2020-03-01 NOTE — Telephone Encounter (Signed)
Per Sam, In basket msg, to reschedule patient. He is now rescheduled to come in on 8/13 @ 10:30a. Pt confrmd.

## 2020-03-26 ENCOUNTER — Emergency Department (HOSPITAL_COMMUNITY)
Admission: EM | Admit: 2020-03-26 | Discharge: 2020-03-27 | Discharge status: Against Medical Advice | Payer: Medicare HMO

## 2020-03-28 ENCOUNTER — Emergency Department (HOSPITAL_COMMUNITY)
Admission: EM | Admit: 2020-03-28 | Discharge: 2020-03-29 | Disposition: A | Discharge status: Home / Self Care | Payer: Medicare HMO | Attending: Emergency Medicine | Admitting: Emergency Medicine

## 2020-03-28 ENCOUNTER — Other Ambulatory Visit: Payer: Self-pay

## 2020-03-28 ENCOUNTER — Encounter (HOSPITAL_COMMUNITY): Payer: Self-pay | Admitting: Emergency Medicine

## 2020-03-28 DIAGNOSIS — E119 Type 2 diabetes mellitus without complications: Secondary | ICD-10-CM | POA: Diagnosis not present

## 2020-03-28 DIAGNOSIS — Z046 Encounter for general psychiatric examination, requested by authority: Secondary | ICD-10-CM | POA: Diagnosis not present

## 2020-03-28 DIAGNOSIS — R258 Other abnormal involuntary movements: Secondary | ICD-10-CM | POA: Insufficient documentation

## 2020-03-28 DIAGNOSIS — F17210 Nicotine dependence, cigarettes, uncomplicated: Secondary | ICD-10-CM | POA: Insufficient documentation

## 2020-03-28 DIAGNOSIS — F200 Paranoid schizophrenia: Secondary | ICD-10-CM | POA: Insufficient documentation

## 2020-03-28 DIAGNOSIS — R4189 Other symptoms and signs involving cognitive functions and awareness: Secondary | ICD-10-CM | POA: Insufficient documentation

## 2020-03-28 DIAGNOSIS — Z79899 Other long term (current) drug therapy: Secondary | ICD-10-CM | POA: Insufficient documentation

## 2020-03-28 DIAGNOSIS — F23 Brief psychotic disorder: Secondary | ICD-10-CM

## 2020-03-28 DIAGNOSIS — Z7984 Long term (current) use of oral hypoglycemic drugs: Secondary | ICD-10-CM | POA: Diagnosis not present

## 2020-03-28 DIAGNOSIS — I10 Essential (primary) hypertension: Secondary | ICD-10-CM | POA: Insufficient documentation

## 2020-03-28 LAB — COMPREHENSIVE METABOLIC PANEL
ALT: 19 U/L (ref 0–44)
AST: 11 U/L — ABNORMAL LOW (ref 15–41)
Albumin: 3.9 g/dL (ref 3.5–5.0)
Alkaline Phosphatase: 41 U/L (ref 38–126)
Anion gap: 6 (ref 5–15)
BUN: 12 mg/dL (ref 8–23)
CO2: 27 mmol/L (ref 22–32)
Calcium: 9.1 mg/dL (ref 8.9–10.3)
Chloride: 108 mmol/L (ref 98–111)
Creatinine, Ser: 1 mg/dL (ref 0.61–1.24)
GFR calc Af Amer: >60 mL/min (ref >60)
GFR calc non Af Amer: >60 mL/min (ref >60)
Glucose, Bld: 101 mg/dL — ABNORMAL HIGH (ref 70–99)
Potassium: 4.1 mmol/L (ref 3.5–5.1)
Sodium: 141 mmol/L (ref 135–145)
Total Bilirubin: 0.7 mg/dL (ref 0.3–1.2)
Total Protein: 7.8 g/dL (ref 6.5–8.1)

## 2020-03-28 LAB — CBC
HCT: 51.5 % (ref 39.0–52.0)
Hemoglobin: 16.4 g/dL (ref 13.0–17.0)
MCH: 28.2 pg (ref 26.0–34.0)
MCHC: 31.8 g/dL (ref 30.0–36.0)
MCV: 88.6 fL (ref 80.0–100.0)
Platelets: 121 10*3/uL — ABNORMAL LOW (ref 150–400)
RBC: 5.81 MIL/uL (ref 4.22–5.81)
RDW: 15.5 % (ref 11.5–15.5)
WBC: 5.7 10*3/uL (ref 4.0–10.5)
nRBC: 0 % (ref 0.0–0.2)

## 2020-03-28 LAB — ACETAMINOPHEN LEVEL: Acetaminophen (Tylenol), Serum: <10 ug/mL — ABNORMAL LOW (ref 10–30)

## 2020-03-28 LAB — RAPID URINE DRUG SCREEN, HOSP PERFORMED
Amphetamines: NOT DETECTED
Barbiturates: NOT DETECTED
Benzodiazepines: NOT DETECTED
Cocaine: NOT DETECTED
Opiates: NOT DETECTED
Tetrahydrocannabinol: NOT DETECTED

## 2020-03-28 LAB — SALICYLATE LEVEL: Salicylate Lvl: <7 mg/dL — ABNORMAL LOW (ref 7.0–30.0)

## 2020-03-28 LAB — ETHANOL: Alcohol, Ethyl (B): <10 mg/dL (ref <10)

## 2020-03-28 MED ORDER — AMLODIPINE BESYLATE 5 MG PO TABS
10.0000 mg | ORAL_TABLET | Freq: Every day | ORAL | Status: DC
  Administered 2020-03-29: 10 mg via ORAL
  Filled 2020-03-28 (×2): qty 2

## 2020-03-28 MED ORDER — METFORMIN HCL 500 MG PO TABS
1000.0000 mg | ORAL_TABLET | Freq: Every day | ORAL | Status: DC
  Administered 2020-03-29: 1000 mg via ORAL
  Filled 2020-03-28: qty 2

## 2020-03-28 MED ORDER — HYDROCHLOROTHIAZIDE 25 MG PO TABS
25.0000 mg | ORAL_TABLET | Freq: Every day | ORAL | Status: DC
  Administered 2020-03-29 (×2): 25 mg via ORAL
  Filled 2020-03-28 (×2): qty 1

## 2020-03-28 MED ORDER — LISINOPRIL 20 MG PO TABS
40.0000 mg | ORAL_TABLET | Freq: Every day | ORAL | Status: DC
  Administered 2020-03-29 (×2): 40 mg via ORAL
  Filled 2020-03-28 (×2): qty 2

## 2020-03-28 MED ORDER — PRAVASTATIN SODIUM 20 MG PO TABS
20.0000 mg | ORAL_TABLET | Freq: Every day | ORAL | Status: DC
  Administered 2020-03-29: 20 mg via ORAL
  Filled 2020-03-28: qty 1

## 2020-03-28 NOTE — ED Notes (Signed)
Pt has a cough and reports a sore throat. He is difficult to understand r/t foreign accent.

## 2020-03-28 NOTE — ED Triage Notes (Signed)
Brought in by [employer] under IVC taken out by his daughter. She states that he is a danger to himself and others. He believes that he is Jesus and hs "The Power" and that petitioner is the [employer] the daughter frequently to call her the Devil and to threaten her.   [employer] state that he has been calm and cooperative with them.  On admission to the TCU is continues to be calm and cooperative.

## 2020-03-28 NOTE — ED Notes (Signed)
Pt in room talking constantly to himself. Sometimes he comes out of the room to complain about something.

## 2020-03-28 NOTE — ED Provider Notes (Signed)
[name] DEPT Provider Note   CSN: [id number] Arrival date & time: 03/28/20  1419     History Chief Complaint  Patient presents with  . Medical Clearance  . Schizophrenia    Bradley [name] is a 69 y.o. male.  69 y.o male with a PMH of DM, HTN, Bipolar presents to the ED under IVC by daughter, patient was stating "I am Jesus I have the power ", patient's daughter according to patient is "she is stated ".  Patient became aggressive towards daughter and was placed under IVC.  Patient is overall cooperative with [employer].  Does have a prior history of mental health.  No SI, HI, visual or auditory hallucinations.  The history is provided by the patient and medical records.       Past Medical History:  Diagnosis Date  . Arthritis   . Bipolar 2 disorder ([place]) 2005   Also carries diagnosis of schizophrenia.  . Colon adenomas 2009, 2015  . Diabetes mellitus without complication ([place])   . Hyperlipemia   . Hypertension   . Hypogonadism male    Erectile dysfunction  . Lower GI bleed 07/2015  . 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      Patient Active Problem List   Diagnosis Date Noted  . Lower GI bleed 07/26/2015  . Acute lower GI bleeding 07/26/2015  . Hematochezia 07/26/2015  . Bipolar I disorder ([place])   . HTN (hypertension), benign   . Diverticulitis of large intestine with abscess without bleeding   . Essential hypertension   . Colonic diverticular abscess   . Diverticulitis 10/13/2014  . Abscess 10/13/2014  . Erectile dysfunction 01/22/2014  . Tobacco abuse 01/22/2014  . Thrombocytopenia, unspecified ([place]) 06/17/2013  . Hyperlipemia   . Bipolar 2 disorder [facility])     Past Surgical History:  Procedure Laterality Date  . COLONOSCOPY  2009, 2015  . ENTEROSCOPY N/A 07/28/2015   Procedure: ENTEROSCOPY;  Surgeon: Ladene Artist, MD;  Location: [facility] ENDOSCOPY;  Service: Endoscopy;  Laterality: N/A;         Family History  Problem Relation Age of Onset  . Cancer Brother        abdominal CA  . Colon cancer Neg Hx   . Esophageal cancer Neg Hx   . Rectal cancer Neg Hx   . Stomach cancer Neg Hx     Social History   Tobacco Use  . Smoking status: Current Every Day Smoker    Packs/day: 1.00    Years: 0.00    Pack years: 0.00    Types: Cigarettes  . Smokeless tobacco: Never Used  Vaping Use  . Vaping Use: Never used  Substance Use Topics  . Alcohol use: No  . Drug use: No    Home Medications Prior to Admission medications   Medication Sig Start Date End Date Taking? Authorizing Provider  albuterol (PROVENTIL HFA;VENTOLIN HFA) 108 (90 Base) MCG/ACT inhaler Inhale 1-2 puffs into the lungs every 6 (six) hours as needed for wheezing or shortness of breath. 03/07/17  Yes Rancour, Annie Main, MD  amLODipine (NORVASC) 10 MG tablet Take 10 mg by mouth daily.   Yes [provider]  dorzolamide-timolol (COSOPT) 22.3-6.8 MG/ML ophthalmic solution Place 1 drop into both eyes daily.  11/24/19  Yes [provider]  hydrochlorothiazide (HYDRODIURIL) 25 MG tablet Take 25 mg by mouth daily. 01/10/20  Yes [provider]  lisinopril (PRINIVIL,ZESTRIL) 40 MG tablet Take 40 mg by  mouth daily.   Yes [provider]  metFORMIN (GLUCOPHAGE) 1000 MG tablet Take 1,000 mg by mouth daily. 01/10/20  Yes [provider]  pravastatin (PRAVACHOL) 20 MG tablet Take 20 mg by mouth at bedtime. 02/02/20  Yes [provider]  acetaminophen (TYLENOL) 500 MG tablet Take 1 tablet (500 mg total) by mouth every 6 (six) hours as needed for mild pain or moderate pain. Patient not taking: Reported on 03/28/2020 03/28/17   Rodell Perna A, PA-C  penicillin v potassium (VEETID) 500 MG tablet Take 1 tablet (500 mg total) by mouth 4 (four) times daily. Patient not taking: Reported on 03/28/2020 05/03/19   Larene Pickett, PA-C    Allergies    Patient has no known allergies.  Review  of Systems   Review of Systems  Constitutional: Negative for chills and fever.  Respiratory: Negative for shortness of breath.   Cardiovascular: Negative for chest pain.  Gastrointestinal: Negative for abdominal pain, nausea and vomiting.  Genitourinary: Negative for flank pain.  Neurological: Negative for light-headedness and headaches.  Psychiatric/Behavioral: Negative for confusion, dysphoric mood and hallucinations. The patient is not nervous/anxious.   All other systems reviewed and are negative.   Physical Exam Updated Vital Signs BP (!) 183/84 (BP Location: Right Arm)   Pulse 60   Temp 98.5 F (36.9 C) (Oral)   Resp 20   SpO2 93%   Physical Exam Vitals and nursing note reviewed.  Constitutional:      Appearance: He is well-developed.  HENT:     Head: Normocephalic and atraumatic.  Eyes:     General: No scleral icterus.    Pupils: Pupils are equal, round, and reactive to light.  Cardiovascular:     Heart sounds: Normal heart sounds.  Pulmonary:     Effort: Pulmonary effort is normal.     Breath sounds: Normal breath sounds. No wheezing.  Chest:     Chest wall: No tenderness.  Abdominal:     General: Bowel sounds are normal. There is no distension.     Palpations: Abdomen is soft.     Tenderness: There is no abdominal tenderness.  Musculoskeletal:        General: No tenderness or deformity.     Cervical back: Normal range of motion.  Skin:    General: Skin is warm and dry.  Neurological:     Mental Status: He is alert and oriented to person, place, and time.  Psychiatric:        Attention and Perception: Attention normal.        Mood and Affect: Affect normal.        Speech: Speech is tangential.        Behavior: Behavior is cooperative.     ED Results / Procedures / Treatments   Labs (all labs ordered are listed, but only abnormal results are displayed) Labs Reviewed  COMPREHENSIVE METABOLIC PANEL - Abnormal; Notable for the following components:       Result Value   Glucose, Bld 101 (*)    AST 11 (*)    All other components within normal limits  SALICYLATE LEVEL - Abnormal; Notable for the following components:   Salicylate Lvl <0.9 (*)    All other components within normal limits  ACETAMINOPHEN LEVEL - Abnormal; Notable for the following components:   Acetaminophen (Tylenol), Serum <10 (*)    All other components within normal limits  CBC - Abnormal; Notable for the following components:   Platelets 121 (*)  All other components within normal limits  ETHANOL  RAPID URINE DRUG SCREEN, HOSP PERFORMED    EKG None  Radiology No results found.  Procedures Procedures (including critical care time)  Medications Ordered in ED Medications - No data to display  ED Course  I have reviewed the triage vital signs and the nursing notes.  Pertinent labs & imaging results that were available during my care of the patient were reviewed by me and considered in my medical decision making (see chart for details).    MDM Rules/Calculators/A&P   Patient here under involuntary commitment placed by daughter after stating "I am Jesus, daughter is Naval architect".  He does have a prior history of mental health disease, does report compliance with his medication.  He is cooperative under the custody of [employer].  Vitals are within normal limits.  During evaluation patient is cooperative, speaking in full sentences, denies any SI, HI but does have some hallucinations while we are speaking.  Definition of his labs revealed a CBC that is unremarkable.  CMP without any electrolyte derangement, LFTs are within normal limits.  Kidney functions preserved.  Ethanol, salicylate, acetaminophen level within normal limits.  UDS without any elicited substance.  Patient is currently medically cleared to obtain psychiatric evaluation.  8:16 PM Patient is recommended for overnight observation under psychiatric care per Laurell Roof counselor.    Portions of this note  were generated with [employer]. Dictation errors may occur despite best attempts at proofreading.  Final Clinical Impression(s) / ED Diagnoses Final diagnoses:  Involuntary commitment    Rx / DC Orders ED Discharge Orders    None       Janeece Fitting, PA-C 03/28/20 2017    Pattricia Boss, MD 03/29/20 1431

## 2020-03-28 NOTE — ED Notes (Signed)
Attempted to draw blood x1 RAC w/o success. Called phlebotomy to draw the blood.

## 2020-03-28 NOTE — ED Notes (Signed)
Patient sitting in room constantly talking to self, experienced an outburst and came out the room cussing at security guard, asking "why am I here", verbally redirected by this nurse

## 2020-03-28 NOTE — ED Notes (Signed)
Pt presented to TCU with 1 pt belonging bag located in locker 28

## 2020-03-28 NOTE — ED Notes (Signed)
Pt got angry because he thought that he was locked in.

## 2020-03-28 NOTE — BH Assessment (Signed)
Comprehensive Clinical Assessment (CCA) Note  03/28/2020 Bradley [name] [id number]  Patient is a 69 year old male who presented to [facility] under IVC by daughter, due to recent onset of psychosis that has reportedly worsened over the past 2-3 days.  She reports the following: "He is a danger to himself and others. He believes that he is Jesus and hs "The Power" and that petitioner is the [employer] the daughter frequently to call her the Devil and to threaten her."  Upon assessment, patient is pleasant and cooperative referring to [facility] as "Ma'am" in response to each question.  He isn't aware of concerns his daughter has and he denies signs or symptoms of psychosis.  He denies SI, HI and AVH.  Some responses were difficult to decipher at times, given patient's strong [place] accent.  He is clear that he has no thoughts to harm himself or others.    Patient gave consent for LPC to speak to his daughter, Bradley [name]([id number]).  He states, "You are welcome to call her.  I love my daughter."  Patient's daughter states, "I've told the staff and it's in the paperwork."  Patient's mother offered limited additional collateral, outside of stating that her father has been leaving bizarre and threatening messages on her voicemail.  She informed patient that she intended to call the police due to his recent behaviors.  Patient's daughter is unaware of patient's medications or whether he is attending appointments with [facility].  She states he "gets like this" when he has episodes and she is hoping he will get treatment before he declines further.  She is hoping he will be referred to ACTT services for assistance with taking medications as prescribed.    Per Earleen Newport, NP overnight observation is recommended for safety, stabilization and evaluation by psychiatry in the morning.   Visit Diagnosis:   Schizophrenia, Paranoid Type   CCA Screening, Triage and Referral (STR)  Patient Reported Information How did you  hear about [place]? Legal System  Referral name: No data recorded Referral phone number: No data recorded  Whom do you see for routine medical problems? Other (Comment) (Pt doesn't recall name of provider)  Practice/Facility Name: No data recorded Practice/Facility Phone Number: No data recorded Name of Contact: No data recorded Contact Number: No data recorded Contact Fax Number: No data recorded Prescriber Name: No data recorded Prescriber Address (if known): No data recorded  What Is the Reason for Your Visit/Call Today? Patient is under IVC initiated by daughter due to concerns he is experiencing worsening psychosis.  Patient is not aware of her concerns or why he was brought to the ED.  How Long Has This Been Causing You Problems? <Week  What Do You Feel Would Help You the Most Today? Medication   Have You Recently Been in Any Inpatient Treatment (Hospital/Detox/Crisis Center/28-Day Program)? No  Name/Location of Program/Hospital:No data recorded How Long Were You There? No data recorded When Were You Discharged? No data recorded  Have You Ever Received Services From [facility] Before? No  Who Do You See at [facility]? No data recorded  Have You Recently Had Any Thoughts About Hurting Yourself? No  Are You Planning to Commit Suicide/Harm Yourself At This time? No   Have you Recently Had Thoughts About [place]? No  Explanation: No data recorded  Have You Used Any Alcohol or Drugs in the Past 24 Hours? No  How Long Ago Did You Use Drugs or Alcohol? No data recorded What Did You  Use and How Much? No data recorded  Do You Currently Have a Therapist/Psychiatrist? Yes  Name of Therapist/Psychiatrist: [facility]   Have You Been Recently Discharged From Any Office Practice or Programs? No  Explanation of Discharge From Practice/Program: No data recorded    CCA Screening Triage Referral Assessment Type of Contact:  Tele-Assessment  Is this Initial or Reassessment? Initial Assessment  Date Telepsych consult ordered in [facility]:  03/28/20  Time Telepsych consult ordered in [facility]:  1619   Patient Reported Information Reviewed? Yes  Patient Left Without Being Seen? No data recorded Reason for Not Completing Assessment: No data recorded  Collateral Involvement: Patient's daugher provided limited collateral.   Does Patient Have a Court Appointed Legal Guardian? No data recorded Name and Contact of Legal Guardian: No data recorded If Minor and Not Living with Parent(s), Who has Custody? No data recorded Is CPS involved or ever been involved? Never  Is APS involved or ever been involved? Never   Patient Determined To Be At Risk for Harm To Self or Others Based on Review of Patient Reported Information or Presenting Complaint? Yes, for Harm to Others  Method: No Plan  Availability of Means: No access or NA  Intent: Vague intent or NA  Notification Required: No need or identified person  Additional Information for Danger to Others Potential: Active psychosis (Per daughter's report - not currently appearing psychotic)  Additional Comments for Danger to Others Potential: per daughter pt has been leaving harrassing messages and banging on her door, scaring her and her children.  Are There Guns or Other Weapons in [place]? No  Types of Guns/Weapons: No data recorded Are These Weapons Safely Secured?                            No data recorded Who Could Verify You Are Able To Have These Secured: No data recorded Do You Have any Outstanding Charges, Pending Court Dates, Parole/Probation? No  Contacted To Inform of Risk of Harm To Self or Others: Family/Significant Other:   Location of Assessment: [facility]   Does Patient Present under Involuntary Commitment? Yes  IVC Papers Initial File Date: 03/28/20   [place] of Residence: [place]   Patient Currently Receiving the Following Services:  Medication Management   Determination of Need: Urgent (48 hours)   Options For Referral: No data recorded    CCA Biopsychosocial  Intake/Chief Complaint:  CCA Intake With Chief Complaint CCA Part Two Date: 03/28/20 CCA Part Two Time: 1857 Chief Complaint/Presenting Problem: Patient's denies concerns, however daugher is concerned he has not been taking medicaitons as prescribed and is experiencing worsening psychosis. Patient's Currently Reported Symptoms/Problems: patient denies symptoms of psychosis and is rather coherent and linear.  It is difficult to understand him fully, due to his [place] accent.  Mental Health Symptoms Depression:  Depression: None  Mania:  Mania: None  Anxiety:   Anxiety: None  Psychosis:  Psychosis: None (denies)  Trauma:  Trauma: None  Obsessions:  Obsessions: None  Compulsions:  Compulsions: None  Inattention:  Inattention: None  Hyperactivity/Impulsivity:  Hyperactivity/Impulsivity: N/A  Oppositional/Defiant Behaviors:  Oppositional/Defiant Behaviors: N/A  Emotional Irregularity:  Emotional Irregularity: Intense/inappropriate anger  Other Mood/Personality Symptoms:      Mental Status Exam Appearance and self-care  Stature:  Stature: Average  Weight:  Weight: Average weight  Clothing:  Clothing: Casual  Grooming:  Grooming: Normal  Cosmetic use:  Cosmetic Use: None  Posture/gait:  Posture/Gait: Normal  Motor activity:  Motor Activity: Not Remarkable  Sensorium  Attention:  Attention: Normal  Concentration:  Concentration: Normal  Orientation:  Orientation: Object, Person, Place  Recall/memory:  Recall/Memory: Normal  Affect and Mood  Affect:  Affect: Constricted  Mood:  Mood: Irritable  Relating  Eye contact:  Eye Contact: Normal  Facial expression:  Facial Expression: Responsive  Attitude toward examiner:  Attitude Toward Examiner: Cooperative  Thought and Language  Speech flow: Speech Flow: Loud (loud due to attempts to be  understood with his accent and difficulty communicating clearly.)  Thought content:  Thought Content: Appropriate to Mood and Circumstances, Delusions (delusions per daughter's report)  Preoccupation:  Preoccupations: None  Hallucinations:     Organization:     Transport planner of Knowledge:  Fund of Knowledge: Average  Intelligence:  Intelligence: Average  Abstraction:  Abstraction: Functional  Judgement:  Judgement: Fair  Art therapist:  Reality Testing: Variable  Insight:  Insight: Fair  Decision Making:  Decision Making: Normal  Social Functioning  Social Maturity:  Social Maturity: Responsible  Social Judgement:  Social Judgement: Normal  Stress  Stressors:  Stressors: Family conflict  Coping Ability:  Coping Ability: Research officer, political party Deficits:     Supports:  Supports: Family     Religion: Religion/Spirituality Are You A Religious Person?: Yes What is Your Religious Affiliation?:  (unclear)  Leisure/Recreation: Leisure / Recreation Do You Have Hobbies?: No  Exercise/Diet: Exercise/Diet Do You Exercise?: No Have You Gained or Lost A Significant Amount of Weight in the Past Six Months?: No Do You Follow a Special Diet?: No Do You Have Any Trouble Sleeping?: No   CCA Employment/Education  Employment/Work Situation:    Education:     CCA Family/Childhood History  Family and Relationship History: Family history Marital status: Divorced Divorced, when?: UTA What types of issues is patient dealing with in the relationship?: UTA Additional relationship information: UTA What is your sexual orientation?: UTA Has your sexual activity been affected by drugs, alcohol, medication, or emotional stress?: UTA Does patient have children?: Yes How many children?: 1 How is patient's relationship with their children?: Patient denies problems or concerns "I love my daughter."  Childhood History:  Childhood History Additional childhood history information:  UTA  Child/Adolescent Assessment:  CCA Substance Use  Alcohol/Drug Use: Alcohol / Drug Use Pain Medications: See MAR Prescriptions: See MAR Over the Counter: See MAR History of alcohol / drug use?: No history of alcohol / drug abuse     ASAM's:  Six Dimensions of Multidimensional Assessment  Dimension 1:  Acute Intoxication and/or Withdrawal Potential:      Dimension 2:  Biomedical Conditions and Complications:      Dimension 3:  Emotional, Behavioral, or Cognitive Conditions and Complications:     Dimension 4:  Readiness to Change:     Dimension 5:  Relapse, Continued use, or Continued Problem Potential:     Dimension 6:  Recovery/Living Environment:     ASAM Severity Score:    ASAM Recommended Level of Treatment:     Substance use Disorder (SUD)    Recommendations for Services/Supports/Treatments:    DSM5 Diagnoses: Patient Active Problem List   Diagnosis Date Noted  . Lower GI bleed 07/26/2015  . Acute lower GI bleeding 07/26/2015  . Hematochezia 07/26/2015  . Bipolar I disorder ([place])   . HTN (hypertension), benign   . Diverticulitis of large intestine with abscess without bleeding   . Essential hypertension   . Colonic diverticular  abscess   . Diverticulitis 10/13/2014  . Abscess 10/13/2014  . Erectile dysfunction 01/22/2014  . Tobacco abuse 01/22/2014  . Thrombocytopenia, unspecified ([place]) 06/17/2013  . Hyperlipemia   . Bipolar 2 disorder [facility])     Patient Centered Plan: Patient is on the following Treatment Plan(s):  Psychosis   Referrals to Alternative Service(s): Overnight observation recommended with AM Psych Carolyn Stare , [facility]

## 2020-03-28 NOTE — ED Notes (Signed)
Pt's daughter Denman George, said that pt has paranoid schizophrenic. He keeps leaving her messages, knocking on her door saying crazy stuff, scared for self and kids. Not taking meds and is not like this when he is on his meds. She does not know the name of the medication.  Daughter would like an ACT team to be assigned so that he can have his medications given.  She said that he has not eaten in three days.

## 2020-03-29 ENCOUNTER — Encounter (HOSPITAL_COMMUNITY): Payer: Self-pay | Admitting: Registered Nurse

## 2020-03-29 DIAGNOSIS — Z046 Encounter for general psychiatric examination, requested by authority: Secondary | ICD-10-CM | POA: Insufficient documentation

## 2020-03-29 LAB — VALPROIC ACID LEVEL: Valproic Acid Lvl: <10 ug/mL — ABNORMAL LOW (ref 50.0–100.0)

## 2020-03-29 MED ORDER — STERILE WATER FOR INJECTION IJ SOLN
INTRAMUSCULAR | Status: AC
  Filled 2020-03-29: qty 10

## 2020-03-29 MED ORDER — ZIPRASIDONE MESYLATE 20 MG IM SOLR
20.0000 mg | Freq: Once | INTRAMUSCULAR | Status: AC
  Administered 2020-03-29: 20 mg via INTRAMUSCULAR
  Filled 2020-03-29: qty 20

## 2020-03-29 NOTE — ED Notes (Signed)
Pt DC off unit to home per provider.  Pt alert, cooperative, no s/s of distress. DC information given to and reviewed with pt, pt acknowledged understanding. Belongings given to pt. Pt ambulatory off unit, escorted by SW. SW set up transportation.

## 2020-03-29 NOTE — ED Notes (Signed)
Patient has been pacing and talking to himself all night. Patient became agitated and walked out of the room and reared back his arm as if to hit his sitter. RN responded to patient's threatening posture and tried to verbally de-escalate patient. Patient began yelling at RN to take off her shirt. RN called security as patient is IVC'd and screaming he is "fucking leaving". MD Cardama made aware of situation and 20mg  of Geodon emergently ordered. Security at bedside to help with medication administration.

## 2020-03-29 NOTE — ED Notes (Signed)
Patient continues to have frequent conversations with himself. Patient is not violent but is unorganized. Patient has not rested for long before he gets back up and begins talking in the room. Patient ambulatory around room.

## 2020-03-29 NOTE — BH Assessment (Addendum)
[place] Assessment Progress Note  Per Shuvon Rankin, NP, this pt does not require psychiatric hospitalization at this time.  Pt presents under IVC initiated by pt's daughter and upheld by EDP Pattricia Boss, MD which has been rescinded by Hampton Abbot, MD.  Pt is psychiatrically cleared.  At [facility] request, this writer called Beverly Sessions, pt's outpatient provider, to inquire about his next scheduled appointment.  Call was placed at 11:04 and I spoke to [place].  She reports that pt missed his last appointment on 03/11/2020.  She has scheduled him for an appointment on Monday, 04/04/2020 at 15:15.  This has been included in pt's discharge instructions.  I have informed her that pt's daughter has requested that pt be considered for ACT Team services to improve medication compliance.  [facility] Dyer requests that discharge instructions be faxed to them at [phone], which I have agreed to do.  A social work consult has been ordered to address pt's psychosocial needs.  Pt's nurse, Eustaquio Maize, has been notified.  Jalene Mullet, [place] Triage Specialist [phone]

## 2020-03-29 NOTE — Progress Notes (Addendum)
TOC CM spoke to dtr,[name] to discuss transportation, dtr states she will assist with transportation for pt to get home by taxi. She does not feel comfortable taking him home in her car. Explained pt will need assistance in the home. Gave number for friend, Earnie Larsson # [phone] to pick him up but friend states he was in [place] and it would take awhile before he is able to come. She will make sure to remind him of his appt on Monday for [facility] at 3:15 pm. Pt drives to his appts. [place], [place] ED TOC CM [phone]

## 2020-03-29 NOTE — Consult Note (Signed)
Telepsych Consultation   Reason for Consult:  IVC by daughter with complaints of psychosis Referring Physician:  Janeece Fitting, PA-C Location of Patient: [facility] ED Location of Provider: Other: [facility]  Patient Identification: Bradley [name] MRN:  [id number] Principal Diagnosis: <principal problem not specified> Diagnosis:  Active Problems:   * No active hospital problems. *   Total Time spent with patient: 30 minutes  Subjective:    Per TTS Assessment Note:  Bradley [name] is a 69 y.o. male who presented to [facility] under IVC by daughter, due to recent onset of psychosis that has reportedly worsened over the past 2-3 days.  She reports the following: "He is a danger to himself and others. He believes that he is Jesus and hs "The Power" and that petitioner is the [employer] the daughter frequently to call her the Devil and to threaten her."  Upon assessment, patient is pleasant and cooperative referring to [facility] as "Ma'am" in response to each question.  He isn't aware of concerns his daughter has and he denies signs or symptoms of psychosis.  He denies SI, HI and AVH.  Some responses were difficult to decipher at times, given patient's strong [place] accent.  He is clear that he has no thoughts to harm himself or others.    Patient gave consent for LPC to speak to his daughter, [name]([phone]).  He states, "You are welcome to call her.  I love my daughter."  Patient's daughter states, "I've told the staff and it's in the paperwork."  Patient's mother offered limited additional collateral, outside of stating that her father has been leaving bizarre and threatening messages on her voicemail.  She informed patient that she intended to call the police due to his recent behaviors.  Patient's daughter is unaware of patient's medications or whether he is attending appointments with [facility].  She states he "gets like this" when he has episodes and she is hoping he will get treatment before he declines  further.  She is hoping he will be referred to ACTT services for assistance with taking medications as prescribed.    HPI:  Bradley [name], 69 y.o., male patient seen via tele psych by this provider, consulted with Dr. Dwyane Dee; and chart reviewed on 03/29/20.  On evaluation Bradley [name] reports he does not know why he is in the hospital.  Patient denies calling his daughter and threatening her.  When asked if he has told any one that he was Jesus patient responded "I am not Jesus, My name is Bradley [name]."  Patient states that he lives alone and cares for himself.  States that he cooks most meals and sometimes eat out.  States that he goes to [place] but was unable to give the date of last visit.  States that he takes Depakote but unsure of the dosage; but states that he has taken as ordered.  Patient states that he uses a bus for most transportation.  When asked how he was feeling this morning he states that he had some confusion about being "in this place want to leave."  Patient states that he has got up set but because he is unable to leave.  Patient asked about him talking to himself and if he was hearing voiced and he responded "Yes, I talk to myself, no voice, just myself.  I can stop."  Patient states that he denies wanting to hurt/kill himself or others "No, I don't want to do that to nobody, love my life"   During evaluation Bradley Pilar  [name] is alert/oriented x 2 (self and time) he is unsure where he is and what the place is.  Patient is aware that he lives in [place], [place].  He was also able to give the names of the last 2 presidents and stating that he couldn't remember the name of one but knows he was from [place].  Patient was able to give his correct date of birth but "I can't think of age now, Let see 1952 from 2019 can't say right now.  Patient was calm/cooperative throughout assessment and mood was congruent with affect.  Patient did appear to have some confusion at time but stating he upset  about being in a strange place.  Patient has a very strong accent and difficult to understand when talking fast; has to slow speech down so it can be understood more clearly.  Patient stated it has been many years since he has spoken his native language and would be able to understand enough to speak it now.  He does not appear to be responding to internal/external stimuli or delusional thoughts at this time and denies suicidal/self-harm/homicidal ideation, psychosis, and paranoia.  Patient answered question appropriately.      Past Psychiatric History: Patient unable to give a psychiatric diagnosis but states that he goes to [facility] for outpatient services  Risk to Self:  No Risk to Others:  No Prior Inpatient Therapy:   States once when he was young was given "weed" and ended up having to go to the hospital cause heard voices. Prior Outpatient Therapy:  Yes  Past Medical History:  Past Medical History:  Diagnosis Date  . Arthritis   . Bipolar 2 disorder ([place]) 2005   Also carries diagnosis of schizophrenia.  . Colon adenomas 2009, 2015  . Diabetes mellitus without complication ([place])   . Hyperlipemia   . Hypertension   . Hypogonadism male    Erectile dysfunction  . Lower GI bleed 07/2015  . 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      Past Surgical History:  Procedure Laterality Date  . COLONOSCOPY  2009, 2015  . ENTEROSCOPY N/A 07/28/2015   Procedure: ENTEROSCOPY;  Surgeon: Ladene Artist, MD;  Location: [facility] ENDOSCOPY;  Service: Endoscopy;  Laterality: N/A;   Family History:  Family History  Problem Relation Age of Onset  . Cancer Brother        abdominal CA  . Colon cancer Neg Hx   . Esophageal cancer Neg Hx   . Rectal cancer Neg Hx   . Stomach cancer Neg Hx    Family Psychiatric  History: Unaware Social History:  Social History   Substance and Sexual Activity  Alcohol Use No     Social History   Substance and Sexual  Activity  Drug Use No    Social History   Socioeconomic History  . Marital status: Divorced    Spouse name: Not on file  . Number of children: Not on file  . Years of education: Not on file  . Highest education level: Not on file  Occupational History  . Not on file  Tobacco Use  . Smoking status: Current Every Day Smoker    Packs/day: 1.00    Years: 0.00    Pack years: 0.00    Types: Cigarettes  . Smokeless tobacco: Never Used  Vaping Use  . Vaping Use: Never used  Substance and Sexual Activity  . Alcohol use: No  . Drug use: No  .  Sexual activity: Not on file  Other Topics Concern  . Not on file  Social History Narrative   Patient originally from [place].   Social Determinants of Health   Financial Resource Strain:   . Difficulty of Paying Living Expenses:   Food Insecurity:   . Worried About Charity fundraiser in the Last Year:   . Arboriculturist in the Last Year:   Transportation Needs:   . Film/video editor (Medical):   Marland Kitchen Lack of Transportation (Non-Medical):   Physical Activity:   . Days of Exercise per Week:   . Minutes of Exercise per Session:   Stress:   . Feeling of Stress :   Social Connections:   . Frequency of Communication with Friends and Family:   . Frequency of Social Gatherings with Friends and Family:   . Attends Religious Services:   . Active Member of Clubs or Organizations:   . Attends Archivist Meetings:   Marland Kitchen Marital Status:    Additional Social History:    Allergies:  No Known Allergies  Labs:  Results for orders placed or performed during the hospital encounter of 03/28/20 (from the past 48 hour(s))  Rapid urine drug screen (hospital performed)     Status: None   Collection Time: 03/28/20  2:58 PM  Result Value Ref Range   Opiates NONE DETECTED NONE DETECTED   Cocaine NONE DETECTED NONE DETECTED   Benzodiazepines NONE DETECTED NONE DETECTED   Amphetamines NONE DETECTED NONE DETECTED   Tetrahydrocannabinol  NONE DETECTED NONE DETECTED   Barbiturates NONE DETECTED NONE DETECTED    Comment: (NOTE) DRUG SCREEN FOR MEDICAL PURPOSES ONLY.  IF CONFIRMATION IS NEEDED FOR ANY PURPOSE, NOTIFY LAB WITHIN 5 DAYS.  LOWEST DETECTABLE LIMITS FOR URINE DRUG SCREEN Drug Class                     Cutoff (ng/mL) Amphetamine and metabolites    1000 Barbiturate and metabolites    200 Benzodiazepine                 578 Tricyclics and metabolites     300 Opiates and metabolites        300 Cocaine and metabolites        300 THC                            50 Performed at [facility], [place] [address] [place],  [address]   Comprehensive metabolic panel     Status: Abnormal   Collection Time: 03/28/20  3:25 PM  Result Value Ref Range   Sodium 141 135 - 145 mmol/L   Potassium 4.1 3.5 - 5.1 mmol/L   Chloride 108 98 - 111 mmol/L   CO2 27 22 - 32 mmol/L   Glucose, Bld 101 (H) 70 - 99 mg/dL    Comment: Glucose reference range applies only to samples taken after fasting for at least 8 hours.   BUN 12 8 - 23 mg/dL   Creatinine, Ser 1.00 0.61 - 1.24 mg/dL   Calcium 9.1 8.9 - 10.3 mg/dL   Total Protein 7.8 6.5 - 8.1 g/dL   Albumin 3.9 3.5 - 5.0 g/dL   AST 11 (L) 15 - 41 U/L   ALT 19 0 - 44 U/L   Alkaline Phosphatase 41 38 - 126 U/L   Total Bilirubin 0.7 0.3 - 1.2 mg/dL   GFR  calc non Af Amer >60 >60 mL/min   GFR calc Af Amer >60 >60 mL/min   Anion gap 6 5 - 15    Comment: Performed at [facility], [place] [address] [place], [place] [address]  Ethanol     Status: None   Collection Time: 03/28/20  3:25 PM  Result Value Ref Range   Alcohol, Ethyl (B) <10 <10 mg/dL    Comment: (NOTE) Lowest detectable limit for serum alcohol is 10 mg/dL.  For medical purposes only. Performed at [facility], [place] [address], [place], [place] [address]   Salicylate level     Status: Abnormal   Collection Time: 03/28/20  3:25 PM  Result Value Ref Range    Salicylate Lvl <5.6 (L) 7.0 - 30.0 mg/dL    Comment: Performed at [facility], [place] [address] [place], [place] [address]  Acetaminophen level     Status: Abnormal   Collection Time: 03/28/20  3:25 PM  Result Value Ref Range   Acetaminophen (Tylenol), Serum <10 (L) 10 - 30 ug/mL    Comment: (NOTE) Therapeutic concentrations vary significantly. A range of 10-30 ug/mL  may be an effective concentration for many patients. However, some  are best treated at concentrations outside of this range. Acetaminophen concentrations >150 ug/mL at 4 hours after ingestion  and >50 ug/mL at 12 hours after ingestion are often associated with  toxic reactions.  Performed at [facility], [place] [address] [place], [place] [address]   cbc     Status: Abnormal   Collection Time: 03/28/20  3:25 PM  Result Value Ref Range   WBC 5.7 4.0 - 10.5 K/uL   RBC 5.81 4.22 - 5.81 MIL/uL   Hemoglobin 16.4 13.0 - 17.0 g/dL   HCT 51.5 39 - 52 %   MCV 88.6 80.0 - 100.0 fL   MCH 28.2 26.0 - 34.0 pg   MCHC 31.8 30.0 - 36.0 g/dL   RDW 15.5 11.5 - 15.5 %   Platelets 121 (L) 150 - 400 K/uL   nRBC 0.0 0.0 - 0.2 %    Comment: Performed at [facility], [place] [address] [place] [address], [place], [place] [address]    Medications:  Current Facility-Administered Medications  Medication Dose Route Frequency Provider Last Rate Last Admin  . amLODipine (NORVASC) tablet 10 mg  10 mg Oral Daily Janeece Fitting, PA-C   10 mg at 03/29/20 0134  . hydrochlorothiazide (HYDRODIURIL) tablet 25 mg  25 mg Oral Daily Soto, Beverley Fiedler, PA-C   25 mg at 03/29/20 0135  . lisinopril (ZESTRIL) tablet 40 mg  40 mg Oral Daily Janeece Fitting, PA-C   40 mg at 03/29/20 0134  . metFORMIN (GLUCOPHAGE) tablet 1,000 mg  1,000 mg Oral Daily Soto, Johana, PA-C      . pravastatin (PRAVACHOL) tablet 20 mg  20 mg Oral QHS Soto, Johana, PA-C   20 mg at 03/29/20 0135  . sterile water (preservative free) injection             Current Outpatient Medications  Medication Sig Dispense Refill  . albuterol (PROVENTIL HFA;VENTOLIN HFA) 108 (90 Base) MCG/ACT inhaler Inhale 1-2 puffs into the lungs every 6 (six) hours as needed for wheezing or shortness of breath. 1 Inhaler 0  . amLODipine (NORVASC) 10 MG tablet Take 10 mg by mouth daily.    . dorzolamide-timolol (COSOPT) 22.3-6.8 MG/ML ophthalmic solution Place 1 drop into both eyes daily.     . hydrochlorothiazide (  HYDRODIURIL) 25 MG tablet Take 25 mg by mouth daily.    Marland Kitchen lisinopril (PRINIVIL,ZESTRIL) 40 MG tablet Take 40 mg by mouth daily.    . metFORMIN (GLUCOPHAGE) 1000 MG tablet Take 1,000 mg by mouth daily.    . pravastatin (PRAVACHOL) 20 MG tablet Take 20 mg by mouth at bedtime.    Marland Kitchen acetaminophen (TYLENOL) 500 MG tablet Take 1 tablet (500 mg total) by mouth every 6 (six) hours as needed for mild pain or moderate pain. (Patient not taking: Reported on 03/28/2020) 30 tablet 0  . penicillin v potassium (VEETID) 500 MG tablet Take 1 tablet (500 mg total) by mouth 4 (four) times daily. (Patient not taking: Reported on 03/28/2020) 40 tablet 0    Musculoskeletal: Strength & Muscle Tone: within normal limits Gait & Station: normal Patient leans: N/A  Psychiatric Specialty Exam: Physical Exam Vitals and nursing note reviewed. Exam conducted with a chaperone present.  Constitutional:      Appearance: Normal appearance. He is obese.  HENT:     Head: Normocephalic.  Pulmonary:     Effort: Pulmonary effort is normal.  Musculoskeletal:        General: Normal range of motion.  Neurological:     Mental Status: He is alert.  Psychiatric:        Attention and Perception: Attention and perception normal.        Mood and Affect: Affect normal. Mood is anxious.        Speech: Speech normal.        Behavior: Behavior normal. Agitated: Reporting agitation is related to not being able to leave. Behavior is cooperative.        Thought Content: Thought content is not paranoid  or delusional. Thought content does not include homicidal or suicidal ideation.        Cognition and Memory: Cognition and memory normal.        Judgment: Judgment is impulsive.     Review of Systems  Psychiatric/Behavioral: Agitation: Denies. Behavioral problem: Denies, but has had some verbal outburst and aggressive posturing when wanting to leave the hospital. Confusion: Reports some confusion about when he is in the hospital and being in a strange place. Hallucinations: denies. Self-injury: Denies. Sleep disturbance: Denies. Suicidal ideas: denies. The patient is not nervous/anxious.   All other systems reviewed and are negative.   Blood pressure (!) 190/104, pulse 63, temperature 98.6 F (37 C), temperature source Oral, resp. rate 18, SpO2 93 %.There is no height or weight on file to calculate BMI.  General Appearance: Casual  Eye Contact:  Good  Speech:  Normal Rate and Strong accent  Volume:  Normal  Mood:  Anxious  Affect:  Appropriate and Congruent  Thought Process:  Coherent, Disorganized and Descriptions of Associations: Circumstantial  Orientation:  Other:  Oriented to person and time; but some confusion about place and reason he is there  Thought Content:  denies hallucinations, delusions, and paranoia  Suicidal Thoughts:  No  Homicidal Thoughts:  No  Memory:  Immediate;   Fair Recent;   Fair  Judgement:  Other:  Fair; intact  Insight:  Present  Psychomotor Activity:  Normal  Concentration:  Concentration: Fair and Attention Span: Fair  Recall:  [employer] of Knowledge:  Fair  Language:  Fair  Akathisia:  No  Handed:  Right  AIMS (if indicated):     Assets:  Communication Skills Desire for Improvement Housing Social Support  ADL's:  Intact  Cognition:  WNL  Sleep:      Treatment Plan Summary: Plan Psychiatrically cleare, social work consult to assess if patient able to care for self at home, and when next [facility] appointment is scheluded for  Disposition:   Psychiatrically Cleared  No evidence of imminent risk to self or others at present.   Patient does not meet criteria for psychiatric inpatient admission. Supportive therapy provided about ongoing stressors. Discussed crisis plan, support from social network, calling 911, coming to the Emergency Department, and calling Suicide Hotline.  This service was provided via telemedicine using a 2-way, interactive audio and video technology.  Names of all persons participating in this telemedicine service and their role in this encounter. Name: Earleen Newport Role: NP  Name: Dr. Hampton Abbot Role: Psychiatrist  Name: Bradley [name] Role: Patient   Name:  Role:    Spoke to Dr. Stark Jock informed that patient has been seen by psychiatry and psychiatrically cleared.  Did order a Valproic acid level to assure patient has been taking and is at therapeutic level.  Also ordered a social work consult to see if patient will need APS to eval him in his home setting and to check when his next [facility] appointment is scheduled for.    Toniqua Melamed, NP 03/29/2020 9:42 AM

## 2020-03-29 NOTE — Discharge Instructions (Signed)
For your mental health needs, you are advised to continue treatment with [facility].  You have an appointment scheduled for Monday, April 04, 2020 at 3:15 pm:       [address] [place]      [place], [place] [address]      [phone]

## 2020-03-29 NOTE — ED Notes (Signed)
Pt walked up to me with fist balled up as if to hit me but he did not however he kept saying "when can I go home" " Im leaving" then security showed up and he went back to his room

## 2020-03-30 NOTE — Progress Notes (Signed)
GU Location of Tumor / Histology: prostatic adenocarcinoma  If Prostate Cancer, Gleason Score is (3 + 4) and PSA is (6.10). Prostate volume: 83.25 grams.  Bradley [name] presented  months ago with signs/symptoms of:   Biopsies of prostate (if applicable) revealed:    Past/Anticipated interventions by urology, if any: prostate biopsy, lost to follow up, repeat biopsy, referral to Dr. Tammi Klippel   Past/Anticipated interventions by medical oncology, if any: no  Weight changes, if any: no  Bowel/Bladder complaints, if any: Reports urinary frequency and nocturia.    Nausea/Vomiting, if any: no  Pain issues, if any:    SAFETY ISSUES:  Prior radiation?   Pacemaker/ICD?   Possible current pregnancy? no, male patient  Is the patient on methotrexate?   Current Complaints / other details:  69 year old male. Single. Lives alone. Smokes 1 ppd.

## 2020-04-01 ENCOUNTER — Other Ambulatory Visit: Payer: Self-pay

## 2020-04-01 ENCOUNTER — Encounter: Payer: Self-pay | Admitting: Radiation Oncology

## 2020-04-01 ENCOUNTER — Encounter: Payer: Self-pay | Admitting: Medical Oncology

## 2020-04-01 ENCOUNTER — Ambulatory Visit
Admission: RE | Admit: 2020-04-01 | Discharge: 2020-04-01 | Disposition: A | Discharge status: Home / Self Care | Payer: Medicare HMO | Source: Ambulatory Visit | Attending: Radiation Oncology | Admitting: Radiation Oncology

## 2020-04-01 DIAGNOSIS — F17210 Nicotine dependence, cigarettes, uncomplicated: Secondary | ICD-10-CM | POA: Insufficient documentation

## 2020-04-01 DIAGNOSIS — Z800 Family history of malignant neoplasm of digestive organs: Secondary | ICD-10-CM | POA: Insufficient documentation

## 2020-04-01 DIAGNOSIS — F3181 Bipolar II disorder: Secondary | ICD-10-CM | POA: Diagnosis not present

## 2020-04-01 DIAGNOSIS — E785 Hyperlipidemia, unspecified: Secondary | ICD-10-CM | POA: Insufficient documentation

## 2020-04-01 DIAGNOSIS — I10 Essential (primary) hypertension: Secondary | ICD-10-CM | POA: Insufficient documentation

## 2020-04-01 DIAGNOSIS — D696 Thrombocytopenia, unspecified: Secondary | ICD-10-CM | POA: Diagnosis not present

## 2020-04-01 DIAGNOSIS — Z79899 Other long term (current) drug therapy: Secondary | ICD-10-CM | POA: Insufficient documentation

## 2020-04-01 DIAGNOSIS — Z7984 Long term (current) use of oral hypoglycemic drugs: Secondary | ICD-10-CM | POA: Insufficient documentation

## 2020-04-01 DIAGNOSIS — F209 Schizophrenia, unspecified: Secondary | ICD-10-CM | POA: Insufficient documentation

## 2020-04-01 DIAGNOSIS — C61 Malignant neoplasm of prostate: Secondary | ICD-10-CM

## 2020-04-01 DIAGNOSIS — E119 Type 2 diabetes mellitus without complications: Secondary | ICD-10-CM | POA: Insufficient documentation

## 2020-04-01 DIAGNOSIS — M129 Arthropathy, unspecified: Secondary | ICD-10-CM | POA: Diagnosis not present

## 2020-04-01 DIAGNOSIS — N529 Male erectile dysfunction, unspecified: Secondary | ICD-10-CM | POA: Insufficient documentation

## 2020-04-01 NOTE — Progress Notes (Signed)
Radiation Oncology         [phone] ________________________________  Initial outpatient Consultation  Name: Bradley [name] MRN: [id number]  Date: 04/01/2020  DOB: [birth year]  CX:[email or web address], [facility]  Bradley Hughs, MD   REFERRING PHYSICIAN: Ardis Hughs, MD  DIAGNOSIS: 69 y.o. gentleman with Stage T1c adenocarcinoma of the prostate with Gleason score of 3+4, and PSA of 5.5    ICD-10-CM   1. Malignant neoplasm of prostate ([place])  C61     HISTORY OF PRESENT ILLNESS: Bradley [name] is a 69 y.o. male with a diagnosis of prostate cancer. He has a longstanding history of elevated PSA since 2017 with a PSA at 4.08 in May 2017 but decreased to 2.08 in August 2017.  The PSA increased to 3.8 in November 2017 and up to 4.93 in November 2019 with his primary care provider, Bradley Rued, NP.  Accordingly, he was referred for evaluation in urology by Dr. Louis [name] on 12/03/2018,  digital rectal examination was performed at that time revealing no nodules. A repeat PSA performed that day showed further elevation to 5.41.  Appointments were made for the patient to follow up, but he did not show until 06/2019 when his PSA was noted to be further elevated at 6.1.  The patient proceeded to transrectal ultrasound with 12 biopsies of the prostate on 12/21/2019.  The prostate volume measured 83.25 cc.  PSA was repeated that day and had decreased slightly to 5.5.  Out of 12 core biopsies, 6 were positive.  The maximum Gleason score was 3+4, and this was seen in the right base (with PNI), right mid, right base lateral (with PNI), and right mid lateral. Additionally, small foci of Gleason 3+3 were seen in the left apex lateral and right apex.  The patient reviewed the biopsy results with his urologist and he has kindly been referred today for discussion of potential radiation treatment options.   PREVIOUS RADIATION THERAPY: No  PAST MEDICAL HISTORY:  Past Medical History:    Diagnosis Date  . Arthritis   . Bipolar 2 disorder ([place]) 2005   Also carries diagnosis of schizophrenia.  . Colon adenomas 2009, 2015  . Diabetes mellitus without complication ([place])   . Hyperlipemia   . Hypertension   . Hypogonadism male    Erectile dysfunction  . Lower GI bleed 07/2015  . 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        PAST SURGICAL HISTORY: Past Surgical History:  Procedure Laterality Date  . COLONOSCOPY  2009, 2015  . ENTEROSCOPY N/A 07/28/2015   Procedure: ENTEROSCOPY;  Surgeon: Ladene Artist, MD;  Location: [facility] ENDOSCOPY;  Service: Endoscopy;  Laterality: N/A;    FAMILY HISTORY:  Family History  Problem Relation Age of Onset  . Cancer Brother        abdominal CA  . Colon cancer Neg Hx   . Esophageal cancer Neg Hx   . Rectal cancer Neg Hx   . Stomach cancer Neg Hx     SOCIAL HISTORY:  Social History   Socioeconomic History  . Marital status: Divorced    Spouse name: Not on file  . Number of children: Not on file  . Years of education: Not on file  . Highest education level: Not on file  Occupational History  . Not on file  Tobacco Use  . Smoking status: Current Every Day Smoker    Packs/day: 1.00    Years: 0.00  Pack years: 0.00    Types: Cigarettes  . Smokeless tobacco: Never Used  Vaping Use  . Vaping Use: Never used  Substance and Sexual Activity  . Alcohol use: No  . Drug use: No  . Sexual activity: Not on file  Other Topics Concern  . Not on file  Social History Narrative   Patient originally from [place].   Social Determinants of Health   Financial Resource Strain:   . Difficulty of Paying Living Expenses:   Food Insecurity:   . Worried About Charity fundraiser in the Last Year:   . Arboriculturist in the Last Year:   Transportation Needs:   . Film/video editor (Medical):   Marland Kitchen Lack of Transportation (Non-Medical):   Physical Activity:   . Days of Exercise per Week:    . Minutes of Exercise per Session:   Stress:   . Feeling of Stress :   Social Connections:   . Frequency of Communication with Friends and Family:   . Frequency of Social Gatherings with Friends and Family:   . Attends Religious Services:   . Active Member of Clubs or Organizations:   . Attends Archivist Meetings:   Marland Kitchen Marital Status:   Intimate Partner Violence:   . Fear of Current or Ex-Partner:   . Emotionally Abused:   Marland Kitchen Physically Abused:   . Sexually Abused:     ALLERGIES: Patient has no known allergies.  MEDICATIONS:  Current Outpatient Medications  Medication Sig Dispense Refill  . acetaminophen (TYLENOL) 500 MG tablet Take 1 tablet (500 mg total) by mouth every 6 (six) hours as needed for mild pain or moderate pain. (Patient not taking: Reported on 03/28/2020) 30 tablet 0  . albuterol (PROVENTIL HFA;VENTOLIN HFA) 108 (90 Base) MCG/ACT inhaler Inhale 1-2 puffs into the lungs every 6 (six) hours as needed for wheezing or shortness of breath. 1 Inhaler 0  . amLODipine (NORVASC) 10 MG tablet Take 10 mg by mouth daily.    . dorzolamide-timolol (COSOPT) 22.3-6.8 MG/ML ophthalmic solution Place 1 drop into both eyes daily.     . hydrochlorothiazide (HYDRODIURIL) 25 MG tablet Take 25 mg by mouth daily.    Marland Kitchen lisinopril (PRINIVIL,ZESTRIL) 40 MG tablet Take 40 mg by mouth daily.    . metFORMIN (GLUCOPHAGE) 1000 MG tablet Take 1,000 mg by mouth daily.    . penicillin v potassium (VEETID) 500 MG tablet Take 1 tablet (500 mg total) by mouth 4 (four) times daily. (Patient not taking: Reported on 03/28/2020) 40 tablet 0  . pravastatin (PRAVACHOL) 20 MG tablet Take 20 mg by mouth at bedtime.     No current facility-administered medications for this encounter.    REVIEW OF SYSTEMS:  On review of systems, the patient reports that he is doing well overall. He denies any chest pain, shortness of breath, cough, fevers, chills, night sweats, unintended weight changes. He denies any bowel  disturbances, and denies abdominal pain, nausea or vomiting. He denies any new musculoskeletal or joint aches or pains. His IPSS was 3, indicating mild urinary symptoms. His SHIM was 12, indicating he has moderate erectile dysfunction. A complete review of systems is obtained and is otherwise negative.    PHYSICAL EXAM:  Wt Readings from Last 3 Encounters:  01/28/19 240 lb (108.9 kg)  01/14/19 240 lb (108.9 kg)  05/23/18 240 lb (108.9 kg)   Temp Readings from Last 3 Encounters:  03/29/20 97.8 F (36.6 C) (Oral)  05/03/19 98.4 F (  36.9 C) (Oral)  01/28/19 98.7 F (37.1 C)   BP Readings from Last 3 Encounters:  03/29/20 (!) 155/92  05/03/19 (!) 142/84  01/28/19 (!) 144/78   Pulse Readings from Last 3 Encounters:  03/29/20 (!) 51  05/03/19 72  01/28/19 69    /10  In general this is a well appearing [place] male in no acute distress. He is alert and oriented x4 and appropriate throughout the examination. HEENT reveals that the patient is normocephalic, atraumatic. EOMs are intact. PERRLA. Skin is intact without any evidence of gross lesions. Cardiovascular exam reveals a regular rate and rhythm, no clicks rubs or murmurs are auscultated. Chest is clear to auscultation bilaterally. Lymphatic assessment is performed and does not reveal any adenopathy in the cervical, supraclavicular, axillary, or inguinal chains. Abdomen has active bowel sounds in all quadrants and is intact. The abdomen is soft, non tender, non distended. Lower extremities are negative for pretibial pitting edema, deep calf tenderness, cyanosis or clubbing.   KPS = 90  100 - Normal; no complaints; no evidence of disease. 90   - Able to carry on normal activity; minor signs or symptoms of disease. 80   - Normal activity with effort; some signs or symptoms of disease. 37   - Cares for self; unable to carry on normal activity or to do active work. 60   - Requires occasional assistance, but is able to care for most of  his personal needs. 50   - Requires considerable assistance and frequent medical care. 60   - Disabled; requires special care and assistance. 62   - Severely disabled; hospital admission is indicated although death not imminent. 38   - Very sick; hospital admission necessary; active supportive treatment necessary. 10   - Moribund; fatal processes progressing rapidly. 0     - Dead  Karnofsky DA, Abelmann [place], Craver LS and Burchenal [facility] [phone]) The use of the nitrogen mustards in the palliative treatment of carcinoma: with particular reference to bronchogenic carcinoma Cancer 1 [phone]  LABORATORY DATA:  Lab Results  Component Value Date   WBC 5.7 03/28/2020   HGB 16.4 03/28/2020   HCT 51.5 03/28/2020   MCV 88.6 03/28/2020   PLT 121 (L) 03/28/2020   Lab Results  Component Value Date   NA 141 03/28/2020   K 4.1 03/28/2020   CL 108 03/28/2020   CO2 27 03/28/2020   Lab Results  Component Value Date   ALT 19 03/28/2020   AST 11 (L) 03/28/2020   ALKPHOS 41 03/28/2020   BILITOT 0.7 03/28/2020     RADIOGRAPHY: No results found.    IMPRESSION/PLAN: 1. 69 y.o. gentleman with Stage T1c adenocarcinoma of the prostate with Gleason Score of 3+4, and PSA of 5.5. We discussed the patient's workup and outlined the nature of prostate cancer in this setting. The patient's T stage, Gleason's score, and PSA put him into the favorable intermediate risk group. Accordingly, he is eligible for a variety of potential treatment options including brachytherapy, 5.5 weeks of external radiation, or prostatectomy. We discussed the available radiation techniques, and focused on the details and logistics and delivery. The patient is not felt to be an ideal candidate for brachytherapy with a prostate volume of 83 cc. We therefore discussed and outlined the risks, benefits, short and long-term effects associated with daily external beam radiotherapy and compared and contrasted these with prostatectomy. We discussed  the role of SpaceOAR in reducing the rectal toxicity associated with radiotherapy.  He was  encouraged to ask questions that were answered to his stated satisfaction.  At the conclusion of our conversation, the patient is interested in moving forward with a 5-1/2-week course of daily external beam radiotherapy. We will share our discussion with Dr. Louis [name] and make arrangements for fiducial markers and SpaceOAR gel placement, first available, prior to CT simulation, to reduce rectal toxicity from radiotherapy.The patient appears to have a good understanding of his disease and our treatment recommendations which are of curative intent and is in agreement with the stated plan.  Therefore, we will move forward with treatment planning accordingly, in anticipation of beginning IMRT in the near future.  We enjoyed meeting him today and look forward to continuing to participate in his care.    Nicholos Johns, PA-C    Tyler Pita, MD  [facility] Direct Dial: [phone]  Fax: [phone] [email or web address]  Skype  [email or web address]   This document serves as a record of services personally performed by Tyler Pita, MD and Freeman Caldron, PA-C. It was created on their behalf by Wilburn Mylar, a trained medical scribe. The creation of this record is based on the scribe's personal observations and the provider's statements to them. This document has been checked and approved by the attending provider.

## 2020-04-04 LAB — GLUCOSE, CAPILLARY: Glucose-Capillary: 104 mg/dL — ABNORMAL HIGH (ref 70–99)

## 2020-04-05 ENCOUNTER — Other Ambulatory Visit: Payer: Self-pay | Admitting: Urology

## 2020-04-05 ENCOUNTER — Telehealth: Payer: Self-pay | Admitting: *Deleted

## 2020-04-05 DIAGNOSIS — C61 Malignant neoplasm of prostate: Secondary | ICD-10-CM

## 2020-04-05 NOTE — Telephone Encounter (Signed)
Called patient to inform of appt. For fid. markers and space oar gel placement on 04-12-20 - arrival time- 2 pm @ [facility] and his sim will be on August 31 @ Dr. Johny Shears [facility], lvm for a return call

## 2020-04-05 NOTE — Progress Notes (Signed)
Introduced myself as the prostate nurse navigator and discussed my role. He was scheduled for consult 7/13 but no showed so here today to discuss his treatment options. After discussion with Dr. Tammi Klippel and Ailene Ards, PA, he has chosen 5.5 weeks of radiation. No barriers to care identified. I gave him my business card and asked him to call me with questions or concerns.

## 2020-04-18 ENCOUNTER — Telehealth: Payer: Self-pay | Admitting: *Deleted

## 2020-04-18 NOTE — Telephone Encounter (Signed)
Called patient's daughter, [name] to ask question, lvm for a return call

## 2020-04-18 NOTE — Telephone Encounter (Signed)
Returned patient's phone call, spoke with patient 

## 2020-04-19 ENCOUNTER — Ambulatory Visit: Payer: Medicare HMO | Admitting: Radiation Oncology

## 2020-04-19 ENCOUNTER — Telehealth: Payer: Self-pay | Admitting: *Deleted

## 2020-04-19 NOTE — Telephone Encounter (Signed)
CALLED PATIENT'S DAUGHTER [name] TO ASK QUESTIONS, LVM FOR A RETURN CALL

## 2020-04-20 ENCOUNTER — Telehealth: Payer: Self-pay | Admitting: *Deleted

## 2020-04-20 NOTE — Telephone Encounter (Signed)
RETURNED PATIENT'S PHONE CALL, LVM FOR A RETURN CALL 

## 2020-04-28 ENCOUNTER — Telehealth: Payer: Self-pay | Admitting: *Deleted

## 2020-04-28 NOTE — Telephone Encounter (Signed)
Called patient to remind of sim and MRI for 05-03-20, spoke with patient and he is aware of these appts.

## 2020-04-29 ENCOUNTER — Encounter: Payer: Self-pay | Admitting: Medical Oncology

## 2020-05-03 ENCOUNTER — Ambulatory Visit (HOSPITAL_COMMUNITY)
Admission: RE | Admit: 2020-05-03 | Discharge: 2020-05-03 | Disposition: A | Discharge status: Home / Self Care | Payer: Medicare HMO | Source: Ambulatory Visit | Attending: Urology | Admitting: Urology

## 2020-05-03 ENCOUNTER — Encounter: Payer: Self-pay | Admitting: Medical Oncology

## 2020-05-03 ENCOUNTER — Other Ambulatory Visit: Payer: Self-pay

## 2020-05-03 ENCOUNTER — Ambulatory Visit
Admission: RE | Admit: 2020-05-03 | Discharge: 2020-05-03 | Disposition: A | Discharge status: Home / Self Care | Payer: Medicare HMO | Source: Ambulatory Visit | Attending: Radiation Oncology | Admitting: Radiation Oncology

## 2020-05-03 DIAGNOSIS — C61 Malignant neoplasm of prostate: Secondary | ICD-10-CM | POA: Diagnosis present

## 2020-05-03 DIAGNOSIS — Z510 Encounter for antineoplastic radiation therapy: Secondary | ICD-10-CM | POA: Insufficient documentation

## 2020-05-03 NOTE — Progress Notes (Signed)
Spoke with patient to follow up on transportation. He states he has gotten his car repaired and does not need transportation at this time.

## 2020-05-05 NOTE — Progress Notes (Signed)
  Radiation Oncology         [phone] ________________________________  Name: Bradley [name] MRN: [id number]  Date: 05/03/2020  DOB: 1951-07-29  SIMULATION AND TREATMENT PLANNING NOTE    ICD-10-CM   1. Malignant neoplasm of prostate ([place])  C61     DIAGNOSIS:  69 y.o. gentleman with Stage T1c adenocarcinoma of the prostate with Gleason score of 3+4, and PSA of 5.5  NARRATIVE:  The patient was brought to the [facility].  Identity was confirmed.  All relevant records and images related to the planned course of therapy were reviewed.  The patient freely provided informed written consent to proceed with treatment after reviewing the details related to the planned course of therapy. The consent form was witnessed and verified by the simulation staff.  Then, the patient was set-up in a stable reproducible supine position for radiation therapy.  A vacuum lock pillow device was custom fabricated to position his legs in a reproducible immobilized position.  Then, I performed a urethrogram under sterile conditions to identify the prostatic apex.  CT images were obtained.  Surface markings were placed.  The CT images were loaded into the planning software.  Then the prostate target and avoidance structures including the rectum, bladder, bowel and hips were contoured.  Treatment planning then occurred.  The radiation prescription was entered and confirmed.  A total of one complex treatment devices was fabricated. I have requested : Intensity Modulated Radiotherapy (IMRT) is medically necessary for this case for the following reason:  Rectal sparing.Marland Kitchen  PLAN:  The patient will receive 70 Gy in 28 fractions.  ________________________________  Sheral Apley Tammi Klippel, M.D.

## 2020-05-09 ENCOUNTER — Telehealth: Payer: Self-pay | Admitting: Radiation Oncology

## 2020-05-09 NOTE — Telephone Encounter (Signed)
Received voicemail message from patient requesting a callback about appointments specially PUT on Friday. Phoned back. No answer. Left voicemail message detailing appointments for this week to include PUT on Friday. Provided my direct number for future questions or needs.

## 2020-05-12 ENCOUNTER — Ambulatory Visit
Admission: RE | Admit: 2020-05-12 | Discharge: 2020-05-12 | Disposition: A | Discharge status: Home / Self Care | Payer: Medicare HMO | Source: Ambulatory Visit | Attending: Radiation Oncology | Admitting: Radiation Oncology

## 2020-05-12 ENCOUNTER — Other Ambulatory Visit: Payer: Self-pay

## 2020-05-12 DIAGNOSIS — Z510 Encounter for antineoplastic radiation therapy: Secondary | ICD-10-CM | POA: Diagnosis not present

## 2020-05-13 ENCOUNTER — Other Ambulatory Visit: Payer: Self-pay

## 2020-05-13 ENCOUNTER — Encounter: Payer: Self-pay | Admitting: Medical Oncology

## 2020-05-13 ENCOUNTER — Ambulatory Visit
Admission: RE | Admit: 2020-05-13 | Discharge: 2020-05-13 | Disposition: A | Discharge status: Home / Self Care | Payer: Medicare HMO | Source: Ambulatory Visit | Attending: Radiation Oncology | Admitting: Radiation Oncology

## 2020-05-13 DIAGNOSIS — Z510 Encounter for antineoplastic radiation therapy: Secondary | ICD-10-CM | POA: Diagnosis not present

## 2020-05-13 NOTE — Progress Notes (Signed)
Spoke with patient to let him know I have set him with transportation for his Monday treatment. I informed him, he will receive a call from them. I asked him to let me, Bradley [name] or Bradley [name] know if he needs transportation after Monday. He voiced understanding and appreciation.

## 2020-05-16 ENCOUNTER — Encounter: Payer: Self-pay | Admitting: Medical Oncology

## 2020-05-16 ENCOUNTER — Other Ambulatory Visit: Payer: Self-pay

## 2020-05-16 ENCOUNTER — Ambulatory Visit
Admission: RE | Admit: 2020-05-16 | Discharge: 2020-05-16 | Disposition: A | Discharge status: Home / Self Care | Payer: Medicare HMO | Source: Ambulatory Visit | Attending: Radiation Oncology | Admitting: Radiation Oncology

## 2020-05-16 DIAGNOSIS — Z510 Encounter for antineoplastic radiation therapy: Secondary | ICD-10-CM | POA: Diagnosis not present

## 2020-05-16 NOTE — Progress Notes (Signed)
Rider waiver and release of liability form signed by patient and scanned into his record. I spoke with transportation and they will set patient up for rides. Patient is aware.

## 2020-05-17 ENCOUNTER — Ambulatory Visit
Admission: RE | Admit: 2020-05-17 | Discharge: 2020-05-17 | Disposition: A | Discharge status: Home / Self Care | Payer: Medicare HMO | Source: Ambulatory Visit | Attending: Radiation Oncology | Admitting: Radiation Oncology

## 2020-05-17 DIAGNOSIS — Z510 Encounter for antineoplastic radiation therapy: Secondary | ICD-10-CM | POA: Diagnosis not present

## 2020-05-18 ENCOUNTER — Ambulatory Visit
Admission: RE | Admit: 2020-05-18 | Discharge: 2020-05-18 | Disposition: A | Discharge status: Home / Self Care | Payer: Medicare HMO | Source: Ambulatory Visit | Attending: Radiation Oncology | Admitting: Radiation Oncology

## 2020-05-18 ENCOUNTER — Other Ambulatory Visit: Payer: Self-pay

## 2020-05-18 DIAGNOSIS — Z510 Encounter for antineoplastic radiation therapy: Secondary | ICD-10-CM | POA: Diagnosis not present

## 2020-05-19 ENCOUNTER — Encounter: Payer: Self-pay | Admitting: Medical Oncology

## 2020-05-19 ENCOUNTER — Ambulatory Visit
Admission: RE | Admit: 2020-05-19 | Discharge: 2020-05-19 | Disposition: A | Discharge status: Home / Self Care | Payer: Medicare HMO | Source: Ambulatory Visit | Attending: Radiation Oncology | Admitting: Radiation Oncology

## 2020-05-19 DIAGNOSIS — Z510 Encounter for antineoplastic radiation therapy: Secondary | ICD-10-CM | POA: Diagnosis not present

## 2020-05-19 NOTE — Progress Notes (Signed)
Returned call to patient and he just wanted to voice his appreciation for the transportion services. He states things are going well.

## 2020-05-20 ENCOUNTER — Ambulatory Visit
Admission: RE | Admit: 2020-05-20 | Discharge: 2020-05-20 | Disposition: A | Discharge status: Home / Self Care | Payer: Medicare HMO | Source: Ambulatory Visit | Attending: Radiation Oncology | Admitting: Radiation Oncology

## 2020-05-20 DIAGNOSIS — Z510 Encounter for antineoplastic radiation therapy: Secondary | ICD-10-CM | POA: Insufficient documentation

## 2020-05-20 DIAGNOSIS — C61 Malignant neoplasm of prostate: Secondary | ICD-10-CM | POA: Diagnosis present

## 2020-05-23 ENCOUNTER — Ambulatory Visit
Admission: RE | Admit: 2020-05-23 | Discharge: 2020-05-23 | Disposition: A | Discharge status: Home / Self Care | Payer: Medicare HMO | Source: Ambulatory Visit | Attending: Radiation Oncology | Admitting: Radiation Oncology

## 2020-05-23 ENCOUNTER — Telehealth: Payer: Self-pay | Admitting: Radiation Oncology

## 2020-05-23 DIAGNOSIS — Z510 Encounter for antineoplastic radiation therapy: Secondary | ICD-10-CM | POA: Diagnosis not present

## 2020-05-23 NOTE — Telephone Encounter (Signed)
Received voicemail message from patient requesting return call with treatment appointment time for today. Phoned patient back promptly. No answer. Left voicemail message that his appointment time is 1345 today. Encouraged patient to phone back with any further questions.

## 2020-05-24 ENCOUNTER — Other Ambulatory Visit: Payer: Self-pay

## 2020-05-24 ENCOUNTER — Ambulatory Visit
Admission: RE | Admit: 2020-05-24 | Discharge: 2020-05-24 | Disposition: A | Discharge status: Home / Self Care | Payer: Medicare HMO | Source: Ambulatory Visit | Attending: Radiation Oncology | Admitting: Radiation Oncology

## 2020-05-24 DIAGNOSIS — Z510 Encounter for antineoplastic radiation therapy: Secondary | ICD-10-CM | POA: Diagnosis not present

## 2020-05-25 ENCOUNTER — Ambulatory Visit
Admission: RE | Admit: 2020-05-25 | Discharge: 2020-05-25 | Disposition: A | Discharge status: Home / Self Care | Payer: Medicare HMO | Source: Ambulatory Visit | Attending: Radiation Oncology | Admitting: Radiation Oncology

## 2020-05-25 DIAGNOSIS — Z510 Encounter for antineoplastic radiation therapy: Secondary | ICD-10-CM | POA: Diagnosis not present

## 2020-05-26 ENCOUNTER — Other Ambulatory Visit: Payer: Self-pay

## 2020-05-26 ENCOUNTER — Ambulatory Visit
Admission: RE | Admit: 2020-05-26 | Discharge: 2020-05-26 | Disposition: A | Discharge status: Home / Self Care | Payer: Medicare HMO | Source: Ambulatory Visit | Attending: Radiation Oncology | Admitting: Radiation Oncology

## 2020-05-26 DIAGNOSIS — Z510 Encounter for antineoplastic radiation therapy: Secondary | ICD-10-CM | POA: Diagnosis not present

## 2020-05-27 ENCOUNTER — Ambulatory Visit
Admission: RE | Admit: 2020-05-27 | Discharge: 2020-05-27 | Disposition: A | Discharge status: Home / Self Care | Payer: Medicare HMO | Source: Ambulatory Visit | Attending: Radiation Oncology | Admitting: Radiation Oncology

## 2020-05-27 DIAGNOSIS — Z510 Encounter for antineoplastic radiation therapy: Secondary | ICD-10-CM | POA: Diagnosis not present

## 2020-05-30 ENCOUNTER — Ambulatory Visit
Admission: RE | Admit: 2020-05-30 | Discharge: 2020-05-30 | Disposition: A | Discharge status: Home / Self Care | Payer: Medicare HMO | Source: Ambulatory Visit | Attending: Radiation Oncology | Admitting: Radiation Oncology

## 2020-05-30 ENCOUNTER — Encounter: Payer: Self-pay | Admitting: Medical Oncology

## 2020-05-30 ENCOUNTER — Other Ambulatory Visit: Payer: Self-pay

## 2020-05-30 DIAGNOSIS — Z510 Encounter for antineoplastic radiation therapy: Secondary | ICD-10-CM | POA: Diagnosis not present

## 2020-05-30 NOTE — Progress Notes (Signed)
Patient called stating he will need transportation this week but after Friday 10/15, he will have his car back. Transportation notified.

## 2020-05-31 ENCOUNTER — Other Ambulatory Visit: Payer: Self-pay

## 2020-05-31 ENCOUNTER — Ambulatory Visit
Admission: RE | Admit: 2020-05-31 | Discharge: 2020-05-31 | Disposition: A | Discharge status: Home / Self Care | Payer: Medicare HMO | Source: Ambulatory Visit | Attending: Radiation Oncology | Admitting: Radiation Oncology

## 2020-05-31 ENCOUNTER — Encounter: Payer: Self-pay | Admitting: Medical Oncology

## 2020-05-31 DIAGNOSIS — Z510 Encounter for antineoplastic radiation therapy: Secondary | ICD-10-CM | POA: Diagnosis not present

## 2020-05-31 NOTE — Progress Notes (Signed)
Patient called to confirm he has transportation for this week. I informed him, I received a written confirmation stating they will pick him up for treatment thru Friday. He voiced understanding and appreciation.

## 2020-06-01 ENCOUNTER — Other Ambulatory Visit: Payer: Self-pay

## 2020-06-01 ENCOUNTER — Ambulatory Visit
Admission: RE | Admit: 2020-06-01 | Discharge: 2020-06-01 | Disposition: A | Discharge status: Home / Self Care | Payer: Medicare HMO | Source: Ambulatory Visit | Attending: Radiation Oncology | Admitting: Radiation Oncology

## 2020-06-01 DIAGNOSIS — Z510 Encounter for antineoplastic radiation therapy: Secondary | ICD-10-CM | POA: Diagnosis not present

## 2020-06-02 ENCOUNTER — Ambulatory Visit
Admission: RE | Admit: 2020-06-02 | Discharge: 2020-06-02 | Disposition: A | Discharge status: Home / Self Care | Payer: Medicare HMO | Source: Ambulatory Visit | Attending: Radiation Oncology | Admitting: Radiation Oncology

## 2020-06-02 ENCOUNTER — Other Ambulatory Visit: Payer: Self-pay

## 2020-06-02 DIAGNOSIS — Z510 Encounter for antineoplastic radiation therapy: Secondary | ICD-10-CM | POA: Diagnosis not present

## 2020-06-03 ENCOUNTER — Other Ambulatory Visit: Payer: Self-pay

## 2020-06-03 ENCOUNTER — Ambulatory Visit
Admission: RE | Admit: 2020-06-03 | Discharge: 2020-06-03 | Disposition: A | Discharge status: Home / Self Care | Payer: Medicare HMO | Source: Ambulatory Visit | Attending: Radiation Oncology | Admitting: Radiation Oncology

## 2020-06-03 DIAGNOSIS — Z510 Encounter for antineoplastic radiation therapy: Secondary | ICD-10-CM | POA: Diagnosis not present

## 2020-06-06 ENCOUNTER — Ambulatory Visit
Admission: RE | Admit: 2020-06-06 | Discharge: 2020-06-06 | Disposition: A | Discharge status: Home / Self Care | Payer: Medicare HMO | Source: Ambulatory Visit | Attending: Radiation Oncology | Admitting: Radiation Oncology

## 2020-06-06 DIAGNOSIS — Z510 Encounter for antineoplastic radiation therapy: Secondary | ICD-10-CM | POA: Diagnosis not present

## 2020-06-07 ENCOUNTER — Ambulatory Visit
Admission: RE | Admit: 2020-06-07 | Discharge: 2020-06-07 | Disposition: A | Discharge status: Home / Self Care | Payer: Medicare HMO | Source: Ambulatory Visit | Attending: Radiation Oncology | Admitting: Radiation Oncology

## 2020-06-07 DIAGNOSIS — Z510 Encounter for antineoplastic radiation therapy: Secondary | ICD-10-CM | POA: Diagnosis not present

## 2020-06-08 ENCOUNTER — Ambulatory Visit
Admission: RE | Admit: 2020-06-08 | Discharge: 2020-06-08 | Disposition: A | Discharge status: Home / Self Care | Payer: Medicare HMO | Source: Ambulatory Visit | Attending: Radiation Oncology | Admitting: Radiation Oncology

## 2020-06-08 DIAGNOSIS — Z510 Encounter for antineoplastic radiation therapy: Secondary | ICD-10-CM | POA: Diagnosis not present

## 2020-06-09 ENCOUNTER — Ambulatory Visit
Admission: RE | Admit: 2020-06-09 | Discharge: 2020-06-09 | Disposition: A | Discharge status: Home / Self Care | Payer: Medicare HMO | Source: Ambulatory Visit | Attending: Radiation Oncology | Admitting: Radiation Oncology

## 2020-06-09 DIAGNOSIS — Z510 Encounter for antineoplastic radiation therapy: Secondary | ICD-10-CM | POA: Diagnosis not present

## 2020-06-10 ENCOUNTER — Other Ambulatory Visit: Payer: Self-pay

## 2020-06-10 ENCOUNTER — Ambulatory Visit
Admission: RE | Admit: 2020-06-10 | Discharge: 2020-06-10 | Disposition: A | Discharge status: Home / Self Care | Payer: Medicare HMO | Source: Ambulatory Visit | Attending: Radiation Oncology | Admitting: Radiation Oncology

## 2020-06-10 DIAGNOSIS — Z510 Encounter for antineoplastic radiation therapy: Secondary | ICD-10-CM | POA: Diagnosis not present

## 2020-06-11 ENCOUNTER — Emergency Department (HOSPITAL_COMMUNITY): Payer: Medicare HMO

## 2020-06-11 ENCOUNTER — Encounter (HOSPITAL_COMMUNITY): Payer: Self-pay | Admitting: Emergency Medicine

## 2020-06-11 ENCOUNTER — Emergency Department (HOSPITAL_COMMUNITY)
Admission: EM | Admit: 2020-06-11 | Discharge: 2020-06-12 | Disposition: A | Discharge status: Home / Self Care | Payer: Medicare HMO | Attending: Emergency Medicine | Admitting: Emergency Medicine

## 2020-06-11 DIAGNOSIS — Z7984 Long term (current) use of oral hypoglycemic drugs: Secondary | ICD-10-CM | POA: Insufficient documentation

## 2020-06-11 DIAGNOSIS — F17210 Nicotine dependence, cigarettes, uncomplicated: Secondary | ICD-10-CM | POA: Insufficient documentation

## 2020-06-11 DIAGNOSIS — I10 Essential (primary) hypertension: Secondary | ICD-10-CM | POA: Diagnosis not present

## 2020-06-11 DIAGNOSIS — Z79899 Other long term (current) drug therapy: Secondary | ICD-10-CM | POA: Insufficient documentation

## 2020-06-11 DIAGNOSIS — E119 Type 2 diabetes mellitus without complications: Secondary | ICD-10-CM | POA: Diagnosis not present

## 2020-06-11 DIAGNOSIS — R609 Edema, unspecified: Secondary | ICD-10-CM

## 2020-06-11 DIAGNOSIS — M79672 Pain in left foot: Secondary | ICD-10-CM | POA: Insufficient documentation

## 2020-06-11 DIAGNOSIS — M7989 Other specified soft tissue disorders: Secondary | ICD-10-CM | POA: Insufficient documentation

## 2020-06-11 NOTE — ED Triage Notes (Signed)
Pt c/o L foot swelling and pain, onset today, pt denies injury, unsure re gout hx

## 2020-06-11 NOTE — ED Provider Notes (Signed)
[facility] EMERGENCY DEPARTMENT Provider Note   CSN: [id number] Arrival date & time: 06/11/20  2000     History Chief Complaint  Patient presents with  . Foot Pain    Bradley [name] is a 69 y.o. male.  Patient is a 69 year old male with a history of bipolar disorder, diabetes, hypertension and hyperlipidemia who presents with left foot pain and swelling.  He said it started today.  He denies any known injury.  He has no history of known gout or other joint issues.  No fevers.  No shortness of breath.  He has not take anything for the pain.        Past Medical History:  Diagnosis Date  . Arthritis   . Bipolar 2 disorder ([place]) 2005   Also carries diagnosis of schizophrenia.  . Colon adenomas 2009, 2015  . Diabetes mellitus without complication ([place])   . Hyperlipemia   . Hypertension   . Hypogonadism male    Erectile dysfunction  . Lower GI bleed 07/2015  . 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      Patient Active Problem List   Diagnosis Date Noted  . Malignant neoplasm of prostate ([place]) 04/01/2020  . Involuntary commitment   . Lower GI bleed 07/26/2015  . Acute lower GI bleeding 07/26/2015  . Hematochezia 07/26/2015  . Bipolar I disorder ([place])   . HTN (hypertension), benign   . Diverticulitis of large intestine with abscess without bleeding   . Essential hypertension   . Colonic diverticular abscess   . Diverticulitis 10/13/2014  . Abscess 10/13/2014  . Erectile dysfunction 01/22/2014  . Tobacco abuse 01/22/2014  . Thrombocytopenia, unspecified ([place]) 06/17/2013  . Hyperlipemia   . Bipolar 2 disorder [facility])     Past Surgical History:  Procedure Laterality Date  . COLONOSCOPY  2009, 2015  . ENTEROSCOPY N/A 07/28/2015   Procedure: ENTEROSCOPY;  Surgeon: Ladene Artist, MD;  Location: [facility] ENDOSCOPY;  Service: Endoscopy;  Laterality: N/A;       Family History  Problem Relation Age of Onset    . Cancer Brother        abdominal CA  . Colon cancer Neg Hx   . Esophageal cancer Neg Hx   . Rectal cancer Neg Hx   . Stomach cancer Neg Hx     Social History   Tobacco Use  . Smoking status: Current Every Day Smoker    Packs/day: 1.00    Years: 0.00    Pack years: 0.00    Types: Cigarettes  . Smokeless tobacco: Never Used  Vaping Use  . Vaping Use: Never used  Substance Use Topics  . Alcohol use: No  . Drug use: No    Home Medications Prior to Admission medications   Medication Sig Start Date End Date Taking? Authorizing Provider  acetaminophen (TYLENOL) 500 MG tablet Take 1 tablet (500 mg total) by mouth every 6 (six) hours as needed for mild pain or moderate pain. Patient not taking: Reported on 04/01/2020 03/28/17   Rodell Perna A, PA-C  albuterol (PROVENTIL HFA;VENTOLIN HFA) 108 (90 Base) MCG/ACT inhaler Inhale 1-2 puffs into the lungs every 6 (six) hours as needed for wheezing or shortness of breath. 03/07/17   Rancour, Annie Main, MD  amLODipine (NORVASC) 10 MG tablet Take 10 mg by mouth daily.    [provider]  cephALEXin (KEFLEX) 500 MG capsule Take 1 capsule (500 mg total) by mouth 4 (four) times daily. 06/12/20  Malvin Johns, MD  dorzolamide-timolol (COSOPT) 22.3-6.8 MG/ML ophthalmic solution Place 1 drop into both eyes daily.  11/24/19   [provider]  hydrochlorothiazide (HYDRODIURIL) 25 MG tablet Take 25 mg by mouth daily. 01/10/20   [provider]  lisinopril (PRINIVIL,ZESTRIL) 40 MG tablet Take 40 mg by mouth daily.    [provider]  metFORMIN (GLUCOPHAGE) 1000 MG tablet Take 1,000 mg by mouth daily. 01/10/20   [provider]  pravastatin (PRAVACHOL) 20 MG tablet Take 20 mg by mouth at bedtime. 02/02/20   [provider]  predniSONE (DELTASONE) 20 MG tablet 1 tabs po daily x 4 days 06/12/20   Malvin Johns, MD    Allergies    Patient has no known allergies.  Review of Systems   Review of Systems   Constitutional: Negative for fever.  Gastrointestinal: Negative for nausea and vomiting.  Musculoskeletal: Positive for arthralgias and joint swelling. Negative for back pain and neck pain.  Skin: Negative for wound.  Neurological: Negative for weakness, numbness and headaches.    Physical Exam Updated Vital Signs BP (!) 144/80   Pulse (!) 59   Temp 98.4 F (36.9 C) (Oral)   Resp 16   SpO2 93%   Physical Exam Constitutional:      Appearance: He is well-developed.  HENT:     Head: Normocephalic and atraumatic.  Cardiovascular:     Rate and Rhythm: Normal rate.  Pulmonary:     Effort: Pulmonary effort is normal.  Musculoskeletal:        General: Tenderness present.     Cervical back: Normal range of motion and neck supple.     Comments: Patient has some mild swelling to the dorsum of the left foot.  There is some generalized tenderness to the dorsum of the foot.  There is mild warmth to the area.  No erythema.  He has some mild edema to both lower extremities but it symmetric bilaterally.  He reports this is baseline.  There is no tenderness to the lower leg.  Pedal pulses are intact.  He has normal sensation and motor function distally.  Skin:    General: Skin is warm and dry.  Neurological:     Mental Status: He is alert and oriented to person, place, and time.     ED Results / Procedures / Treatments   Labs (all labs ordered are listed, but only abnormal results are displayed) Labs Reviewed - No data to display  EKG None  Radiology DG Foot Complete Left  Result Date: 06/11/2020 CLINICAL DATA:  Left foot pain and swelling EXAM: LEFT FOOT - COMPLETE 3+ VIEW COMPARISON:  None. FINDINGS: There is no evidence of fracture or dislocation. Fragmented osteophytes are seen at the first MTP joint. There is also dorsal osteophyte at the midfoot and talus. Mild dorsal soft tissue swelling is seen. IMPRESSION: No acute osseous abnormality.  Mild dorsal soft tissue swelling.  Electronically Signed   By: Prudencio Pair M.D.   On: 06/11/2020 23:29    Procedures Procedures (including critical care time)  Medications Ordered in ED Medications  cephALEXin (KEFLEX) capsule 500 mg (has no administration in time range)  predniSONE (DELTASONE) tablet 40 mg (40 mg Oral Given 06/12/20 0019)    ED Course  I have reviewed the triage vital signs and the nursing notes.  Pertinent labs & imaging results that were available during my care of the patient were reviewed by me and considered in my medical decision making (see chart  for details).    MDM Rules/Calculators/A&P                          Patient is a 69 year old male who presents with some swelling to the dorsum of the left foot.  Peers to be localized.  There is no extension up into the leg.  It does not appear to be concerning for DVT.  He has focal tenderness to his foot.  X-rays do not reveal any fracture or other bony abnormality.  He is afebrile.  There is some mild warmth but no erythema.  It could represent an inflammatory process such as an arthritis versus infectious etiology.  I have a lower suspicion of it being cellulitis although I will go ahead and prescribe him a short course of prednisone in addition to Keflex.  There does not seem to be any joint involvement.  He is not systemically ill.  He has no shortness of breath or increased overall edema to his lower extremities.  He was discharged home in good condition.  Return precautions were given. Final Clinical Impression(s) / ED Diagnoses Final diagnoses:  Swelling  Foot pain, left    Rx / DC Orders ED Discharge Orders         Ordered    predniSONE (DELTASONE) 20 MG tablet        06/12/20 0015    cephALEXin (KEFLEX) 500 MG capsule  4 times daily        06/12/20 0015           Malvin Johns, MD 06/12/20 [id number]

## 2020-06-12 MED ORDER — PREDNISONE 20 MG PO TABS
ORAL_TABLET | ORAL | 0 refills | Status: DC
Start: 2020-06-12 — End: 2020-12-16

## 2020-06-12 MED ORDER — PREDNISONE 20 MG PO TABS
40.0000 mg | ORAL_TABLET | Freq: Once | ORAL | Status: AC
  Administered 2020-06-12: 40 mg via ORAL
  Filled 2020-06-12: qty 2

## 2020-06-12 MED ORDER — CEPHALEXIN 250 MG PO CAPS
500.0000 mg | ORAL_CAPSULE | Freq: Once | ORAL | Status: AC
  Administered 2020-06-12: 500 mg via ORAL
  Filled 2020-06-12: qty 2

## 2020-06-12 MED ORDER — CEPHALEXIN 500 MG PO CAPS
500.0000 mg | ORAL_CAPSULE | Freq: Four times a day (QID) | ORAL | 0 refills | Status: DC
Start: 2020-06-12 — End: 2022-08-30

## 2020-06-12 NOTE — Discharge Instructions (Signed)
Take your medications as prescribed.  Keep your foot elevated as much as possible.  Follow-up with your primary care doctor for recheck within the next few days.  Return here as needed if you have any worsening symptoms.

## 2020-06-13 ENCOUNTER — Ambulatory Visit
Admission: RE | Admit: 2020-06-13 | Discharge: 2020-06-13 | Disposition: A | Discharge status: Home / Self Care | Payer: Medicare HMO | Source: Ambulatory Visit | Attending: Radiation Oncology | Admitting: Radiation Oncology

## 2020-06-13 DIAGNOSIS — Z510 Encounter for antineoplastic radiation therapy: Secondary | ICD-10-CM | POA: Diagnosis not present

## 2020-06-14 ENCOUNTER — Ambulatory Visit
Admission: RE | Admit: 2020-06-14 | Discharge: 2020-06-14 | Disposition: A | Discharge status: Home / Self Care | Payer: Medicare HMO | Source: Ambulatory Visit | Attending: Radiation Oncology | Admitting: Radiation Oncology

## 2020-06-14 DIAGNOSIS — Z510 Encounter for antineoplastic radiation therapy: Secondary | ICD-10-CM | POA: Diagnosis not present

## 2020-06-15 ENCOUNTER — Other Ambulatory Visit: Payer: Self-pay

## 2020-06-15 ENCOUNTER — Ambulatory Visit
Admission: RE | Admit: 2020-06-15 | Discharge: 2020-06-15 | Disposition: A | Discharge status: Home / Self Care | Payer: Medicare HMO | Source: Ambulatory Visit | Attending: Radiation Oncology | Admitting: Radiation Oncology

## 2020-06-15 DIAGNOSIS — Z510 Encounter for antineoplastic radiation therapy: Secondary | ICD-10-CM | POA: Diagnosis not present

## 2020-06-16 ENCOUNTER — Other Ambulatory Visit: Payer: Self-pay

## 2020-06-16 ENCOUNTER — Ambulatory Visit
Admission: RE | Admit: 2020-06-16 | Discharge: 2020-06-16 | Disposition: A | Discharge status: Home / Self Care | Payer: Medicare HMO | Source: Ambulatory Visit | Attending: Radiation Oncology | Admitting: Radiation Oncology

## 2020-06-16 DIAGNOSIS — Z510 Encounter for antineoplastic radiation therapy: Secondary | ICD-10-CM | POA: Diagnosis not present

## 2020-06-17 ENCOUNTER — Other Ambulatory Visit: Payer: Self-pay

## 2020-06-17 ENCOUNTER — Ambulatory Visit
Admission: RE | Admit: 2020-06-17 | Discharge: 2020-06-17 | Disposition: A | Discharge status: Home / Self Care | Payer: Medicare HMO | Source: Ambulatory Visit | Attending: Radiation Oncology | Admitting: Radiation Oncology

## 2020-06-17 DIAGNOSIS — Z510 Encounter for antineoplastic radiation therapy: Secondary | ICD-10-CM | POA: Diagnosis not present

## 2020-06-20 ENCOUNTER — Ambulatory Visit
Admission: RE | Admit: 2020-06-20 | Discharge: 2020-06-20 | Disposition: A | Discharge status: Home / Self Care | Payer: Medicare HMO | Source: Ambulatory Visit | Attending: Radiation Oncology | Admitting: Radiation Oncology

## 2020-06-20 ENCOUNTER — Other Ambulatory Visit: Payer: Self-pay

## 2020-06-20 ENCOUNTER — Encounter: Payer: Self-pay | Admitting: Medical Oncology

## 2020-06-20 ENCOUNTER — Encounter: Payer: Self-pay | Admitting: Urology

## 2020-06-20 DIAGNOSIS — Z510 Encounter for antineoplastic radiation therapy: Secondary | ICD-10-CM | POA: Diagnosis present

## 2020-06-20 DIAGNOSIS — C61 Malignant neoplasm of prostate: Secondary | ICD-10-CM | POA: Insufficient documentation

## 2020-07-12 ENCOUNTER — Telehealth: Payer: Self-pay | Admitting: Radiation Oncology

## 2020-07-12 NOTE — Telephone Encounter (Signed)
Received message from [place], [place] that patient called. Phoned patient back to inquire. Patient denies needs at this time. Patient reports he is doing well without complaints. Patient expressed appreciation for checking in.

## 2020-07-25 ENCOUNTER — Other Ambulatory Visit: Payer: Self-pay

## 2020-07-25 ENCOUNTER — Encounter: Payer: Self-pay | Admitting: Urology

## 2020-07-25 ENCOUNTER — Telehealth: Payer: Self-pay

## 2020-07-25 NOTE — Progress Notes (Signed)
Patient has telephone visit with Ashlyn Bruning PA. Patient states nocturia 2 times per night. Patient denies dysuria. Patient states regular bowel movements. Patient states urine stream in moderate and steady. Patient states that he is emptying his bladder completely. Patient states urgency and has a hard time holding his urine. Patient denies hesitancy or straining. Patient states that he is not currently on Flomax. Patient states that he does not have a follow-up appointment with [facility].

## 2020-07-25 NOTE — Telephone Encounter (Signed)
Spoke with patient in regards to telephone visit on 07/27/20 @ 10:00am with Ashlyn Bruning PA. Patient verbalized understanding of appointment date and time. Reviewed meaningful use, AUA and prostate questions. TM

## 2020-07-27 ENCOUNTER — Ambulatory Visit
Admission: RE | Admit: 2020-07-27 | Discharge: 2020-07-27 | Disposition: A | Discharge status: Home / Self Care | Payer: Medicare HMO | Source: Ambulatory Visit | Attending: Urology | Admitting: Urology

## 2020-07-27 ENCOUNTER — Telehealth: Payer: Self-pay | Admitting: Radiation Oncology

## 2020-07-27 ENCOUNTER — Other Ambulatory Visit: Payer: Self-pay

## 2020-07-27 DIAGNOSIS — C61 Malignant neoplasm of prostate: Secondary | ICD-10-CM

## 2020-07-27 NOTE — Progress Notes (Signed)
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  The patient was notified in advance and gave permission to proceed with this visit format.  He tolerated radiation treatment relatively well with only minor urinary irritation and modest fatigue.  He experienced mild dysuria at the end of his stream, and nocturia x2 but denied gross hematuria, excessive daytime frequency, urgency, straining to void, incomplete emptying or incontinence.  He denied any bowel issues.                              On review of systems, the patient states that he is doing well in general.  He reports complete resolution of LUTS and is back to his baseline at this point with nocturia x2 per night.  He specifically denies dysuria, gross hematuria, weak stream, intermittency, hesitancy, excessive daytime frequency, urgency or incontinence.  He reports a healthy appetite and is maintaining his weight.  He has not noticed any significant change in his energy level.  He denies abdominal pain, nausea, vomiting, diarrhea or constipation.  Overall, he is quite pleased with his progress to date.  ALLERGIES:  has No Known Allergies.  Meds: Current Outpatient Medications  Medication Sig Dispense Refill  . albuterol (PROVENTIL HFA;VENTOLIN HFA) 108 (90 Base) MCG/ACT inhaler Inhale  1-2 puffs into the lungs every 6 (six) hours as needed for wheezing or shortness of breath. 1 Inhaler 0  . amLODipine (NORVASC) 10 MG tablet Take 10 mg by mouth daily.    . cephALEXin (KEFLEX) 500 MG capsule Take 1 capsule (500 mg total) by mouth 4 (four) times daily. 28 capsule 0  . dorzolamide-timolol (COSOPT) 22.3-6.8 MG/ML ophthalmic solution Place 1 drop into both eyes daily.     . hydrochlorothiazide (HYDRODIURIL) 25 MG tablet Take 25 mg by mouth daily.    Marland Kitchen lisinopril (PRINIVIL,ZESTRIL) 40 MG tablet Take 40 mg by mouth daily.    . metFORMIN (GLUCOPHAGE) 1000 MG tablet Take 1,000 mg by mouth daily.    . pravastatin (PRAVACHOL) 20 MG tablet Take 20 mg by mouth at bedtime.    . predniSONE (DELTASONE) 20 MG tablet 1 tabs po daily x 4 days 4 tablet 0  . acetaminophen (TYLENOL) 500 MG tablet Take 1 tablet (500 mg total) by mouth every 6 (six) hours as needed for mild pain or moderate pain. (Patient not taking: Reported on 04/01/2020) 30 tablet 0   No current facility-administered medications for this encounter.    Physical Findings:  vitals were not taken for this visit.   /Unable to assess due to telephone follow-up visit format.  Lab Findings: Lab Results  Component Value Date   WBC 5.7 03/28/2020   HGB 16.4 03/28/2020   HCT 51.5 03/28/2020   MCV 88.6 03/28/2020   PLT 121 (  L) 03/28/2020     Radiographic Findings: No results found.  Impression/Plan: 1.  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 He will continue to follow up with urology for ongoing PSA determinations but does not currently have an appointment scheduled with Dr. Louis Meckel at this time.  He anticipates a follow-up visit in February 2022.  He understands what to expect with regards to PSA monitoring going forward. I will look forward to following his response to treatment via correspondence with urology, and would be happy to continue to participate in his  care if clinically indicated. I talked to the patient about what to expect in the future, including his risk for erectile dysfunction and rectal bleeding. I encouraged him to call or return to the office if he has any questions regarding his previous radiation or possible radiation side effects. He was comfortable with this plan and will follow up as needed.    Nicholos Johns, PA-C

## 2020-07-27 NOTE — Progress Notes (Signed)
  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  Image guidance was performed with daily cone beam CT prior to each fraction to align to gold markers in the prostate and assure proper bladder and rectal fill volumes.  Immobilization was achieved with [employer] custom mold.  Narrative: The patient tolerated radiation treatment relatively well with only minor urinary irritation and modest fatigue.  He experienced mild dysuria at the end of his stream, and nocturia x2 but denied gross hematuria, excessive daytime frequency, urgency, straining to void, incomplete emptying or incontinence.  He denied any bowel issues.  Plan: The patient has completed radiation treatment. He will return to radiation oncology clinic for routine followup in one month. I advised him to call or return sooner if he has any questions or concerns related to his recovery or treatment. ________________________________  Sheral Apley. Tammi Klippel, M.D.

## 2020-07-27 NOTE — Telephone Encounter (Signed)
Received voicemail message from patient requesting a return call "so he could tell me something." Phoned patient promptly to inquire but no answer. Left voicemail message with my direct number for callback.

## 2020-08-29 ENCOUNTER — Other Ambulatory Visit: Payer: Self-pay | Admitting: Student in an Organized Health Care Education/Training Program

## 2020-09-30 ENCOUNTER — Other Ambulatory Visit: Payer: Self-pay | Admitting: Student in an Organized Health Care Education/Training Program

## 2020-09-30 DIAGNOSIS — R053 Chronic cough: Secondary | ICD-10-CM

## 2020-09-30 DIAGNOSIS — Z720 Tobacco use: Secondary | ICD-10-CM

## 2020-11-15 ENCOUNTER — Other Ambulatory Visit: Payer: Self-pay

## 2020-11-15 ENCOUNTER — Emergency Department (HOSPITAL_COMMUNITY): Payer: Medicare HMO

## 2020-11-15 ENCOUNTER — Encounter (HOSPITAL_COMMUNITY): Payer: Self-pay | Admitting: Emergency Medicine

## 2020-11-15 ENCOUNTER — Emergency Department (HOSPITAL_COMMUNITY)
Admission: EM | Admit: 2020-11-15 | Discharge: 2020-11-15 | Disposition: A | Discharge status: Home / Self Care | Payer: Medicare HMO | Attending: Emergency Medicine | Admitting: Emergency Medicine

## 2020-11-15 DIAGNOSIS — M25511 Pain in right shoulder: Secondary | ICD-10-CM | POA: Diagnosis present

## 2020-11-15 DIAGNOSIS — I10 Essential (primary) hypertension: Secondary | ICD-10-CM | POA: Insufficient documentation

## 2020-11-15 DIAGNOSIS — F17210 Nicotine dependence, cigarettes, uncomplicated: Secondary | ICD-10-CM | POA: Insufficient documentation

## 2020-11-15 DIAGNOSIS — Z79899 Other long term (current) drug therapy: Secondary | ICD-10-CM | POA: Insufficient documentation

## 2020-11-15 DIAGNOSIS — Z7984 Long term (current) use of oral hypoglycemic drugs: Secondary | ICD-10-CM | POA: Insufficient documentation

## 2020-11-15 DIAGNOSIS — M19011 Primary osteoarthritis, right shoulder: Secondary | ICD-10-CM | POA: Insufficient documentation

## 2020-11-15 DIAGNOSIS — Z8546 Personal history of malignant neoplasm of prostate: Secondary | ICD-10-CM | POA: Insufficient documentation

## 2020-11-15 DIAGNOSIS — E119 Type 2 diabetes mellitus without complications: Secondary | ICD-10-CM | POA: Diagnosis not present

## 2020-11-15 DIAGNOSIS — M19019 Primary osteoarthritis, unspecified shoulder: Secondary | ICD-10-CM

## 2020-11-15 MED ORDER — NAPROXEN 250 MG PO TABS
500.0000 mg | ORAL_TABLET | Freq: Once | ORAL | Status: AC
  Administered 2020-11-15: 500 mg via ORAL
  Filled 2020-11-15: qty 2

## 2020-11-15 MED ORDER — DICLOFENAC SODIUM 1 % EX GEL: 2.0000 g | Freq: Four times a day (QID) | CUTANEOUS | 0 refills | Status: DC

## 2020-11-15 NOTE — ED Triage Notes (Signed)
Patient reports right shoulder joint pain onset yesterday , denies injury or strenuous activity/heavy lifting , pain increases with movement /changing positions .

## 2020-11-15 NOTE — Discharge Instructions (Addendum)
Apply Diclofenac to your shoulder as prescribed for pain. Gentle shoulder exercises as discussed. Follow up with your doctor or see orthopedics.

## 2020-11-15 NOTE — ED Provider Notes (Signed)
[facility] EMERGENCY DEPARTMENT Provider Note   CSN: [id number] Arrival date & time: 11/15/20  0530     History Chief Complaint  Patient presents with  . Shoulder Pain    Bradley [name] is a 70 y.o. male.  70 year old male with complaint of right shoulder pain yesterday, sudden onset without injury, has not taken anything for pain. Pain occurs with any movement. Patient is right hand dominant. Denies weakness, numbness in the arm. Denies chest pain or shortness of breath.         Past Medical History:  Diagnosis Date  . Arthritis   . Bipolar 2 disorder ([place]) 2005   Also carries diagnosis of schizophrenia.  . Colon adenomas 2009, 2015  . Diabetes mellitus without complication ([place])   . Hyperlipemia   . Hypertension   . Hypogonadism male    Erectile dysfunction  . Lower GI bleed 07/2015  . 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      Patient Active Problem List   Diagnosis Date Noted  . Malignant neoplasm of prostate ([place]) 04/01/2020  . Involuntary commitment   . Lower GI bleed 07/26/2015  . Acute lower GI bleeding 07/26/2015  . Hematochezia 07/26/2015  . Bipolar I disorder ([place])   . HTN (hypertension), benign   . Diverticulitis of large intestine with abscess without bleeding   . Essential hypertension   . Colonic diverticular abscess   . Diverticulitis 10/13/2014  . Abscess 10/13/2014  . Erectile dysfunction 01/22/2014  . Tobacco abuse 01/22/2014  . Thrombocytopenia, unspecified ([place]) 06/17/2013  . Hyperlipemia   . Bipolar 2 disorder [facility])     Past Surgical History:  Procedure Laterality Date  . COLONOSCOPY  2009, 2015  . ENTEROSCOPY N/A 07/28/2015   Procedure: ENTEROSCOPY;  Surgeon: Ladene Artist, MD;  Location: [facility] ENDOSCOPY;  Service: Endoscopy;  Laterality: N/A;       Family History  Problem Relation Age of Onset  . Cancer Brother        abdominal CA  . Colon cancer Neg Hx   .  Esophageal cancer Neg Hx   . Rectal cancer Neg Hx   . Stomach cancer Neg Hx     Social History   Tobacco Use  . Smoking status: Current Every Day Smoker    Packs/day: 1.00    Years: 0.00    Pack years: 0.00    Types: Cigarettes  . Smokeless tobacco: Never Used  Vaping Use  . Vaping Use: Never used  Substance Use Topics  . Alcohol use: No  . Drug use: No    Home Medications Prior to Admission medications   Medication Sig Start Date End Date Taking? Authorizing Provider  diclofenac Sodium (VOLTAREN) 1 % GEL Apply 2 g topically 4 (four) times daily. 11/15/20  Yes Tacy Learn, PA-C  acetaminophen (TYLENOL) 500 MG tablet Take 1 tablet (500 mg total) by mouth every 6 (six) hours as needed for mild pain or moderate pain. Patient not taking: Reported on 04/01/2020 03/28/17   Rodell Perna A, PA-C  albuterol (PROVENTIL HFA;VENTOLIN HFA) 108 (90 Base) MCG/ACT inhaler Inhale 1-2 puffs into the lungs every 6 (six) hours as needed for wheezing or shortness of breath. 03/07/17   Rancour, Annie Main, MD  amLODipine (NORVASC) 10 MG tablet Take 10 mg by mouth daily.    [provider]  cephALEXin (KEFLEX) 500 MG capsule Take 1 capsule (500 mg total) by mouth 4 (four) times  daily. 06/12/20   Malvin Johns, MD  dorzolamide-timolol (COSOPT) 22.3-6.8 MG/ML ophthalmic solution Place 1 drop into both eyes daily.  11/24/19   [provider]  hydrochlorothiazide (HYDRODIURIL) 25 MG tablet Take 25 mg by mouth daily. 01/10/20   [provider]  lisinopril (PRINIVIL,ZESTRIL) 40 MG tablet Take 40 mg by mouth daily.    [provider]  metFORMIN (GLUCOPHAGE) 1000 MG tablet Take 1,000 mg by mouth daily. 01/10/20   [provider]  pravastatin (PRAVACHOL) 20 MG tablet Take 20 mg by mouth at bedtime. 02/02/20   [provider]  predniSONE (DELTASONE) 20 MG tablet 1 tabs po daily x 4 days 06/12/20   Malvin Johns, MD    Allergies    Patient has no known  allergies.  Review of Systems   Review of Systems  Constitutional: Negative for fever.  Musculoskeletal: Positive for arthralgias and myalgias. Negative for neck pain and neck stiffness.  Skin: Negative for color change, rash and wound.  Allergic/Immunologic: Positive for immunocompromised state.  Neurological: Negative for weakness and numbness.  Psychiatric/Behavioral: Negative for confusion.    Physical Exam Updated Vital Signs BP (!) 154/76 (BP Location: Left Arm)   Pulse 79   Temp 98.7 F (37.1 C) (Oral)   Resp 16   Ht 5\' 8"  (1.727 m)   Wt 115 kg   SpO2 93%   BMI 38.55 kg/m   Physical Exam Vitals and nursing note reviewed.  Eyes:     Conjunctiva/sclera: Conjunctivae normal.  Cardiovascular:     Rate and Rhythm: Normal rate and regular rhythm.     Pulses: Normal pulses.     Heart sounds: Normal heart sounds.  Pulmonary:     Effort: Pulmonary effort is normal.  Musculoskeletal:        General: Tenderness present. No swelling, deformity or signs of injury.     Right shoulder: Tenderness present. No swelling, deformity, effusion or crepitus. Decreased range of motion. Normal strength. Normal pulse.     Left shoulder: Normal.  Skin:    General: Skin is warm and dry.     Findings: No erythema or rash.  Neurological:     Mental Status: He is alert and oriented to person, place, and time.  Psychiatric:        Behavior: Behavior normal.     ED Results / Procedures / Treatments   Labs (all labs ordered are listed, but only abnormal results are displayed) Labs Reviewed - No data to display  EKG None  Radiology DG Shoulder Right  Result Date: 11/15/2020 CLINICAL DATA:  Right shoulder pain EXAM: RIGHT SHOULDER - 2+ VIEW COMPARISON:  None. FINDINGS: Normal alignment. No fracture or dislocation. Mild glenohumeral degenerative arthritis with subtle osteophyte formation noted. The acromioclavicular joint space is not well profiled. There is focal calcification adjacent  to the lesser tuberosity possibly related to calcific tendinitis involving the subscapularis. Limited evaluation of the right hemithorax is unremarkable. IMPRESSION: Mild glenohumeral degenerative arthritis. No fracture or dislocation. Electronically Signed   By: Fidela Salisbury MD   On: 11/15/2020 06:03    Procedures Procedures   Medications Ordered in ED Medications  naproxen (NAPROSYN) tablet 500 mg (has no administration in time range)    ED Course  I have reviewed the triage vital signs and the nursing notes.  Pertinent labs & imaging results that were available during my care of the patient were reviewed by me and considered in my medical decision making (see chart for details).  Clinical Course as of 11/15/20 0840  Tue Nov 15, 2057  [id number] 70 year old male with complaint of right shoulder pain onset yesterday without injury.  Denies shortness of breath and chest pain.  On exam, strong radial pulse, sensation and strength intact.  Pain reproduced with any range of motion, is tender to MVC.  X-ray shows glenohumeral degenerative arthritis. Patient is given naproxen in the ED, discharged with prescription for topical diclofenac and referred to orthopedics for follow-up.  Encouraged gentle range of motion exercises. [LM]    Clinical Course User Index [LM] Roque Lias   MDM Rules/Calculators/A&P                          Final Clinical Impression(s) / ED Diagnoses Final diagnoses:  Acute pain of right shoulder  Shoulder arthritis    Rx / DC Orders ED Discharge Orders         Ordered    diclofenac Sodium (VOLTAREN) 1 % GEL  4 times daily        11/15/20 0834           Tacy Learn, PA-C 11/15/20 [id number]    Sherwood Gambler, MD 11/16/20 1515

## 2020-11-17 ENCOUNTER — Institutional Professional Consult (permissible substitution): Payer: Medicare HMO | Admitting: Internal Medicine

## 2020-11-17 NOTE — Progress Notes (Deleted)
   Bradley [name], male    DOB: 08/26/1950     MRN: [id number]   Brief patient profile:  51 yowbm      History of Present Illness  11/17/2020  Pulmonary/ 1st office eval/Bradley [name]  No chief complaint on file.    Dyspnea:  *** Cough: *** Sleep: *** SABA use:   Past Medical History:  Diagnosis Date  . Arthritis   . Bipolar 2 disorder ([place]) 2005   Also carries diagnosis of schizophrenia.  . Colon adenomas 2009, 2015  . Diabetes mellitus without complication ([place])   . Hyperlipemia   . Hypertension   . Hypogonadism male    Erectile dysfunction  . Lower GI bleed 07/2015  . 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      Outpatient Medications Prior to Visit  Medication Sig Dispense Refill  . acetaminophen (TYLENOL) 500 MG tablet Take 1 tablet (500 mg total) by mouth every 6 (six) hours as needed for mild pain or moderate pain. (Patient not taking: Reported on 04/01/2020) 30 tablet 0  . albuterol (PROVENTIL HFA;VENTOLIN HFA) 108 (90 Base) MCG/ACT inhaler Inhale 1-2 puffs into the lungs every 6 (six) hours as needed for wheezing or shortness of breath. 1 Inhaler 0  . amLODipine (NORVASC) 10 MG tablet Take 10 mg by mouth daily.    . cephALEXin (KEFLEX) 500 MG capsule Take 1 capsule (500 mg total) by mouth 4 (four) times daily. 28 capsule 0  . diclofenac Sodium (VOLTAREN) 1 % GEL Apply 2 g topically 4 (four) times daily. 100 g 0  . dorzolamide-timolol (COSOPT) 22.3-6.8 MG/ML ophthalmic solution Place 1 drop into both eyes daily.     . hydrochlorothiazide (HYDRODIURIL) 25 MG tablet Take 25 mg by mouth daily.    Marland Kitchen lisinopril (PRINIVIL,ZESTRIL) 40 MG tablet Take 40 mg by mouth daily.    . metFORMIN (GLUCOPHAGE) 1000 MG tablet Take 1,000 mg by mouth daily.    . pravastatin (PRAVACHOL) 20 MG tablet Take 20 mg by mouth at bedtime.    . predniSONE (DELTASONE) 20 MG tablet 1 tabs po daily x 4 days 4 tablet 0   No facility-administered medications prior  to visit.     Objective:     There were no vitals taken for this visit.         Assessment   No problem-specific Assessment & Plan notes found for this encounter.     Christinia Gully, MD 11/17/2020

## 2020-12-01 ENCOUNTER — Other Ambulatory Visit: Payer: Self-pay | Admitting: Student in an Organized Health Care Education/Training Program

## 2020-12-01 ENCOUNTER — Other Ambulatory Visit: Payer: Self-pay | Admitting: Family Medicine

## 2020-12-01 DIAGNOSIS — R053 Chronic cough: Secondary | ICD-10-CM

## 2020-12-05 ENCOUNTER — Other Ambulatory Visit: Payer: Self-pay | Admitting: Student in an Organized Health Care Education/Training Program

## 2020-12-05 DIAGNOSIS — Z720 Tobacco use: Secondary | ICD-10-CM

## 2020-12-16 ENCOUNTER — Ambulatory Visit (INDEPENDENT_AMBULATORY_CARE_PROVIDER_SITE_OTHER): Payer: Medicare HMO | Admitting: Pulmonary Disease

## 2020-12-16 ENCOUNTER — Encounter: Payer: Self-pay | Admitting: Pulmonary Disease

## 2020-12-16 ENCOUNTER — Ambulatory Visit (INDEPENDENT_AMBULATORY_CARE_PROVIDER_SITE_OTHER): Payer: Medicare HMO

## 2020-12-16 ENCOUNTER — Other Ambulatory Visit: Payer: Self-pay

## 2020-12-16 VITALS — BP 140/70 | HR 82 | Temp 97.2°F | Ht 68.0 in | Wt 256.2 lb

## 2020-12-16 DIAGNOSIS — R0602 Shortness of breath: Secondary | ICD-10-CM

## 2020-12-16 DIAGNOSIS — Z720 Tobacco use: Secondary | ICD-10-CM | POA: Diagnosis not present

## 2020-12-16 LAB — CBC WITH DIFFERENTIAL/PLATELET
Basophils Absolute: 0 10*3/uL (ref 0.0–0.1)
Basophils Relative: 0.4 % (ref 0.0–3.0)
Eosinophils Absolute: 0.1 10*3/uL (ref 0.0–0.7)
Eosinophils Relative: 1.2 % (ref 0.0–5.0)
HCT: 46.7 % (ref 39.0–52.0)
Hemoglobin: 15.3 g/dL (ref 13.0–17.0)
Lymphocytes Relative: 25.7 % (ref 12.0–46.0)
Lymphs Abs: 1.2 10*3/uL (ref 0.7–4.0)
MCHC: 32.7 g/dL (ref 30.0–36.0)
MCV: 88.7 fl (ref 78.0–100.0)
Monocytes Absolute: 0.9 10*3/uL (ref 0.1–1.0)
Monocytes Relative: 18.8 % — ABNORMAL HIGH (ref 3.0–12.0)
Neutro Abs: 2.5 10*3/uL (ref 1.4–7.7)
Neutrophils Relative %: 53.9 % (ref 43.0–77.0)
Platelets: 100 10*3/uL — ABNORMAL LOW (ref 150.0–400.0)
RBC: 5.26 Mil/uL (ref 4.22–5.81)
RDW: 16.2 % — ABNORMAL HIGH (ref 11.5–15.5)
WBC: 4.6 10*3/uL (ref 4.0–10.5)

## 2020-12-16 MED ORDER — ANORO ELLIPTA 62.5-25 MCG/INH IN AEPB: 1.0000 | INHALATION_SPRAY | Freq: Every day | RESPIRATORY_TRACT | 2 refills | Status: DC

## 2020-12-16 MED ORDER — NICOTINE 21 MG/24HR TD PT24: 21.0000 mg | MEDICATED_PATCH | Freq: Every day | TRANSDERMAL | 0 refills | Status: DC

## 2020-12-16 MED ORDER — PREDNISONE 20 MG PO TABS: ORAL_TABLET | ORAL | 0 refills | Status: DC

## 2020-12-16 NOTE — Progress Notes (Signed)
Bradley [name]    [id number]    17-Aug-1951  Primary Care Physician:[facility], [facility]  Referring Physician: Carmine Savoy, NP [facility] [address] [place],  [place] [address]  Chief complaint: Consult for hypoxia  HPI: Active smoker with history of hypertension, diabetes, hyperlipidemia Noted to have sats below 88% in the primary care office and was sent here for further evaluation States that breathing is doing well.  Denies any cough, sputum production, wheezing, fevers, chills  Pets: No pets Occupation: Retired Chief Financial Officer Exposures: No exposure to mold, hot tub, Jacuzzi.  No asbestos exposure.  No pillows or comforters Smoking history: 30-pack-year smoker.  Continues to smoke half pack per day Travel history: Originally from [place].  Immigrated in 1975.  No significant recent travel Relevant family history: No family history of lung disease   Outpatient Encounter Medications as of 12/16/2020  Medication Sig  . albuterol (PROVENTIL HFA;VENTOLIN HFA) 108 (90 Base) MCG/ACT inhaler Inhale 1-2 puffs into the lungs every 6 (six) hours as needed for wheezing or shortness of breath.  Marland Kitchen amLODipine (NORVASC) 10 MG tablet Take 10 mg by mouth daily.  . diclofenac Sodium (VOLTAREN) 1 % GEL Apply 2 g topically 4 (four) times daily.  . dorzolamide-timolol (COSOPT) 22.3-6.8 MG/ML ophthalmic solution Place 1 drop into both eyes daily.   . hydrochlorothiazide (HYDRODIURIL) 25 MG tablet Take 25 mg by mouth daily.  Marland Kitchen lisinopril (PRINIVIL,ZESTRIL) 40 MG tablet Take 40 mg by mouth daily.  . metFORMIN (GLUCOPHAGE) 1000 MG tablet Take 1,000 mg by mouth daily.  . pravastatin (PRAVACHOL) 20 MG tablet Take 20 mg by mouth at bedtime.  Marland Kitchen acetaminophen (TYLENOL) 500 MG tablet Take 1 tablet (500 mg total) by mouth every 6 (six) hours as needed for mild pain or moderate pain. (Patient not taking: Reported on 04/01/2020)  . cephALEXin (KEFLEX) 500 MG  capsule Take 1 capsule (500 mg total) by mouth 4 (four) times daily.  . [DISCONTINUED] predniSONE (DELTASONE) 20 MG tablet 1 tabs po daily x 4 days   No facility-administered encounter medications on file as of 12/16/2020.    Allergies as of 12/16/2020  . (No Known Allergies)    Past Medical History:  Diagnosis Date  . Arthritis   . Bipolar 2 disorder ([place]) 2005   Also carries diagnosis of schizophrenia.  . Colon adenomas 2009, 2015  . Diabetes mellitus without complication ([place])   . Hyperlipemia   . Hypertension   . Hypogonadism male    Erectile dysfunction  . Lower GI bleed 07/2015  . 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      Past Surgical History:  Procedure Laterality Date  . COLONOSCOPY  2009, 2015  . ENTEROSCOPY N/A 07/28/2015   Procedure: ENTEROSCOPY;  Surgeon: Ladene Artist, MD;  Location: [facility] ENDOSCOPY;  Service: Endoscopy;  Laterality: N/A;    Family History  Problem Relation Age of Onset  . Cancer Brother        abdominal CA  . Colon cancer Neg Hx   . Esophageal cancer Neg Hx   . Rectal cancer Neg Hx   . Stomach cancer Neg Hx     Social History   Socioeconomic History  . Marital status: Divorced    Spouse name: Not on file  . Number of children: Not on file  . Years of education: Not on file  . Highest education level: Not on file  Occupational History  . Not on file  Tobacco Use  . Smoking status: Current Every Day Smoker    Packs/day: 0.50    Years: 0.00    Pack years: 0.00    Types: Cigarettes  . Smokeless tobacco: Never Used  Vaping Use  . Vaping Use: Never used  Substance and Sexual Activity  . Alcohol use: No  . Drug use: No  . Sexual activity: Not on file  Other Topics Concern  . Not on file  Social History Narrative   Patient originally from [place].   Social Determinants of Health   Financial Resource Strain: Not on file  Food Insecurity: Not on file  Transportation Needs: Not on  file  Physical Activity: Not on file  Stress: Not on file  Social Connections: Not on file  Intimate Partner Violence: Not on file    Review of systems: Review of Systems  Constitutional: Negative for fever and chills.  HENT: Negative.   Eyes: Negative for blurred vision.  Respiratory: as per HPI  Cardiovascular: Negative for chest pain and palpitations.  Gastrointestinal: Negative for vomiting, diarrhea, blood per rectum. Genitourinary: Negative for dysuria, urgency, frequency and hematuria.  Musculoskeletal: Negative for myalgias, back pain and joint pain.  Skin: Negative for itching and rash.  Neurological: Negative for dizziness, tremors, focal weakness, seizures and loss of consciousness.  Endo/Heme/Allergies: Negative for environmental allergies.  Psychiatric/Behavioral: Negative for depression, suicidal ideas and hallucinations.  All other systems reviewed and are negative.  Physical Exam: Blood pressure 140/70, pulse 82, temperature (!) 97.2 F (36.2 C), temperature source Temporal, height 5\' 8"  (1.727 m), weight 256 lb 3.2 oz (116.2 kg), SpO2 92 %. Gen:      No acute distress HEENT:  EOMI, sclera anicteric Neck:     No masses; no thyromegaly Lungs:    Bilateral expiratory wheeze, crackle CV:         Regular rate and rhythm; no murmurs Abd:      + bowel sounds; soft, non-tender; no palpable masses, no distension Ext:    No edema; adequate peripheral perfusion Skin:      Warm and dry; no rash Neuro: alert and oriented x 3 Psych: normal mood and affect  Data Reviewed: Imaging: CT abdomen pelvis 12/27/2016-visualized lung bases show atelectasis at the base Chest x-ray 05/23/2018- bandlike atelectasis at the lung base  I have reviewed the images personally.  PFTs:   Labs: CBC 04/22/2017-WBC 7.4, eos 0%  Assessment:  Consult for hypoxia Likely has COPD given smoking history and presentation with wheezing He did not desat today on exertion with low O2 sats of  89%  Is in mild exacerbation today Check CBC, IgE, chest x-ray Treat with prednisone for 5 days, start Anoro inhaler.  No need for inhaled corticosteroids unless peripheral eosinophils are high  Schedule PFTs for baseline assessment  Active smoker Discussed smoking cessation.  He is ready to quit Prescribe nicotine patches.  Reassess at return visit.  Time spent counseling-5 minutes  Refer for low-dose screening CT of the chest  Plan/Recommendations: Prednisone 40 mg for 5 days, anoro CBC, IgE, alpha-1 antitrypsin PFTs and chest x-ray Referral for low-dose screening CT chest  Marshell Garfinkel MD [facility] 12/16/2020, 10:29 AM  CC: Carmine Savoy, NP

## 2020-12-16 NOTE — Addendum Note (Signed)
Addended by: Elton Sin on: 12/16/2020 10:53 AM   Modules accepted: Orders

## 2020-12-16 NOTE — Patient Instructions (Addendum)
We will give you prednisone 40 mg a day for 5 days Start Anoro inhaler  Schedule PFTs Check CBC differential, IgE, alpha-1 antitrypsin levels and phenotype Chest x-ray today Send referral for low-dose screening CT chest  Follow-up in 3 months

## 2020-12-19 ENCOUNTER — Other Ambulatory Visit: Payer: Self-pay

## 2020-12-19 ENCOUNTER — Ambulatory Visit
Admission: RE | Admit: 2020-12-19 | Discharge: 2020-12-19 | Disposition: A | Discharge status: Home / Self Care | Payer: Medicare HMO | Source: Ambulatory Visit | Attending: Student in an Organized Health Care Education/Training Program | Admitting: Student in an Organized Health Care Education/Training Program

## 2020-12-19 DIAGNOSIS — Z720 Tobacco use: Secondary | ICD-10-CM

## 2020-12-31 LAB — ALPHA-1 ANTITRYPSIN PHENOTYPE: A-1 Antitrypsin, Ser: 133 mg/dL (ref 83–199)

## 2020-12-31 LAB — IGE: IgE (Immunoglobulin E), Serum: 27 kU/L (ref <=114)

## 2021-02-17 ENCOUNTER — Telehealth: Payer: Self-pay | Admitting: *Deleted

## 2021-02-17 NOTE — Telephone Encounter (Signed)
RETURNED PATIENT'S PHONE CALL, LVM FOR A RETURN CALL 

## 2021-03-11 ENCOUNTER — Other Ambulatory Visit: Payer: Self-pay | Admitting: Family Medicine

## 2021-03-11 DIAGNOSIS — N289 Disorder of kidney and ureter, unspecified: Secondary | ICD-10-CM

## 2021-03-28 ENCOUNTER — Ambulatory Visit
Admission: RE | Admit: 2021-03-28 | Discharge: 2021-03-28 | Disposition: A | Discharge status: Home / Self Care | Payer: Medicare HMO | Source: Ambulatory Visit | Attending: Family Medicine | Admitting: Family Medicine

## 2021-03-28 ENCOUNTER — Other Ambulatory Visit: Payer: Self-pay

## 2021-03-28 DIAGNOSIS — N289 Disorder of kidney and ureter, unspecified: Secondary | ICD-10-CM

## 2021-03-28 MED ORDER — GADOBENATE DIMEGLUMINE 529 MG/ML IV SOLN
20.0000 mL | Freq: Once | INTRAVENOUS | Status: AC | PRN
  Administered 2021-03-28: 20 mL via INTRAVENOUS

## 2021-04-25 ENCOUNTER — Ambulatory Visit (INDEPENDENT_AMBULATORY_CARE_PROVIDER_SITE_OTHER): Payer: Medicare HMO | Admitting: Pulmonary Disease

## 2021-04-25 ENCOUNTER — Other Ambulatory Visit: Payer: Self-pay

## 2021-04-25 ENCOUNTER — Encounter: Payer: Self-pay | Admitting: Pulmonary Disease

## 2021-04-25 VITALS — BP 142/84 | HR 61 | Temp 98.2°F | Resp 95 | Ht 68.0 in | Wt 256.2 lb

## 2021-04-25 DIAGNOSIS — R0602 Shortness of breath: Secondary | ICD-10-CM

## 2021-04-25 DIAGNOSIS — J439 Emphysema, unspecified: Secondary | ICD-10-CM

## 2021-04-25 DIAGNOSIS — J849 Interstitial pulmonary disease, unspecified: Secondary | ICD-10-CM | POA: Diagnosis not present

## 2021-04-25 LAB — PULMONARY FUNCTION TEST
DL/VA % pred: 149 %
DL/VA: 6.2 ml/min/mmHg/L
DLCO cor % pred: 54 %
DLCO cor: 13.51 ml/min/mmHg
DLCO unc % pred: 54 %
DLCO unc: 13.51 ml/min/mmHg
FEF 25-75 Post: 1.57 L/sec
FEF 25-75 Pre: 1.4 L/sec
FEF2575-%Change-Post: 12 %
FEF2575-%Pred-Post: 64 %
FEF2575-%Pred-Pre: 57 %
FEV1-%Change-Post: 6 %
FEV1-%Pred-Post: 44 %
FEV1-%Pred-Pre: 42 %
FEV1-Post: 1.23 L
FEV1-Pre: 1.15 L
FEV1FVC-%Change-Post: -1 %
FEV1FVC-%Pred-Pre: 110 %
FEV6-%Change-Post: 8 %
FEV6-%Pred-Post: 42 %
FEV6-%Pred-Pre: 39 %
FEV6-Post: 1.47 L
FEV6-Pre: 1.35 L
FEV6FVC-%Pred-Post: 104 %
FEV6FVC-%Pred-Pre: 104 %
FVC-%Change-Post: 8 %
FVC-%Pred-Post: 40 %
FVC-%Pred-Pre: 37 %
FVC-Post: 1.47 L
FVC-Pre: 1.35 L
Post FEV1/FVC ratio: 84 %
Post FEV6/FVC ratio: 100 %
Pre FEV1/FVC ratio: 85 %
Pre FEV6/FVC Ratio: 100 %
RV % pred: 155 %
RV: 3.55 L
TLC % pred: 73 %
TLC: 4.86 L

## 2021-04-25 NOTE — Progress Notes (Signed)
PFT done today. 

## 2021-04-25 NOTE — Progress Notes (Signed)
Bradley [name]    [id number]    November 22, 1950  Primary Care Physician:[facility], [facility]  Referring Physician: [facility], [facility] [place],  [place] [address]  Chief complaint: Follow-up for hypoxia, dyspnea  HPI: Active smoker with history of hypertension, diabetes, hyperlipidemia Noted to have sats below 88% in the primary care office and was sent here for further evaluation States that breathing is doing well.  Denies any cough, sputum production, wheezing, fevers, chills  Pets: No pets Occupation: Retired Chief Financial Officer Exposures: No exposure to mold, hot tub, Jacuzzi.  No asbestos exposure.  No pillows or comforters Smoking history: 30-pack-year smoker.  Continues to smoke half pack per day Travel history: Originally from [place].  Immigrated in 1975.  No significant recent travel Relevant family history: No family history of lung disease  Interim history: At last visit he was given prednisone for wheezing and started on anoro inhaler States that breathing is improved and he is doing well with the new inhaler.     Outpatient Encounter Medications as of 04/25/2021  Medication Sig   acetaminophen (TYLENOL) 500 MG tablet Take 1 tablet (500 mg total) by mouth every 6 (six) hours as needed for mild pain or moderate pain.   albuterol (PROVENTIL HFA;VENTOLIN HFA) 108 (90 Base) MCG/ACT inhaler Inhale 1-2 puffs into the lungs every 6 (six) hours as needed for wheezing or shortness of breath.   amLODipine (NORVASC) 10 MG tablet Take 10 mg by mouth daily.   cephALEXin (KEFLEX) 500 MG capsule Take 1 capsule (500 mg total) by mouth 4 (four) times daily.   diclofenac Sodium (VOLTAREN) 1 % GEL Apply 2 g topically 4 (four) times daily.   dorzolamide-timolol (COSOPT) 22.3-6.8 MG/ML ophthalmic solution Place 1 drop into both eyes daily.    hydrochlorothiazide (HYDRODIURIL) 25 MG tablet Take 25 mg by mouth daily.   lisinopril  (PRINIVIL,ZESTRIL) 40 MG tablet Take 40 mg by mouth daily.   metFORMIN (GLUCOPHAGE) 1000 MG tablet Take 1,000 mg by mouth daily.   nicotine (NICODERM CQ - DOSED IN MG/24 HOURS) 21 mg/24hr patch Place 1 patch (21 mg total) onto the skin daily.   pravastatin (PRAVACHOL) 20 MG tablet Take 20 mg by mouth at bedtime.   predniSONE (DELTASONE) 20 MG tablet Take '40mg'$  daily for 5 days   umeclidinium-vilanterol (ANORO ELLIPTA) 62.5-25 MCG/INH AEPB Inhale 1 puff into the lungs daily.   No facility-administered encounter medications on file as of 04/25/2021.    Physical Exam: Blood pressure 140/70, pulse 82, temperature (!) 97.2 F (36.2 C), temperature source Temporal, height '5\' 8"'$  (1.727 m), weight 256 lb 3.2 oz (116.2 kg), SpO2 92 %. Gen:      No acute distress HEENT:  EOMI, sclera anicteric Neck:     No masses; no thyromegaly Lungs:    Bilateral expiratory wheeze, crackle CV:         Regular rate and rhythm; no murmurs Abd:      + bowel sounds; soft, non-tender; no palpable masses, no distension Ext:    No edema; adequate peripheral perfusion Skin:      Warm and dry; no rash Neuro: alert and oriented x 3 Psych: normal mood and affect  Data Reviewed: Imaging: CT abdomen pelvis 12/27/2016-visualized lung bases show atelectasis at the base Chest x-ray 05/23/2018- bandlike atelectasis at the lung base Screening CT chest 12/19/2020-mild emphysema, mild subpleural reticulation of the bilateral lower lobes. I have reviewed the images personally.  PFTs: 04/25/2021 FVC 1.47 [40%], FEV1 1.23 [44%], F/F 84, TLC 4.86 [72%], DLCO 13.51 [54%] Minimal restriction with severe diffusion impairment  Labs: CBC 04/22/2017-WBC 7.4, eos 0% CBC 12/16/2020-WBC 4.6, eos 1.2%, absolute eosinophil count 55  IgE 12/17/2018 2-27 Alpha-1 antitrypsin 12/16/2020-133, PI MM  Assessment:  Emphysema No obstruction on PFTs.  But he did have wheezing which improved with steroids and inhaler.  We will continue anoro for  now  PFTs reviewed with minimal restriction and severe diffusion impairment.  I suspect diffusion impairment is due to ongoing smoking.  His CT does not show significant ILD though there may be mild reticulation at the base. We will get high-res CT for further evaluation   Active smoker Discussed smoking cessation.  He is ready to quit Prescribe nicotine patches.  Reassess at return visit.  Time spent counseling-5 minutes  Refer for smoking cessation clinic  Plan/Recommendations: Continue anoro Referral for smoking cessation clinic  Marshell Garfinkel MD [facility] 04/25/2021, 11:27 AM  CC: [facility]

## 2021-04-25 NOTE — Patient Instructions (Signed)
I am glad you are doing well with regard to your breathing Continue to work on smoking cessation Continue the inhaler Follow-up in 1 year

## 2021-04-26 NOTE — Addendum Note (Signed)
Addended by: Elton Sin on: 04/26/2021 02:00 PM   Modules accepted: Orders

## 2021-05-12 ENCOUNTER — Ambulatory Visit
Admission: RE | Admit: 2021-05-12 | Discharge: 2021-05-12 | Disposition: A | Discharge status: Home / Self Care | Payer: Medicare HMO | Source: Ambulatory Visit | Attending: Pulmonary Disease | Admitting: Pulmonary Disease

## 2021-05-12 ENCOUNTER — Other Ambulatory Visit: Payer: Self-pay

## 2021-05-12 ENCOUNTER — Other Ambulatory Visit: Payer: Medicare HMO

## 2021-05-12 DIAGNOSIS — J849 Interstitial pulmonary disease, unspecified: Secondary | ICD-10-CM

## 2021-06-05 ENCOUNTER — Telehealth: Payer: Self-pay | Admitting: Pulmonary Disease

## 2021-06-05 DIAGNOSIS — J849 Interstitial pulmonary disease, unspecified: Secondary | ICD-10-CM

## 2021-06-05 IMAGING — DX DG FOOT COMPLETE 3+V*L*
3 series · 3 of 3 positions shown · non-contrast
Comparison: None.

CLINICAL DATA: Left foot pain and swelling

EXAM:
LEFT FOOT - COMPLETE 3+ VIEW

[foot ap]
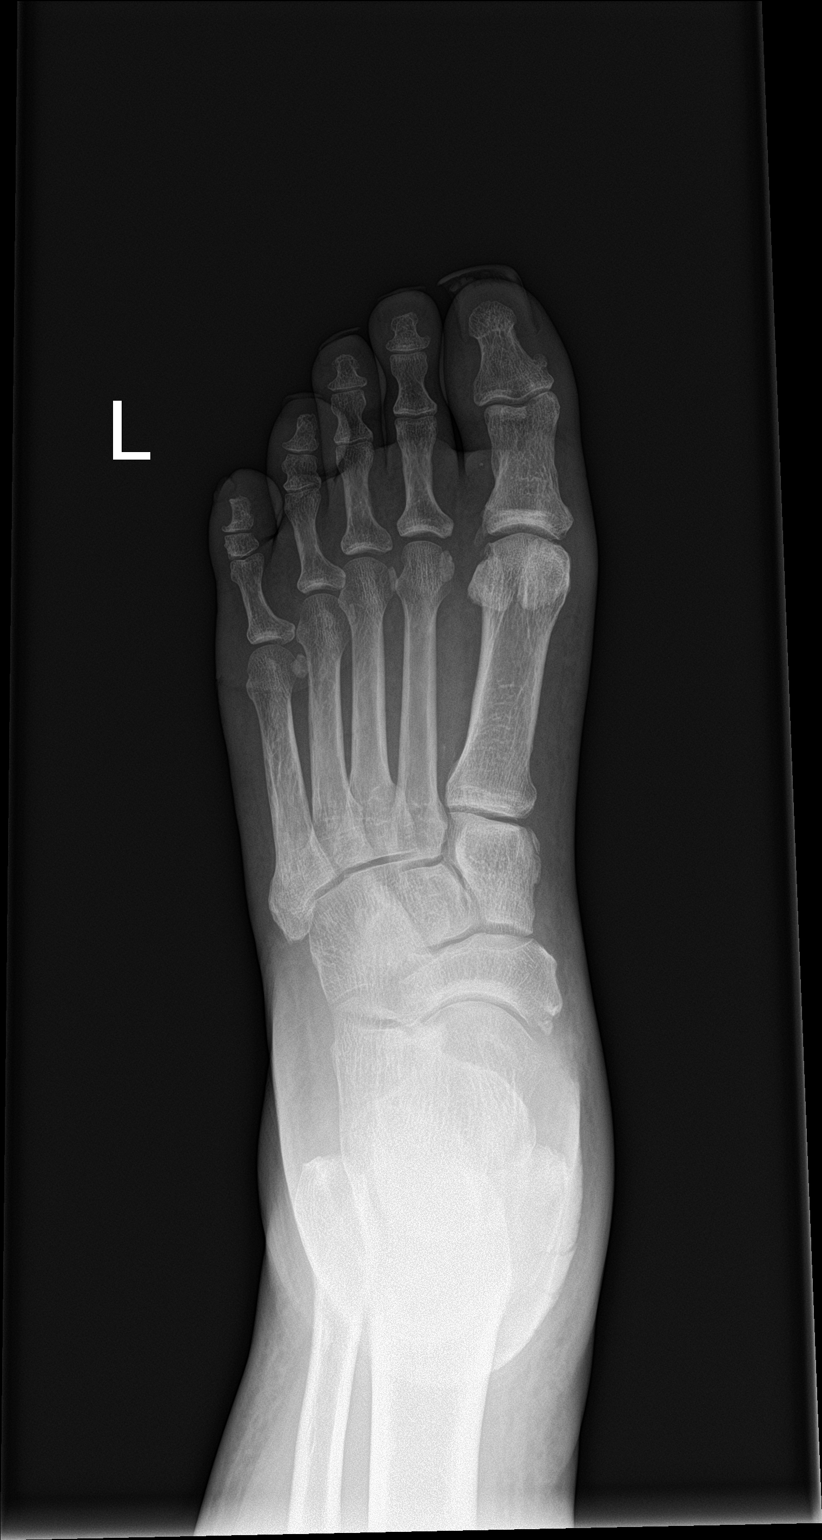

[foot obl]
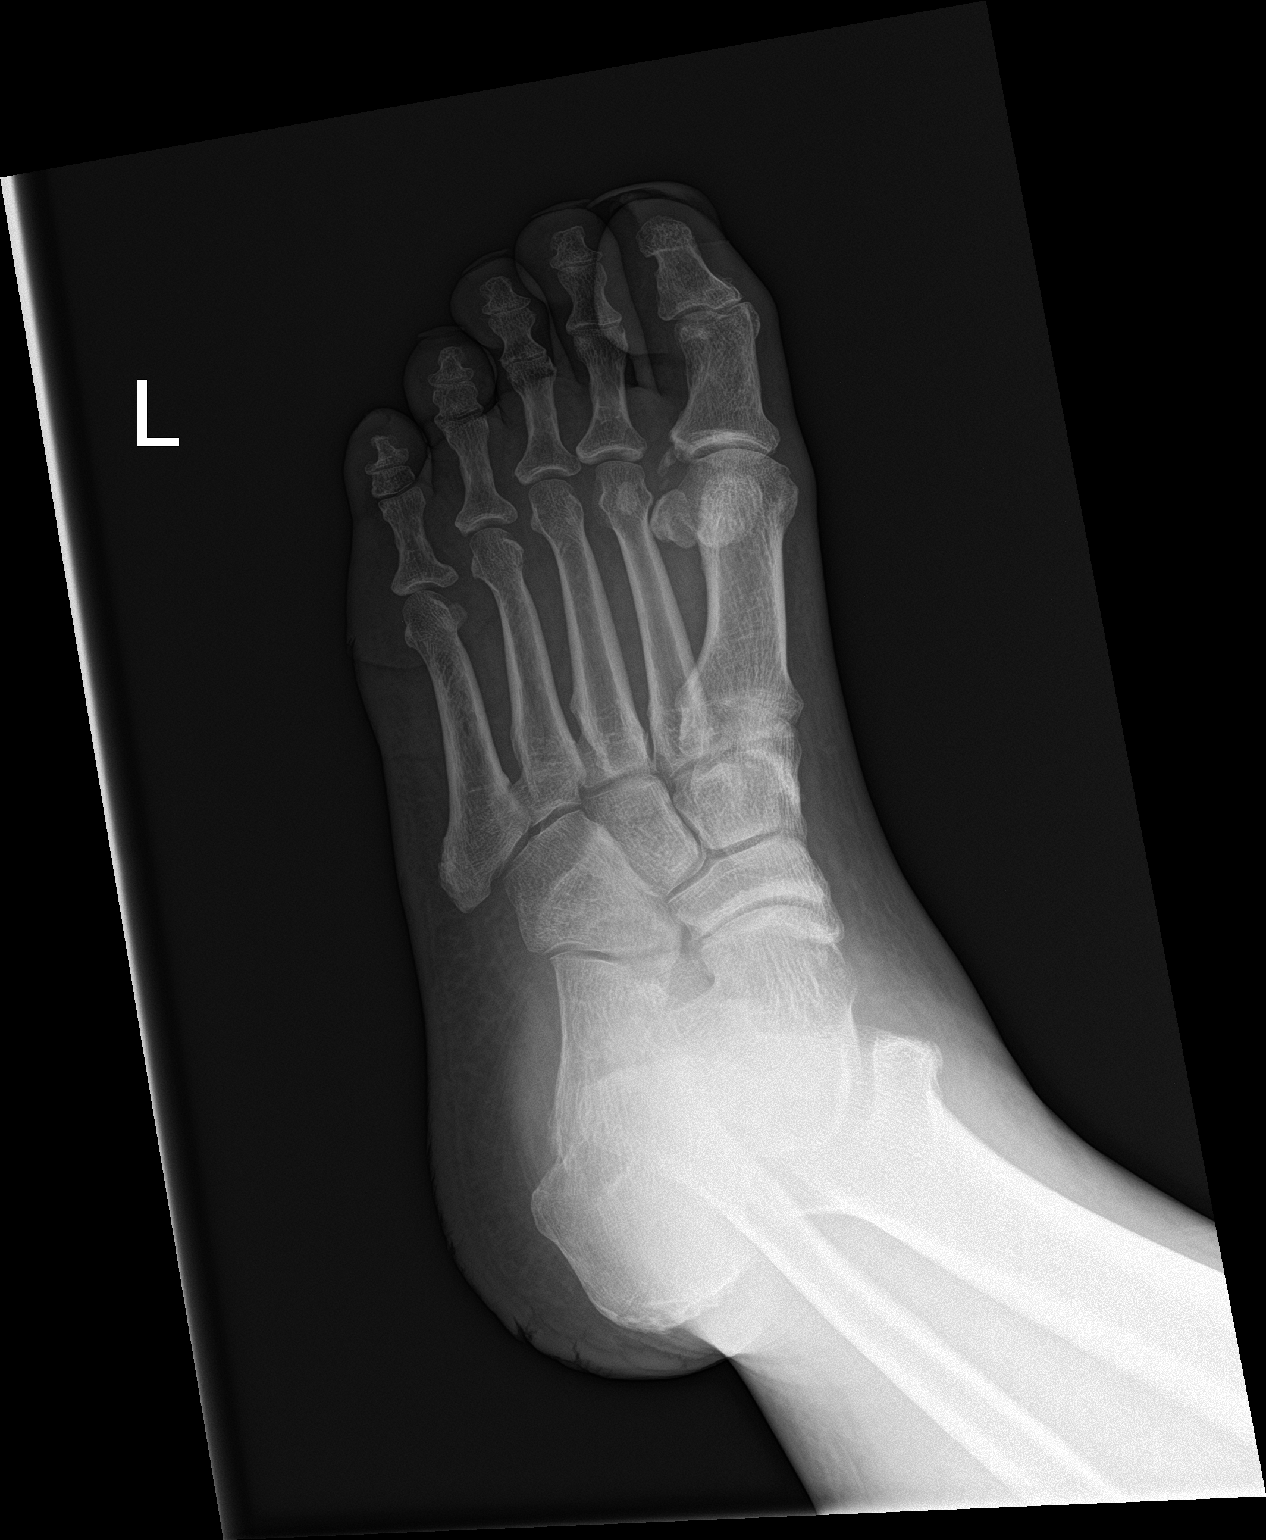

[foot lat]
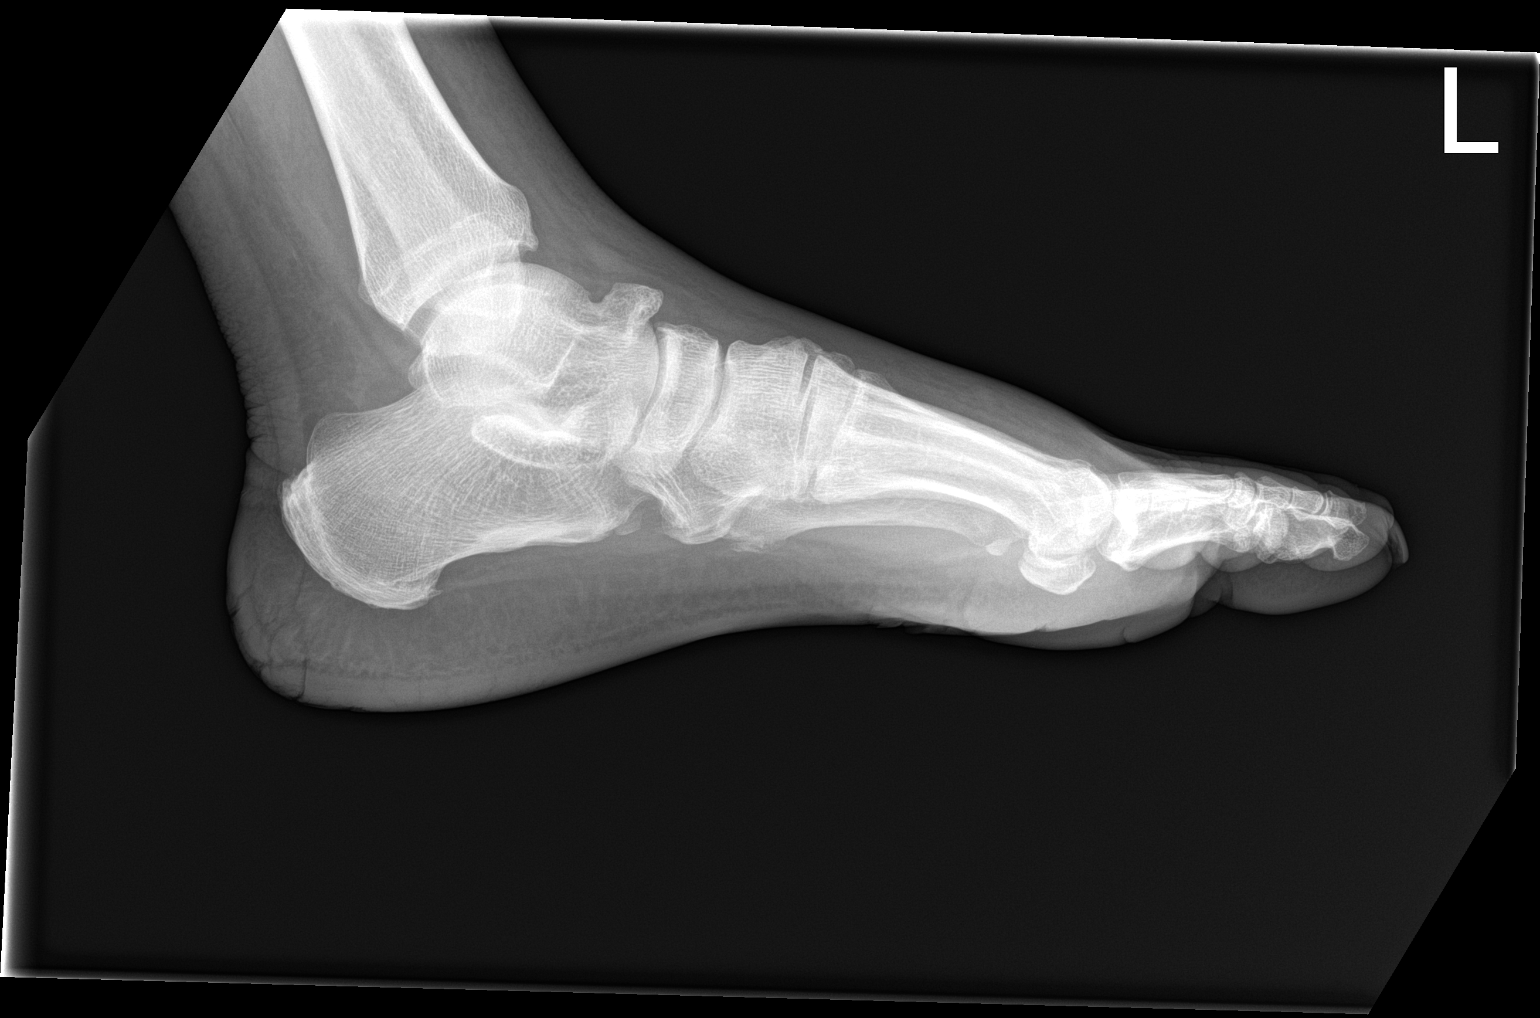

[3 of 3 positions shown; findings below may reference images not displayed]

FINDINGS: There is no evidence of fracture or dislocation. Fragmented
osteophytes are seen at the first MTP joint. There is also dorsal
osteophyte at the midfoot and talus. Mild dorsal soft tissue
swelling is seen.
IMPRESSION: No acute osseous abnormality.  Mild dorsal soft tissue swelling.

## 2021-06-05 NOTE — Telephone Encounter (Signed)
Marshell Garfinkel, MD  05/31/2021 11:20 AM EDT     CT looks ok with no evidence of interstitial lung disease. There are findings of COPD.  Continue management as discussed in clinic visit   Refer to low dose screening CT to start in Sept 2022   I spoke with the pt and notified of results per Dr Vaughan Browner, Pt verbalized understanding. He was agreeable to repeat ct- low dose in Sept. Dr Vaughan Browner- please clarify the date on next ct. It's Sept 2023?

## 2021-06-06 NOTE — Telephone Encounter (Signed)
Low dose Chest CT ordered for Sept. 2023. Nothing further needed at this time.

## 2021-06-06 NOTE — Telephone Encounter (Signed)
Yes. Sorry. Meant to say low dose screening CT to start in sept 2023

## 2021-12-04 ENCOUNTER — Other Ambulatory Visit: Payer: Self-pay | Admitting: Family Medicine

## 2021-12-04 DIAGNOSIS — Z122 Encounter for screening for malignant neoplasm of respiratory organs: Secondary | ICD-10-CM

## 2021-12-20 ENCOUNTER — Ambulatory Visit: Payer: Medicare HMO | Admitting: Podiatry

## 2021-12-20 ENCOUNTER — Encounter: Payer: Self-pay | Admitting: Podiatry

## 2021-12-20 DIAGNOSIS — E119 Type 2 diabetes mellitus without complications: Secondary | ICD-10-CM

## 2021-12-20 DIAGNOSIS — M79675 Pain in left toe(s): Secondary | ICD-10-CM | POA: Diagnosis not present

## 2021-12-20 DIAGNOSIS — B351 Tinea unguium: Secondary | ICD-10-CM

## 2021-12-20 DIAGNOSIS — M79674 Pain in right toe(s): Secondary | ICD-10-CM | POA: Diagnosis not present

## 2021-12-20 DIAGNOSIS — L84 Corns and callosities: Secondary | ICD-10-CM

## 2021-12-20 NOTE — Progress Notes (Signed)
?  Subjective:  ?Patient ID: Bradley [name], male    DOB: 10-03-1950,   MRN: [id number] ? ?Chief Complaint  ?Patient presents with  ? Nail Problem  ?  Onychomycosis  ? ? ?71 y.o. male presents for concern concern of thickened elongated and painful nails that are difficult to trim. Requesting to have them trimmed today. Also concerned with thick crack and dry heels.  Denies any burning or tingling in her feet. Patient is diabetic and last A1c was 6.3 5 years ago. No more recent numbers in chart.  ?.  . Denies any other pedal complaints. Denies n/v/f/c.  ? ?PCP: Vanita Panda MD  ? ?Past Medical History:  ?Diagnosis Date  ? Arthritis   ? Bipolar 2 disorder ([place]) 2005  ? Also carries diagnosis of schizophrenia.  ? Colon adenomas 2009, 2015  ? Diabetes mellitus without complication ([place])   ? Hyperlipemia   ? Hypertension   ? Hypogonadism male   ? Erectile dysfunction  ? Lower GI bleed 07/2015  ? 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 Dr.Gorsuch attributed it to Depakote.    ? ? ?Objective:  ?Physical Exam: ?Vascular: DP/PT pulses 2/4 bilateral. CFT <3 seconds. Absent hair growth on digits. Edema noted to bilateral lower extremities. Xerosis noted bilaterally.  ?Skin. No lacerations or abrasions bilateral feet. Nails 1-5 bilateral  are thickened discolored and elongated with subungual debris. Heels with hyperkeratosis and small fissures noted. No open lesions ?Musculoskeletal: MMT 5/5 bilateral lower extremities in DF, PF, Inversion and Eversion. Deceased ROM in DF of ankle joint.  ?Neurological: Sensation intact to light touch. Protective sensation diminished bilateral.  ? ? ?Assessment:  ? ?1. Type 2 diabetes mellitus without complication, unspecified whether long term insulin use ([place])   ?2. Pain due to onychomycosis of toenails of both feet   ?3. Callus of foot   ? ? ? ?Plan:  ?Patient was evaluated and treated and all questions answered. ?-Discussed and educated patient on diabetic foot care, especially  with  ?regards to the vascular, neurological and musculoskeletal systems.  ?-Stressed the importance of good glycemic control and the detriment of not  ?controlling glucose levels in relation to the foot. ?-Discussed supportive shoes at all times and checking feet regularly.  ?-Mechanically debrided all nails 1-5 bilateral using sterile nail nipper and filed with dremel without incident  ?-Recommend O'keefes for the dry cracked heels.  ?-Answered all patient questions ?-Patient to return  in 3 months for at risk foot care ?-Patient advised to call the office if any problems or questions arise in the meantime. ? ? ?Lorenda Peck, DPM  ? ? ?

## 2021-12-29 ENCOUNTER — Other Ambulatory Visit: Payer: Medicare HMO

## 2022-01-08 DIAGNOSIS — F250 Schizoaffective disorder, bipolar type: Secondary | ICD-10-CM | POA: Diagnosis present

## 2022-01-17 ENCOUNTER — Other Ambulatory Visit: Payer: Medicare HMO

## 2022-02-14 ENCOUNTER — Ambulatory Visit
Admission: RE | Admit: 2022-02-14 | Discharge: 2022-02-14 | Disposition: A | Discharge status: Home / Self Care | Payer: Medicare HMO | Source: Ambulatory Visit | Attending: Family Medicine | Admitting: Family Medicine

## 2022-02-14 DIAGNOSIS — Z122 Encounter for screening for malignant neoplasm of respiratory organs: Secondary | ICD-10-CM

## 2022-03-28 ENCOUNTER — Ambulatory Visit: Payer: Medicare HMO | Admitting: Podiatry

## 2022-08-30 ENCOUNTER — Encounter (HOSPITAL_COMMUNITY): Payer: Self-pay

## 2022-08-30 ENCOUNTER — Emergency Department (HOSPITAL_COMMUNITY)
Admission: EM | Admit: 2022-08-30 | Discharge: 2022-08-30 | Disposition: A | Discharge status: Home / Self Care | Payer: Medicare HMO | Attending: Emergency Medicine | Admitting: Emergency Medicine

## 2022-08-30 ENCOUNTER — Emergency Department (HOSPITAL_COMMUNITY): Payer: Medicare HMO

## 2022-08-30 ENCOUNTER — Other Ambulatory Visit: Payer: Self-pay

## 2022-08-30 DIAGNOSIS — K5900 Constipation, unspecified: Secondary | ICD-10-CM | POA: Insufficient documentation

## 2022-08-30 DIAGNOSIS — L03211 Cellulitis of face: Secondary | ICD-10-CM | POA: Insufficient documentation

## 2022-08-30 DIAGNOSIS — R220 Localized swelling, mass and lump, head: Secondary | ICD-10-CM | POA: Diagnosis present

## 2022-08-30 LAB — CBC WITH DIFFERENTIAL/PLATELET
Abs Immature Granulocytes: 0.01 10*3/uL (ref 0.00–0.07)
Basophils Absolute: 0 10*3/uL (ref 0.0–0.1)
Basophils Relative: 0 %
Eosinophils Absolute: 0.1 10*3/uL (ref 0.0–0.5)
Eosinophils Relative: 1 %
HCT: 44.7 % (ref 39.0–52.0)
Hemoglobin: 14.1 g/dL (ref 13.0–17.0)
Immature Granulocytes: 0 %
Lymphocytes Relative: 34 %
Lymphs Abs: 1.7 10*3/uL (ref 0.7–4.0)
MCH: 29.2 pg (ref 26.0–34.0)
MCHC: 31.5 g/dL (ref 30.0–36.0)
MCV: 92.5 fL (ref 80.0–100.0)
Monocytes Absolute: 1 10*3/uL (ref 0.1–1.0)
Monocytes Relative: 20 %
Neutro Abs: 2.2 10*3/uL (ref 1.7–7.7)
Neutrophils Relative %: 45 %
Platelets: 109 10*3/uL — ABNORMAL LOW (ref 150–400)
RBC: 4.83 MIL/uL (ref 4.22–5.81)
RDW: 14.7 % (ref 11.5–15.5)
WBC: 4.9 10*3/uL (ref 4.0–10.5)
nRBC: 0 % (ref 0.0–0.2)

## 2022-08-30 LAB — BASIC METABOLIC PANEL
Anion gap: 9 (ref 5–15)
BUN: 10 mg/dL (ref 8–23)
CO2: 25 mmol/L (ref 22–32)
Calcium: 8.7 mg/dL — ABNORMAL LOW (ref 8.9–10.3)
Chloride: 103 mmol/L (ref 98–111)
Creatinine, Ser: 1.19 mg/dL (ref 0.61–1.24)
GFR, Estimated: >60 mL/min (ref >60)
Glucose, Bld: 94 mg/dL (ref 70–99)
Potassium: 4.1 mmol/L (ref 3.5–5.1)
Sodium: 137 mmol/L (ref 135–145)

## 2022-08-30 MED ORDER — DOCUSATE SODIUM 100 MG PO CAPS: 100.0000 mg | ORAL_CAPSULE | Freq: Two times a day (BID) | ORAL | 0 refills | Status: DC

## 2022-08-30 MED ORDER — GOLYTELY 236 G PO SOLR: 4.0000 L | Freq: Once | ORAL | 0 refills | Status: DC

## 2022-08-30 MED ORDER — IOHEXOL 350 MG/ML SOLN
75.0000 mL | Freq: Once | INTRAVENOUS | Status: AC | PRN
  Administered 2022-08-30: 75 mL via INTRAVENOUS

## 2022-08-30 MED ORDER — GOLYTELY 236 G PO SOLR: 4.0000 L | Freq: Once | ORAL | 0 refills | Status: AC

## 2022-08-30 MED ORDER — AMOXICILLIN-POT CLAVULANATE 875-125 MG PO TABS: 1.0000 | ORAL_TABLET | Freq: Two times a day (BID) | ORAL | 0 refills | Status: AC

## 2022-08-30 NOTE — ED Provider Triage Note (Signed)
Emergency Medicine Provider Triage Evaluation Note  Bradley [name] , a 72 y.o. male  was evaluated in triage.  Pt complains of left-sided jaw pain and constipation.  Patient states that he has had no bowel movements for 7 days.  He states that he has tried 3 doses of laxatives at home over the past week with no relief.  He also complains of left-sided jaw pain.  He denies fevers, nausea, vomiting  Review of Systems  Positive: As above Negative: As above  Physical Exam  There were no vitals taken for this visit. Gen:   Awake, no distress   Resp:  Normal effort  MSK:   Moves extremities without difficulty  Other:  Minimal swelling noted with palpation of left jaw near TMJ; poor dentition noted  Medical Decision Making  Medically screening exam initiated at 11:17 AM.  Appropriate orders placed.  Bradley [name] was informed that the remainder of the evaluation will be completed by another provider, this initial triage assessment does not replace that evaluation, and the importance of remaining in the ED until their evaluation is complete.     Dorothyann Peng, PA-C 08/30/22 1123

## 2022-08-30 NOTE — ED Triage Notes (Signed)
Pt c/o constipationx7d. Pt states he is passing gas. Pt tried dulcolax OTC , but it didn't work. Pt c/o left sided facial pain. Pt denies injury or teeth issues

## 2022-08-30 NOTE — Discharge Instructions (Addendum)
If you develop new or worsening facial swelling/pain, fever, vomiting, abdominal pain, blood in the stool, or any other new/concerning symptoms and return to the ER or call 911.  Your blood pressure is very elevated today and you will need to follow-up with your primary care physician for further outpatient management of this.  Make sure you are taking your blood pressure medicines each day as instructed.

## 2022-08-30 NOTE — ED Provider Notes (Signed)
[facility] EMERGENCY DEPARTMENT Provider Note   CSN: [id number] Arrival date & time: 08/30/22  1113     History  Chief Complaint  Patient presents with   Fecal Impaction   Facial Pain    Bradley [name] is a 72 y.o. male.  HPI 72 year old male presents with a chief complaint of constipation.  Has not had a bowel movement in about 7 days.  He has passed some gas.  He denies any vomiting, abdominal pain.  He tried GlycoLax 2 times without relief.  He is also complaining of left sided facial swelling and discomfort as well as some dental pain.  He feels like those symptoms are currently improving.  No fevers.  Home Medications Prior to Admission medications   Medication Sig Start Date End Date Taking? Authorizing Provider  amoxicillin-clavulanate (AUGMENTIN) 875-125 MG tablet Take 1 tablet by mouth every 12 (twelve) hours for 5 days. 08/30/22 09/04/22 Yes Sherwood Gambler, MD  docusate sodium (COLACE) 100 MG capsule Take 1 capsule (100 mg total) by mouth every 12 (twelve) hours. 08/30/22  Yes Sherwood Gambler, MD  acetaminophen (TYLENOL) 500 MG tablet Take 1 tablet (500 mg total) by mouth every 6 (six) hours as needed for mild pain or moderate pain. 03/28/17   Fawze, Mina A, PA-C  albuterol (PROVENTIL HFA;VENTOLIN HFA) 108 (90 Base) MCG/ACT inhaler Inhale 1-2 puffs into the lungs every 6 (six) hours as needed for wheezing or shortness of breath. 03/07/17   Rancour, Annie Main, MD  amLODipine (NORVASC) 10 MG tablet Take 10 mg by mouth daily.    [provider]  diclofenac Sodium (VOLTAREN) 1 % GEL Apply 2 g topically 4 (four) times daily. 11/15/20   Tacy Learn, PA-C  dorzolamide-timolol (COSOPT) 22.3-6.8 MG/ML ophthalmic solution Place 1 drop into both eyes daily.  11/24/19   [provider]  hydrochlorothiazide (HYDRODIURIL) 25 MG tablet Take 25 mg by mouth daily. 01/10/20   [provider]  lisinopril (PRINIVIL,ZESTRIL) 40 MG tablet Take 40 mg by  mouth daily.    [provider]  metFORMIN (GLUCOPHAGE) 1000 MG tablet Take 1,000 mg by mouth daily. 01/10/20   [provider]  nicotine (NICODERM CQ - DOSED IN MG/24 HOURS) 21 mg/24hr patch Place 1 patch (21 mg total) onto the skin daily. 12/16/20   Mannam, Hart Robinsons, MD  polyethylene glycol (GOLYTELY) 236 g solution Take 4,000 mLs by mouth once for 1 dose. 08/30/22 08/30/22  Sherwood Gambler, MD  pravastatin (PRAVACHOL) 20 MG tablet Take 20 mg by mouth at bedtime. 02/02/20   [provider]  predniSONE (DELTASONE) 20 MG tablet Take '40mg'$  daily for 5 days 12/16/20   Mannam, Hart Robinsons, MD  umeclidinium-vilanterol (ANORO ELLIPTA) 62.5-25 MCG/INH AEPB Inhale 1 puff into the lungs daily. 12/16/20   Marshell Garfinkel, MD      Allergies    Patient has no known allergies.    Review of Systems   Review of Systems  Constitutional:  Negative for fever.  HENT:  Positive for dental problem and facial swelling.   Gastrointestinal:  Positive for constipation. Negative for abdominal pain and vomiting.    Physical Exam Updated Vital Signs BP (!) 184/100 (BP Location: Right Arm)   Pulse 70   Temp 98.2 F (36.8 C)   Resp 20   SpO2 93%  Physical Exam Vitals and nursing note reviewed. Exam conducted with a chaperone present.  Constitutional:      Appearance: He is well-developed.  HENT:     Head: Normocephalic  and atraumatic.      Mouth/Throat:     Comments: Diffuse dental disease.  No obvious dental abscess. Cardiovascular:     Rate and Rhythm: Normal rate and regular rhythm.     Heart sounds: Normal heart sounds.  Pulmonary:     Effort: Pulmonary effort is normal.  Abdominal:     Palpations: Abdomen is soft.     Tenderness: There is no abdominal tenderness.  Genitourinary:    Comments: No gross blood or significant stool on rectal exam.  No fecal impaction. Skin:    General: Skin is warm and dry.  Neurological:     Mental Status: He is alert.     ED Results /  Procedures / Treatments   Labs (all labs ordered are listed, but only abnormal results are displayed) Labs Reviewed  BASIC METABOLIC PANEL - Abnormal; Notable for the following components:      Result Value   Calcium 8.7 (*)    All other components within normal limits  CBC WITH DIFFERENTIAL/PLATELET - Abnormal; Notable for the following components:   Platelets 109 (*)    All other components within normal limits    EKG None  Radiology CT Maxillofacial W Contrast  Result Date: 08/30/2022 CLINICAL DATA:  Left cheek swelling EXAM: CT MAXILLOFACIAL WITH CONTRAST TECHNIQUE: Multidetector CT imaging of the maxillofacial structures was performed with intravenous contrast. Multiplanar CT image reconstructions were also generated. RADIATION DOSE REDUCTION: This exam was performed according to the departmental dose-optimization program which includes automated exposure control, adjustment of the mA and/or kV according to patient size and/or use of iterative reconstruction technique. CONTRAST:  17m OMNIPAQUE IOHEXOL 350 MG/ML SOLN COMPARISON:  None Available. FINDINGS: Osseous: No fracture or mandibular dislocation. No destructive process. Orbits: Negative. No traumatic or inflammatory finding. Sinuses: Clear. Soft tissues: There is mild asymmetric soft tissue stranding in the soft tissues along the left jaw (series 3, image 41), as well as asymmetric thickening of theleft platysma. No discrete fluid collection is visualized. 6 mm hypodense lesion in the left palatine tonsil is favored to represent a retention cyst, but direct visualization is recommended. Limited intracranial: No significant or unexpected finding. IMPRESSION: 1. Mild asymmetric soft tissue stranding in the soft tissues along the left jaw, as well as asymmetric thickening of the left platysma. Findings are suggestive of cellulitis without abscess. 2. 6 mm hypodense lesion in the left palatine tonsil is favored to represent a retention  cyst, but direct visualization is recommended. Electronically Signed   By: HMarin RobertsM.D.   On: 08/30/2022 13:41   DG Abdomen 1 View  Result Date: 08/30/2022 CLINICAL DATA:  Constipation. EXAM: ABDOMEN - 1 VIEW COMPARISON:  None Available. FINDINGS: Mild/moderate stool burden. No findings for small bowel obstruction or free air. The soft tissue shadows are grossly maintained. No worrisome calcifications. Bibasilar atelectasis is noted. Suspect small left pleural effusion. The bony structures are intact. IMPRESSION: 1. Mild/moderate stool burden. 2. Bibasilar atelectasis and probable small left pleural effusion. Electronically Signed   By: PMarijo SanesM.D.   On: 08/30/2022 12:09    Procedures Procedures    Medications Ordered in ED Medications  iohexol (OMNIPAQUE) 350 MG/ML injection 75 mL (75 mLs Intravenous Contrast Given 08/30/22 1322)    ED Course/ Medical Decision Making/ A&P                           Medical Decision Making Amount and/or Complexity of Data Reviewed  Labs:     Details: Normal WBC.  Unremarkable renal function. Radiology: independent interpretation performed.    Details: Constipation without bowel obstruction on x-ray.  Risk OTC drugs. Prescription drug management.   Patient presents with constipation.  He is not found to have a fecal impaction or significant distal stool burden.  Discussed options, patient would like to use the same medicine that he was given for a colonoscopy and so he will be given a GoLytely prescription.  Otherwise he has a benign abdominal exam.  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  He does appear to have some chronic dental disease and so we will give Augmentin to cover for most likely pathogens.  He is found to have elevated blood pressure and he thinks he might not have taken his blood pressure meds last night.  He was encouraged to take these as directed and  follow-up with his PCP for recheck and possible medication adjustment.        Final Clinical Impression(s) / ED Diagnoses Final diagnoses:  Facial cellulitis  Constipation, unspecified constipation type    Rx / DC Orders ED Discharge Orders          Ordered    polyethylene glycol (GOLYTELY) 236 g solution   Once,   Status:  Discontinued        08/30/22 1434    amoxicillin-clavulanate (AUGMENTIN) 875-125 MG tablet  Every 12 hours        08/30/22 1434    docusate sodium (COLACE) 100 MG capsule  Every 12 hours        08/30/22 1434    polyethylene glycol (GOLYTELY) 236 g solution   Once        08/30/22 [place], MD 08/30/22 1520

## 2022-10-01 ENCOUNTER — Ambulatory Visit: Payer: Medicare HMO | Admitting: Podiatry

## 2022-12-20 ENCOUNTER — Ambulatory Visit: Payer: Medicare HMO | Admitting: Internal Medicine

## 2023-01-04 ENCOUNTER — Emergency Department (HOSPITAL_COMMUNITY): Payer: Medicare HMO

## 2023-01-04 ENCOUNTER — Emergency Department (HOSPITAL_COMMUNITY)
Admission: EM | Admit: 2023-01-04 | Discharge: 2023-01-04 | Disposition: A | Discharge status: Home / Self Care | Payer: Medicare HMO | Attending: Emergency Medicine | Admitting: Emergency Medicine

## 2023-01-04 DIAGNOSIS — Z7984 Long term (current) use of oral hypoglycemic drugs: Secondary | ICD-10-CM | POA: Insufficient documentation

## 2023-01-04 DIAGNOSIS — I10 Essential (primary) hypertension: Secondary | ICD-10-CM | POA: Insufficient documentation

## 2023-01-04 DIAGNOSIS — R0989 Other specified symptoms and signs involving the circulatory and respiratory systems: Secondary | ICD-10-CM | POA: Diagnosis present

## 2023-01-04 DIAGNOSIS — Z79899 Other long term (current) drug therapy: Secondary | ICD-10-CM | POA: Diagnosis not present

## 2023-01-04 DIAGNOSIS — E119 Type 2 diabetes mellitus without complications: Secondary | ICD-10-CM | POA: Insufficient documentation

## 2023-01-04 DIAGNOSIS — R09A2 Foreign body sensation, throat: Secondary | ICD-10-CM

## 2023-01-04 LAB — URINALYSIS, ROUTINE W REFLEX MICROSCOPIC
Bilirubin Urine: NEGATIVE
Glucose, UA: NEGATIVE mg/dL
Hgb urine dipstick: NEGATIVE
Ketones, ur: NEGATIVE mg/dL
Leukocytes,Ua: NEGATIVE
Nitrite: NEGATIVE
Protein, ur: NEGATIVE mg/dL
Specific Gravity, Urine: 1.011 (ref 1.005–1.030)
pH: 6 (ref 5.0–8.0)

## 2023-01-04 LAB — COMPREHENSIVE METABOLIC PANEL
ALT: 24 U/L (ref 0–44)
AST: 18 U/L (ref 15–41)
Albumin: 3.2 g/dL — ABNORMAL LOW (ref 3.5–5.0)
Alkaline Phosphatase: 44 U/L (ref 38–126)
Anion gap: 11 (ref 5–15)
BUN: 14 mg/dL (ref 8–23)
CO2: 27 mmol/L (ref 22–32)
Calcium: 9.7 mg/dL (ref 8.9–10.3)
Chloride: 98 mmol/L (ref 98–111)
Creatinine, Ser: 1.28 mg/dL — ABNORMAL HIGH (ref 0.61–1.24)
GFR, Estimated: >60 mL/min (ref >60)
Glucose, Bld: 129 mg/dL — ABNORMAL HIGH (ref 70–99)
Potassium: 4.5 mmol/L (ref 3.5–5.1)
Sodium: 136 mmol/L (ref 135–145)
Total Bilirubin: 0.6 mg/dL (ref 0.3–1.2)
Total Protein: 7.4 g/dL (ref 6.5–8.1)

## 2023-01-04 LAB — CBC WITH DIFFERENTIAL/PLATELET
Abs Immature Granulocytes: 0.01 10*3/uL (ref 0.00–0.07)
Basophils Absolute: 0 10*3/uL (ref 0.0–0.1)
Basophils Relative: 0 %
Eosinophils Absolute: 0.1 10*3/uL (ref 0.0–0.5)
Eosinophils Relative: 1 %
HCT: 46 % (ref 39.0–52.0)
Hemoglobin: 14.7 g/dL (ref 13.0–17.0)
Immature Granulocytes: 0 %
Lymphocytes Relative: 30 %
Lymphs Abs: 1.9 10*3/uL (ref 0.7–4.0)
MCH: 29.2 pg (ref 26.0–34.0)
MCHC: 32 g/dL (ref 30.0–36.0)
MCV: 91.5 fL (ref 80.0–100.0)
Monocytes Absolute: 1 10*3/uL (ref 0.1–1.0)
Monocytes Relative: 16 %
Neutro Abs: 3.4 10*3/uL (ref 1.7–7.7)
Neutrophils Relative %: 53 %
Platelets: 112 10*3/uL — ABNORMAL LOW (ref 150–400)
RBC: 5.03 MIL/uL (ref 4.22–5.81)
RDW: 15.1 % (ref 11.5–15.5)
WBC: 6.4 10*3/uL (ref 4.0–10.5)
nRBC: 0 % (ref 0.0–0.2)

## 2023-01-04 MED ORDER — LIDOCAINE VISCOUS HCL 2 % MT SOLN: 15.0000 mL | OROMUCOSAL | 0 refills | Status: DC | PRN

## 2023-01-04 MED ORDER — SODIUM CHLORIDE 0.9 % IV BOLUS
500.0000 mL | Freq: Once | INTRAVENOUS | Status: AC
  Administered 2023-01-04: 500 mL via INTRAVENOUS

## 2023-01-04 MED ORDER — LIDOCAINE VISCOUS HCL 2 % MT SOLN
15.0000 mL | Freq: Once | OROMUCOSAL | Status: AC
  Administered 2023-01-04: 15 mL via OROMUCOSAL
  Filled 2023-01-04: qty 15

## 2023-01-04 NOTE — ED Provider Notes (Signed)
[place] EMERGENCY DEPARTMENT AT [facility] Provider Note   CSN: [id number] Arrival date & time: 01/04/23  4098     History  Chief Complaint  Patient presents with   Globus    Bradley [name] is a 72 y.o. male.  Pt is a 72 yo male with pmhx significant for htn, hld, bipolar d/o, DM, and hx GI bleed.  Pt has seen [facility] in the past.  Pt said he thinks he has a fish bone stuck in his throat.  It hurts to swallow, so he's not been swallowing.  He can drink, but does not want to.       Home Medications Prior to Admission medications   Medication Sig Start Date End Date Taking? Authorizing Provider  lidocaine (XYLOCAINE) 2 % solution Use as directed 15 mLs in the mouth or throat as needed (mouth or throat pain). 01/04/23  Yes Jacalyn Lefevre, MD  acetaminophen (TYLENOL) 500 MG tablet Take 1 tablet (500 mg total) by mouth every 6 (six) hours as needed for mild pain or moderate pain. 03/28/17   Fawze, Mina A, PA-C  albuterol (PROVENTIL HFA;VENTOLIN HFA) 108 (90 Base) MCG/ACT inhaler Inhale 1-2 puffs into the lungs every 6 (six) hours as needed for wheezing or shortness of breath. 03/07/17   Rancour, Jeannett Senior, MD  amLODipine (NORVASC) 10 MG tablet Take 10 mg by mouth daily.    [provider]  diclofenac Sodium (VOLTAREN) 1 % GEL Apply 2 g topically 4 (four) times daily. 11/15/20   Jeannie Fend, PA-C  docusate sodium (COLACE) 100 MG capsule Take 1 capsule (100 mg total) by mouth every 12 (twelve) hours. 08/30/22   Pricilla Loveless, MD  dorzolamide-timolol (COSOPT) 22.3-6.8 MG/ML ophthalmic solution Place 1 drop into both eyes daily.  11/24/19   [provider]  hydrochlorothiazide (HYDRODIURIL) 25 MG tablet Take 25 mg by mouth daily. 01/10/20   [provider]  lisinopril (PRINIVIL,ZESTRIL) 40 MG tablet Take 40 mg by mouth daily.    [provider]  metFORMIN (GLUCOPHAGE) 1000 MG tablet Take 1,000 mg by mouth daily. 01/10/20   [provider]  nicotine (NICODERM CQ - DOSED IN MG/24 HOURS) 21 mg/24hr patch Place 1 patch (21 mg total) onto the skin daily. 12/16/20   Mannam, Colbert Coyer, MD  pravastatin (PRAVACHOL) 20 MG tablet Take 20 mg by mouth at bedtime. 02/02/20   [provider]  predniSONE (DELTASONE) 20 MG tablet Take 40mg  daily for 5 days 12/16/20   Mannam, Colbert Coyer, MD  umeclidinium-vilanterol (ANORO ELLIPTA) 62.5-25 MCG/INH AEPB Inhale 1 puff into the lungs daily. 12/16/20   Chilton Greathouse, MD      Allergies    Patient has no known allergies.    Review of Systems   Review of Systems  Gastrointestinal:        Pain with swallowing  All other systems reviewed and are negative.   Physical Exam Updated Vital Signs BP (!) 160/98   Pulse 78   Temp 97.7 F (36.5 C)   Resp 20   SpO2 96%  Physical Exam Vitals and nursing note reviewed.  Constitutional:      Appearance: Normal appearance. He is obese.  HENT:     Head: Normocephalic and atraumatic.     Right Ear: External ear normal.     Left Ear: External ear normal.     Nose: Nose normal.     Mouth/Throat:     Mouth: Mucous membranes are moist.  Pharynx: Oropharynx is clear.  Eyes:     Extraocular Movements: Extraocular movements intact.     Conjunctiva/sclera: Conjunctivae normal.     Pupils: Pupils are equal, round, and reactive to light.  Cardiovascular:     Rate and Rhythm: Normal rate and regular rhythm.     Pulses: Normal pulses.     Heart sounds: Normal heart sounds.  Pulmonary:     Effort: Pulmonary effort is normal.     Breath sounds: Normal breath sounds.  Abdominal:     General: Abdomen is flat. Bowel sounds are normal.     Palpations: Abdomen is soft.  Musculoskeletal:        General: Normal range of motion.     Cervical back: Normal range of motion and neck supple.  Skin:    General: Skin is warm.     Capillary Refill: Capillary refill takes less than 2 seconds.  Neurological:     General: No focal deficit present.     Mental  Status: He is alert and oriented to person, place, and time.  Psychiatric:        Mood and Affect: Mood normal.        Behavior: Behavior normal.     ED Results / Procedures / Treatments   Labs (all labs ordered are listed, but only abnormal results are displayed) Labs Reviewed  COMPREHENSIVE METABOLIC PANEL - Abnormal; Notable for the following components:      Result Value   Glucose, Bld 129 (*)    Creatinine, Ser 1.28 (*)    Albumin 3.2 (*)    All other components within normal limits  CBC WITH DIFFERENTIAL/PLATELET - Abnormal; Notable for the following components:   Platelets 112 (*)    All other components within normal limits  URINALYSIS, ROUTINE W REFLEX MICROSCOPIC    EKG None  Radiology DG Neck Soft Tissue  Result Date: 01/04/2023 CLINICAL DATA:  Evaluation of possible retained fishbone EXAM: NECK SOFT TISSUES - 2 VIEW COMPARISON:  None Available. FINDINGS: Decreased sensitivity and specificity for detailed findings due to motion artifact. There is no evidence of retropharyngeal soft tissue swelling or epiglottic enlargement. The cervical airway is unremarkable and no definite radio-opaque foreign body identified. Multilevel degenerative changes of the cervical spine. IMPRESSION: 1. Decreased sensitivity and specificity for detailed findings due to motion artifact. 2. No definite radio-opaque foreign body identified. Electronically Signed   By: Agustin Cree M.D.   On: 01/04/2023 10:06    Procedures Procedures    Medications Ordered in ED Medications  sodium chloride 0.9 % bolus 500 mL (500 mLs Intravenous New Bag/Given 01/04/23 0941)  lidocaine (XYLOCAINE) 2 % viscous mouth solution 15 mL (15 mLs Mouth/Throat Given 01/04/23 1610)    ED Course/ Medical Decision Making/ A&P                             Medical Decision Making Amount and/or Complexity of Data Reviewed Labs: ordered. Radiology: ordered.  Risk Prescription drug management.   This patient presents  to the ED for concern of fb sensation, this involves an extensive number of treatment options, and is a complaint that carries with it a high risk of complications and morbidity.  The differential diagnosis includes residual fb, esophageal abrasion   Co morbidities that complicate the patient evaluation  htn, hld, bipolar d/o, DM, and hx GI bleed   Additional history obtained:  Additional history obtained from epic chart review  Lab Tests:  I Ordered, and personally interpreted labs.  The pertinent results include:  cbc nl and cmp nl   Imaging Studies ordered:  I ordered imaging studies including soft tissue neck  I independently visualized and interpreted imaging which showed  Decreased sensitivity and specificity for detailed findings due  to motion artifact.  2. No definite radio-opaque foreign body identified.   I agree with the radiologist interpretation   Cardiac Monitoring:  The patient was maintained on a cardiac monitor.  I personally viewed and interpreted the cardiac monitored which showed an underlying rhythm of: nsr   Medicines ordered and prescription drug management:  I ordered medication including viscous lidocaine  for sx  Reevaluation of the patient after these medicines showed that the patient improved I have reviewed the patients home medicines and have made adjustments as needed  Problem List / ED Course:  Globus sensation:  I don't see a fish bone that I can get to.  Soft tissue neck nl, but they may not see a small fish bone.  After lidocaine, pt able to drink without problems.  He is stable for d/c.  Pt can f/u with GI.  Return if worse.    Reevaluation:  After the interventions noted above, I reevaluated the patient and found that they have :improved   Social Determinants of Health:  Lives at home   Dispostion:  After consideration of the diagnostic results and the patients response to treatment, I feel that the patent would benefit  from discharge with outpatient f/u.          Final Clinical Impression(s) / ED Diagnoses Final diagnoses:  Globus sensation    Rx / DC Orders ED Discharge Orders          Ordered    lidocaine (XYLOCAINE) 2 % solution  As needed        01/04/23 1133              Jacalyn Lefevre, MD 01/04/23 1136

## 2023-01-04 NOTE — ED Triage Notes (Signed)
Pt states that two days ago he was eating fish and feels like he had a bone stuck in his throat. Pt denies SOB. Pt states that since then he's been able to drink, but finds it difficult to eat d/t pain.

## 2023-02-01 DIAGNOSIS — H6122 Impacted cerumen, left ear: Secondary | ICD-10-CM | POA: Insufficient documentation

## 2023-02-01 DIAGNOSIS — H6592 Unspecified nonsuppurative otitis media, left ear: Secondary | ICD-10-CM

## 2023-02-01 HISTORY — DX: Unspecified nonsuppurative otitis media, left ear: H65.92

## 2023-02-23 ENCOUNTER — Other Ambulatory Visit: Payer: Self-pay

## 2023-02-23 ENCOUNTER — Emergency Department (HOSPITAL_COMMUNITY)
Admission: EM | Admit: 2023-02-23 | Discharge: 2023-02-23 | Disposition: A | Discharge status: Home / Self Care | Payer: Medicare HMO | Attending: Emergency Medicine | Admitting: Emergency Medicine

## 2023-02-23 DIAGNOSIS — Z79899 Other long term (current) drug therapy: Secondary | ICD-10-CM | POA: Insufficient documentation

## 2023-02-23 DIAGNOSIS — M79644 Pain in right finger(s): Secondary | ICD-10-CM | POA: Diagnosis not present

## 2023-02-23 DIAGNOSIS — I10 Essential (primary) hypertension: Secondary | ICD-10-CM | POA: Diagnosis not present

## 2023-02-23 DIAGNOSIS — M79641 Pain in right hand: Secondary | ICD-10-CM | POA: Diagnosis present

## 2023-02-23 DIAGNOSIS — E119 Type 2 diabetes mellitus without complications: Secondary | ICD-10-CM | POA: Diagnosis not present

## 2023-02-23 NOTE — Discharge Instructions (Signed)
As we discussed, I suspect this lesion on your thumb is a bruise. It should resolve on its own in the next few days.  While draining the area may improve your pain, it introduces the risk of developing an infection and therefore is not advised.  I recommend that you rest, ice, compress, and elevate this area and take Tylenol/ibuprofen as needed for pain.  Follow-up with your primary care doctor for recheck in a few days.  Return if development of any new or worsening symptoms.

## 2023-02-23 NOTE — ED Triage Notes (Signed)
Pt. Stated, it looks like I have blood in my thumb for 4 days, Denies any injury, feels like heartbeat.

## 2023-02-23 NOTE — ED Provider Notes (Signed)
[place] EMERGENCY DEPARTMENT AT [facility] Provider Note   CSN: [id number] Arrival date & time: 02/23/23  4098     History  Chief Complaint  Patient presents with   Hand Pain    Bradley [name] is a 72 y.o. male.  Patient with a history of hypertension, hyperlipidemia, diabetes presents today with complaints of hand pain.  He states that he has what looks like a bruise on the tip of his right thumb.  He first noticed it 4 days ago and it has been persistent and unchanged since onset. Notes that pain is throbbing in nature. Denies any known trauma. Denies any history of similar symptoms previously.  No similar appearing lesions on any of his other fingers. Patient denies any history of IVDU. No history of Rheumatic heart disease. Patient is originally from [place], moved to the [place] in [place]. Denies any other symptoms including fevers, chills, chest pain, shortness of breath.   The history is provided by the patient. No language interpreter was used.  Hand Pain       Home Medications Prior to Admission medications   Medication Sig Start Date End Date Taking? Authorizing Provider  acetaminophen (TYLENOL) 500 MG tablet Take 1 tablet (500 mg total) by mouth every 6 (six) hours as needed for mild pain or moderate pain. 03/28/17   Fawze, Mina A, PA-C  albuterol (PROVENTIL HFA;VENTOLIN HFA) 108 (90 Base) MCG/ACT inhaler Inhale 1-2 puffs into the lungs every 6 (six) hours as needed for wheezing or shortness of breath. 03/07/17   Rancour, Jeannett Senior, MD  amLODipine (NORVASC) 10 MG tablet Take 10 mg by mouth daily.    [provider]  diclofenac Sodium (VOLTAREN) 1 % GEL Apply 2 g topically 4 (four) times daily. 11/15/20   Jeannie Fend, PA-C  docusate sodium (COLACE) 100 MG capsule Take 1 capsule (100 mg total) by mouth every 12 (twelve) hours. 08/30/22   Pricilla Loveless, MD  dorzolamide-timolol (COSOPT) 22.3-6.8 MG/ML ophthalmic solution Place 1 drop into both eyes daily.   11/24/19   [provider]  hydrochlorothiazide (HYDRODIURIL) 25 MG tablet Take 25 mg by mouth daily. 01/10/20   [provider]  lidocaine (XYLOCAINE) 2 % solution Use as directed 15 mLs in the mouth or throat as needed (mouth or throat pain). 01/04/23   Jacalyn Lefevre, MD  lisinopril (PRINIVIL,ZESTRIL) 40 MG tablet Take 40 mg by mouth daily.    [provider]  metFORMIN (GLUCOPHAGE) 1000 MG tablet Take 1,000 mg by mouth daily. 01/10/20   [provider]  nicotine (NICODERM CQ - DOSED IN MG/24 HOURS) 21 mg/24hr patch Place 1 patch (21 mg total) onto the skin daily. 12/16/20   Mannam, Colbert Coyer, MD  pravastatin (PRAVACHOL) 20 MG tablet Take 20 mg by mouth at bedtime. 02/02/20   [provider]  predniSONE (DELTASONE) 20 MG tablet Take 40mg  daily for 5 days 12/16/20   Mannam, Colbert Coyer, MD  umeclidinium-vilanterol (ANORO ELLIPTA) 62.5-25 MCG/INH AEPB Inhale 1 puff into the lungs daily. 12/16/20   Chilton Greathouse, MD      Allergies    Patient has no known allergies.    Review of Systems   Review of Systems  All other systems reviewed and are negative.   Physical Exam Updated Vital Signs BP (!) 173/98 (BP Location: Right Arm)   Pulse 85   Temp 98.1 F (36.7 C) (Oral)   Resp 20   Ht 5\' 8"  (1.727 m)   Wt 112.5 kg  SpO2 93%   BMI 37.71 kg/m  Physical Exam Vitals and nursing note reviewed.  Constitutional:      General: He is not in acute distress.    Appearance: Normal appearance. He is normal weight. He is not ill-appearing, toxic-appearing or diaphoretic.  HENT:     Head: Normocephalic and atraumatic.  Cardiovascular:     Rate and Rhythm: Normal rate and regular rhythm.     Heart sounds: Normal heart sounds. No murmur heard. Pulmonary:     Effort: Pulmonary effort is normal. No respiratory distress.  Musculoskeletal:        General: Normal range of motion.     Cervical back: Normal range of motion.  Skin:    General: Skin is warm and dry.      Comments: Tender hypopigmented area to the tip of the right thumb. See images below for further. No purulence, fluctuance, or induration. ROM intact without pain. Sensation intact. Capillary refill less than 2 seconds. No lesions on other fingers or palms.   Neurological:     General: No focal deficit present.     Mental Status: He is alert.  Psychiatric:        Mood and Affect: Mood normal.        Behavior: Behavior normal.     ED Results / Procedures / Treatments   Labs (all labs ordered are listed, but only abnormal results are displayed) Labs Reviewed - No data to display  EKG None  Radiology No results found.  Procedures Procedures    Medications Ordered in ED Medications - No data to display  ED Course/ Medical Decision Making/ A&P                             Medical Decision Making  This patient is a 72 y.o. male who presents to the ED for concern of right thumb pain.   Differential diagnoses prior to evaluation: Bruise, skin cancer, emboli, paronychia  Past Medical History / Social History / Additional history: Chart reviewed. Pertinent results include: history of hypertension, hyperlipidemia, diabetes   Physical Exam: Physical exam performed. The pertinent findings include: see image above   Disposition: After consideration of the diagnostic results and the patients response to treatment, I feel that emergency department workup does not suggest an emergent condition requiring admission or immediate intervention beyond what has been performed at this time. The plan is: discharge with [facility]ICE and close follow-up. Patient without any systemic symptoms or risk factors or other lesions to suspect septic emboli. There is no signs of infection that would warrant drainage. He has good ROM and no known injury, no indication for x-ray imaging. Suspect this is a hematoma, it is not worsening and there is no wound. Evaluation and diagnostic testing in the emergency  department does not suggest an emergent condition requiring admission or immediate intervention beyond what has been performed at this time.  Plan for discharge with close PCP follow-up.  Patient is understanding and amenable with plan, educated on red flag symptoms that would prompt immediate return.  Patient discharged in stable condition.  Findings and plan of care discussed with supervising physician Dr. Eloise Harman who is in agreement.   Final Clinical Impression(s) / ED Diagnoses Final diagnoses:  Thumb pain, right    Rx / DC Orders ED Discharge Orders     None     An After Visit Summary was printed and given to the patient.  Vear Clock 02/23/23 1024    Rondel Baton, MD 02/24/23 [phone]

## 2023-02-23 NOTE — Progress Notes (Unsigned)
  Cardiology Office Note:   Date:  02/23/2023  ID:  Bradley [name], DOB 14-Dec-1950, MRN [id number]  History of Present Illness:   Bradley [name] is a 72 y.o. male bipolar II disorder, HTN, DMII, and HLD who was referred by Quita Skye for further management of hypertension.  Today, the patient overall feels well. No chest pain, SOB, LE edema, orthopnea or PND. Patient is active and walks about without issue.   Blood pressure has been running 130-150s/80s mainly.  Past Medical History:  Diagnosis Date   Arthritis    Bipolar 2 disorder (HCC) 2005   Also carries diagnosis of schizophrenia.   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       ROS: As per HPI  Studies Reviewed:    EKG:  NSR, PVC, TWI anterolateral TWI-personally reviewed       Risk Assessment/Calculations:              Physical Exam:   VS:  There were no vitals taken for this visit.   Wt Readings from Last 3 Encounters:  02/23/23 248 lb (112.5 kg)  04/25/21 256 lb 3.2 oz (116.2 kg)  12/16/20 256 lb 3.2 oz (116.2 kg)     GEN: Well nourished, well developed in no acute distress NECK: No JVD; No carotid bruits CARDIAC: RRR, no murmurs, rubs, gallops RESPIRATORY:  Scattered expiratory wheezing ABDOMEN: Soft, non-tender, non-distended EXTREMITIES:  No edema; No deformity   ASSESSMENT AND PLAN:   #HTN: -Currently, BP 140-150s/80s -Continue hydrochlorothiazide 25mg  daily  -Continue lisinopril 40mg  daily -Continue amlodipine 10mg  daily -Add bystolic 10mg  daily -Will have him follow-up with Pharm D for medication titration  #HLD: -Continue pravastatin 20mg  daily -Check lipids once fasting  #DMII: -Stopped metformin as he was having "low sugars -Management per  PCP  #Tobacco Abuse: -Encouraged cessation        Signed, Meriam Sprague, MD

## 2023-02-25 ENCOUNTER — Ambulatory Visit: Payer: Medicare HMO | Attending: Internal Medicine | Admitting: Cardiology

## 2023-02-25 ENCOUNTER — Encounter: Payer: Self-pay | Admitting: Cardiology

## 2023-02-25 VITALS — BP 140/88 | HR 75 | Ht 68.0 in | Wt 251.6 lb

## 2023-02-25 DIAGNOSIS — Z79899 Other long term (current) drug therapy: Secondary | ICD-10-CM | POA: Diagnosis not present

## 2023-02-25 DIAGNOSIS — E785 Hyperlipidemia, unspecified: Secondary | ICD-10-CM | POA: Diagnosis not present

## 2023-02-25 DIAGNOSIS — I10 Essential (primary) hypertension: Secondary | ICD-10-CM

## 2023-02-25 MED ORDER — NEBIVOLOL HCL 10 MG PO TABS: 10.0000 mg | ORAL_TABLET | Freq: Every day | ORAL | 2 refills | Status: DC

## 2023-02-25 NOTE — Patient Instructions (Signed)
Medication Instructions:   START TAKING BYSTOLIC 10 MG BY MOUTH DAILY  *If you need a refill on your cardiac medications before your next appointment, please call your pharmacy*   You have been referred to OUR BLOOD PRESSURE CLINIC TO SEE THE PHARMACIST FOR MEDICATION TITRATION--PLEASE HAVE YOUR LAB WORK TO CHECK LIPIDS SCHEDULED SAME DAY AS THIS APPOINTMENT    Lab Work:  SAME DAY AS YOU COME INTO THE OFFICE TO SEE THE PHARMACIST IN BLOOD PRESSURE CLINIC--LIPIDS--PLEASE COME FASTING TO THIS LAB APPOINTMENT  If you have labs (blood work) drawn today and your tests are completely normal, you will receive your results only by: MyChart Message (if you have MyChart) OR A paper copy in the mail If you have any lab test that is abnormal or we need to change your treatment, we will call you to review the results.     Follow-Up: At [facility], you and your health needs are our priority.  As part of our continuing mission to provide you with exceptional heart care, we have created designated Provider Care Teams.  These Care Teams include your primary Cardiologist (physician) and Advanced Practice Providers (APPs -  Physician Assistants and Nurse Practitioners) who all work together to provide you with the care you need, when you need it.  We recommend signing up for the patient portal called "MyChart".  Sign up information is provided on this After Visit Summary.  MyChart is used to connect with patients for Virtual Visits (Telemedicine).  Patients are able to view lab/test results, encounter notes, upcoming appointments, etc.  Non-urgent messages can be sent to your provider as well.   To learn more about what you can do with MyChart, go to [email or web address].    Your next appointment:   6 month(s)  Provider:   DR. Izora Ribas

## 2023-02-27 ENCOUNTER — Other Ambulatory Visit: Payer: Self-pay

## 2023-02-27 ENCOUNTER — Emergency Department (HOSPITAL_COMMUNITY)
Admission: EM | Admit: 2023-02-27 | Discharge: 2023-02-27 | Discharge status: Against Medical Advice | Payer: Medicare HMO | Attending: Emergency Medicine | Admitting: Emergency Medicine

## 2023-02-27 ENCOUNTER — Encounter (HOSPITAL_COMMUNITY): Payer: Self-pay

## 2023-02-27 DIAGNOSIS — R2231 Localized swelling, mass and lump, right upper limb: Secondary | ICD-10-CM | POA: Insufficient documentation

## 2023-02-27 DIAGNOSIS — Z5321 Procedure and treatment not carried out due to patient leaving prior to being seen by health care provider: Secondary | ICD-10-CM | POA: Insufficient documentation

## 2023-02-27 NOTE — ED Notes (Signed)
Pt states he can't wait any longer and will come back tomorrow. Pt OTF

## 2023-02-27 NOTE — ED Triage Notes (Signed)
Pt sent by PCP to get right thumb drained. Pt has 2+ swelling of right thumb. The tip of right thumb is ecchymotic.

## 2023-02-28 ENCOUNTER — Emergency Department (HOSPITAL_COMMUNITY): Payer: Medicare HMO

## 2023-02-28 ENCOUNTER — Emergency Department (HOSPITAL_COMMUNITY)
Admission: EM | Admit: 2023-02-28 | Discharge: 2023-02-28 | Disposition: A | Discharge status: Home / Self Care | Payer: Medicare HMO | Attending: Emergency Medicine | Admitting: Emergency Medicine

## 2023-02-28 ENCOUNTER — Other Ambulatory Visit: Payer: Self-pay

## 2023-02-28 DIAGNOSIS — L03011 Cellulitis of right finger: Secondary | ICD-10-CM | POA: Diagnosis not present

## 2023-02-28 DIAGNOSIS — E119 Type 2 diabetes mellitus without complications: Secondary | ICD-10-CM | POA: Insufficient documentation

## 2023-02-28 DIAGNOSIS — M79644 Pain in right finger(s): Secondary | ICD-10-CM | POA: Diagnosis present

## 2023-02-28 DIAGNOSIS — Z7984 Long term (current) use of oral hypoglycemic drugs: Secondary | ICD-10-CM | POA: Diagnosis not present

## 2023-02-28 DIAGNOSIS — Z79899 Other long term (current) drug therapy: Secondary | ICD-10-CM | POA: Insufficient documentation

## 2023-02-28 DIAGNOSIS — I10 Essential (primary) hypertension: Secondary | ICD-10-CM | POA: Diagnosis not present

## 2023-02-28 LAB — CBC WITH DIFFERENTIAL/PLATELET
Abs Immature Granulocytes: 0.03 10*3/uL (ref 0.00–0.07)
Basophils Absolute: 0 10*3/uL (ref 0.0–0.1)
Basophils Relative: 0 %
Eosinophils Absolute: 0 10*3/uL (ref 0.0–0.5)
Eosinophils Relative: 0 %
HCT: 45.2 % (ref 39.0–52.0)
Hemoglobin: 14.3 g/dL (ref 13.0–17.0)
Immature Granulocytes: 0 %
Lymphocytes Relative: 22 %
Lymphs Abs: 1.7 10*3/uL (ref 0.7–4.0)
MCH: 29.3 pg (ref 26.0–34.0)
MCHC: 31.6 g/dL (ref 30.0–36.0)
MCV: 92.6 fL (ref 80.0–100.0)
Monocytes Absolute: 1.3 10*3/uL — ABNORMAL HIGH (ref 0.1–1.0)
Monocytes Relative: 16 %
Neutro Abs: 4.7 10*3/uL (ref 1.7–7.7)
Neutrophils Relative %: 62 %
Platelets: 107 10*3/uL — ABNORMAL LOW (ref 150–400)
RBC: 4.88 MIL/uL (ref 4.22–5.81)
RDW: 14.9 % (ref 11.5–15.5)
WBC: 7.8 10*3/uL (ref 4.0–10.5)
nRBC: 0 % (ref 0.0–0.2)

## 2023-02-28 LAB — BASIC METABOLIC PANEL
Anion gap: 11 (ref 5–15)
BUN: 29 mg/dL — ABNORMAL HIGH (ref 8–23)
CO2: 22 mmol/L (ref 22–32)
Calcium: 8.9 mg/dL (ref 8.9–10.3)
Chloride: 100 mmol/L (ref 98–111)
Creatinine, Ser: 1.45 mg/dL — ABNORMAL HIGH (ref 0.61–1.24)
GFR, Estimated: 52 mL/min — ABNORMAL LOW (ref >60)
Glucose, Bld: 108 mg/dL — ABNORMAL HIGH (ref 70–99)
Potassium: 5 mmol/L (ref 3.5–5.1)
Sodium: 133 mmol/L — ABNORMAL LOW (ref 135–145)

## 2023-02-28 MED ORDER — LIDOCAINE HCL (PF) 1 % IJ SOLN
10.0000 mL | Freq: Once | INTRAMUSCULAR | Status: AC
  Administered 2023-02-28: 10 mL
  Filled 2023-02-28: qty 10

## 2023-02-28 MED ORDER — MORPHINE SULFATE (PF) 4 MG/ML IV SOLN
4.0000 mg | Freq: Once | INTRAVENOUS | Status: AC
  Administered 2023-02-28: 4 mg via INTRAMUSCULAR
  Filled 2023-02-28: qty 1

## 2023-02-28 MED ORDER — AMOXICILLIN-POT CLAVULANATE 875-125 MG PO TABS
1.0000 | ORAL_TABLET | Freq: Once | ORAL | Status: AC
  Administered 2023-02-28: 1 via ORAL
  Filled 2023-02-28: qty 1

## 2023-02-28 MED ORDER — AMOXICILLIN-POT CLAVULANATE 875-125 MG PO TABS: 1.0000 | ORAL_TABLET | Freq: Two times a day (BID) | ORAL | 0 refills | Status: DC

## 2023-02-28 MED ORDER — BACITRACIN ZINC 500 UNIT/GM EX OINT
TOPICAL_OINTMENT | CUTANEOUS | Status: AC
  Administered 2023-02-28: 1 via TOPICAL

## 2023-02-28 NOTE — Discharge Instructions (Addendum)
You were seen in the ER for evaluation of your right thumb pain. I am glad we were able to help you with this. In order to prevent infection, you will need to clean your thumb at least once a day with Dial soap and water. I am prescribing your an oral antibiotic to take twice a day for the next 7 days.  You have already gotten your first dose here.  I am also prescribing you a topical antibiotic cream to place over the top. Please make sure you are changing the bandage daily and I would leave keeping replacing the bandage for the next 4 days. You can take Tylenol/ibuprofen as needed for pain. Please make sure that you follow up with your primary care provider for re-evaluation. If you have any concerns, new or worsening symptoms, please return to the ER for re-evaluation.   Contact a doctor if: You feel worse. You do not get better. You keep having or you have more fluid, blood, or pus coming from the affected area. Your affected finger, toe, or joint gets swollen or hard to move. You have a fever or chills. There is redness spreading from the affected area.

## 2023-02-28 NOTE — Consult Note (Signed)
Reason for Consult:Right thumb infection Referring Physician: Meridee Score Time called: 1610 Time at bedside: 0857   Bradley [name] is an 72 y.o. male.  HPI: Norm comes to the ED with a 9d hx/o right thumb pain. He denies any known trauma or wounds. He denies fevers, chills, sweats, N/V. He is RHD and retired.  Past Medical History:  Diagnosis Date   Arthritis    Bipolar 2 disorder (HCC) 2005   Also carries diagnosis of schizophrenia.   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      Past Surgical History:  Procedure Laterality Date   COLONOSCOPY  2009, 2015   ENTEROSCOPY N/A 07/28/2015   Procedure: ENTEROSCOPY;  Surgeon: Meryl Dare, MD;  Location: [facility] ENDOSCOPY;  Service: Endoscopy;  Laterality: N/A;    Family History  Problem Relation Age of Onset   Cancer Brother        abdominal CA   Colon cancer Neg Hx    Esophageal cancer Neg Hx    Rectal cancer Neg Hx    Stomach cancer Neg Hx     Social History:  reports that he has been smoking cigarettes. He has a 20 pack-year smoking history. He has never used smokeless tobacco. He reports that he does not drink alcohol and does not use drugs.  Allergies: No Known Allergies  Medications: I have reviewed the patient's current medications.  No results found for this or any previous visit (from the past 48 hour(s)).  DG Finger Thumb Right  Result Date: 02/28/2023 CLINICAL DATA:  Pain and swelling EXAM: RIGHT THUMB 2+V COMPARISON:  None Available. FINDINGS: No recent fracture or dislocation is seen. No focal lytic lesions are seen. 2 mm smoothly marginated calcification adjacent to the base of distal phalanx may suggest small sesamoid bone or old ununited avulsion. Small bony spurs are noted in first  carpometacarpal and first metacarpophalangeal joints. No opaque foreign bodies are seen. There are no pockets of air in soft tissues. IMPRESSION: No recent fracture or dislocation is seen. There are no focal lytic lesions. There are no opaque foreign bodies in soft tissues. Electronically Signed   By: Ernie Avena M.D.   On: 02/28/2023 07:41    Review of Systems  HENT:  Negative for ear discharge, ear pain, hearing loss and tinnitus.   Eyes:  Negative for photophobia and pain.  Respiratory:  Negative for cough and shortness of breath.   Cardiovascular:  Negative for chest pain.  Gastrointestinal:  Negative for abdominal pain, nausea and vomiting.  Genitourinary:  Negative for dysuria, flank pain, frequency and urgency.  Musculoskeletal:  Positive for arthralgias (Righ thumb). Negative for back pain, myalgias and neck pain.  Neurological:  Negative for dizziness and headaches.  Hematological:  Does not bruise/bleed easily.  Psychiatric/Behavioral:  The patient is not nervous/anxious.    Blood pressure (!) 140/84, pulse 68, temperature 97.8 F (36.6 C), temperature source Oral, resp. rate 16, height 5\' 8"  (1.727 m), weight 114 kg, SpO2 94%. Physical Exam Constitutional:      General: He is not in acute distress.    Appearance: He is well-developed. He is not diaphoretic.  HENT:     Head: Normocephalic and atraumatic.  Eyes:     General: No scleral  icterus.       Right eye: No discharge.        Left eye: No discharge.     Conjunctiva/sclera: Conjunctivae normal.  Cardiovascular:     Rate and Rhythm: Normal rate and regular rhythm.  Pulmonary:     Effort: Pulmonary effort is normal. No respiratory distress.  Musculoskeletal:     Cervical back: Normal range of motion.     Comments: Right shoulder, elbow, wrist, digits- no skin wounds, obvious paronychia present, ulnar>radial, pad TTP but not tense, no instability, no blocks to motion  Sens  Ax/R/M/U intact  Mot   Ax/ R/ PIN/ M/  AIN/ U intact  Rad 2+  Skin:    General: Skin is warm and dry.  Neurological:     Mental Status: He is alert.  Psychiatric:        Mood and Affect: Mood normal.        Behavior: Behavior normal.     Assessment/Plan: Right thumb paronychia -- Ok for ED to I&D, home on Keflex. May f/u with Dr. Yehuda Budd next week.    Freeman Caldron, PA-C Orthopedic Surgery [phone] 02/28/2023, 9:09 AM

## 2023-02-28 NOTE — ED Triage Notes (Signed)
Sent by pcp to get rt thumb drained. Ecchymotic tip of thumb. Pt DM2

## 2023-02-28 NOTE — ED Provider Notes (Signed)
[place] EMERGENCY DEPARTMENT AT [facility] Provider Note   CSN: [id number] Arrival date & time: 02/28/23  2130     History  Chief Complaint  Patient presents with   Finger Injury    thumb    Bradley [name] is a 72 y.o. male with h/o DM, HTN, bipolar, presents to the ER for evaluation of left thumb pain. The patient reports that it has been present for the past 7-10 days and has been gradually worsening. He denies any previous injury to the thumb. He denies any fevers. He was sent over by his PCP for drainage. He denies any allergies to any medications.   HPI     Home Medications Prior to Admission medications   Medication Sig Start Date End Date Taking? Authorizing Provider  amoxicillin-clavulanate (AUGMENTIN) 875-125 MG tablet Take 1 tablet by mouth every 12 (twelve) hours. 02/28/23  Yes Achille Rich, PA-C  acetaminophen (TYLENOL) 500 MG tablet Take 1 tablet (500 mg total) by mouth every 6 (six) hours as needed for mild pain or moderate pain. 03/28/17   Fawze, Mina A, PA-C  albuterol (PROVENTIL HFA;VENTOLIN HFA) 108 (90 Base) MCG/ACT inhaler Inhale 1-2 puffs into the lungs every 6 (six) hours as needed for wheezing or shortness of breath. 03/07/17   Rancour, Jeannett Senior, MD  amLODipine (NORVASC) 10 MG tablet Take 10 mg by mouth daily.    [provider]  diclofenac Sodium (VOLTAREN) 1 % GEL Apply 2 g topically 4 (four) times daily. 11/15/20   Jeannie Fend, PA-C  docusate sodium (COLACE) 100 MG capsule Take 1 capsule (100 mg total) by mouth every 12 (twelve) hours. 08/30/22   Pricilla Loveless, MD  dorzolamide-timolol (COSOPT) 22.3-6.8 MG/ML ophthalmic solution Place 1 drop into both eyes daily.  11/24/19   [provider]  hydrochlorothiazide (HYDRODIURIL) 25 MG tablet Take 25 mg by mouth daily. 01/10/20   [provider]  lidocaine (XYLOCAINE) 2 % solution Use as directed 15 mLs in the mouth or throat as needed (mouth or throat pain). 01/04/23    Jacalyn Lefevre, MD  lisinopril (PRINIVIL,ZESTRIL) 40 MG tablet Take 40 mg by mouth daily.    [provider]  metFORMIN (GLUCOPHAGE) 1000 MG tablet Take 1,000 mg by mouth daily. 01/10/20   [provider]  nebivolol (BYSTOLIC) 10 MG tablet Take 1 tablet (10 mg total) by mouth daily. 02/25/23   Meriam Sprague, MD  nicotine (NICODERM CQ - DOSED IN MG/24 HOURS) 21 mg/24hr patch Place 1 patch (21 mg total) onto the skin daily. 12/16/20   Mannam, Colbert Coyer, MD  pravastatin (PRAVACHOL) 20 MG tablet Take 20 mg by mouth at bedtime. 02/02/20   [provider]  predniSONE (DELTASONE) 20 MG tablet Take 40mg  daily for 5 days 12/16/20   Mannam, Colbert Coyer, MD  umeclidinium-vilanterol (ANORO ELLIPTA) 62.5-25 MCG/INH AEPB Inhale 1 puff into the lungs daily. 12/16/20   Chilton Greathouse, MD      Allergies    Patient has no known allergies.    Review of Systems   Review of Systems  Constitutional:  Negative for chills and fever.  Skin:  Positive for wound.    Physical Exam Updated Vital Signs BP 134/70   Pulse (!) 52   Temp 98.2 F (36.8 C)   Resp 16   Ht 5\' 8"  (1.727 m)   Wt 114 kg   SpO2 90%   BMI 38.21 kg/m  Physical Exam Vitals and nursing note reviewed.  Constitutional:  General: He is not in acute distress.    Appearance: Normal appearance. He is not toxic-appearing.  Eyes:     General: No scleral icterus. Pulmonary:     Effort: Pulmonary effort is normal. No respiratory distress.  Musculoskeletal:        General: Tenderness present.     Comments: Tenderness to the distal tip of the patient's thumb.  There is some discoloration in and suspect will purulence seem to be ulnar aspect of the patient's left thumb.  There is firmness and swelling to the distal tip as well.  Tenderness upon palpation.  Minimal flexion of the thumb secondary to swelling.  Pain with passive range of motion.  Abduction and adduction of the thumb intact however.  Palpable radial pulses.   See images.  Skin:    General: Skin is dry.  Neurological:     Mental Status: He is alert. Mental status is at baseline.            ED Results / Procedures / Treatments   Labs (all labs ordered are listed, but only abnormal results are displayed) Labs Reviewed  CBC WITH DIFFERENTIAL/PLATELET - Abnormal; Notable for the following components:      Result Value   Platelets 107 (*)    Monocytes Absolute 1.3 (*)    All other components within normal limits  BASIC METABOLIC PANEL - Abnormal; Notable for the following components:   Sodium 133 (*)    Glucose, Bld 108 (*)    BUN 29 (*)    Creatinine, Ser 1.45 (*)    GFR, Estimated 52 (*)    All other components within normal limits    EKG None  Radiology DG Finger Thumb Right  Result Date: 02/28/2023 CLINICAL DATA:  Pain and swelling EXAM: RIGHT THUMB 2+V COMPARISON:  None Available. FINDINGS: No recent fracture or dislocation is seen. No focal lytic lesions are seen. 2 mm smoothly marginated calcification adjacent to the base of distal phalanx may suggest small sesamoid bone or old ununited avulsion. Small bony spurs are noted in first carpometacarpal and first metacarpophalangeal joints. No opaque foreign bodies are seen. There are no pockets of air in soft tissues. IMPRESSION: No recent fracture or dislocation is seen. There are no focal lytic lesions. There are no opaque foreign bodies in soft tissues. Electronically Signed   By: Ernie Avena M.D.   On: 02/28/2023 07:41    Procedures Drain paronychia  Date/Time: 02/28/2023 11:20 AM  Performed by: Achille Rich, PA-C Authorized by: Achille Rich, PA-C  Consent: Verbal consent obtained. Risks and benefits: risks, benefits and alternatives were discussed Consent given by: patient Patient understanding: patient states understanding of the procedure being performed Required items: required blood products, implants, devices, and special equipment available Patient  identity confirmed: verbally with patient Local anesthesia used: yes Anesthesia: digital block  Anesthesia: Local anesthesia used: yes Local Anesthetic: lidocaine 1% without epinephrine Anesthetic total: 5 mL Patient tolerance: patient tolerated the procedure well with no immediate complications Comments: Base of the thumb cleaned to alcohol.  Digital block placed with lidocaine without epi.  Anesthesia was achieved.  I then cleansed the nail with alcohol prep as well.  I placed the 11 blade along the cuticle line removing the cuticle from the nail.  Significant amount of green malodorous purulence expressed as well as blood.  Significant decrease in swelling.  Patient now has flexion extension of thumb given improvement of swelling.  Patient reports that the pain is much better.  Bacitracin ointment placed with nonadherent and Coban.  Patient tolerated procedure well.      Medications Ordered in ED Medications  morphine (PF) 4 MG/ML injection 4 mg (4 mg Intramuscular Given 02/28/23 0845)  lidocaine (PF) (XYLOCAINE) 1 % injection 10 mL (10 mLs Other Given 02/28/23 0926)  amoxicillin-clavulanate (AUGMENTIN) 875-125 MG per tablet 1 tablet (1 tablet Oral Given 02/28/23 0949)  bacitracin ointment (1 Application Topical Given 02/28/23 0949)    ED Course/ Medical Decision Making/ A&P Clinical Course as of 02/28/23 1126  Thu Feb 28, 2023  [id number] 72 year old male here with worsening right thumb swelling and pain.  Third visit for same.  Looks markedly worse than prior reports with swelling fullness and probably some early pus forming around the nail, will review with hand surgery for probable I&D. [MB]    Clinical Course User Index [MB] Terrilee Files, MD                           Medical Decision Making Amount and/or Complexity of Data Reviewed Labs: ordered. Radiology: ordered.  Risk OTC drugs. Prescription drug management.   72 y.o. male presents to the ER for evaluation of thumb  pain. Differential diagnosis includes but is not limited to paronychia versus felon versus tenosynovitis. Vital signs show mildly elevated blood pressure otherwise unremarkable. Physical exam as noted above.   X-ray imaging shows no recent fracture or dislocation is seen. There are no focal lytic lesions. There are no opaque foreign bodies in soft tissues.  I independently reviewed and interpreted the patient's labs.  BMP shows sodium at 133 with glucose at 108.  Creatinine is slightly elevated at 1.45 from patient's previous around 1.2.  CBC shows no leukocytosis or anemia.  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  Consult placed to Charma Igo with orthopedics who will evaluate the patient at bedside.   Charma Igo, PA-C assessed the patient at bedside.  Thinks this is a paronychia.  Has very low suspicion for any felon or tenosynovitis. Recommends I&D.  Please see procedure note for more information.  Will start the patient on some Augmentin given the duration of this paronychia and the extent of it.  He is already been evaluated by hand surgery and been cleared of felon and or tenosynovitis.  Pain is improved after paronychia drainage.  Discussed with him wound care and keeping the wound clean and covered.  Recommended follow-up with his primary care doctor for reevaluation of the area.  The patient does have slightly increase in his creatinine, I recommended staying well-hydrated drinking fluids.  I think ischemia improved outpatient with increase in p.o. instead of IVF at this time.  We discussed the results of the labs/imaging. The plan is take antibiotic as prescribed, clean, follow-up with PCP. We discussed strict return precautions and red flag symptoms. The patient verbalized their understanding and agrees to the plan. The patient is stable and being discharged home in good condition.  Portions of this report may have been transcribed using voice recognition software. Every effort  was made to ensure accuracy; however, inadvertent computerized transcription errors may be present.   Final Clinical Impression(s) / ED Diagnoses Final diagnoses:  Acute paronychia of right thumb    Rx / DC Orders ED Discharge Orders          Ordered    amoxicillin-clavulanate (AUGMENTIN) 875-125 MG tablet  Every 12 hours        02/28/23 0942  Achille Rich, PA-C 02/28/23 1127    Terrilee Files, MD 02/28/23 1726

## 2023-02-28 NOTE — ED Notes (Signed)
Patient transported to X-ray 

## 2023-03-19 ENCOUNTER — Ambulatory Visit: Payer: Medicare HMO

## 2023-04-05 ENCOUNTER — Encounter: Payer: Self-pay | Admitting: Student

## 2023-04-05 ENCOUNTER — Ambulatory Visit: Payer: Medicare HMO

## 2023-04-05 ENCOUNTER — Ambulatory Visit: Payer: Medicare HMO | Attending: Internal Medicine | Admitting: Student

## 2023-04-05 VITALS — BP 128/68 | HR 67 | Ht 68.0 in | Wt 247.6 lb

## 2023-04-05 DIAGNOSIS — Z79899 Other long term (current) drug therapy: Secondary | ICD-10-CM

## 2023-04-05 DIAGNOSIS — I10 Essential (primary) hypertension: Secondary | ICD-10-CM

## 2023-04-05 DIAGNOSIS — E119 Type 2 diabetes mellitus without complications: Secondary | ICD-10-CM | POA: Diagnosis not present

## 2023-04-05 DIAGNOSIS — E785 Hyperlipidemia, unspecified: Secondary | ICD-10-CM

## 2023-04-05 LAB — LIPID PANEL
Chol/HDL Ratio: 3.5 ratio (ref 0.0–5.0)
Cholesterol, Total: 147 mg/dL (ref 100–199)
HDL: 42 mg/dL (ref >39)
LDL Chol Calc (NIH): 76 mg/dL (ref 0–99)
Triglycerides: 170 mg/dL — ABNORMAL HIGH (ref 0–149)
VLDL Cholesterol Cal: 29 mg/dL (ref 5–40)

## 2023-04-05 NOTE — Assessment & Plan Note (Signed)
Assessment: BP is controlled in office BP 128/68 mmHg heart rate 67 (goal <130/80) Home BP ~130/75 range patient does not recall heart rate  Takes his BP medication regularly and tolerates them well without any side effects Denies SOB, palpitation, chest pain, headaches,or swelling Reiterated the importance of regular exercise and low salt diet   Plan:  Given controlled BP at home and during OV not changing any medication Continue taking hydrochlorothiazide 25mg  daily, lisinopril 40mg  daily, amlodipine 10mg  daily, Bystolic 10mg  daily Patient to keep record of BP readings with heart rate and report to [place] at the next visit Patient to bring home BP machine for validation at next OV in 4 weeks  Patient to see PharmD in 4 weeks for follow up  Follow up lab(s): none

## 2023-04-05 NOTE — Assessment & Plan Note (Signed)
Assessment and Plan:  Last A1c 7.20 Mar 2022 BMI 37.65 and weight 247 lbs  Patient currently not on any BG lowering medication  Discussed GLP1 - its benefit on weight and s/e, patient is interested in going on GLP1  Will assess insurance coverage for Ozempic or [employer]

## 2023-04-05 NOTE — Patient Instructions (Signed)
No Medication Changes made by your pharmacist Carmela Hurt, PharmD at today's visit:  Cut down on salt intake and add some resistance exercise ( chair exercise) 15-20 min twice week   Bring all of your meds, your BP cuff and your record of home blood pressures to your next appointment.    HOW TO TAKE YOUR BLOOD PRESSURE AT HOME  Rest 5 minutes before taking your blood pressure.  Don't smoke or drink caffeinated beverages for at least 30 minutes before. Take your blood pressure before (not after) you eat. Sit comfortably with your back supported and both feet on the floor (don't cross your legs). Elevate your arm to heart level on a table or a desk. Use the proper sized cuff. It should fit smoothly and snugly around your bare upper arm. There should be enough room to slip a fingertip under the cuff. The bottom edge of the cuff should be 1 inch above the crease of the elbow. Ideally, take 3 measurements at one sitting and record the average.  Important lifestyle changes to control high blood pressure  Intervention  Effect on the BP  Lose extra pounds and watch your waistline Weight loss is one of the most effective lifestyle changes for controlling blood pressure. If you're overweight or obese, losing even a small amount of weight can help reduce blood pressure. Blood pressure might go down by about 1 millimeter of mercury (mm Hg) with each kilogram (about 2.2 pounds) of weight lost.  Exercise regularly As a general goal, aim for at least 30 minutes of moderate physical activity every day. Regular physical activity can lower high blood pressure by about 5 to 8 mm Hg.  Eat a healthy diet Eating a diet rich in whole grains, fruits, vegetables, and low-fat dairy products and low in saturated fat and cholesterol. A healthy diet can lower high blood pressure by up to 11 mm Hg.  Reduce salt (sodium) in your diet Even a small reduction of sodium in the diet can improve heart health and reduce high  blood pressure by about 5 to 6 mm Hg.  Limit alcohol One drink equals 12 ounces of beer, 5 ounces of wine, or 1.5 ounces of 80-proof liquor.  Limiting alcohol to less than one drink a day for women or two drinks a day for men can help lower blood pressure by about 4 mm Hg.   If you have any questions or concerns please use My Chart to send questions or call the office at [phone]

## 2023-04-05 NOTE — Progress Notes (Signed)
Patient ID: Bradley [name]                 DOB: [birth year]                      MRN: [id number]      HPI: Bradley [name] is a 72 y.o. male referred by Dr. Shari Prows  to HTN clinic. PMH is significant for  bipolar II disorder, HTN, DMII, and HLD  Pt saw Dr. Shari Prows on 02/25/2023 BP was elevated Bystolic 10 mg was added to thiazide, ACEi, and CCB. patient went to ED for acute paronychia of R thumb. Pt was rx Augmentin BID x 7d. Patient saw PCP on 07/23 BP that OV 124/83 heart rate 52.   Patient presented today for hypertension follow up. Reports he tolerates Bystolic and other BP meds well. He takes them regularly. Since he started Bystolic his BP started saying in ~130/ 75 range on home BP monitor. He eats only home cooked traditional meals with lots of herbs/spices and salt. He does not eat fried food. He usually go for walk around his building for 20 min 3 times per week. Does not do any resistance exercise. He will be going to his PCP soon for smoking cessation consultation.     Current HTN meds: hydrochlorothiazide 25mg  daily, lisinopril 40mg  daily, amlodipine 10mg  daily, Bystolic 10mg  daily  BP goal: <130/80  Family History: parent did no have any issue they just died from old age   Social History:  Smoking: active, every day 1 pack/day for 21.6 years  Alcohol: none  Diet: mainly eats home cooked meals - does not watch salt intake advised to cut down on salt intake - replace reg salt with Mrs.Dash   Exercise:  20 min walks 3 times per week   Home BP readings: 130/75    Wt Readings from Last 3 Encounters:  04/05/23 247 lb 9.6 oz (112.3 kg)  02/28/23 251 lb 5.2 oz (114 kg)  02/27/23 251 lb 8.7 oz (114.1 kg)   BP Readings from Last 3 Encounters:  04/05/23 128/68  02/28/23 134/70  02/27/23 (!) 144/63   Pulse Readings from Last 3 Encounters:  04/05/23 67  02/28/23 (!) 52  02/27/23 (!) 52    Renal function: CrCl cannot be calculated (Patient's most recent lab result  is older than the maximum 21 days allowed.).  Past Medical History:  Diagnosis Date   Arthritis    Bipolar 2 disorder (HCC) 2005   Also carries diagnosis of schizophrenia.   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      Current Outpatient Medications on File Prior to Visit  Medication Sig Dispense Refill   acetaminophen (TYLENOL) 500 MG tablet Take 1 tablet (500 mg total) by mouth every 6 (six) hours as needed for mild pain or moderate pain. 30 tablet 0   albuterol (PROVENTIL HFA;VENTOLIN HFA) 108 (90 Base) MCG/ACT inhaler Inhale 1-2 puffs into the lungs every 6 (six) hours as needed for wheezing or shortness of breath. 1 Inhaler 0   amLODipine (NORVASC) 10 MG tablet Take 10 mg by mouth daily.     amoxicillin-clavulanate (AUGMENTIN) 875-125 MG tablet Take 1 tablet by mouth every 12 (twelve) hours. 13 tablet 0  diclofenac Sodium (VOLTAREN) 1 % GEL Apply 2 g topically 4 (four) times daily. 100 g 0   docusate sodium (COLACE) 100 MG capsule Take 1 capsule (100 mg total) by mouth every 12 (twelve) hours. 60 capsule 0   dorzolamide-timolol (COSOPT) 22.3-6.8 MG/ML ophthalmic solution Place 1 drop into both eyes daily.      hydrochlorothiazide (HYDRODIURIL) 25 MG tablet Take 25 mg by mouth daily.     lidocaine (XYLOCAINE) 2 % solution Use as directed 15 mLs in the mouth or throat as needed (mouth or throat pain). 100 mL 0   lisinopril (PRINIVIL,ZESTRIL) 40 MG tablet Take 40 mg by mouth daily.     metFORMIN (GLUCOPHAGE) 1000 MG tablet Take 1,000 mg by mouth daily.     nebivolol (BYSTOLIC) 10 MG tablet Take 1 tablet (10 mg total) by mouth daily. 90 tablet 2   nicotine (NICODERM CQ - DOSED IN MG/24 HOURS) 21 mg/24hr patch Place 1 patch (21 mg total) onto the skin daily. 28 patch 0    pravastatin (PRAVACHOL) 20 MG tablet Take 20 mg by mouth at bedtime.     predniSONE (DELTASONE) 20 MG tablet Take 40mg  daily for 5 days 10 tablet 0   umeclidinium-vilanterol (ANORO ELLIPTA) 62.5-25 MCG/INH AEPB Inhale 1 puff into the lungs daily. 60 each 2   No current facility-administered medications on file prior to visit.    No Known Allergies  Blood pressure 128/68, pulse 67, height 5\' 8"  (1.727 m), weight 247 lb 9.6 oz (112.3 kg), SpO2 98%.   Assessment/Plan:  1. Hypertension -  HTN (hypertension), benign Assessment: BP is controlled in office BP 128/68 mmHg heart rate 67 (goal <130/80) Home BP ~130/75 range patient does not recall heart rate  Takes his BP medication regularly and tolerates them well without any side effects Denies SOB, palpitation, chest pain, headaches,or swelling Reiterated the importance of regular exercise and low salt diet   Plan:  Given controlled BP at home and during OV not changing any medication Continue taking hydrochlorothiazide 25mg  daily, lisinopril 40mg  daily, amlodipine 10mg  daily, Bystolic 10mg  daily Patient to keep record of BP readings with heart rate and report to [place] at the next visit Patient to bring home BP machine for validation at next OV in 4 weeks  Patient to see PharmD in 4 weeks for follow up  Follow up lab(s): none    Diabetes mellitus without complication (HCC) Assessment and Plan:  Last A1c 7.20 Mar 2022 BMI 37.65 and weight 247 lbs  Patient currently not on any BG lowering medication  Discussed GLP1 - its benefit on weight and s/e, patient is interested in going on GLP1  Will assess insurance coverage for Ozempic or Mounjaro     Thank you  Carmela Hurt, Pharm.D [facility] A Division of [facility] [address] [place], [place] [address]  Phone: [phone]; Fax: [phone]

## 2023-04-09 ENCOUNTER — Telehealth: Payer: Self-pay | Admitting: Pharmacist

## 2023-04-09 ENCOUNTER — Other Ambulatory Visit (HOSPITAL_COMMUNITY): Payer: Self-pay

## 2023-04-09 DIAGNOSIS — E785 Hyperlipidemia, unspecified: Secondary | ICD-10-CM

## 2023-04-09 MED ORDER — PRAVASTATIN SODIUM 40 MG PO TABS: 40.0000 mg | ORAL_TABLET | Freq: Every evening | ORAL | 3 refills | Status: DC

## 2023-04-09 NOTE — Telephone Encounter (Signed)
We will be checking for [facility] or Ozempic coverage. increasing pravastatin dose from 20 mg to 40 mg as LDLc goal is <55 mg/dl (need ~84% LDLc lowering effect) once he tolerates 40 mg  that can consider adding Zetia. We see him in 4 weeks for HTN follow up.

## 2023-04-09 NOTE — Telephone Encounter (Signed)
Pharmacy Patient Advocate Encounter   Received notification from Physician's Office that prior authorization for [facility] is required/requested.   Insurance verification completed.   The patient is insured through [employer] .   Per test claim: The current 28 day co-pay is, $0.  No PA needed at this time. This test claim was processed through [facility]- copay amounts may vary at other pharmacies due to pharmacy/plan contracts, or as the patient moves through the different stages of their insurance plan.

## 2023-04-09 NOTE — Telephone Encounter (Signed)
BMI 38.22 will assess Coverage for GLP1

## 2023-04-11 ENCOUNTER — Telehealth: Payer: Self-pay

## 2023-04-11 NOTE — Telephone Encounter (Signed)
-----   Message from Bradley [name] sent at 04/08/2023  6:14 PM EDT ----- Results: LDL above goal for CAC, increases TGs Plan: If no GLP-1RA, increase pravastatin does and 3 month labs  Bradley Constant, MD

## 2023-04-11 NOTE — Telephone Encounter (Signed)
Results reviewed with pt. Pharmacist has already addressed results with pt please see telephone encounter from 04/09/23.

## 2023-04-17 NOTE — Telephone Encounter (Addendum)
Call to inform patient about GLP1 coverage and to check on Pravastatin tolerability.  N/A LVM to call back [phone]

## 2023-04-23 NOTE — Telephone Encounter (Signed)
Patient called stating he was returning our call. Not sure what we were call for. I will forward to [place].

## 2023-04-23 NOTE — Telephone Encounter (Signed)
Spoke to pt, states he is tolerating pravastatin well. Informed him about GLP1 coverage in agreement to start Ozempic but need training on how to administer and potential s/e and its management.  Patient advised to get prescription from pharmacy and bring it to the next OV with PharmD  on 09/16.

## 2023-05-06 ENCOUNTER — Ambulatory Visit: Payer: Medicare HMO

## 2023-06-11 ENCOUNTER — Encounter (HOSPITAL_COMMUNITY): Payer: Self-pay

## 2023-06-11 ENCOUNTER — Emergency Department (HOSPITAL_COMMUNITY): Payer: Medicare HMO

## 2023-06-11 ENCOUNTER — Inpatient Hospital Stay (HOSPITAL_COMMUNITY)
Admission: EM | Admit: 2023-06-11 | Discharge: 2023-06-25 | DRG: 329 | Disposition: A | Discharge status: Skilled Nursing Facility | Payer: Medicare HMO | Attending: Internal Medicine | Admitting: Internal Medicine

## 2023-06-11 ENCOUNTER — Other Ambulatory Visit: Payer: Self-pay

## 2023-06-11 DIAGNOSIS — N189 Chronic kidney disease, unspecified: Secondary | ICD-10-CM | POA: Diagnosis not present

## 2023-06-11 DIAGNOSIS — G4733 Obstructive sleep apnea (adult) (pediatric): Secondary | ICD-10-CM | POA: Diagnosis present

## 2023-06-11 DIAGNOSIS — K5651 Intestinal adhesions [bands], with partial obstruction: Principal | ICD-10-CM | POA: Diagnosis present

## 2023-06-11 DIAGNOSIS — Z751 Person awaiting admission to adequate facility elsewhere: Secondary | ICD-10-CM

## 2023-06-11 DIAGNOSIS — N179 Acute kidney failure, unspecified: Principal | ICD-10-CM

## 2023-06-11 DIAGNOSIS — K56600 Partial intestinal obstruction, unspecified as to cause: Secondary | ICD-10-CM | POA: Diagnosis not present

## 2023-06-11 DIAGNOSIS — D62 Acute posthemorrhagic anemia: Secondary | ICD-10-CM | POA: Diagnosis not present

## 2023-06-11 DIAGNOSIS — E785 Hyperlipidemia, unspecified: Secondary | ICD-10-CM | POA: Diagnosis present

## 2023-06-11 DIAGNOSIS — F319 Bipolar disorder, unspecified: Secondary | ICD-10-CM | POA: Diagnosis present

## 2023-06-11 DIAGNOSIS — K648 Other hemorrhoids: Secondary | ICD-10-CM | POA: Diagnosis not present

## 2023-06-11 DIAGNOSIS — E871 Hypo-osmolality and hyponatremia: Secondary | ICD-10-CM | POA: Diagnosis present

## 2023-06-11 DIAGNOSIS — R799 Abnormal finding of blood chemistry, unspecified: Secondary | ICD-10-CM

## 2023-06-11 DIAGNOSIS — E119 Type 2 diabetes mellitus without complications: Secondary | ICD-10-CM | POA: Diagnosis present

## 2023-06-11 DIAGNOSIS — I129 Hypertensive chronic kidney disease with stage 1 through stage 4 chronic kidney disease, or unspecified chronic kidney disease: Secondary | ICD-10-CM | POA: Diagnosis not present

## 2023-06-11 DIAGNOSIS — Z8546 Personal history of malignant neoplasm of prostate: Secondary | ICD-10-CM

## 2023-06-11 DIAGNOSIS — D125 Benign neoplasm of sigmoid colon: Secondary | ICD-10-CM | POA: Diagnosis not present

## 2023-06-11 DIAGNOSIS — F250 Schizoaffective disorder, bipolar type: Secondary | ICD-10-CM | POA: Diagnosis present

## 2023-06-11 DIAGNOSIS — J9811 Atelectasis: Secondary | ICD-10-CM | POA: Diagnosis not present

## 2023-06-11 DIAGNOSIS — K9189 Other postprocedural complications and disorders of digestive system: Secondary | ICD-10-CM | POA: Diagnosis not present

## 2023-06-11 DIAGNOSIS — Z9911 Dependence on respirator [ventilator] status: Secondary | ICD-10-CM | POA: Diagnosis not present

## 2023-06-11 DIAGNOSIS — D6959 Other secondary thrombocytopenia: Secondary | ICD-10-CM | POA: Diagnosis present

## 2023-06-11 DIAGNOSIS — K5900 Constipation, unspecified: Secondary | ICD-10-CM | POA: Diagnosis not present

## 2023-06-11 DIAGNOSIS — C61 Malignant neoplasm of prostate: Secondary | ICD-10-CM | POA: Diagnosis present

## 2023-06-11 DIAGNOSIS — I10 Essential (primary) hypertension: Secondary | ICD-10-CM | POA: Diagnosis present

## 2023-06-11 DIAGNOSIS — E861 Hypovolemia: Secondary | ICD-10-CM | POA: Diagnosis present

## 2023-06-11 DIAGNOSIS — Z6836 Body mass index (BMI) 36.0-36.9, adult: Secondary | ICD-10-CM

## 2023-06-11 DIAGNOSIS — Z433 Encounter for attention to colostomy: Secondary | ICD-10-CM

## 2023-06-11 DIAGNOSIS — I2510 Atherosclerotic heart disease of native coronary artery without angina pectoris: Secondary | ICD-10-CM | POA: Diagnosis present

## 2023-06-11 DIAGNOSIS — F17210 Nicotine dependence, cigarettes, uncomplicated: Secondary | ICD-10-CM | POA: Diagnosis present

## 2023-06-11 DIAGNOSIS — Z933 Colostomy status: Principal | ICD-10-CM

## 2023-06-11 DIAGNOSIS — J95821 Acute postprocedural respiratory failure: Secondary | ICD-10-CM | POA: Diagnosis not present

## 2023-06-11 DIAGNOSIS — Z79899 Other long term (current) drug therapy: Secondary | ICD-10-CM

## 2023-06-11 DIAGNOSIS — Z808 Family history of malignant neoplasm of other organs or systems: Secondary | ICD-10-CM

## 2023-06-11 DIAGNOSIS — F3181 Bipolar II disorder: Secondary | ICD-10-CM | POA: Diagnosis present

## 2023-06-11 DIAGNOSIS — E860 Dehydration: Secondary | ICD-10-CM | POA: Diagnosis present

## 2023-06-11 DIAGNOSIS — R9389 Abnormal findings on diagnostic imaging of other specified body structures: Secondary | ICD-10-CM | POA: Diagnosis present

## 2023-06-11 DIAGNOSIS — I444 Left anterior fascicular block: Secondary | ICD-10-CM | POA: Diagnosis present

## 2023-06-11 DIAGNOSIS — E875 Hyperkalemia: Secondary | ICD-10-CM | POA: Diagnosis not present

## 2023-06-11 DIAGNOSIS — K567 Ileus, unspecified: Secondary | ICD-10-CM | POA: Diagnosis not present

## 2023-06-11 DIAGNOSIS — R933 Abnormal findings on diagnostic imaging of other parts of digestive tract: Secondary | ICD-10-CM | POA: Diagnosis not present

## 2023-06-11 DIAGNOSIS — E66811 Obesity, class 1: Secondary | ICD-10-CM | POA: Diagnosis present

## 2023-06-11 DIAGNOSIS — G9341 Metabolic encephalopathy: Secondary | ICD-10-CM | POA: Diagnosis not present

## 2023-06-11 DIAGNOSIS — K5730 Diverticulosis of large intestine without perforation or abscess without bleeding: Secondary | ICD-10-CM | POA: Diagnosis present

## 2023-06-11 DIAGNOSIS — K56699 Other intestinal obstruction unspecified as to partial versus complete obstruction: Secondary | ICD-10-CM | POA: Diagnosis not present

## 2023-06-11 DIAGNOSIS — K56609 Unspecified intestinal obstruction, unspecified as to partial versus complete obstruction: Secondary | ICD-10-CM | POA: Diagnosis not present

## 2023-06-11 DIAGNOSIS — M1990 Unspecified osteoarthritis, unspecified site: Secondary | ICD-10-CM | POA: Diagnosis present

## 2023-06-11 DIAGNOSIS — Z720 Tobacco use: Secondary | ICD-10-CM

## 2023-06-11 DIAGNOSIS — E1122 Type 2 diabetes mellitus with diabetic chronic kidney disease: Secondary | ICD-10-CM | POA: Diagnosis not present

## 2023-06-11 DIAGNOSIS — E559 Vitamin D deficiency, unspecified: Secondary | ICD-10-CM | POA: Diagnosis present

## 2023-06-11 HISTORY — DX: Diverticulitis of large intestine with perforation and abscess without bleeding: K57.20

## 2023-06-11 LAB — CBC
HCT: 47.7 % (ref 39.0–52.0)
Hemoglobin: 15 g/dL (ref 13.0–17.0)
MCH: 29.1 pg (ref 26.0–34.0)
MCHC: 31.4 g/dL (ref 30.0–36.0)
MCV: 92.6 fL (ref 80.0–100.0)
Platelets: 211 10*3/uL (ref 150–400)
RBC: 5.15 MIL/uL (ref 4.22–5.81)
RDW: 15.3 % (ref 11.5–15.5)
WBC: 7.4 10*3/uL (ref 4.0–10.5)
nRBC: 0 % (ref 0.0–0.2)

## 2023-06-11 LAB — URINALYSIS, ROUTINE W REFLEX MICROSCOPIC
Bilirubin Urine: NEGATIVE
Glucose, UA: NEGATIVE mg/dL
Hgb urine dipstick: NEGATIVE
Ketones, ur: NEGATIVE mg/dL
Leukocytes,Ua: NEGATIVE
Nitrite: NEGATIVE
Protein, ur: NEGATIVE mg/dL
Specific Gravity, Urine: 1.015 (ref 1.005–1.030)
pH: 5 (ref 5.0–8.0)

## 2023-06-11 LAB — COMPREHENSIVE METABOLIC PANEL
ALT: 19 U/L (ref 0–44)
AST: 12 U/L — ABNORMAL LOW (ref 15–41)
Albumin: 3.7 g/dL (ref 3.5–5.0)
Alkaline Phosphatase: 38 U/L (ref 38–126)
Anion gap: 14 (ref 5–15)
BUN: 67 mg/dL — ABNORMAL HIGH (ref 8–23)
CO2: 20 mmol/L — ABNORMAL LOW (ref 22–32)
Calcium: 8.4 mg/dL — ABNORMAL LOW (ref 8.9–10.3)
Chloride: 100 mmol/L (ref 98–111)
Creatinine, Ser: 3.07 mg/dL — ABNORMAL HIGH (ref 0.61–1.24)
GFR, Estimated: 21 mL/min — ABNORMAL LOW (ref >60)
Glucose, Bld: 143 mg/dL — ABNORMAL HIGH (ref 70–99)
Potassium: 4.8 mmol/L (ref 3.5–5.1)
Sodium: 134 mmol/L — ABNORMAL LOW (ref 135–145)
Total Bilirubin: 0.8 mg/dL (ref 0.3–1.2)
Total Protein: 8.2 g/dL — ABNORMAL HIGH (ref 6.5–8.1)

## 2023-06-11 LAB — LIPASE, BLOOD: Lipase: 24 U/L (ref 11–51)

## 2023-06-11 MED ORDER — LACTATED RINGERS IV BOLUS
1000.0000 mL | Freq: Once | INTRAVENOUS | Status: AC
  Administered 2023-06-11: 1000 mL via INTRAVENOUS

## 2023-06-11 MED ORDER — ENOXAPARIN SODIUM 30 MG/0.3ML IJ SOSY: 30.0000 mg | PREFILLED_SYRINGE | INTRAMUSCULAR | Status: DC

## 2023-06-11 MED ORDER — INSULIN ASPART 100 UNIT/ML IJ SOLN: 0.0000 [IU] | INTRAMUSCULAR | Status: DC

## 2023-06-11 MED ORDER — RISPERIDONE 0.5 MG PO TABS
0.5000 mg | ORAL_TABLET | Freq: Every day | ORAL | Status: DC
  Administered 2023-06-11: 0.5 mg via ORAL
  Filled 2023-06-11: qty 1

## 2023-06-11 MED ORDER — ACETAMINOPHEN 325 MG PO TABS
650.0000 mg | ORAL_TABLET | Freq: Four times a day (QID) | ORAL | Status: DC | PRN
  Administered 2023-06-12: 650 mg via ORAL
  Filled 2023-06-11: qty 2

## 2023-06-11 MED ORDER — SODIUM CHLORIDE 0.9 % IV SOLN: INTRAVENOUS | Status: AC

## 2023-06-11 MED ORDER — POLYETHYLENE GLYCOL 3350 17 G PO PACK
17.0000 g | PACK | Freq: Every day | ORAL | Status: DC
  Administered 2023-06-11 – 2023-06-12 (×2): 17 g via ORAL
  Filled 2023-06-11 (×2): qty 1

## 2023-06-11 MED ORDER — AMLODIPINE BESYLATE 10 MG PO TABS
10.0000 mg | ORAL_TABLET | Freq: Every day | ORAL | Status: DC
  Administered 2023-06-12 – 2023-06-14 (×3): 10 mg via ORAL
  Filled 2023-06-11 (×3): qty 1

## 2023-06-11 MED ORDER — ONDANSETRON HCL 4 MG/2ML IJ SOLN
4.0000 mg | Freq: Once | INTRAMUSCULAR | Status: AC
  Administered 2023-06-11: 4 mg via INTRAVENOUS
  Filled 2023-06-11: qty 2

## 2023-06-11 MED ORDER — DIVALPROEX SODIUM ER 250 MG PO TB24
500.0000 mg | ORAL_TABLET | Freq: Two times a day (BID) | ORAL | Status: DC
  Administered 2023-06-11 – 2023-06-14 (×6): 500 mg via ORAL
  Filled 2023-06-11 (×7): qty 1

## 2023-06-11 MED ORDER — HYDROCODONE-ACETAMINOPHEN 5-325 MG PO TABS
1.0000 | ORAL_TABLET | Freq: Once | ORAL | Status: AC
  Administered 2023-06-11: 1 via ORAL
  Filled 2023-06-11: qty 1

## 2023-06-11 MED ORDER — PRAVASTATIN SODIUM 10 MG PO TABS
10.0000 mg | ORAL_TABLET | Freq: Every day | ORAL | Status: DC
  Filled 2023-06-11: qty 1

## 2023-06-11 NOTE — Assessment & Plan Note (Addendum)
Creatinine on admission 3.07 with a BUN of 67.  Likely prerenal etiology.  Begun on IV NS 100 cc/hour.  Held lisinopril.  UA unremarkable. - Admit to FMTS, Dr. Manson Passey as attending - AM CBC, BMP, TSH, A1c - Echocardiogram - Strict I/Os - Hold home lisinopril + HCTZ - PT/OT eval/treat orders placed - FeNa - Consider nephrology consult - IV fluids x 12 hours

## 2023-06-11 NOTE — ED Notes (Signed)
Patient transported to CT 

## 2023-06-11 NOTE — ED Provider Notes (Signed)
Physical Exam  BP 118/64   Pulse 80   Temp 97.9 F (36.6 C)   Resp (!) 22   Ht 5\' 8"  (1.727 m)   Wt 109.8 kg   SpO2 100%   BMI 36.80 kg/m   Physical Exam Constitutional:      General: He is not in acute distress.    Appearance: He is not toxic-appearing.     Comments: Lying in bed in no acute distress  Eyes:     General: No scleral icterus. Cardiovascular:     Rate and Rhythm: Normal rate.  Pulmonary:     Effort: Pulmonary effort is normal. No respiratory distress.  Skin:    General: Skin is warm and dry.  Neurological:     Mental Status: He is alert.     Procedures  Procedures  ED Course / MDM    Medical Decision Making Amount and/or Complexity of Data Reviewed Labs: ordered. Radiology: ordered.  Risk Prescription drug management. Decision regarding hospitalization.   Accepted handoff at shift change from [facility]. Please see prior provider note for more detail.   Briefly: Patient is 72 y.o. M presenting to the ER for evaluation of constipation without having a BM. The patient was discovered to have an AKI and will need admission based on that.   DDX: concern for possible SBO  Plan: follow up on CT imaging and admit.   The patient is lying in bed in no acute distress.  CT imaging shows : 1. Diffuse small and large bowel dilation to the level of the distal descending colon, where there is a lobulated intraluminal density, suspicious for colonic mass. Recommend colonoscopy for further evaluation. 2. Extensive colonic diverticulosis without evidence of acute diverticulitis. 3. Bilateral common iliac arteries measure 1.9 cm. 4. Aortic Atherosclerosis (ICD10-I70.0). Coronary artery calcifications. Assessment for potential risk factor modification, dietary therapy or pharmacologic therapy may be warranted, if clinically indicated. Per radiologist's read.  Given the patinet's AKI, he will need to be admitted.   CT ABDOMEN PELVIS WO CONTRAST  Result Date:  06/11/2023 CLINICAL DATA:  Several day history of diffuse abdominal pain and constipation. * Tracking Code: BO * EXAM: CT ABDOMEN AND PELVIS WITHOUT CONTRAST TECHNIQUE: Multidetector CT imaging of the abdomen and pelvis was performed following the standard protocol without IV contrast. RADIATION DOSE REDUCTION: This exam was performed according to the departmental dose-optimization program which includes automated exposure control, adjustment of the mA and/or kV according to patient size and/or use of iterative reconstruction technique. COMPARISON:  Same-day abdominal radiograph, CT abdomen and pelvis dated 12/27/2016, MR abdomen dated 03/28/2021 FINDINGS: Lower chest: Left lower lobe nodule measures 5 x 2 mm (5:13), unchanged. No specific follow-up imaging recommended. No pleural effusion or pneumothorax demonstrated. Left atrial enlargement. Coronary artery calcifications. Hepatobiliary: No focal hepatic lesions. No intra or extrahepatic biliary ductal dilation. Normal gallbladder. Pancreas: No focal lesions or main ductal dilation. Spleen: Normal in size without focal abnormality. Adrenals/Urinary Tract: No adrenal nodules. No calculi or hydronephrosis. Multifocal bilateral exophytic lesions, many of which appear mildly hyperattenuating, correspond to simple and minimally complex cysts on prior MRI. No specific follow-up imaging recommended. No focal bladder wall thickening. Stomach/Bowel: Normal appearance of the stomach. Diffuse small and large bowel dilation to the level of the distal descending colon, where there is a lobulated intraluminal density (3:47). Extensive colonic diverticulosis. Normal appendix. Vascular/Lymphatic: Aortic atherosclerosis. Bilateral common iliac arteries measure 1.9 cm. No enlarged abdominal or pelvic lymph nodes. Reproductive: Enlargement of the  prostate with median lobe hypertrophy. Prostate fiducials in-situ. Other: No free fluid, fluid collection, or free air. Musculoskeletal:  No acute or abnormal lytic or blastic osseous lesions. IMPRESSION: 1. Diffuse small and large bowel dilation to the level of the distal descending colon, where there is a lobulated intraluminal density, suspicious for colonic mass. Recommend colonoscopy for further evaluation. 2. Extensive colonic diverticulosis without evidence of acute diverticulitis. 3. Bilateral common iliac arteries measure 1.9 cm. 4. Aortic Atherosclerosis (ICD10-I70.0). Coronary artery calcifications. Assessment for potential risk factor modification, dietary therapy or pharmacologic therapy may be warranted, if clinically indicated. Electronically Signed   By: Agustin Cree M.D.   On: 06/11/2023 18:23   DG Abdomen 1 View  Result Date: 06/11/2023 CLINICAL DATA:  Constipation for 8 days. EXAM: ABDOMEN - 1 VIEW COMPARISON:  X-ray 08/30/2022 FINDINGS: Transverse colon is mildly dilated up to 8.7 cm. There also some dilated loops of small bowel throughout the midabdomen measuring up to 5.3 cm. Minimal air in the rectum. Mild stool. No obvious free air but these are supine radiographs. Degenerative changes of the spine and pelvis. Treatment changes in the area of the prostate. IMPRESSION: Dilated loops of small and large bowel. This has has a differential including ileus or developing obstruction. With findings would recommend further workup with a contrast CT scan of the abdomen and pelvis when clinically appropriate Electronically Signed   By: Karen Kays M.D.   On: 06/11/2023 13:31         Achille Rich, Cordelia Poche 06/11/23 1900    Benjiman Core, MD 06/11/23 [phone]

## 2023-06-11 NOTE — H&P (Cosign Needed Addendum)
Hospital Admission History and Physical Service Pager: [phone]  Patient name: Bradley [name] Medical record number: [id number] Date of Birth: 05-18-1951 Age: 72 y.o. Gender: male  Primary Care Provider: Medicine, [facility]: Will need nephro and GI consult in AM  Code Status: FULL  Preferred Emergency Contact:  Contact Information     Name Relation Home Work Mobile   Urick,[name] Daughter   [phone]      Other Contacts   None on File     Chief Complaint: Constipation  AKI   Assessment and Plan: Espen Lorincz is a 72 y.o. male presenting with constipation and AKI. Differential for presentation of this includes prerenal AKI from hypovolemia, decreased cardiac output, sepsis, intrinsic renal causes from ATN, ischemia, nephrotoxins, glomerulonephritis, vasculitis. Ddx includes: postrenal causes including obstruction of urinary tract.  Hypovolemia: Likely secondary to poor p.o. intake - patient has not been eating/drinking water well since constipation began 1 week ago.  Sepsis: Afebrile. HDS. Nephrotoxic medications: Patient on home lisinopril 40 mg daily and HCTZ.  Finding of "lobulated intraluminal density, suspicious for colonic mass" responsible for diffuse small and large bowel dilation to distal descending colon on CT A/P without contrast.  Differential includes:  Bowel cancer:  Denies weight loss, night sweats.  Benign 4 mm adenoma removed 2020. Mesenteric ischemia: Denies sharp, debilitating episodes of pain. Assessment & Plan AKI (acute kidney injury) (HCC) Creatinine on admission 3.07 with a BUN of 67.  Likely prerenal etiology.  Begun on IV NS 100 cc/hour.  Held lisinopril.  UA unremarkable. - Admit to FMTS, Dr. Manson Passey as attending - AM CBC, BMP, TSH, A1c - Echocardiogram - Strict I/Os - Hold home lisinopril + HCTZ - PT/OT eval/treat orders placed - FeNa - Consider nephrology consult - IV fluids x 12 hours Abnormal  CT scan Patient has evidence of a lobulated intraluminal density that is concerning for colonic mass.  Denies B symptoms. History of colon adenomas.  Endoscopy 2020: One 4 mm polyp removed found to be tubular adenoma, no high-grade dysplasia or malignancy.  Notably, diagnosed with T1c adenocarcinoma of prostate in 2021, status post radiotherapy. Will need to have further GI involvement, likely including follow-up colonoscopy. - GI consult tomorrow a.m. Constipation, unspecified constipation type Patient initially presented for constipation, CT abdomen pelvis was obtained - passing flatus, last yesterday. History of radiotherapy, possibly contributing to adhesions?  - Starting MiraLAX/senna Elevated BUN No symptoms of uremia at this time.   Chronic and Stable Problems:  Hypertension: Hydrochlorothiazide 25 mg daily, lisinopril 40 mg daily, amlodipine 10 mg daily.  Patient is not taking home Bystolic 10mg  although on med list. Will hold antihypertensives that are nephrotoxic: Namely, lisinopril.  DM2: A1c 7.1, not on medication. CBGs 4xdaily and SSI.  Bipolar disorder II: Decreased risperidone 2 mg nightly ? 0.5 mg at bedtime in setting of acute kidney injury. Depakote home dose, no renal adjustment needed   FEN/GI: NPO at midnight in anticipation for possible scope  VTE Prophylaxis: Lovenox 30 mg, renal dose  Disposition: Med surg   History of Present Illness:  Bradley [name] is a 72 y.o. male with a past history of prostate cancer status post radiotherapy (2021) presenting with constipation of 1 week and AKI ongoing. Initially, the patient presented with constipation as his main concern.  Labs were obtained and he had a creatinine of 3.07 and a BUN of 67. Reports that he has not been drinking much water or eating much.   Last bowel  movement one week ago. No known hematochezia or hematemesis. No nausea or vomiting. Not eating much over the past week. Has not tried anything to help bowel  movements.   No night sweats, weight loss, other B symptoms.   Normal voids, no dysuria.   In the ED, received pain medication, LR 2L, Zofran. Had CT abdomen and pelvis showing diffuse small and large bowel dilation to distal descending colon, extensive diverticulosis, luminal density concerning for colonic mass   Review Of Systems: Per HPI.  Pertinent Past Medical History: DM2 HTN  Bipolar disorder  History of prostate cancer  Remainder reviewed in history tab.   Pertinent Past Surgical History: None   Remainder reviewed in history tab.  Pertinent Social History: Tobacco use: Yes-1PPD for many years  Alcohol use: No Other Substance use: No Lives alone   Pertinent Family History:  Abdominal cancer in the brother   Remainder reviewed in history tab.   Important Outpatient Medications: Amlodipine 10 mg daily  Depakote 500 mg 24 hr tablet Hydrochlorothiazide 50 mg daily  Lisinopril 40 mg daily  Risperdal 2 mg tab  Remainder reviewed in medication history.   Objective: BP 125/65   Pulse 78   Temp 98.3 F (36.8 C)   Resp 16   Ht 5\' 8"  (1.727 m)   Wt 109.8 kg   SpO2 100%   BMI 36.80 kg/m  Exam: General: Obese male laying on side in ED bed, no acute distress Eyes: EOMI Neck: Present Cardiovascular: Regular rate and rhythm, no murmurs rubs or gallops Respiratory: Clear to auscultation bilaterally Gastrointestinal: Obese, distended stomach.  Nontender to palpation in 4 quadrants.  Bowel sounds present.  Patient said abdomen is not abnormally distended from baseline. MSK: ROM intact.  Strength appropriate for age. Neuro: No gross deficits.  No dysarthria. Psych: Appropriate mood and affect.  Labs:  CBC BMET  Recent Labs  Lab 06/11/23 1023  WBC 7.4  HGB 15.0  HCT 47.7  PLT 211   Recent Labs  Lab 06/11/23 1023  NA 134*  K 4.8  CL 100  CO2 20*  BUN 67*  CREATININE 3.07*  GLUCOSE 143*  CALCIUM 8.4*    Pertinent additional labs Lipase nml. UA  negative EKG:   Nonspecific ST changes   Imaging Studies Performed:  CT ABDOMEN PELVIS WO CONTRAST  Result Date: 06/11/2023 IMPRESSION: 1. Diffuse small and large bowel dilation to the level of the distal descending colon, where there is a lobulated intraluminal density, suspicious for colonic mass. Recommend colonoscopy for further evaluation. 2. Extensive colonic diverticulosis without evidence of acute diverticulitis. 3. Bilateral common iliac arteries measure 1.9 cm. 4. Aortic Atherosclerosis (ICD10-I70.0). Coronary artery calcifications. Assessment for potential risk factor modification, dietary therapy or pharmacologic therapy may be warranted, if clinically indicated.   DG Abdomen 1 View  Result Date: 06/11/2023  IMPRESSION: Dilated loops of small and large bowel. This has has a differential including ileus or developing obstruction. With findings would recommend further workup with a contrast CT scan of the abdomen and pelvis when clinically appropriate.  Luiz Iron, MD Psychiatry Resident, PGY-1  Alfredo Martinez, MD PGY-3, [facility]  FPTS Intern pager: [phone], text pages welcome Secure chat group [facility] Teaching Service

## 2023-06-11 NOTE — ED Notes (Signed)
Report received, assumed care of patient at this time.  

## 2023-06-11 NOTE — ED Notes (Addendum)
Patient pants soiled with urine, attempted to get patient into hospital gown patient refused. Also offered patient paper scrub bottoms, patient refused.

## 2023-06-11 NOTE — Hospital Course (Addendum)
Bradley [name] is a 72 y.o.male with a history of prostate cancer status post radiotherapy, type 2 diabetes, bipolar disorder, hypertension who was admitted to the [facility] teaching Service at [facility] for AKI and constipation. His hospital course is detailed below:  AKI  Cr of 3.07 BUN 67.  Previous Cr 1.45.  Other chronic conditions were medically managed with home medications and formulary alternatives as necessary (***)  PCP Follow-up Recommendations:

## 2023-06-11 NOTE — ED Triage Notes (Signed)
Patient arrives for eval of constipation. Last BM days ago. States is still passing gas and denies n/v. Using colace at home without improvement.

## 2023-06-12 ENCOUNTER — Encounter (HOSPITAL_COMMUNITY): Payer: Self-pay | Admitting: Student

## 2023-06-12 ENCOUNTER — Inpatient Hospital Stay (HOSPITAL_COMMUNITY): Payer: Medicare HMO

## 2023-06-12 DIAGNOSIS — R9389 Abnormal findings on diagnostic imaging of other specified body structures: Secondary | ICD-10-CM | POA: Diagnosis not present

## 2023-06-12 DIAGNOSIS — R933 Abnormal findings on diagnostic imaging of other parts of digestive tract: Secondary | ICD-10-CM

## 2023-06-12 DIAGNOSIS — K56699 Other intestinal obstruction unspecified as to partial versus complete obstruction: Secondary | ICD-10-CM | POA: Diagnosis not present

## 2023-06-12 DIAGNOSIS — K5900 Constipation, unspecified: Secondary | ICD-10-CM | POA: Diagnosis not present

## 2023-06-12 DIAGNOSIS — N179 Acute kidney failure, unspecified: Secondary | ICD-10-CM | POA: Diagnosis not present

## 2023-06-12 DIAGNOSIS — K56600 Partial intestinal obstruction, unspecified as to cause: Secondary | ICD-10-CM | POA: Diagnosis present

## 2023-06-12 HISTORY — DX: Other intestinal obstruction unspecified as to partial versus complete obstruction: K56.699

## 2023-06-12 LAB — BASIC METABOLIC PANEL
Anion gap: 13 (ref 5–15)
BUN: 57 mg/dL — ABNORMAL HIGH (ref 8–23)
CO2: 21 mmol/L — ABNORMAL LOW (ref 22–32)
Calcium: 8.4 mg/dL — ABNORMAL LOW (ref 8.9–10.3)
Chloride: 102 mmol/L (ref 98–111)
Creatinine, Ser: 1.74 mg/dL — ABNORMAL HIGH (ref 0.61–1.24)
GFR, Estimated: 42 mL/min — ABNORMAL LOW (ref >60)
Glucose, Bld: 88 mg/dL (ref 70–99)
Potassium: 5 mmol/L (ref 3.5–5.1)
Sodium: 136 mmol/L (ref 135–145)

## 2023-06-12 LAB — CBC
HCT: 44 % (ref 39.0–52.0)
Hemoglobin: 14 g/dL (ref 13.0–17.0)
MCH: 29.5 pg (ref 26.0–34.0)
MCHC: 31.8 g/dL (ref 30.0–36.0)
MCV: 92.8 fL (ref 80.0–100.0)
Platelets: 163 10*3/uL (ref 150–400)
RBC: 4.74 MIL/uL (ref 4.22–5.81)
RDW: 15.1 % (ref 11.5–15.5)
WBC: 5.8 10*3/uL (ref 4.0–10.5)
nRBC: 0 % (ref 0.0–0.2)

## 2023-06-12 LAB — HEMOGLOBIN A1C
Hgb A1c MFr Bld: 6.5 % — ABNORMAL HIGH (ref 4.8–5.6)
Mean Plasma Glucose: 139.85 mg/dL

## 2023-06-12 LAB — GLUCOSE, CAPILLARY
Glucose-Capillary: 91 mg/dL (ref 70–99)
Glucose-Capillary: 103 mg/dL — ABNORMAL HIGH (ref 70–99)
Glucose-Capillary: 88 mg/dL (ref 70–99)
Glucose-Capillary: 64 mg/dL — ABNORMAL LOW (ref 70–99)
Glucose-Capillary: 75 mg/dL (ref 70–99)

## 2023-06-12 LAB — HIV ANTIBODY (ROUTINE TESTING W REFLEX): HIV Screen 4th Generation wRfx: NONREACTIVE

## 2023-06-12 LAB — CBG MONITORING, ED
Glucose-Capillary: 115 mg/dL — ABNORMAL HIGH (ref 70–99)
Glucose-Capillary: 97 mg/dL (ref 70–99)

## 2023-06-12 LAB — SODIUM, URINE, RANDOM: Sodium, Ur: 63 mmol/L

## 2023-06-12 LAB — CREATININE, URINE, RANDOM: Creatinine, Urine: 114 mg/dL

## 2023-06-12 LAB — TSH: TSH: 0.623 u[IU]/mL (ref 0.350–4.500)

## 2023-06-12 MED ORDER — SORBITOL 70 % SOLN
960.0000 mL | TOPICAL_OIL | Freq: Once | ORAL | Status: AC
  Administered 2023-06-12: 960 mL via RECTAL
  Filled 2023-06-12: qty 240

## 2023-06-12 MED ORDER — ENOXAPARIN SODIUM 30 MG/0.3ML IJ SOSY: 30.0000 mg | PREFILLED_SYRINGE | INTRAMUSCULAR | Status: DC

## 2023-06-12 MED ORDER — DEXTROSE 50 % IV SOLN
12.5000 g | INTRAVENOUS | Status: DC
  Administered 2023-06-13: 12.5 g via INTRAVENOUS
  Filled 2023-06-12: qty 50

## 2023-06-12 MED ORDER — ENOXAPARIN SODIUM 60 MG/0.6ML IJ SOSY: 0.5000 mg/kg | PREFILLED_SYRINGE | INTRAMUSCULAR | Status: DC

## 2023-06-12 MED ORDER — RISPERIDONE 1 MG PO TABS
2.0000 mg | ORAL_TABLET | Freq: Every day | ORAL | Status: DC
  Administered 2023-06-12 – 2023-06-13 (×2): 2 mg via ORAL
  Filled 2023-06-12 (×2): qty 1

## 2023-06-12 MED ORDER — LACTATED RINGERS IV SOLN
INTRAVENOUS | Status: DC
Start: 2023-06-12 — End: 2023-06-12

## 2023-06-12 MED ORDER — PRAVASTATIN SODIUM 40 MG PO TABS
40.0000 mg | ORAL_TABLET | Freq: Every day | ORAL | Status: DC
  Administered 2023-06-12 – 2023-06-24 (×10): 40 mg via ORAL
  Filled 2023-06-12 (×3): qty 1
  Filled 2023-06-12: qty 2
  Filled 2023-06-12 (×7): qty 1
  Filled 2023-06-12: qty 2

## 2023-06-12 MED ORDER — ENOXAPARIN SODIUM 40 MG/0.4ML IJ SOSY
40.0000 mg | PREFILLED_SYRINGE | INTRAMUSCULAR | Status: DC
  Administered 2023-06-12 – 2023-06-13 (×2): 40 mg via SUBCUTANEOUS
  Filled 2023-06-12 (×2): qty 0.4

## 2023-06-12 MED ORDER — SORBITOL 70 % SOLN: 960.0000 mL | TOPICAL_OIL | Freq: Once | ORAL | Status: DC

## 2023-06-12 NOTE — Assessment & Plan Note (Signed)
Patient has evidence of a lobulated intraluminal density that is concerning for colonic mass.  Denies B symptoms. History of colon adenomas.  Endoscopy 2020: One 4 mm polyp removed found to be tubular adenoma, no high-grade dysplasia or malignancy.  Notably, diagnosed with T1c adenocarcinoma of prostate in 2021, status post radiotherapy. Will need to have further GI involvement, likely including follow-up colonoscopy. - GI consult tomorrow a.m.

## 2023-06-12 NOTE — Assessment & Plan Note (Addendum)
Creatinine on admission 3.07 with a BUN of 67 improved to 1.74 and 57 with IVF.  Likely prerenal etiology.  UA unremarkable so less concern for obstructive etiology. TSH, A1c WNL. --continue IVF - Strict I/Os - Hold home lisinopril + HCTZ - PT/OT eval/treat

## 2023-06-12 NOTE — Progress Notes (Signed)
Pt was able to tolerate about 2/3 of enema. Had a very small hard stool, otherwise, just enema contents came out in stool.

## 2023-06-12 NOTE — Assessment & Plan Note (Addendum)
Patient initially presented for constipation, CT abdomen pelvis findings as above. Has not been passing flatus.  - 2 SMOG enemas per GI

## 2023-06-12 NOTE — ED Notes (Signed)
Encouraged patient to urinate, patient reports unable to do so at this time.

## 2023-06-12 NOTE — Progress Notes (Signed)
OT Cancellation Note  Patient Details Name: Bradley [name] MRN: [id number] DOB: 1950-12-01   Cancelled Treatment:    Reason Eval/Treat Not Completed: OT screened, no needs identified, will sign off Evaluated by PT and pt does not require any OT services at this time. Thank you for the referral.   Limmie Patricia, OTR/L,CBIS  Supplemental OT - [facility] and [facility] Secure Chat Preferred   06/12/2023, 11:28 AM

## 2023-06-12 NOTE — Progress Notes (Signed)
   06/12/23 1554  TOC Brief Assessment  Insurance and Status Reviewed  Patient has primary care physician Yes  Home environment has been reviewed yes  Prior level of function: independent  Prior/Current Home Services No current home services  Social Determinants of Health Reivew SDOH reviewed no interventions necessary  Readmission risk has been reviewed Yes  Transition of care needs no transition of care needs at this time

## 2023-06-12 NOTE — Evaluation (Signed)
Physical Therapy Brief Evaluation and Discharge Note Patient Details Name: Bradley [name] MRN: [id number] DOB: September 15, 1950 Today's Date: 06/12/2023   History of Present Illness  72 y.o. male presenting to ED 10/22 with constipation of 1 week and AKI ongoing. CT reveals Patient has evidence of a lobulated intraluminal density that is concerning for colonic mass admitted for treatment of Possible partial small bowel obstruction with history of mid sigmoid stenosis PMH: prostate cancer status post radiotherapy (2021)  Clinical Impression  Patient evaluated by Physical Therapy with no further acute PT needs identified. All education has been completed and the patient has no further questions. Pt has no Physical Therapy or equipment needs. PT is signing off. Thank you for this referral.        PT Assessment Patient does not need any further PT services  Assistance Needed at Discharge  PRN    Equipment Recommendations None recommended by PT     Precautions/Restrictions Precautions Precautions: None Restrictions Weight Bearing Restrictions: No        Mobility  Bed Mobility   Supine/Sidelying to sit: Modified independent (Device/Increased time)   General bed mobility comments: minor use of rails to come to EoB  Transfers Overall transfer level: Independent                      Ambulation/Gait Ambulation/Gait assistance: Independent Gait Distance (Feet): 150 Feet Assistive device: None Gait Pattern/deviations: WFL(Within Functional Limits), Step-through pattern Gait Speed: Pace WFL General Gait Details: strong, steady gait       Balance Overall balance assessment: Independent                        Pertinent Vitals/Pain PT - Brief Vital Signs All Vital Signs Stable: Other (comment) (2L O2 via [place] on entry 90%O2, 96%O2 with ambulation, HR 66 with ambulation) Pain Assessment Pain Assessment: No/denies pain     Home Living Family/patient expects  to be discharged to:: Private residence Living Arrangements: Alone Available Help at Discharge: Family;Available PRN/intermittently Home Environment: Level entry   Home Equipment: None        Prior Function Level of Independence: Independent      UE/LE Assessment   UE ROM/Strength/Tone/Coordination: WFL    LE ROM/Strength/Tone/Coordination: [facility]      Communication   Communication Communication: No apparent difficulties     Cognition Overall Cognitive Status: Appears within functional limits for tasks assessed/performed       General Comments General comments (skin integrity, edema, etc.): abdomen distended        Assessment/Plan       PT Visit Diagnosis Other abnormalities of gait and mobility (R26.89)    No Skilled PT All education completed;Patient at baseline level of functioning;Patient is modified independent with all activity/mobility    AMPAC 6 Clicks Help needed turning from your back to your side while in a flat bed without using bedrails?: None Help needed moving from lying on your back to sitting on the side of a flat bed without using bedrails?: None Help needed moving to and from a bed to a chair (including a wheelchair)?: None Help needed standing up from a chair using your arms (e.g., wheelchair or bedside chair)?: None Help needed to walk in hospital room?: None Help needed climbing 3-5 steps with a railing? : A Little 6 Click Score: 23      End of Session Equipment Utilized During Treatment: Gait belt;Oxygen Activity Tolerance: Patient tolerated treatment well Patient left:  in chair;with call bell/phone within reach Nurse Communication: Mobility status PT Visit Diagnosis: Other abnormalities of gait and mobility (R26.89)     Time: 4098-1191 PT Time Calculation (min) (ACUTE ONLY): 20 min  Charges:   PT Evaluation $PT Eval Low Complexity: 1 Low      Aeric Burnham B. Beverely Risen PT, DPT Acute Rehabilitation Services Please use secure  chat or  Call Office [phone]   Elon Alas Fleet  06/12/2023, 1:23 PM

## 2023-06-12 NOTE — Assessment & Plan Note (Signed)
Patient initially presented for constipation, CT abdomen pelvis was obtained - passing flatus, last yesterday. History of radiotherapy, possibly contributing to adhesions?  - Starting [employer]

## 2023-06-12 NOTE — Consult Note (Signed)
Consultation  Referring Provider: Dr. Manson Passey     Primary Care Physician:  [facility] Primary Gastroenterologist: Dr. Marina Goodell        Reason for Consultation: Abnormal CT of the abdomen            HPI:   Bradley [name] is a 72 y.o. male with a past medical history as listed below including diabetes and history of adenomatous polyps, who presented to the ED on 06/11/2023 with constipation and AKI.    At time of presentation patient described that he had not been eating or drinking well since constipation started a week ago.    Today, patient explains that about a week ago he noticed that he was unable to pass gas or have a stool, he has still not passed gas or had a stool over the past week.  Tells me along with this has noticed a decrease in appetite but was actually eating his normal food just in smaller amounts prior to admission.  He has been n.p.o. overnight.  Tells me he has no nausea or vomiting.  Generalized abdominal discomfort rated as a 5-6/10.  Associated symptoms include a 10 pound weight loss over the past week per patient.    Denies fever, chills, nausea or vomiting.  ER course: CT with lobulated intraluminal density, suspicious for colonic mass with diffuse small and large bowel dilation to distal descending colon  GI history: 01/28/2019 colonoscopy with Dr. 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      Past Surgical History:  Procedure Laterality Date   COLONOSCOPY  2009, 2015   ENTEROSCOPY N/A 07/28/2015   Procedure: ENTEROSCOPY;  Surgeon: Meryl Dare, MD;  Location: [facility] ENDOSCOPY;  Service: Endoscopy;  Laterality: N/A;    Family History  Problem Relation Age of Onset   Cancer Brother        abdominal CA   Colon cancer Neg Hx    Esophageal cancer Neg Hx    Rectal cancer Neg Hx    Stomach cancer Neg Hx      Social History   Tobacco Use   Smoking status: Every Day    Current packs/day: 1.00    Average packs/day: 1 pack/day for 20.0 years (20.0 ttl pk-yrs)    Types: Cigarettes   Smokeless tobacco: Never  Vaping Use   Vaping status: Never Used  Substance Use Topics   Alcohol use: No   Drug use: No    Prior to Admission medications   Medication Sig Start Date End Date Taking? Authorizing Provider  amLODipine (NORVASC) 10 MG tablet Take 10 mg by mouth daily.   Yes [provider]  divalproex (DEPAKOTE ER) 500 MG 24 hr tablet Take 500 mg by mouth 2 (two) times daily.   Yes [provider]  hydrochlorothiazide (HYDRODIURIL)  50 MG tablet Take 50 mg by mouth daily.   Yes [provider]  lisinopril (PRINIVIL,ZESTRIL) 40 MG tablet Take 40 mg by mouth daily.   Yes [provider]  nebivolol (BYSTOLIC) 10 MG tablet Take 1 tablet (10 mg total) by mouth daily. 02/25/23  Yes Meriam Sprague, MD  pravastatin (PRAVACHOL) 40 MG tablet Take 1 tablet (40 mg total) by mouth every evening. 04/09/23 07/08/23 Yes Chandrasekhar, Mahesh A, MD  risperiDONE (RISPERDAL) 2 MG tablet Take 2 mg by mouth at bedtime. 05/21/23  Yes [provider]    Current Facility-Administered Medications  Medication Dose Route Frequency Provider Last Rate Last Admin   0.9 %  sodium chloride infusion   Intravenous Continuous Alfredo Martinez, MD 100 mL/hr at 06/12/23 0607 New Bag  at 06/12/23 6644   acetaminophen (TYLENOL) tablet 650 mg  650 mg Oral Q6H PRN Alfredo Martinez, MD   650 mg at 06/12/23 0347   amLODipine (NORVASC) tablet 10 mg  10 mg Oral Daily Alfredo Martinez, MD   10 mg at 06/12/23 0855   divalproex (DEPAKOTE ER) 24 hr tablet 500 mg  500 mg Oral BID Alfredo Martinez, MD   500 mg at 06/11/23 2116   enoxaparin (LOVENOX) injection 40 mg  40 mg Subcutaneous Q24H Utomwen, Adesuwa, RPH       insulin aspart (novoLOG) injection 0-9 Units  0-9 Units Subcutaneous Q4H Maxwell, Allee, MD       polyethylene glycol (MIRALAX / GLYCOLAX) packet 17 g  17 g Oral Daily Alfredo Martinez, MD   17 g at 06/12/23 0855   pravastatin (PRAVACHOL) tablet 10 mg  10 mg Oral q1800 Alfredo Martinez, MD       risperiDONE (RISPERDAL) tablet 0.5 mg  0.5 mg Oral QHS Maxwell, Allee, MD   0.5 mg at 06/11/23 2112    Allergies as of 06/11/2023   (No Known Allergies)     Review of Systems:    Constitutional: No fever or chills Skin: No rash Cardiovascular: No chest pain Respiratory: No SOB  Gastrointestinal: See HPI and otherwise negative Genitourinary: No dysuria Neurological: No headache, dizziness or syncope Musculoskeletal: No new muscle or joint pain Hematologic: No bleeding Psychiatric: No history of depression or anxiety    Physical Exam:  Vital signs in last 24 hours: Temp:  [97.7 F (36.5 C)-99.2 F (37.3 C)] 98.1 F (36.7 C) (10/23 0806) Pulse Rate:  [51-86] 70 (10/23 0806) Resp:  [14-33] 17 (10/23 0806) BP: (105-143)/(47-78) 116/77 (10/23 0806) SpO2:  [86 %-100 %] 92 % (10/23 0806) Weight:  [109.8 kg] 109.8 kg (10/22 1021) Last BM Date : 06/04/23 (per pt repory) General:   Pleasant AA male appears to be in NAD, Well developed, Well nourished, alert and cooperative Head:  Normocephalic and atraumatic. Eyes:   PEERL, EOMI. No icterus. Conjunctiva pink. Ears:  Normal auditory acuity. Neck:  Supple Throat: Oral cavity and pharynx without inflammation, swelling or lesion. Teeth  in good condition. Lungs: Respirations even and unlabored. Lungs clear to auscultation bilaterally.   No wheezes, crackles, or rhonchi.  Heart: Normal S1, S2. No MRG. Regular rate and rhythm. No peripheral edema, cyanosis or pallor.  Abdomen: Tense, marked distention, mild to moderate TTP over entirety of abdomen, decreased/absent bowel sounds, hypertympanic. No rebound or guarding. No appreciable masses or hepatomegaly. Rectal:  Not performed.  Msk:  Symmetrical without gross deformities. Peripheral pulses intact.  Extremities:  Without edema, no deformity or joint abnormality. Normal ROM, normal sensation. Neurologic:  Alert  and  oriented x4;  grossly normal neurologically.  Skin:   Dry and intact without significant lesions or rashes. Psychiatric: Demonstrates good judgement and reason without abnormal affect or behaviors.   LAB RESULTS: Recent Labs    06/11/23 1023 06/12/23 0347  WBC 7.4 5.8  HGB 15.0 14.0  HCT 47.7 44.0  PLT 211 163   BMET Recent Labs    06/11/23 1023 06/12/23 0347  NA 134* 136  K 4.8 5.0  CL 100 102  CO2 20* 21*  GLUCOSE 143* 88  BUN 67* 57*  CREATININE 3.07* 1.74*  CALCIUM 8.4* 8.4*   LFT Recent Labs    06/11/23 1023  PROT 8.2*  ALBUMIN 3.7  AST 12*  ALT 19  ALKPHOS 38  BILITOT 0.8   STUDIES: DG Chest 1 View  Result Date: 06/12/2023 CLINICAL DATA:  72 year old male with hypoxia. Obstructing large bowel mass suspected on recent CT Abdomen and Pelvis . EXAM: CHEST  1 VIEW COMPARISON:  CT Abdomen and Pelvis yesterday, and earlier. FINDINGS: Portable AP semi upright view at 0501 hours. Somewhat low lung volumes stable from the CT yesterday. Heart size and mediastinal contours remain within normal limits. No pneumothorax or pneumoperitoneum identified. Chronic blunting of the left lateral costophrenic angle. No pulmonary edema or acute pulmonary opacity. No acute osseous abnormality identified. IMPRESSION: Low lung volumes, otherwise no acute  cardiopulmonary abnormality. Electronically Signed   By: Odessa Fleming M.D.   On: 06/12/2023 07:25   CT ABDOMEN PELVIS WO CONTRAST  Result Date: 06/11/2023 CLINICAL DATA:  Several day history of diffuse abdominal pain and constipation. * Tracking Code: BO * EXAM: CT ABDOMEN AND PELVIS WITHOUT CONTRAST TECHNIQUE: Multidetector CT imaging of the abdomen and pelvis was performed following the standard protocol without IV contrast. RADIATION DOSE REDUCTION: This exam was performed according to the departmental dose-optimization program which includes automated exposure control, adjustment of the mA and/or kV according to patient size and/or use of iterative reconstruction technique. COMPARISON:  Same-day abdominal radiograph, CT abdomen and pelvis dated 12/27/2016, MR abdomen dated 03/28/2021 FINDINGS: Lower chest: Left lower lobe nodule measures 5 x 2 mm (5:13), unchanged. No specific follow-up imaging recommended. No pleural effusion or pneumothorax demonstrated. Left atrial enlargement. Coronary artery calcifications. Hepatobiliary: No focal hepatic lesions. No intra or extrahepatic biliary ductal dilation. Normal gallbladder. Pancreas: No focal lesions or main ductal dilation. Spleen: Normal in size without focal abnormality. Adrenals/Urinary Tract: No adrenal nodules. No calculi or hydronephrosis. Multifocal bilateral exophytic lesions, many of which appear mildly hyperattenuating, correspond to simple and minimally complex cysts on prior MRI. No specific follow-up imaging recommended. No focal bladder wall thickening. Stomach/Bowel: Normal appearance of the stomach. Diffuse small and large bowel dilation to the level of the distal descending colon, where there is a lobulated intraluminal density (3:47). Extensive colonic diverticulosis. Normal appendix. Vascular/Lymphatic: Aortic atherosclerosis. Bilateral common iliac arteries measure 1.9 cm. No enlarged abdominal or pelvic lymph nodes. Reproductive: Enlargement  of the prostate with median lobe hypertrophy. Prostate fiducials in-situ. Other: No free fluid, fluid collection, or free air. Musculoskeletal: No acute or abnormal lytic or blastic osseous lesions. IMPRESSION: 1. Diffuse small and large bowel dilation to the level of the distal descending colon, where there is a lobulated intraluminal density, suspicious for colonic mass. Recommend colonoscopy for further evaluation. 2. Extensive colonic diverticulosis without evidence of acute diverticulitis. 3. Bilateral common iliac arteries measure 1.9 cm. 4. Aortic Atherosclerosis (ICD10-I70.0). Coronary artery calcifications. Assessment for potential risk factor modification, dietary therapy or  pharmacologic therapy may be warranted, if clinically indicated. Electronically Signed   By: Agustin Cree M.D.   On: 06/11/2023 18:23   DG Abdomen 1 View  Result Date: 06/11/2023 CLINICAL DATA:  Constipation for 8 days. EXAM: ABDOMEN - 1 VIEW COMPARISON:  X-ray 08/30/2022 FINDINGS: Transverse colon is mildly dilated up to 8.7 cm. There also some dilated loops of small bowel throughout the midabdomen measuring up to 5.3 cm. Minimal air in the rectum. Mild stool. No obvious free air but these are supine radiographs. Degenerative changes of the spine and pelvis. Treatment changes in the area of the prostate. IMPRESSION: Dilated loops of small and large bowel. This has has a differential including ileus or developing obstruction. With findings would recommend further workup with a contrast CT scan of the abdomen and pelvis when clinically appropriate Electronically Signed   By: Karen Kays M.D.   On: 06/11/2023 13:31     Impression / Plan:   Impression: 1.  Abnormal CT of the abdomen: Showing small and large bowel dilation to the level of the distal descending colon with question of colonic mass, last colonoscopy in 2020 with some stricturing around this site due to diverticula; question colon mass versus more likely tight  diverticular stricture with stool ball 2.  AKI: Creatinine on admission 3.07 with a BUN of 67, thought likely prerenal, improvement today with a creatinine of 1.74 and a BUN of 57 3.  Constipation: Likely from obstruction as above  Plan: 1.  Patient denies any nausea or vomiting.  We will go ahead and give 2 smog enemas and see how he does.  2.  Goal would be to possibly achieve a flex sigmoidoscopy tomorrow, pending course over the day.  Will go ahead and set him up for this with Dr. Adela Lank.  Did discuss risks, benefits, limitations and alternatives and the patient agrees to proceed. 3.  Keep NPO 4.  Will also consult surgery  Thank you for your kind consultation, we will continue to follow.  Violet Baldy Meyer Dockery  06/12/2023, 10:15 AM

## 2023-06-12 NOTE — ED Notes (Signed)
ED TO INPATIENT HANDOFF REPORT  ED Nurse Name and Phone #: Pearletha Forge RN [phone]  S Name/Age/Gender Bradley [name] 72 y.o. male Room/Bed: 038C/038C  Code Status   Code Status: Full Code  Home/SNF/Other Home Patient oriented to: self, place, time, and situation Is this baseline? Yes   Triage Complete: Triage complete  Chief Complaint AKI (acute kidney injury) [facility]) [N17.9]  Triage Note Patient arrives for eval of constipation. Last BM days ago. States is still passing gas and denies n/v. Using colace at home without improvement.    Allergies No Known Allergies  Level of Care/Admitting Diagnosis ED Disposition     ED Disposition  Admit   Condition  --   Comment  Hospital Area: [facility] [[id number]]  Level of Care: Med-Surg [16]  May admit patient to [facility] or [facility] if equivalent level of care is available:: Yes  Covid Evaluation: Asymptomatic - no recent exposure (last 10 days) testing not required  Diagnosis: AKI (acute kidney injury) [facility]) [[id number]]  Admitting Physician: Alfredo Martinez [[id number]]  Attending Physician: Westley Chandler [[id number]]  Certification:: I certify this patient will need inpatient services for at least 2 midnights  Expected Medical Readiness: 06/14/2023          B Medical/Surgery History Past Medical History:  Diagnosis Date   Arthritis    Bipolar 2 disorder (HCC) 2005   Also carries diagnosis of schizophrenia.   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     Past Surgical History:  Procedure Laterality Date   COLONOSCOPY  2009, 2015   ENTEROSCOPY N/A 07/28/2015   Procedure: ENTEROSCOPY;  Surgeon: Meryl Dare, MD;  Location: [facility] ENDOSCOPY;  Service: Endoscopy;  Laterality: N/A;      A IV Location/Drains/Wounds Patient Lines/Drains/Airways Status     Active Line/Drains/Airways     Name Placement date Placement time Site Days   Peripheral IV 06/11/23 20 G 1" Right Antecubital 06/11/23  1231  Antecubital  1   Peripheral IV 06/11/23 20 G 1" Posterior;Right Hand 06/11/23  2125  Hand  1            Intake/Output Last 24 hours  Intake/Output Summary (Last 24 hours) at 06/12/2023 0231 Last data filed at 06/11/2023 1702 Gross per 24 hour  Intake 2000 ml  Output --  Net 2000 ml    Labs/Imaging Results for orders placed or performed during the hospital encounter of 06/11/23 (from the past 48 hour(s))  Urinalysis, Routine w reflex microscopic -Urine, Clean Catch     Status: None   Collection Time: 06/11/23 10:22 AM  Result Value Ref Range   Color, Urine YELLOW YELLOW   APPearance CLEAR CLEAR   Specific Gravity, Urine 1.015 1.005 - 1.030   pH 5.0 5.0 - 8.0   Glucose, UA NEGATIVE NEGATIVE mg/dL   Hgb urine dipstick NEGATIVE NEGATIVE   Bilirubin Urine NEGATIVE NEGATIVE   Ketones, ur NEGATIVE NEGATIVE mg/dL   Protein, ur NEGATIVE NEGATIVE mg/dL   Nitrite NEGATIVE NEGATIVE   Leukocytes,Ua NEGATIVE NEGATIVE    Comment: Performed at [facility], [address], [place], [place] [address]  Lipase, blood     Status: None   Collection Time: 06/11/23 10:23 AM  Result Value Ref  Range   Lipase 24 11 - 51 U/L    Comment: Performed at [facility], [address], [place], [place] [address]  Comprehensive metabolic panel     Status: Abnormal   Collection Time: 06/11/23 10:23 AM  Result Value Ref Range   Sodium 134 (L) 135 - 145 mmol/L   Potassium 4.8 3.5 - 5.1 mmol/L   Chloride 100 98 - 111 mmol/L   CO2 20 (L) 22 - 32 mmol/L   Glucose, Bld 143 (H) 70 - 99 mg/dL    Comment: Glucose reference range applies only to samples taken after fasting for at least 8 hours.   BUN 67 (H) 8 - 23 mg/dL   Creatinine, Ser 4.69 (H) 0.61 - 1.24 mg/dL   Calcium 8.4  (L) 8.9 - 10.3 mg/dL   Total Protein 8.2 (H) 6.5 - 8.1 g/dL   Albumin 3.7 3.5 - 5.0 g/dL   AST 12 (L) 15 - 41 U/L   ALT 19 0 - 44 U/L   Alkaline Phosphatase 38 38 - 126 U/L   Total Bilirubin 0.8 0.3 - 1.2 mg/dL   GFR, Estimated 21 (L) >60 mL/min    Comment: (NOTE) Calculated using the CKD-EPI Creatinine Equation (2021)    Anion gap 14 5 - 15    Comment: Performed at [facility], [address], [place], [place] [address]  CBC     Status: None   Collection Time: 06/11/23 10:23 AM  Result Value Ref Range   WBC 7.4 4.0 - 10.5 K/uL   RBC 5.15 4.22 - 5.81 MIL/uL   Hemoglobin 15.0 13.0 - 17.0 g/dL   HCT 84.1 32.4 - 40.1 %   MCV 92.6 80.0 - 100.0 fL   MCH 29.1 26.0 - 34.0 pg   MCHC 31.4 30.0 - 36.0 g/dL   RDW 02.7 25.3 - 66.4 %   Platelets 211 150 - 400 K/uL   nRBC 0.0 0.0 - 0.2 %    Comment: Performed at [facility], [address], [place], [place] [address]  CBG monitoring, ED     Status: None   Collection Time: 06/12/23 12:49 AM  Result Value Ref Range   Glucose-Capillary 97 70 - 99 mg/dL    Comment: Glucose reference range applies only to samples taken after fasting for at least 8 hours.   Comment 1 Notify RN    Comment 2 Document in Chart   Sodium, urine, random     Status: None   Collection Time: 06/12/23  1:28 AM  Result Value Ref Range   Sodium, Ur 63 mmol/L    Comment: Performed at [facility], [address], [place], [place] [address]  Creatinine, urine, random     Status: None   Collection Time: 06/12/23  1:28 AM  Result Value Ref Range   Creatinine, Urine 114 mg/dL    Comment: Performed at [facility], [address] [place], [place] [address]   CT ABDOMEN PELVIS WO CONTRAST  Result Date: 06/11/2023 CLINICAL DATA:  Several day history of diffuse abdominal pain and constipation. * Tracking Code: BO * EXAM: CT ABDOMEN AND PELVIS WITHOUT CONTRAST TECHNIQUE: Multidetector CT imaging of the abdomen and pelvis was performed following the  standard protocol without IV contrast. RADIATION DOSE REDUCTION: This exam was performed according to the departmental dose-optimization program which includes automated exposure control, adjustment of the mA and/or kV according to patient size and/or use of iterative reconstruction technique.  COMPARISON:  Same-day abdominal radiograph, CT abdomen and pelvis dated 12/27/2016, MR abdomen dated 03/28/2021 FINDINGS: Lower chest: Left lower lobe nodule measures 5 x 2 mm (5:13), unchanged. No specific follow-up imaging recommended. No pleural effusion or pneumothorax demonstrated. Left atrial enlargement. Coronary artery calcifications. Hepatobiliary: No focal hepatic lesions. No intra or extrahepatic biliary ductal dilation. Normal gallbladder. Pancreas: No focal lesions or main ductal dilation. Spleen: Normal in size without focal abnormality. Adrenals/Urinary Tract: No adrenal nodules. No calculi or hydronephrosis. Multifocal bilateral exophytic lesions, many of which appear mildly hyperattenuating, correspond to simple and minimally complex cysts on prior MRI. No specific follow-up imaging recommended. No focal bladder wall thickening. Stomach/Bowel: Normal appearance of the stomach. Diffuse small and large bowel dilation to the level of the distal descending colon, where there is a lobulated intraluminal density (3:47). Extensive colonic diverticulosis. Normal appendix. Vascular/Lymphatic: Aortic atherosclerosis. Bilateral common iliac arteries measure 1.9 cm. No enlarged abdominal or pelvic lymph nodes. Reproductive: Enlargement of the prostate with median lobe hypertrophy. Prostate fiducials in-situ. Other: No free fluid, fluid collection, or free air. Musculoskeletal: No acute or abnormal lytic or blastic osseous lesions. IMPRESSION: 1. Diffuse small and large bowel dilation to the level of the distal descending colon, where there is a lobulated intraluminal density, suspicious for colonic mass. Recommend  colonoscopy for further evaluation. 2. Extensive colonic diverticulosis without evidence of acute diverticulitis. 3. Bilateral common iliac arteries measure 1.9 cm. 4. Aortic Atherosclerosis (ICD10-I70.0). Coronary artery calcifications. Assessment for potential risk factor modification, dietary therapy or pharmacologic therapy may be warranted, if clinically indicated. Electronically Signed   By: Agustin Cree M.D.   On: 06/11/2023 18:23   DG Abdomen 1 View  Result Date: 06/11/2023 CLINICAL DATA:  Constipation for 8 days. EXAM: ABDOMEN - 1 VIEW COMPARISON:  X-ray 08/30/2022 FINDINGS: Transverse colon is mildly dilated up to 8.7 cm. There also some dilated loops of small bowel throughout the midabdomen measuring up to 5.3 cm. Minimal air in the rectum. Mild stool. No obvious free air but these are supine radiographs. Degenerative changes of the spine and pelvis. Treatment changes in the area of the prostate. IMPRESSION: Dilated loops of small and large bowel. This has has a differential including ileus or developing obstruction. With findings would recommend further workup with a contrast CT scan of the abdomen and pelvis when clinically appropriate Electronically Signed   By: Karen Kays M.D.   On: 06/11/2023 13:31    Pending Labs Unresulted Labs (From admission, onward)     Start     Ordered   06/12/23 0500  Basic metabolic panel  Daily,   R      06/11/23 2010   06/12/23 0500  CBC  Daily,   R      06/11/23 2010   06/12/23 0500  TSH  Tomorrow morning,   R        06/11/23 2010   06/12/23 0500  Hemoglobin A1c  Tomorrow morning,   R        06/11/23 2010   06/11/23 2006  HIV Antibody (routine testing w rflx)  (HIV Antibody (Routine testing w reflex) panel)  Once,   R        06/11/23 2009            Vitals/Pain Today's Vitals   06/11/23 2330 06/12/23 0051 06/12/23 0053 06/12/23 0100  BP: (!) 108/54  114/71 110/66  Pulse: 75  74 (!) 51  Resp: (!) 21  19   Temp:  97.7 F (36.5 C)    TempSrc:   Oral   SpO2: (!) 87%  91% 100%  Weight:      Height:      PainSc:  0-No pain 0-No pain     Isolation Precautions No active isolations  Medications Medications  enoxaparin (LOVENOX) injection 30 mg (has no administration in time range)  acetaminophen (TYLENOL) tablet 650 mg (has no administration in time range)  polyethylene glycol (MIRALAX / GLYCOLAX) packet 17 g (17 g Oral Given 06/11/23 2111)  divalproex (DEPAKOTE ER) 24 hr tablet 500 mg (500 mg Oral Given 06/11/23 2116)  amLODipine (NORVASC) tablet 10 mg (has no administration in time range)  pravastatin (PRAVACHOL) tablet 10 mg (has no administration in time range)  risperiDONE (RISPERDAL) tablet 0.5 mg (0.5 mg Oral Given 06/11/23 2112)  0.9 %  sodium chloride infusion ( Intravenous New Bag/Given 06/11/23 2111)  insulin aspart (novoLOG) injection 0-9 Units ( Subcutaneous Not Given 06/12/23 0054)  lactated ringers bolus 1,000 mL (0 mLs Intravenous Stopped 06/11/23 1345)  HYDROcodone-acetaminophen (NORCO/VICODIN) 5-325 MG per tablet 1 tablet (1 tablet Oral Given 06/11/23 1239)  ondansetron (ZOFRAN) injection 4 mg (4 mg Intravenous Given 06/11/23 1237)  lactated ringers bolus 1,000 mL (0 mLs Intravenous Stopped 06/11/23 1702)    Mobility walks     Focused Assessments Patient reports not having BM x 8 days, is able to pass gas. Bowel sounds active.  Patient A & O x 4, ambulates without assistance.   R Recommendations: See Admitting Provider Note  Report given to:   Additional Notes:

## 2023-06-12 NOTE — Assessment & Plan Note (Addendum)
Patient has evidence of a lobulated intraluminal density that is concerning for colonic mass.  Denies B symptoms. History of colon adenomas.  Endoscopy 2020: One 4 mm polyp removed found to be tubular adenoma, no high-grade dysplasia or malignancy.  Notably, diagnosed with T1c adenocarcinoma of prostate in 2021, status post radiotherapy. GI consulted, concern for colon mass vs obstruction from tight diverticular stricture with stool ball. - flex sigmoidoscopy tomorrow (10/24)

## 2023-06-12 NOTE — Consult Note (Signed)
Bradley [name] 07-11-1951  [id number].    Requesting MD: Adela Lank, MD Chief Complaint/Reason for Consult: large bowel obstruction  HPI:  Bradley [name] is a 72 y/o M who was admitted to the hospital yesterday due to obstipation and AKI. He has a known history of sigmoid stricture secondary to diverticulosis, and is followed by Dr. Marina Goodell at [facility]. His last colonoscopy was in 2020. States that over the last week he has had worsening abdominal distension. He has not had a BM or passed any gas per rectum for 1 week. Denies nausea or vomiting, but has had poor appetite. CT scan was done in the ED showing diffuse small and large bowel dilation to the point of the distal descending colon where there is a lobulated intramural density suspicious for colonic mass. Patient was admitted to the medical service. GI has seen and plans for flexible sigmoidoscopy tomorrow 10/24. General surgery asked to see.  Abdominal surgeries: none  ROS: Review of Systems  All other systems reviewed and are negative.   Family History  Problem Relation Age of Onset   Cancer Brother        abdominal CA   Colon cancer Neg Hx    Esophageal cancer Neg Hx    Rectal cancer Neg Hx    Stomach cancer Neg Hx     Past Medical History:  Diagnosis Date   Arthritis    Bipolar 2 disorder (HCC) 2005   Also carries diagnosis of schizophrenia.   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      Past Surgical History:  Procedure Laterality Date   COLONOSCOPY  2009, 2015   ENTEROSCOPY N/A 07/28/2015   Procedure: ENTEROSCOPY;  Surgeon: Meryl Dare, MD;  Location: [facility];  Service: Endoscopy;  Laterality: N/A;    Social History:  reports that he has been smoking  cigarettes. He has a 20 pack-year smoking history. He has never used smokeless tobacco. He reports that he does not drink alcohol and does not use drugs.  Allergies: No Known Allergies  Medications Prior to Admission  Medication Sig Dispense Refill   amLODipine (NORVASC) 10 MG tablet Take 10 mg by mouth daily.     divalproex (DEPAKOTE ER) 500 MG 24 hr tablet Take 500 mg by mouth 2 (two) times daily.     hydrochlorothiazide (HYDRODIURIL) 50 MG tablet Take 50 mg by mouth daily.     lisinopril (PRINIVIL,ZESTRIL) 40 MG tablet Take 40 mg by mouth daily.     nebivolol (BYSTOLIC) 10 MG tablet Take 1 tablet (10 mg total) by mouth daily. 90 tablet 2   pravastatin (PRAVACHOL) 40 MG tablet Take 1 tablet (40 mg total) by mouth every evening. 90 tablet 3   risperiDONE (RISPERDAL) 2 MG tablet Take 2 mg by mouth at bedtime.       Physical Exam: Blood pressure 116/77, pulse 70, temperature 98.1 F (36.7 C), temperature source Oral, resp. rate 17, height 5\' 8"  (1.727 m), weight 109.8 kg, SpO2 92%. General: Pleasant male laying on hospital bed, appears stated age, NAD. HEENT: head -normocephalic, atraumatic; Eyes: PERRLA, no conjunctival injection Neck- Trachea is midline  CV- RRR, normal S1/S2, no M/R/G, no lower extremity edema  Pulm- breathing is non-labored on nasal  cannula  Abd- soft, NT, mild to moderate distention, appropriate bowel sounds in 4 quadrants, no masses, hernias, or organomegaly. GU- deferred  MSK- UE/LE symmetrical, no cyanosis, clubbing, or edema. Neuro- CN II-XII grossly in tact, no paresthesias. Psych- Alert and Oriented x3 with appropriate affect Skin: warm and dry, no rashes or lesions   Results for orders placed or performed during the hospital encounter of 06/11/23 (from the past 48 hour(s))  Urinalysis, Routine w reflex microscopic -Urine, Clean Catch     Status: None   Collection Time: 06/11/23 10:22 AM  Result Value Ref Range   Color, Urine YELLOW YELLOW   APPearance  CLEAR CLEAR   Specific Gravity, Urine 1.015 1.005 - 1.030   pH 5.0 5.0 - 8.0   Glucose, UA NEGATIVE NEGATIVE mg/dL   Hgb urine dipstick NEGATIVE NEGATIVE   Bilirubin Urine NEGATIVE NEGATIVE   Ketones, ur NEGATIVE NEGATIVE mg/dL   Protein, ur NEGATIVE NEGATIVE mg/dL   Nitrite NEGATIVE NEGATIVE   Leukocytes,Ua NEGATIVE NEGATIVE    Comment: Performed at [facility], [address] [place], [place] [address]  Lipase, blood     Status: None   Collection Time: 06/11/23 10:23 AM  Result Value Ref Range   Lipase 24 11 - 51 U/L    Comment: Performed at [facility], [address], [place], [place] [address]  Comprehensive metabolic panel     Status: Abnormal   Collection Time: 06/11/23 10:23 AM  Result Value Ref Range   Sodium 134 (L) 135 - 145 mmol/L   Potassium 4.8 3.5 - 5.1 mmol/L   Chloride 100 98 - 111 mmol/L   CO2 20 (L) 22 - 32 mmol/L   Glucose, Bld 143 (H) 70 - 99 mg/dL    Comment: Glucose reference range applies only to samples taken after fasting for at least 8 hours.   BUN 67 (H) 8 - 23 mg/dL   Creatinine, Ser 4.13 (H) 0.61 - 1.24 mg/dL   Calcium 8.4 (L) 8.9 - 10.3 mg/dL   Total Protein 8.2 (H) 6.5 - 8.1 g/dL   Albumin 3.7 3.5 - 5.0 g/dL   AST 12 (L) 15 - 41 U/L   ALT 19 0 - 44 U/L   Alkaline Phosphatase 38 38 - 126 U/L   Total Bilirubin 0.8 0.3 - 1.2 mg/dL   GFR, Estimated 21 (L) >60 mL/min    Comment: (NOTE) Calculated using the CKD-EPI Creatinine Equation (2021)    Anion gap 14 5 - 15    Comment: Performed at [facility], [address], [place], [place] [address]  CBC     Status: None   Collection Time: 06/11/23 10:23 AM  Result Value Ref Range   WBC 7.4 4.0 - 10.5 K/uL   RBC 5.15 4.22 - 5.81 MIL/uL   Hemoglobin 15.0 13.0 - 17.0 g/dL   HCT 02.7 25.3 - 66.4 %   MCV 92.6 80.0 - 100.0 fL   MCH 29.1 26.0 - 34.0 pg   MCHC 31.4 30.0 - 36.0 g/dL   RDW 40.3 47.4 - 25.9 %   Platelets 211 150 - 400 K/uL   nRBC 0.0 0.0 - 0.2 %    Comment:  Performed at [facility], [address] [place], [place] [address]  CBG monitoring, ED     Status: None   Collection Time: 06/12/23 12:49 AM  Result Value Ref Range   Glucose-Capillary 97 70 - 99 mg/dL    Comment:  Glucose reference range applies only to samples taken after fasting for at least 8 hours.   Comment 1 Notify RN    Comment 2 Document in Chart   Sodium, urine, random     Status: None   Collection Time: 06/12/23  1:28 AM  Result Value Ref Range   Sodium, Ur 63 mmol/L    Comment: Performed at [facility], [address], [place], [place] [address]  Creatinine, urine, random     Status: None   Collection Time: 06/12/23  1:28 AM  Result Value Ref Range   Creatinine, Urine 114 mg/dL    Comment: Performed at [facility], [address] [place], [place] [address]  HIV Antibody (routine testing w rflx)     Status: None   Collection Time: 06/12/23  3:47 AM  Result Value Ref Range   HIV Screen 4th Generation wRfx Non Reactive Non Reactive    Comment: Performed at [facility], [address], [place], [place] [address]  Basic metabolic panel     Status: Abnormal   Collection Time: 06/12/23  3:47 AM  Result Value Ref Range   Sodium 136 135 - 145 mmol/L   Potassium 5.0 3.5 - 5.1 mmol/L   Chloride 102 98 - 111 mmol/L   CO2 21 (L) 22 - 32 mmol/L   Glucose, Bld 88 70 - 99 mg/dL    Comment: Glucose reference range applies only to samples taken after fasting for at least 8 hours.   BUN 57 (H) 8 - 23 mg/dL   Creatinine, Ser 9.14 (H) 0.61 - 1.24 mg/dL   Calcium 8.4 (L) 8.9 - 10.3 mg/dL   GFR, Estimated 42 (L) >60 mL/min    Comment: (NOTE) Calculated using the CKD-EPI Creatinine Equation (2021)    Anion gap 13 5 - 15    Comment: Performed at [facility], [address] [place], [place] [address]  CBC     Status: None   Collection Time: 06/12/23  3:47 AM  Result Value Ref Range   WBC 5.8 4.0 - 10.5 K/uL   RBC 4.74 4.22 - 5.81 MIL/uL    Hemoglobin 14.0 13.0 - 17.0 g/dL   HCT 62.1 30.8 - 65.7 %   MCV 92.8 80.0 - 100.0 fL   MCH 29.5 26.0 - 34.0 pg   MCHC 31.8 30.0 - 36.0 g/dL   RDW 84.6 96.2 - 95.2 %   Platelets 163 150 - 400 K/uL   nRBC 0.0 0.0 - 0.2 %    Comment: Performed at [facility], [address] [place] [address], [place], [place] [address]  Hemoglobin A1c     Status: Abnormal   Collection Time: 06/12/23  3:47 AM  Result Value Ref Range   Hgb A1c MFr Bld 6.5 (H) 4.8 - 5.6 %    Comment: (NOTE) Pre diabetes:          5.7%-6.4%  Diabetes:              >6.4%  Glycemic control for   <7.0% adults with diabetes    Mean Plasma Glucose 139.85 mg/dL    Comment: Performed at [facility], [address], [place], [place] [address]  TSH     Status: None   Collection Time: 06/12/23  3:47 AM  Result Value Ref Range   TSH 0.623 0.350 - 4.500 uIU/mL    Comment: Performed by a 3rd Generation assay with a functional sensitivity of <=0.01  uIU/mL. Performed at [facility], [address] [place] [address] [place], [place] [address]   CBG monitoring, ED     Status: Abnormal   Collection Time: 06/12/23  4:12 AM  Result Value Ref Range   Glucose-Capillary 115 (H) 70 - 99 mg/dL    Comment: Glucose reference range applies only to samples taken after fasting for at least 8 hours.   Comment 1 Notify RN    Comment 2 Document in Chart   Glucose, capillary     Status: None   Collection Time: 06/12/23  8:17 AM  Result Value Ref Range   Glucose-Capillary 91 70 - 99 mg/dL    Comment: Glucose reference range applies only to samples taken after fasting for at least 8 hours.   DG Chest 1 View  Result Date: 06/12/2023 CLINICAL DATA:  72 year old male with hypoxia. Obstructing large bowel mass suspected on recent CT Abdomen and Pelvis . EXAM: CHEST  1 VIEW COMPARISON:  CT Abdomen and Pelvis yesterday, and earlier. FINDINGS: Portable AP semi upright view at 0501 hours. Somewhat low lung volumes stable from the CT yesterday. Heart size and  mediastinal contours remain within normal limits. No pneumothorax or pneumoperitoneum identified. Chronic blunting of the left lateral costophrenic angle. No pulmonary edema or acute pulmonary opacity. No acute osseous abnormality identified. IMPRESSION: Low lung volumes, otherwise no acute cardiopulmonary abnormality. Electronically Signed   By: Odessa Fleming M.D.   On: 06/12/2023 07:25   CT ABDOMEN PELVIS WO CONTRAST  Result Date: 06/11/2023 CLINICAL DATA:  Several day history of diffuse abdominal pain and constipation. * Tracking Code: BO * EXAM: CT ABDOMEN AND PELVIS WITHOUT CONTRAST TECHNIQUE: Multidetector CT imaging of the abdomen and pelvis was performed following the standard protocol without IV contrast. RADIATION DOSE REDUCTION: This exam was performed according to the departmental dose-optimization program which includes automated exposure control, adjustment of the mA and/or kV according to patient size and/or use of iterative reconstruction technique. COMPARISON:  Same-day abdominal radiograph, CT abdomen and pelvis dated 12/27/2016, MR abdomen dated 03/28/2021 FINDINGS: Lower chest: Left lower lobe nodule measures 5 x 2 mm (5:13), unchanged. No specific follow-up imaging recommended. No pleural effusion or pneumothorax demonstrated. Left atrial enlargement. Coronary artery calcifications. Hepatobiliary: No focal hepatic lesions. No intra or extrahepatic biliary ductal dilation. Normal gallbladder. Pancreas: No focal lesions or main ductal dilation. Spleen: Normal in size without focal abnormality. Adrenals/Urinary Tract: No adrenal nodules. No calculi or hydronephrosis. Multifocal bilateral exophytic lesions, many of which appear mildly hyperattenuating, correspond to simple and minimally complex cysts on prior MRI. No specific follow-up imaging recommended. No focal bladder wall thickening. Stomach/Bowel: Normal appearance of the stomach. Diffuse small and large bowel dilation to the level of the  distal descending colon, where there is a lobulated intraluminal density (3:47). Extensive colonic diverticulosis. Normal appendix. Vascular/Lymphatic: Aortic atherosclerosis. Bilateral common iliac arteries measure 1.9 cm. No enlarged abdominal or pelvic lymph nodes. Reproductive: Enlargement of the prostate with median lobe hypertrophy. Prostate fiducials in-situ. Other: No free fluid, fluid collection, or free air. Musculoskeletal: No acute or abnormal lytic or blastic osseous lesions. IMPRESSION: 1. Diffuse small and large bowel dilation to the level of the distal descending colon, where there is a lobulated intraluminal density, suspicious for colonic mass. Recommend colonoscopy for further evaluation. 2. Extensive colonic diverticulosis without evidence of acute diverticulitis. 3. Bilateral common iliac arteries measure 1.9 cm. 4. Aortic Atherosclerosis (ICD10-I70.0). Coronary artery calcifications. Assessment for potential risk factor modification, dietary therapy or pharmacologic therapy may  be warranted, if clinically indicated. Electronically Signed   By: Agustin Cree M.D.   On: 06/11/2023 18:23   DG Abdomen 1 View  Result Date: 06/11/2023 CLINICAL DATA:  Constipation for 8 days. EXAM: ABDOMEN - 1 VIEW COMPARISON:  X-ray 08/30/2022 FINDINGS: Transverse colon is mildly dilated up to 8.7 cm. There also some dilated loops of small bowel throughout the midabdomen measuring up to 5.3 cm. Minimal air in the rectum. Mild stool. No obvious free air but these are supine radiographs. Degenerative changes of the spine and pelvis. Treatment changes in the area of the prostate. IMPRESSION: Dilated loops of small and large bowel. This has has a differential including ileus or developing obstruction. With findings would recommend further workup with a contrast CT scan of the abdomen and pelvis when clinically appropriate Electronically Signed   By: Karen Kays M.D.   On: 06/11/2023 13:31       Assessment/Plan Descending colon mass vs stricture  - CT scan shows diffuse small and large bowel dilation to the level of the distal descending colon where there is a lobulated intraluminal density suspicious for colonic mass - scheduled for flex sig 10/24. Last colonoscopy 2020 showed multiple diverticula in the entire colon with marked stenosis in the mid sigmoid colon. - No indication for acute surgical intervention. Will follow up on flex sig results. Check CEA. We will follow.  FEN - NPO VTE - lovenox ID - none Admit - TRH service   AKI HTN HLD DM Bipolar H/o prostate cancer s/p radiotherapy (2021)   I reviewed nursing notes, Consultant GI notes, hospitalist notes, last 24 h vitals and pain scores, last 48 h intake and output, last 24 h labs and trends, and last 24 h imaging results.  Adam Phenix, [facility] 06/12/2023, 11:27 AM Please see Amion for pager number during day hours 7:00am-4:30pm or 7:00am -11:30am on weekends

## 2023-06-12 NOTE — Progress Notes (Signed)
Daily Progress Note Intern Pager: [phone]  Patient name: Bradley [name] Medical record number: [id number] Date of birth: Sep 12, 1950 Age: 72 y.o. Gender: male  Primary Care Provider: Medicine, [facility]: GI Code Status: full  Pt Overview and Major Events to Date:  10/22: admitted to FMTS  Assessment and Plan: Bradley [name] is a 72 y.o. male presenting with constipation and AKI. Cr significantly improved with fluids so appears likely that this was prerenal in the setting of poor PO intake and abdominal discomfort from constipation. Holding nephrotoxic medications and trending BMPs. Also with 1 week of constipation and finding of "lobulated intraluminal density, suspicious for colonic mass" responsible for diffuse small and large bowel dilation to distal descending colon on CT A/P without contrast. Does not appear obstructed on exam and is passing flatus. Consulting GI today for possible colonoscopy to further characterize mass.   Assessment & Plan AKI (acute kidney injury) (HCC) Creatinine on admission 3.07 with a BUN of 67 improved to 1.74 and 57 with IVF.  Likely prerenal etiology.  UA unremarkable so less concern for obstructive etiology. TSH, A1c WNL. --continue IVF - Strict I/Os - Hold home lisinopril + HCTZ - PT/OT eval/treat  Abnormal CT scan Patient has evidence of a lobulated intraluminal density that is concerning for colonic mass.  Denies B symptoms. History of colon adenomas.  Endoscopy 2020: One 4 mm polyp removed found to be tubular adenoma, no high-grade dysplasia or malignancy.  Notably, diagnosed with T1c adenocarcinoma of prostate in 2021, status post radiotherapy. GI consulted, concern for colon mass vs obstruction from tight diverticular stricture with stool ball. - flex sigmoidoscopy tomorrow (10/24) Constipation Patient initially presented for constipation, CT abdomen pelvis findings as above. Has not been passing flatus.  -  2 SMOG enemas per GI   Chronic and Stable Issues: Hypertension: Hydrochlorothiazide 25 mg daily, lisinopril 40 mg daily, amlodipine 10 mg daily.  Patient is not taking home Bystolic 10mg  although on med list. Will hold antihypertensives that are nephrotoxic: Namely, lisinopril. DM2: A1c 7.1, not on medication. CBGs 4xdaily and SSI.  Bipolar disorder II: Decreased risperidone 2 mg nightly. Depakote home dose, no renal adjustment needed   FEN/GI: NPO for possible procedure with GI PPx: lovenox Dispo: pending clinical improvement  Subjective:  Pt states he is feeling well today. States he has not had a bowel movement yet and has not passed gas since yesterday. Discussed plan for GI to evaluate him today. Pt did not have any questions or concerns.  Objective: Temp:  [97.7 F (36.5 C)-99.2 F (37.3 C)] 98.1 F (36.7 C) (10/23 0806) Pulse Rate:  [51-86] 70 (10/23 0806) Resp:  [14-33] 17 (10/23 0806) BP: (105-143)/(47-78) 116/77 (10/23 0806) SpO2:  [86 %-100 %] 92 % (10/23 0806) Weight:  [109.8 kg] 109.8 kg (10/22 1021) Physical Exam: General: older male laying down in bed, pleasant and conversant, in NAD Cardiovascular: RRR, normal S1/S2 Respiratory: CTAB, normal WOB on RA Abdomen: decreased bowel sounds, moderately tense and distended, nontender to palpation Extremities: no edema to BLE  Laboratory: Most recent CBC Lab Results  Component Value Date   WBC 5.8 06/12/2023   HGB 14.0 06/12/2023   HCT 44.0 06/12/2023   MCV 92.8 06/12/2023   PLT 163 06/12/2023   Most recent BMP    Latest Ref Rng & Units 06/12/2023    3:47 AM  BMP  Glucose 70 - 99 mg/dL 88   BUN 8 - 23 mg/dL 57   Creatinine  0.61 - 1.24 mg/dL 7.25   Sodium 366 - 440 mmol/L 136   Potassium 3.5 - 5.1 mmol/L 5.0   Chloride 98 - 111 mmol/L 102   CO2 22 - 32 mmol/L 21   Calcium 8.9 - 10.3 mg/dL 8.4     TSH: 3.474 QV9D: 6.5  Imaging/Diagnostic Tests: CXR: Low lung volumes, blunted L costophrenic angle  (chronic) otherwise no acute cardiopulmonary abnormality.   Lorayne Bender, MD 06/12/2023, 8:16 AM  PGY-1, [facility] FPTS Intern pager: [phone], text pages welcome Secure chat group [facility] Teaching Service

## 2023-06-12 NOTE — ED Notes (Signed)
Asked NT to transport patient at this time.

## 2023-06-12 NOTE — Plan of Care (Signed)

## 2023-06-12 NOTE — ED Notes (Signed)
Attempted to get patient to change into hospital gown, patient refused.

## 2023-06-12 NOTE — ED Notes (Signed)
Patient consented to being placed in hospital gown. Patient cleaned self without assistance. This RN and Jane NT cleaned linens and room.

## 2023-06-12 NOTE — ED Provider Notes (Signed)
[facility] [address]  SURGICAL Provider Note   CSN: [id number] Arrival date & time: 06/11/23  1015     History  Chief Complaint  Patient presents with   Constipation    Vrishank Nepomuceno is a 72 y.o. male.  72 year old male presents today for concern of constipation.  Also endorses weeklong of abdominal pain.  Last bowel movement was about a week ago.  States otherwise he has been able to urinate without any difficulty.  No nausea or vomiting.  No flank pain, hematuria.  The history is provided by the patient. No language interpreter was used.       Home Medications Prior to Admission medications   Medication Sig Start Date End Date Taking? Authorizing Provider  amLODipine (NORVASC) 10 MG tablet Take 10 mg by mouth daily.   Yes [provider]  divalproex (DEPAKOTE ER) 500 MG 24 hr tablet Take 500 mg by mouth 2 (two) times daily.   Yes [provider]  hydrochlorothiazide (HYDRODIURIL) 50 MG tablet Take 50 mg by mouth daily.   Yes [provider]  lisinopril (PRINIVIL,ZESTRIL) 40 MG tablet Take 40 mg by mouth daily.   Yes [provider]  nebivolol (BYSTOLIC) 10 MG tablet Take 1 tablet (10 mg total) by mouth daily. 02/25/23  Yes Meriam Sprague, MD  pravastatin (PRAVACHOL) 40 MG tablet Take 1 tablet (40 mg total) by mouth every evening. 04/09/23 07/08/23 Yes Chandrasekhar, Mahesh A, MD  risperiDONE (RISPERDAL) 2 MG tablet Take 2 mg by mouth at bedtime. 05/21/23  Yes [provider]      Allergies    Patient has no known allergies.    Review of Systems   Review of Systems  Constitutional:  Negative for chills and fever.  Gastrointestinal:  Positive for abdominal pain. Negative for nausea and vomiting.  Genitourinary:  Negative for dysuria and flank pain.  Neurological:  Negative for light-headedness.  All other systems reviewed and are negative.   Physical Exam Updated Vital Signs BP (!) 116/56 (BP  Location: Left Arm)   Pulse 72   Temp 99.2 F (37.3 C) (Oral)   Resp 19   Ht 5\' 8"  (1.727 m)   Wt 109.8 kg   SpO2 95%   BMI 36.80 kg/m  Physical Exam Vitals and nursing note reviewed.  Constitutional:      General: He is not in acute distress.    Appearance: Normal appearance. He is not ill-appearing.  HENT:     Head: Normocephalic and atraumatic.     Nose: Nose normal.  Eyes:     Conjunctiva/sclera: Conjunctivae normal.  Cardiovascular:     Rate and Rhythm: Normal rate and regular rhythm.  Pulmonary:     Effort: Pulmonary effort is normal. No respiratory distress.  Abdominal:     General: There is distension.     Palpations: Abdomen is soft.     Tenderness: There is abdominal tenderness. There is no right CVA tenderness, left CVA tenderness or guarding.  Musculoskeletal:        General: No deformity. Normal range of motion.  Skin:    Findings: No rash.  Neurological:     Mental Status: He is alert.     ED Results / Procedures / Treatments   Labs (all labs ordered are listed, but only abnormal results are displayed) Labs Reviewed  COMPREHENSIVE METABOLIC PANEL - Abnormal; Notable for the following components:      Result Value   Sodium 134 (*)  CO2 20 (*)    Glucose, Bld 143 (*)    BUN 67 (*)    Creatinine, Ser 3.07 (*)    Calcium 8.4 (*)    Total Protein 8.2 (*)    AST 12 (*)    GFR, Estimated 21 (*)    All other components within normal limits  BASIC METABOLIC PANEL - Abnormal; Notable for the following components:   CO2 21 (*)    BUN 57 (*)    Creatinine, Ser 1.74 (*)    Calcium 8.4 (*)    GFR, Estimated 42 (*)    All other components within normal limits  HEMOGLOBIN A1C - Abnormal; Notable for the following components:   Hgb A1c MFr Bld 6.5 (*)    All other components within normal limits  CBG MONITORING, ED - Abnormal; Notable for the following components:   Glucose-Capillary 115 (*)    All other components within normal limits  LIPASE, BLOOD   CBC  URINALYSIS, ROUTINE W REFLEX MICROSCOPIC  CBC  SODIUM, URINE, RANDOM  CREATININE, URINE, RANDOM  TSH  HIV ANTIBODY (ROUTINE TESTING W REFLEX)  CBG MONITORING, ED    EKG EKG Interpretation Date/Time:  Tuesday June 11 2023 12:11:07 EDT Ventricular Rate:  68 PR Interval:  132 QRS Duration:  110 QT Interval:  378 QTC Calculation: 402 R Axis:   -42  Text Interpretation: Sinus rhythm Left anterior fascicular block Borderline T abnormalities, inferior leads Baseline wander Confirmed by Gerhard Munch [phone]) on 06/11/2023 2:00:16 PM  Radiology CT ABDOMEN PELVIS WO CONTRAST  Result Date: 06/11/2023 CLINICAL DATA:  Several day history of diffuse abdominal pain and constipation. * Tracking Code: BO * EXAM: CT ABDOMEN AND PELVIS WITHOUT CONTRAST TECHNIQUE: Multidetector CT imaging of the abdomen and pelvis was performed following the standard protocol without IV contrast. RADIATION DOSE REDUCTION: This exam was performed according to the departmental dose-optimization program which includes automated exposure control, adjustment of the mA and/or kV according to patient size and/or use of iterative reconstruction technique. COMPARISON:  Same-day abdominal radiograph, CT abdomen and pelvis dated 12/27/2016, MR abdomen dated 03/28/2021 FINDINGS: Lower chest: Left lower lobe nodule measures 5 x 2 mm (5:13), unchanged. No specific follow-up imaging recommended. No pleural effusion or pneumothorax demonstrated. Left atrial enlargement. Coronary artery calcifications. Hepatobiliary: No focal hepatic lesions. No intra or extrahepatic biliary ductal dilation. Normal gallbladder. Pancreas: No focal lesions or main ductal dilation. Spleen: Normal in size without focal abnormality. Adrenals/Urinary Tract: No adrenal nodules. No calculi or hydronephrosis. Multifocal bilateral exophytic lesions, many of which appear mildly hyperattenuating, correspond to simple and minimally complex cysts on prior MRI.  No specific follow-up imaging recommended. No focal bladder wall thickening. Stomach/Bowel: Normal appearance of the stomach. Diffuse small and large bowel dilation to the level of the distal descending colon, where there is a lobulated intraluminal density (3:47). Extensive colonic diverticulosis. Normal appendix. Vascular/Lymphatic: Aortic atherosclerosis. Bilateral common iliac arteries measure 1.9 cm. No enlarged abdominal or pelvic lymph nodes. Reproductive: Enlargement of the prostate with median lobe hypertrophy. Prostate fiducials in-situ. Other: No free fluid, fluid collection, or free air. Musculoskeletal: No acute or abnormal lytic or blastic osseous lesions. IMPRESSION: 1. Diffuse small and large bowel dilation to the level of the distal descending colon, where there is a lobulated intraluminal density, suspicious for colonic mass. Recommend colonoscopy for further evaluation. 2. Extensive colonic diverticulosis without evidence of acute diverticulitis. 3. Bilateral common iliac arteries measure 1.9 cm. 4. Aortic Atherosclerosis (ICD10-I70.0). Coronary artery calcifications. Assessment for  potential risk factor modification, dietary therapy or pharmacologic therapy may be warranted, if clinically indicated. Electronically Signed   By: Agustin Cree M.D.   On: 06/11/2023 18:23   DG Abdomen 1 View  Result Date: 06/11/2023 CLINICAL DATA:  Constipation for 8 days. EXAM: ABDOMEN - 1 VIEW COMPARISON:  X-ray 08/30/2022 FINDINGS: Transverse colon is mildly dilated up to 8.7 cm. There also some dilated loops of small bowel throughout the midabdomen measuring up to 5.3 cm. Minimal air in the rectum. Mild stool. No obvious free air but these are supine radiographs. Degenerative changes of the spine and pelvis. Treatment changes in the area of the prostate. IMPRESSION: Dilated loops of small and large bowel. This has has a differential including ileus or developing obstruction. With findings would recommend further  workup with a contrast CT scan of the abdomen and pelvis when clinically appropriate Electronically Signed   By: Karen Kays M.D.   On: 06/11/2023 13:31    Procedures Procedures    Medications Ordered in ED Medications  enoxaparin (LOVENOX) injection 30 mg (has no administration in time range)  acetaminophen (TYLENOL) tablet 650 mg (650 mg Oral Given 06/12/23 0607)  polyethylene glycol (MIRALAX / GLYCOLAX) packet 17 g (17 g Oral Given 06/11/23 2111)  divalproex (DEPAKOTE ER) 24 hr tablet 500 mg (500 mg Oral Given 06/11/23 2116)  amLODipine (NORVASC) tablet 10 mg (has no administration in time range)  pravastatin (PRAVACHOL) tablet 10 mg (has no administration in time range)  risperiDONE (RISPERDAL) tablet 0.5 mg (0.5 mg Oral Given 06/11/23 2112)  0.9 %  sodium chloride infusion ( Intravenous New Bag/Given 06/12/23 0607)  insulin aspart (novoLOG) injection 0-9 Units ( Subcutaneous Not Given 06/12/23 0417)  lactated ringers bolus 1,000 mL (0 mLs Intravenous Stopped 06/11/23 1345)  HYDROcodone-acetaminophen (NORCO/VICODIN) 5-325 MG per tablet 1 tablet (1 tablet Oral Given 06/11/23 1239)  ondansetron (ZOFRAN) injection 4 mg (4 mg Intravenous Given 06/11/23 1237)  lactated ringers bolus 1,000 mL (0 mLs Intravenous Stopped 06/11/23 1702)    ED Course/ Medical Decision Making/ A&P                                 Medical Decision Making Amount and/or Complexity of Data Reviewed Labs: ordered. Radiology: ordered.  Risk Prescription drug management. Decision regarding hospitalization.   72 year old male presents today for concern of abdominal pain and constipation.  Ongoing for about 1 week.  Labs obtained.  Shows AKI with creatinine of 3.07.  He says he has lost his appetite.  Likely due to dehydration.  CBC is unremarkable.  No other acute concerns.  UA without evidence of UTI.  KUB shows some signs of potential obstruction.  Will further evaluate with CT abdomen pelvis without  contrast.  This is pending at the time of shift change.  Signed out to oncoming provider to follow-up on CT imaging and admit for AKI.   Final Clinical Impression(s) / ED Diagnoses Final diagnoses:  AKI (acute kidney injury) (HCC)  Abnormal CT scan  Constipation, unspecified constipation type  Elevated BUN    Rx / DC Orders ED Discharge Orders     None         Marita Kansas, PA-C 06/12/23 4098    Gerhard Munch, MD 06/12/23 [id number]

## 2023-06-12 NOTE — Progress Notes (Signed)
Pt admitted to unit. Oriented to surroundihgs, call light system, fall protocol and belongings policy. Call light and items within reach. Bed alarm activated, bed in lowest position. Care plan reviewed with pt. Daughter notified of admission per pt request.

## 2023-06-12 NOTE — H&P (View-Only) (Signed)
Consultation  Referring Provider: Dr. Manson Passey     Primary Care Physician:  [facility] Gastroenterologist: Dr. Marina Goodell        Reason for Consultation: Abnormal CT of the abdomen            HPI:   Bradley [name] is a 72 y.o. male with a past medical history as listed below including diabetes and history of adenomatous polyps, who presented to the ED on 06/11/2023 with constipation and AKI.    At time of presentation patient described that he had not been eating or drinking well since constipation started a week ago.    Today, patient explains that about a week ago he noticed that he was unable to pass gas or have a stool, he has still not passed gas or had a stool over the past week.  Tells me along with this has noticed a decrease in appetite but was actually eating his normal food just in smaller amounts prior to admission.  He has been n.p.o. overnight.  Tells me he has no nausea or vomiting.  Generalized abdominal discomfort rated as a 5-6/10.  Associated symptoms include a 10 pound weight loss over the past week per patient.    Denies fever, chills, nausea or vomiting.  ER course: CT with lobulated intraluminal density, suspicious for colonic mass with diffuse small and large bowel dilation to distal descending colon  GI history: 01/28/2019 colonoscopy with Dr. 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      Past Surgical History:  Procedure Laterality Date   COLONOSCOPY  2009, 2015   ENTEROSCOPY N/A 07/28/2015   Procedure: ENTEROSCOPY;  Surgeon: Meryl Dare, MD;  Location: [facility] ENDOSCOPY;  Service: Endoscopy;  Laterality: N/A;    Family History  Problem Relation Age of Onset   Cancer Brother        abdominal CA   Colon cancer Neg Hx    Esophageal cancer Neg Hx    Rectal cancer Neg Hx    Stomach cancer Neg Hx      Social History   Tobacco Use   Smoking status: Every Day    Current packs/day: 1.00    Average packs/day: 1 pack/day for 20.0 years (20.0 ttl pk-yrs)    Types: Cigarettes   Smokeless tobacco: Never  Vaping Use   Vaping status: Never Used  Substance Use Topics   Alcohol use: No   Drug use: No    Prior to Admission medications   Medication Sig Start Date End Date Taking? Authorizing Provider  amLODipine (NORVASC) 10 MG tablet Take 10 mg by mouth daily.   Yes [provider]  divalproex (DEPAKOTE ER) 500 MG 24 hr tablet Take 500 mg by mouth 2 (two) times daily.   Yes [provider]  hydrochlorothiazide (HYDRODIURIL)  50 MG tablet Take 50 mg by mouth daily.   Yes [provider]  lisinopril (PRINIVIL,ZESTRIL) 40 MG tablet Take 40 mg by mouth daily.   Yes [provider]  nebivolol (BYSTOLIC) 10 MG tablet Take 1 tablet (10 mg total) by mouth daily. 02/25/23  Yes Meriam Sprague, MD  pravastatin (PRAVACHOL) 40 MG tablet Take 1 tablet (40 mg total) by mouth every evening. 04/09/23 07/08/23 Yes Chandrasekhar, Mahesh A, MD  risperiDONE (RISPERDAL) 2 MG tablet Take 2 mg by mouth at bedtime. 05/21/23  Yes [provider]    Current Facility-Administered Medications  Medication Dose Route Frequency Provider Last Rate Last Admin   0.9 %  sodium chloride infusion   Intravenous Continuous Alfredo Martinez, MD 100 mL/hr at 06/12/23 0607 New Bag  at 06/12/23 6644   acetaminophen (TYLENOL) tablet 650 mg  650 mg Oral Q6H PRN Alfredo Martinez, MD   650 mg at 06/12/23 0347   amLODipine (NORVASC) tablet 10 mg  10 mg Oral Daily Alfredo Martinez, MD   10 mg at 06/12/23 0855   divalproex (DEPAKOTE ER) 24 hr tablet 500 mg  500 mg Oral BID Alfredo Martinez, MD   500 mg at 06/11/23 2116   enoxaparin (LOVENOX) injection 40 mg  40 mg Subcutaneous Q24H Utomwen, Adesuwa, RPH       insulin aspart (novoLOG) injection 0-9 Units  0-9 Units Subcutaneous Q4H Maxwell, Allee, MD       polyethylene glycol (MIRALAX / GLYCOLAX) packet 17 g  17 g Oral Daily Alfredo Martinez, MD   17 g at 06/12/23 0855   pravastatin (PRAVACHOL) tablet 10 mg  10 mg Oral q1800 Alfredo Martinez, MD       risperiDONE (RISPERDAL) tablet 0.5 mg  0.5 mg Oral QHS Maxwell, Allee, MD   0.5 mg at 06/11/23 2112    Allergies as of 06/11/2023   (No Known Allergies)     Review of Systems:    Constitutional: No fever or chills Skin: No rash Cardiovascular: No chest pain Respiratory: No SOB  Gastrointestinal: See HPI and otherwise negative Genitourinary: No dysuria Neurological: No headache, dizziness or syncope Musculoskeletal: No new muscle or joint pain Hematologic: No bleeding Psychiatric: No history of depression or anxiety    Physical Exam:  Vital signs in last 24 hours: Temp:  [97.7 F (36.5 C)-99.2 F (37.3 C)] 98.1 F (36.7 C) (10/23 0806) Pulse Rate:  [51-86] 70 (10/23 0806) Resp:  [14-33] 17 (10/23 0806) BP: (105-143)/(47-78) 116/77 (10/23 0806) SpO2:  [86 %-100 %] 92 % (10/23 0806) Weight:  [109.8 kg] 109.8 kg (10/22 1021) Last BM Date : 06/04/23 (per pt repory) General:   Pleasant AA male appears to be in NAD, Well developed, Well nourished, alert and cooperative Head:  Normocephalic and atraumatic. Eyes:   PEERL, EOMI. No icterus. Conjunctiva pink. Ears:  Normal auditory acuity. Neck:  Supple Throat: Oral cavity and pharynx without inflammation, swelling or lesion. Teeth  in good condition. Lungs: Respirations even and unlabored. Lungs clear to auscultation bilaterally.   No wheezes, crackles, or rhonchi.  Heart: Normal S1, S2. No MRG. Regular rate and rhythm. No peripheral edema, cyanosis or pallor.  Abdomen: Tense, marked distention, mild to moderate TTP over entirety of abdomen, decreased/absent bowel sounds, hypertympanic. No rebound or guarding. No appreciable masses or hepatomegaly. Rectal:  Not performed.  Msk:  Symmetrical without gross deformities. Peripheral pulses intact.  Extremities:  Without edema, no deformity or joint abnormality. Normal ROM, normal sensation. Neurologic:  Alert  and  oriented x4;  grossly normal neurologically.  Skin:   Dry and intact without significant lesions or rashes. Psychiatric: Demonstrates good judgement and reason without abnormal affect or behaviors.   LAB RESULTS: Recent Labs    06/11/23 1023 06/12/23 0347  WBC 7.4 5.8  HGB 15.0 14.0  HCT 47.7 44.0  PLT 211 163   BMET Recent Labs    06/11/23 1023 06/12/23 0347  NA 134* 136  K 4.8 5.0  CL 100 102  CO2 20* 21*  GLUCOSE 143* 88  BUN 67* 57*  CREATININE 3.07* 1.74*  CALCIUM 8.4* 8.4*   LFT Recent Labs    06/11/23 1023  PROT 8.2*  ALBUMIN 3.7  AST 12*  ALT 19  ALKPHOS 38  BILITOT 0.8   STUDIES: DG Chest 1 View  Result Date: 06/12/2023 CLINICAL DATA:  72 year old male with hypoxia. Obstructing large bowel mass suspected on recent CT Abdomen and Pelvis . EXAM: CHEST  1 VIEW COMPARISON:  CT Abdomen and Pelvis yesterday, and earlier. FINDINGS: Portable AP semi upright view at 0501 hours. Somewhat low lung volumes stable from the CT yesterday. Heart size and mediastinal contours remain within normal limits. No pneumothorax or pneumoperitoneum identified. Chronic blunting of the left lateral costophrenic angle. No pulmonary edema or acute pulmonary opacity. No acute osseous abnormality identified. IMPRESSION: Low lung volumes, otherwise no acute  cardiopulmonary abnormality. Electronically Signed   By: Odessa Fleming M.D.   On: 06/12/2023 07:25   CT ABDOMEN PELVIS WO CONTRAST  Result Date: 06/11/2023 CLINICAL DATA:  Several day history of diffuse abdominal pain and constipation. * Tracking Code: BO * EXAM: CT ABDOMEN AND PELVIS WITHOUT CONTRAST TECHNIQUE: Multidetector CT imaging of the abdomen and pelvis was performed following the standard protocol without IV contrast. RADIATION DOSE REDUCTION: This exam was performed according to the departmental dose-optimization program which includes automated exposure control, adjustment of the mA and/or kV according to patient size and/or use of iterative reconstruction technique. COMPARISON:  Same-day abdominal radiograph, CT abdomen and pelvis dated 12/27/2016, MR abdomen dated 03/28/2021 FINDINGS: Lower chest: Left lower lobe nodule measures 5 x 2 mm (5:13), unchanged. No specific follow-up imaging recommended. No pleural effusion or pneumothorax demonstrated. Left atrial enlargement. Coronary artery calcifications. Hepatobiliary: No focal hepatic lesions. No intra or extrahepatic biliary ductal dilation. Normal gallbladder. Pancreas: No focal lesions or main ductal dilation. Spleen: Normal in size without focal abnormality. Adrenals/Urinary Tract: No adrenal nodules. No calculi or hydronephrosis. Multifocal bilateral exophytic lesions, many of which appear mildly hyperattenuating, correspond to simple and minimally complex cysts on prior MRI. No specific follow-up imaging recommended. No focal bladder wall thickening. Stomach/Bowel: Normal appearance of the stomach. Diffuse small and large bowel dilation to the level of the distal descending colon, where there is a lobulated intraluminal density (3:47). Extensive colonic diverticulosis. Normal appendix. Vascular/Lymphatic: Aortic atherosclerosis. Bilateral common iliac arteries measure 1.9 cm. No enlarged abdominal or pelvic lymph nodes. Reproductive: Enlargement  of the prostate with median lobe hypertrophy. Prostate fiducials in-situ. Other: No free fluid, fluid collection, or free air. Musculoskeletal: No acute or abnormal lytic or blastic osseous lesions. IMPRESSION: 1. Diffuse small and large bowel dilation to the level of the distal descending colon, where there is a lobulated intraluminal density, suspicious for colonic mass. Recommend colonoscopy for further evaluation. 2. Extensive colonic diverticulosis without evidence of acute diverticulitis. 3. Bilateral common iliac arteries measure 1.9 cm. 4. Aortic Atherosclerosis (ICD10-I70.0). Coronary artery calcifications. Assessment for potential risk factor modification, dietary therapy or  pharmacologic therapy may be warranted, if clinically indicated. Electronically Signed   By: Agustin Cree M.D.   On: 06/11/2023 18:23   DG Abdomen 1 View  Result Date: 06/11/2023 CLINICAL DATA:  Constipation for 8 days. EXAM: ABDOMEN - 1 VIEW COMPARISON:  X-ray 08/30/2022 FINDINGS: Transverse colon is mildly dilated up to 8.7 cm. There also some dilated loops of small bowel throughout the midabdomen measuring up to 5.3 cm. Minimal air in the rectum. Mild stool. No obvious free air but these are supine radiographs. Degenerative changes of the spine and pelvis. Treatment changes in the area of the prostate. IMPRESSION: Dilated loops of small and large bowel. This has has a differential including ileus or developing obstruction. With findings would recommend further workup with a contrast CT scan of the abdomen and pelvis when clinically appropriate Electronically Signed   By: Karen Kays M.D.   On: 06/11/2023 13:31     Impression / Plan:   Impression: 1.  Abnormal CT of the abdomen: Showing small and large bowel dilation to the level of the distal descending colon with question of colonic mass, last colonoscopy in 2020 with some stricturing around this site due to diverticula; question colon mass versus more likely tight  diverticular stricture with stool ball 2.  AKI: Creatinine on admission 3.07 with a BUN of 67, thought likely prerenal, improvement today with a creatinine of 1.74 and a BUN of 57 3.  Constipation: Likely from obstruction as above  Plan: 1.  Patient denies any nausea or vomiting.  We will go ahead and give 2 smog enemas and see how he does.  2.  Goal would be to possibly achieve a flex sigmoidoscopy tomorrow, pending course over the day.  Will go ahead and set him up for this with Dr. Adela Lank.  Did discuss risks, benefits, limitations and alternatives and the patient agrees to proceed. 3.  Keep NPO 4.  Will also consult surgery  Thank you for your kind consultation, we will continue to follow.  Violet Baldy Meyer Dockery  06/12/2023, 10:15 AM

## 2023-06-13 ENCOUNTER — Encounter (HOSPITAL_COMMUNITY): Payer: Self-pay | Admitting: Student

## 2023-06-13 ENCOUNTER — Inpatient Hospital Stay (HOSPITAL_COMMUNITY): Payer: Medicare HMO

## 2023-06-13 ENCOUNTER — Encounter (HOSPITAL_COMMUNITY)
Admission: EM | Disposition: A | Discharge status: Skilled Nursing Facility | Payer: Self-pay | Source: Home / Self Care | Attending: Internal Medicine

## 2023-06-13 DIAGNOSIS — K56600 Partial intestinal obstruction, unspecified as to cause: Secondary | ICD-10-CM

## 2023-06-13 DIAGNOSIS — I129 Hypertensive chronic kidney disease with stage 1 through stage 4 chronic kidney disease, or unspecified chronic kidney disease: Secondary | ICD-10-CM | POA: Diagnosis not present

## 2023-06-13 DIAGNOSIS — K648 Other hemorrhoids: Secondary | ICD-10-CM

## 2023-06-13 DIAGNOSIS — N189 Chronic kidney disease, unspecified: Secondary | ICD-10-CM

## 2023-06-13 DIAGNOSIS — D125 Benign neoplasm of sigmoid colon: Secondary | ICD-10-CM | POA: Diagnosis not present

## 2023-06-13 DIAGNOSIS — R933 Abnormal findings on diagnostic imaging of other parts of digestive tract: Secondary | ICD-10-CM | POA: Diagnosis not present

## 2023-06-13 DIAGNOSIS — E1122 Type 2 diabetes mellitus with diabetic chronic kidney disease: Secondary | ICD-10-CM | POA: Diagnosis not present

## 2023-06-13 DIAGNOSIS — K5730 Diverticulosis of large intestine without perforation or abscess without bleeding: Secondary | ICD-10-CM

## 2023-06-13 DIAGNOSIS — K56699 Other intestinal obstruction unspecified as to partial versus complete obstruction: Secondary | ICD-10-CM | POA: Diagnosis not present

## 2023-06-13 HISTORY — PX: SUBMUCOSAL TATTOO INJECTION: SHX6856

## 2023-06-13 HISTORY — PX: FLEXIBLE SIGMOIDOSCOPY: SHX5431

## 2023-06-13 HISTORY — DX: Partial intestinal obstruction, unspecified as to cause: K56.600

## 2023-06-13 LAB — GLUCOSE, CAPILLARY
Glucose-Capillary: 59 mg/dL — ABNORMAL LOW (ref 70–99)
Glucose-Capillary: 129 mg/dL — ABNORMAL HIGH (ref 70–99)
Glucose-Capillary: 100 mg/dL — ABNORMAL HIGH (ref 70–99)
Glucose-Capillary: 97 mg/dL (ref 70–99)
Glucose-Capillary: 68 mg/dL — ABNORMAL LOW (ref 70–99)
Glucose-Capillary: 121 mg/dL — ABNORMAL HIGH (ref 70–99)
Glucose-Capillary: 108 mg/dL — ABNORMAL HIGH (ref 70–99)
Glucose-Capillary: 103 mg/dL — ABNORMAL HIGH (ref 70–99)
Glucose-Capillary: 77 mg/dL (ref 70–99)

## 2023-06-13 LAB — BASIC METABOLIC PANEL
Anion gap: 10 (ref 5–15)
BUN: 37 mg/dL — ABNORMAL HIGH (ref 8–23)
CO2: 23 mmol/L (ref 22–32)
Calcium: 8.9 mg/dL (ref 8.9–10.3)
Chloride: 105 mmol/L (ref 98–111)
Creatinine, Ser: 1.35 mg/dL — ABNORMAL HIGH (ref 0.61–1.24)
GFR, Estimated: 57 mL/min — ABNORMAL LOW (ref >60)
Glucose, Bld: 89 mg/dL (ref 70–99)
Potassium: 4.7 mmol/L (ref 3.5–5.1)
Sodium: 138 mmol/L (ref 135–145)

## 2023-06-13 LAB — CBC
HCT: 43.9 % (ref 39.0–52.0)
Hemoglobin: 14 g/dL (ref 13.0–17.0)
MCH: 29.2 pg (ref 26.0–34.0)
MCHC: 31.9 g/dL (ref 30.0–36.0)
MCV: 91.5 fL (ref 80.0–100.0)
Platelets: 165 10*3/uL (ref 150–400)
RBC: 4.8 MIL/uL (ref 4.22–5.81)
RDW: 15 % (ref 11.5–15.5)
WBC: 6.4 10*3/uL (ref 4.0–10.5)
nRBC: 0 % (ref 0.0–0.2)

## 2023-06-13 LAB — PREALBUMIN: Prealbumin: 17 mg/dL — ABNORMAL LOW (ref 18–38)

## 2023-06-13 SURGERY — SIGMOIDOSCOPY, FLEXIBLE
Anesthesia: Monitor Anesthesia Care

## 2023-06-13 MED ORDER — PROPOFOL 500 MG/50ML IV EMUL
INTRAVENOUS | Status: DC | PRN
  Administered 2023-06-13: 125 ug/kg/min via INTRAVENOUS

## 2023-06-13 MED ORDER — PHENYLEPHRINE HCL (PRESSORS) 10 MG/ML IV SOLN
INTRAVENOUS | Status: DC | PRN
  Administered 2023-06-13: 100 ug via INTRAVENOUS

## 2023-06-13 MED ORDER — SODIUM CHLORIDE 0.9 % IV SOLN: INTRAVENOUS | Status: AC

## 2023-06-13 MED ORDER — DEXTROSE 50 % IV SOLN: 12.5000 g | Freq: Once | INTRAVENOUS | Status: DC

## 2023-06-13 MED ORDER — SODIUM CHLORIDE 0.9 % IV SOLN: INTRAVENOUS | Status: DC

## 2023-06-13 MED ORDER — DEXTROSE 50 % IV SOLN
INTRAVENOUS | Status: AC
  Filled 2023-06-13: qty 50

## 2023-06-13 MED ORDER — DEXTROSE 50 % IV SOLN: 12.5000 g | INTRAVENOUS | Status: DC

## 2023-06-13 MED ORDER — SPOT INK MARKER SYRINGE KIT
PACK | SUBMUCOSAL | Status: AC
  Filled 2023-06-13: qty 5

## 2023-06-13 MED ORDER — DEXTROSE 50 % IV SOLN
12.5000 g | INTRAVENOUS | Status: AC
  Administered 2023-06-13: 12.5 g via INTRAVENOUS
  Filled 2023-06-13: qty 50

## 2023-06-13 MED ORDER — GLUCOSE 40 % PO GEL: 1.0000 | ORAL | Status: DC | PRN

## 2023-06-13 MED ORDER — PROPOFOL 10 MG/ML IV BOLUS
INTRAVENOUS | Status: DC | PRN
  Administered 2023-06-13: 100 mg via INTRAVENOUS

## 2023-06-13 MED ORDER — DEXTROSE 50 % IV SOLN
12.5000 g | Freq: Once | INTRAVENOUS | Status: AC
  Administered 2023-06-13: 12.5 g via INTRAVENOUS

## 2023-06-13 MED ORDER — SPOT INK MARKER SYRINGE KIT
PACK | SUBMUCOSAL | Status: DC | PRN
  Administered 2023-06-13: 4 mL via SUBMUCOSAL

## 2023-06-13 MED ORDER — LIDOCAINE 2% (20 MG/ML) 5 ML SYRINGE
INTRAMUSCULAR | Status: DC | PRN
  Administered 2023-06-13: 100 mg via INTRAVENOUS

## 2023-06-13 NOTE — Transfer of Care (Signed)
Immediate Anesthesia Transfer of Care Note  Patient: Fonnie Welburn  Procedure(s) Performed: FLEXIBLE SIGMOIDOSCOPY SUBMUCOSAL TATTOO INJECTION  Patient Location: PACU  Anesthesia Type:MAC  Level of Consciousness: awake, alert , and oriented  Airway & Oxygen Therapy: Patient Spontanous Breathing and Patient connected to nasal cannula oxygen  Post-op Assessment: Report given to RN and Post -op Vital signs reviewed and stable  Post vital signs: Reviewed and stable  Last Vitals:  Vitals Value Taken Time  BP 123/75   Temp 97.4   Pulse 77 06/13/23 1010  Resp 20 06/13/23 1010  SpO2 94 % 06/13/23 1010  Vitals shown include unfiled device data.  Last Pain:  Vitals:   06/13/23 0900  TempSrc: Temporal  PainSc: 0-No pain         Complications: No notable events documented.

## 2023-06-13 NOTE — Interval H&P Note (Signed)
History and Physical Interval Note: Patient states he feels the same - no abdominal pain. He is not passing much gas or stool other than with the enemas this AM. Flex sig planned today to see if we can visualize the area of concern, rule out malignancy. Risk of this is retained air, worsening distension, perforation. Will use water immersion to minimize this risk and minimal air insufflation. He understands and wishes to proceed. Further recommendations pending the results.   06/13/2023 9:22 AM  [name]  has presented today for surgery, with the diagnosis of Abnormal Ct abdomen.  The various methods of treatment have been discussed with the patient and family. After consideration of risks, benefits and other options for treatment, the patient has consented to  Procedure(s): FLEXIBLE SIGMOIDOSCOPY (N/A) as a surgical intervention.  The patient's history has been reviewed, patient examined, no change in status, stable for surgery.  I have reviewed the patient's chart and labs.  Questions were answered to the patient's satisfaction.     Viviann Spare P Demica Zook

## 2023-06-13 NOTE — Anesthesia Preprocedure Evaluation (Addendum)
Anesthesia Evaluation  Patient identified by MRN, date of birth, ID band Patient awake    Reviewed: Allergy & Precautions, NPO status , Patient's Chart, lab work & pertinent test results  Airway Mallampati: II       Dental  (+) Dental Advisory Given, Missing, Poor Dentition   Pulmonary Current Smoker and Patient abstained from smoking.   Pulmonary exam normal breath sounds clear to auscultation       Cardiovascular hypertension, Pt. on medications Normal cardiovascular exam Rhythm:Regular Rate:Normal     Neuro/Psych  PSYCHIATRIC DISORDERS   Bipolar Disorder   negative neurological ROS     GI/Hepatic Neg liver ROS,,,Abnormal CT of abdomen   Endo/Other  diabetes, Well Controlled, Type 2, Oral Hypoglycemic Agents  Obesity HLD  Renal/GU Renal disease  negative genitourinary   Musculoskeletal  (+) Arthritis , Osteoarthritis,    Abdominal  (+) + obese  Peds  Hematology negative hematology ROS (+)   Anesthesia Other Findings   Reproductive/Obstetrics                             Anesthesia Physical Anesthesia Plan  ASA: 3  Anesthesia Plan: MAC   Post-op Pain Management: Minimal or no pain anticipated   Induction: Intravenous  PONV Risk Score and Plan: 1 and Treatment may vary due to age or medical condition and Propofol infusion  Airway Management Planned: Natural Airway, Nasal Cannula and Simple Face Mask  Additional Equipment:   Intra-op Plan:   Post-operative Plan:   Informed Consent: I have reviewed the patients History and Physical, chart, labs and discussed the procedure including the risks, benefits and alternatives for the proposed anesthesia with the patient or authorized representative who has indicated his/her understanding and acceptance.     Dental advisory given  Plan Discussed with: CRNA and Anesthesiologist  Anesthesia Plan Comments:         Anesthesia  Quick Evaluation

## 2023-06-13 NOTE — Progress Notes (Signed)
BG was 68 per PACU nurse. Dextrose was given then he was sent to unit. Recheck was 121. Attending team was made aware. No IVF dextrose at this time. Patient remains NPO. Monitoring Bgs.

## 2023-06-13 NOTE — Progress Notes (Signed)
Patient ID: Bradley [name], male   DOB: April 12, 1951, 72 y.o.   MRN: [id number] [facility] Surgery Progress Note  Day of Surgery  Subjective: CC-  Back from flex sig which showed a benign-appearing, intrinsic severe stenosis in the proximal sigmoid colon to distal descending colon and was non- traversed. No bowel function. Denies n/v.  Objective: Vital signs in last 24 hours: Temp:  [97.3 F (36.3 C)-99 F (37.2 C)] 97.5 F (36.4 C) (10/24 1030) Pulse Rate:  [62-77] 62 (10/24 1030) Resp:  [17-21] 21 (10/24 1030) BP: (117-153)/(46-75) 130/52 (10/24 1030) SpO2:  [91 %-98 %] 93 % (10/24 1030) Weight:  [109.8 kg] 109.8 kg (10/24 0900) Last BM Date : 06/04/23 (per pt repory)  Intake/Output from previous day: 10/23 0701 - 10/24 0700 In: 0  Out: 602 [Urine:601; Stool:1] Intake/Output this shift: Total I/O In: 600 [I.V.:600] Out: -   PE: Gen:  Alert, NAD, pleasant Pulm: rate and effort normal Abd- protuberant but soft, nontender  Lab Results:  Recent Labs    06/12/23 0347 06/13/23 0839  WBC 5.8 6.4  HGB 14.0 14.0  HCT 44.0 43.9  PLT 163 165   BMET Recent Labs    06/12/23 0347 06/13/23 0839  NA 136 138  K 5.0 4.7  CL 102 105  CO2 21* 23  GLUCOSE 88 89  BUN 57* 37*  CREATININE 1.74* 1.35*  CALCIUM 8.4* 8.9   PT/INR No results for input(s): "LABPROT", "INR" in the last 72 hours. CMP     Component Value Date/Time   NA 138 06/13/2023 0839   NA 139 06/17/2013 1114   K 4.7 06/13/2023 0839   K 4.2 06/17/2013 1114   CL 105 06/13/2023 0839   CO2 23 06/13/2023 0839   CO2 21 (L) 06/17/2013 1114   GLUCOSE 89 06/13/2023 0839   GLUCOSE 126 06/17/2013 1114   BUN 37 (H) 06/13/2023 0839   BUN 12.9 06/17/2013 1114   CREATININE 1.35 (H) 06/13/2023 0839   CREATININE 1.1 06/17/2013 1114   CALCIUM 8.9 06/13/2023 0839   CALCIUM 9.6 06/17/2013 1114   PROT 8.2 (H) 06/11/2023 1023   PROT 8.8 (H) 06/17/2013 1114   ALBUMIN 3.7 06/11/2023 1023   ALBUMIN 3.6  06/17/2013 1114   AST 12 (L) 06/11/2023 1023   AST 7 06/17/2013 1114   ALT 19 06/11/2023 1023   ALT 8 06/17/2013 1114   ALKPHOS 38 06/11/2023 1023   ALKPHOS 53 06/17/2013 1114   BILITOT 0.8 06/11/2023 1023   BILITOT 0.23 06/17/2013 1114   GFRNONAA 57 (L) 06/13/2023 0839   GFRAA >60 03/28/2020 1525   Lipase     Component Value Date/Time   LIPASE 24 06/11/2023 1023       Studies/Results: DG Chest 1 View  Result Date: 06/12/2023 CLINICAL DATA:  72 year old male with hypoxia. Obstructing large bowel mass suspected on recent CT Abdomen and Pelvis . EXAM: CHEST  1 VIEW COMPARISON:  CT Abdomen and Pelvis yesterday, and earlier. FINDINGS: Portable AP semi upright view at 0501 hours. Somewhat low lung volumes stable from the CT yesterday. Heart size and mediastinal contours remain within normal limits. No pneumothorax or pneumoperitoneum identified. Chronic blunting of the left lateral costophrenic angle. No pulmonary edema or acute pulmonary opacity. No acute osseous abnormality identified. IMPRESSION: Low lung volumes, otherwise no acute cardiopulmonary abnormality. Electronically Signed   By: Odessa Fleming M.D.   On: 06/12/2023 07:25   CT ABDOMEN PELVIS WO CONTRAST  Result Date: 06/11/2023 CLINICAL DATA:  Several day  history of diffuse abdominal pain and constipation. * Tracking Code: BO * EXAM: CT ABDOMEN AND PELVIS WITHOUT CONTRAST TECHNIQUE: Multidetector CT imaging of the abdomen and pelvis was performed following the standard protocol without IV contrast. RADIATION DOSE REDUCTION: This exam was performed according to the departmental dose-optimization program which includes automated exposure control, adjustment of the mA and/or kV according to patient size and/or use of iterative reconstruction technique. COMPARISON:  Same-day abdominal radiograph, CT abdomen and pelvis dated 12/27/2016, MR abdomen dated 03/28/2021 FINDINGS: Lower chest: Left lower lobe nodule measures 5 x 2 mm (5:13),  unchanged. No specific follow-up imaging recommended. No pleural effusion or pneumothorax demonstrated. Left atrial enlargement. Coronary artery calcifications. Hepatobiliary: No focal hepatic lesions. No intra or extrahepatic biliary ductal dilation. Normal gallbladder. Pancreas: No focal lesions or main ductal dilation. Spleen: Normal in size without focal abnormality. Adrenals/Urinary Tract: No adrenal nodules. No calculi or hydronephrosis. Multifocal bilateral exophytic lesions, many of which appear mildly hyperattenuating, correspond to simple and minimally complex cysts on prior MRI. No specific follow-up imaging recommended. No focal bladder wall thickening. Stomach/Bowel: Normal appearance of the stomach. Diffuse small and large bowel dilation to the level of the distal descending colon, where there is a lobulated intraluminal density (3:47). Extensive colonic diverticulosis. Normal appendix. Vascular/Lymphatic: Aortic atherosclerosis. Bilateral common iliac arteries measure 1.9 cm. No enlarged abdominal or pelvic lymph nodes. Reproductive: Enlargement of the prostate with median lobe hypertrophy. Prostate fiducials in-situ. Other: No free fluid, fluid collection, or free air. Musculoskeletal: No acute or abnormal lytic or blastic osseous lesions. IMPRESSION: 1. Diffuse small and large bowel dilation to the level of the distal descending colon, where there is a lobulated intraluminal density, suspicious for colonic mass. Recommend colonoscopy for further evaluation. 2. Extensive colonic diverticulosis without evidence of acute diverticulitis. 3. Bilateral common iliac arteries measure 1.9 cm. 4. Aortic Atherosclerosis (ICD10-I70.0). Coronary artery calcifications. Assessment for potential risk factor modification, dietary therapy or pharmacologic therapy may be warranted, if clinically indicated. Electronically Signed   By: Agustin Cree M.D.   On: 06/11/2023 18:23    Anti-infectives: Anti-infectives (From  admission, onward)    None        Assessment/Plan Sigmoid/descending colon stricture  - CT scan shows diffuse small and large bowel dilation to the level of the distal descending colon where there is a lobulated intraluminal density suspicious for colonic mass - s/p flex sig: benign-appearing, intrinsic severe stenosis in the proximal sigmoid colon to distal descending colon and was non- traversed - CEA pending - Will review case with colorectal surgeons to see if this is something they could do on the robot. Check prealbumin.   FEN - NPO VTE - lovenox ID - none Admit - TRH service    AKI HTN HLD DM Bipolar H/o prostate cancer s/p radiotherapy (2021)   I reviewed Consultant gastroenterology notes, last 24 h vitals and pain scores, last 48 h intake and output, last 24 h labs and trends, and last 24 h imaging results.    LOS: 2 days    Franne Forts, [facility] Surgery 06/13/2023, 11:45 AM Please see Amion for pager number during day hours 7:00am-4:30pm

## 2023-06-13 NOTE — Anesthesia Postprocedure Evaluation (Signed)
Anesthesia Post Note  Patient: Bradley [name]  Procedure(s) Performed: FLEXIBLE SIGMOIDOSCOPY SUBMUCOSAL TATTOO INJECTION     Patient location during evaluation: PACU Anesthesia Type: MAC Level of consciousness: awake and alert and oriented Pain management: pain level controlled Vital Signs Assessment: post-procedure vital signs reviewed and stable Respiratory status: spontaneous breathing, nonlabored ventilation and respiratory function stable Cardiovascular status: stable and blood pressure returned to baseline Postop Assessment: no apparent nausea or vomiting Anesthetic complications: no   No notable events documented.  Last Vitals:  Vitals:   06/13/23 1015 06/13/23 1030  BP: (!) 122/46 (!) 130/52  Pulse: 65 62  Resp: (!) 21 (!) 21  Temp:  (!) 36.4 C  SpO2: 95% 93%    Last Pain:  Vitals:   06/13/23 1030  TempSrc:   PainSc: 0-No pain                 Laverne Klugh A.

## 2023-06-13 NOTE — Assessment & Plan Note (Addendum)
Pt with no BM/flatus x 1 week. Found to have severe stricture in proximal sigmoid colon on sigmoidoscopy. Likely benign/diverticular based on 2020 colonoscopy results. --general surgery to evaluate for resection --remain NPO with IVF and PRN dextrose gel pending surgical plan

## 2023-06-13 NOTE — Op Note (Signed)
[facility] Patient Name: Bradley [name] Procedure Date : 06/13/2023 MRN: [id number] Attending MD: Willaim Rayas. Adela Lank , MD, [id number] Date of Birth: [birth year] CSN: [id number] Age: 72 Admit Type: Inpatient Procedure:                Flexible Sigmoidoscopy Indications:              Abnormal CT of the GI tract - colonic obstruction                            with ? mass lesion in left colon, history of severe                            sigmoid diverticular related stenosis in 2020. Flex                            sig to try and further evaluate, rule out mass                            lesion Providers:                Viviann Spare P. Adela Lank, MD, Fransisca Connors,                            Kandice Robinsons, Technician Referring MD:              Medicines:                Monitored Anesthesia Care Complications:            No immediate complications. Estimated blood loss:                            Minimal. Estimated Blood Loss:     Estimated blood loss was minimal. Procedure:                Pre-Anesthesia Assessment:                           - Prior to the procedure, a History and Physical                            was performed, and patient medications and                            allergies were reviewed. The patient's tolerance of                            previous anesthesia was also reviewed. The risks                            and benefits of the procedure and the sedation                            options and risks were discussed with the patient.  All questions were answered, and informed consent                            was obtained. Prior Anticoagulants: The patient has                            taken no anticoagulant or antiplatelet agents. ASA                            Grade Assessment: III - A patient with severe                            systemic disease. After reviewing the risks and                             benefits, the patient was deemed in satisfactory                            condition to undergo the procedure.                           After obtaining informed consent, the scope was                            passed under direct vision. The [id number]-HQ190TL                            ([id number] Olympus peds colonoscope was introduced                            through the anus and advanced to the the sigmoid                            colon. The flexible sigmoidoscopy was accomplished                            without difficulty. The patient tolerated the                            procedure well. The quality of the bowel                            preparation was fair. Scope In: Scope Out: Findings:      The perianal and digital rectal examinations were normal.      Multiple small-mouthed diverticula were found in the sigmoid colon.      A benign-appearing, intrinsic severe stenosis was found in the proximal       sigmoid colon to distal descending colon and was non-traversed (located       roughly 50-55cm from the anal verge). The area is quite restricted,       limited mobility, could not traverse it or visualize within or above it,       with the pediatric colonoscope. Area distal to the stricture was       tattooed with an injection  of Spot (carbon black).      Few small sessile polyps were found in the sigmoid colon - they appeared       inflammatory / benign, not removed given fair prep and this exam was       done almost exclusively using water immersion to reduce risk for air       trapping proximal to the stricture.      Internal hemorrhoids were found during retroflexion.      The exam was otherwise without abnormality. Impression:               - Preparation of the colon was fair.                           - Diverticulosis in the sigmoid colon.                           - Severe stricture in the proximal sigmoid colon -                            I suspect diverticular  inflammatory although could                            not see within it or traverse it. Tattooed distally.                           - Few small polyps in the sigmoid colon - not                            removed as outlined above                           - Internal hemorrhoids.                           - The examination was otherwise normal. Exam done                            almost entirely with water immersion.                           Overall, severe stricture in the sigmoid colon                            which cannot be traversed endoscopically - based on                            the results of his prior colonoscopy in 2020 this                            is very likely a diverticular / benign stricture,                            while malignancy seems less likely. Recommendation:           - Return patient to hospital ward for  ongoing care.                           - NPO.                           - Continue present medications.                           - Surgical evaluation to discuss resection. I will                            discuss this case with them.                           - No good medical options from GI standpoint, I do                            not think he would tolerate a bowel prep given                            findings - he has not passed anything per rectum in                            about a week Procedure Code(s):        --- Professional ---                           [phone], Sigmoidoscopy, flexible; with directed                            submucosal injection(s), any substance Diagnosis Code(s):        --- Professional ---                           K64.8, Other hemorrhoids                           K56.699, Other intestinal obstruction unspecified                            as to partial versus complete obstruction                           D12.5, Benign neoplasm of sigmoid colon                           K57.30, Diverticulosis of large  intestine without                            perforation or abscess without bleeding                           R93.3, Abnormal findings on diagnostic imaging of  other parts of digestive tract CPT copyright 2022 American Medical Association. All rights reserved. The codes documented in this report are preliminary and upon coder review may  be revised to meet current compliance requirements. Viviann Spare P. Kavish Lafitte, MD 06/13/2023 10:15:22 AM This report has been signed electronically. Number of Addenda: 0

## 2023-06-13 NOTE — Progress Notes (Signed)
Pt blood glucose at 0200 was 59, Dextrose IV  given per protocol. Rechecked pt blood glucose and it was 129. On call provider notified and aware. At 0441, pt CBG was 100.

## 2023-06-13 NOTE — Plan of Care (Signed)

## 2023-06-13 NOTE — Progress Notes (Addendum)
Daily Progress Note Intern Pager: [phone]  Patient name: Bradley [name] Medical record number: [id number] Date of birth: Jan 25, 1951 Age: 72 y.o. Gender: male  Primary Care Provider: Medicine, [facility]: GI Code Status: Full  Pt Overview and Major Events to Date:  10/22: admitted to FMTS  Assessment and Plan: Bradley [name] is a 72 y.o. male presenting with constipation and AKI. Cr significantly improved with fluids so appears likely that this was prerenal in the setting of poor PO intake and abdominal discomfort from constipation.   Also with 1 week of constipation likely due to a severe stricture in the sigmoid colon found on flexible sigmoidoscopy. Pending general surgery evaluation for likely resection.  Assessment & Plan Colonic stricture (HCC) Pt with no BM/flatus x 1 week. Found to have severe stricture in proximal sigmoid colon on sigmoidoscopy. Likely benign/diverticular based on 2020 colonoscopy results. --general surgery to evaluate for resection --remain NPO with IVF and PRN dextrose gel pending surgical plan AKI (acute kidney injury) (HCC) (Resolved: 06/13/2023) Creatinine on admission 3.07 and improved to 1.35 with IVF. Likely prerenal etiology. TSH, A1c WNL. No needs per PT/OT. --continue IVF - Strict I/Os - Hold home lisinopril + HCTZ    Chronic and Stable Issues: Hypertension: Hydrochlorothiazide 25 mg daily, lisinopril 40 mg daily, amlodipine 10 mg daily.  Patient is not taking home Bystolic 10mg  although on med list.  DM2: A1c 7.1, not on medication. CBGs 4xdaily and SSI.  Bipolar disorder II: Risperidone 2 mg nightly and home depakote 500mg  BID  FEN/GI: NPO with IVF and PRN dextrose gel PPx: lovenox Dispo: pending clinical improvement  Subjective:  Pt states he is feeling okay after the sigmoidoscopy. States his belly feels tight but is not painful. Aware of the plan for potential surgery to remove the  stricture.  Had low blood glucose reading to 59 overnight, improved to 129 with IV dextrose.  Objective: Temp:  [98.2 F (36.8 C)-99 F (37.2 C)] 99 F (37.2 C) (10/24 0536) Pulse Rate:  [64-71] 70 (10/24 0536) Resp:  [18] 18 (10/24 0536) BP: (117-133)/(56-69) 133/69 (10/24 0536) SpO2:  [91 %-93 %] 91 % (10/24 0536) Physical Exam: Gen: older male sitting up in bed, pleasant and cooperative with exam, in NAD CV: RRR, normal S1/S2 Pulm: CTAB, normal WOB on RA Abd: decreased bowel sounds, distended, tense, nontender to palpation  Laboratory: Most recent CBC Lab Results  Component Value Date   WBC 5.8 06/12/2023   HGB 14.0 06/12/2023   HCT 44.0 06/12/2023   MCV 92.8 06/12/2023   PLT 163 06/12/2023   Most recent BMP    Latest Ref Rng & Units 06/12/2023    3:47 AM  BMP  Glucose 70 - 99 mg/dL 88   BUN 8 - 23 mg/dL 57   Creatinine 2.44 - 1.24 mg/dL 0.10   Sodium 272 - 536 mmol/L 136   Potassium 3.5 - 5.1 mmol/L 5.0   Chloride 98 - 111 mmol/L 102   CO2 22 - 32 mmol/L 21   Calcium 8.9 - 10.3 mg/dL 8.4     Alessia Gonsalez, Tacey Ruiz, MD 06/13/2023, 8:19 AM  PGY-1, [facility] FPTS Intern pager: [phone], text pages welcome Secure chat group [facility] Teaching Service

## 2023-06-13 NOTE — Assessment & Plan Note (Addendum)
Creatinine on admission 3.07 and improved to 1.35 with IVF. Likely prerenal etiology. TSH, A1c WNL. No needs per PT/OT. --continue IVF - Strict I/Os - Hold home lisinopril + HCTZ

## 2023-06-14 ENCOUNTER — Inpatient Hospital Stay (HOSPITAL_COMMUNITY): Payer: Medicare HMO | Admitting: Certified Registered Nurse Anesthetist

## 2023-06-14 ENCOUNTER — Inpatient Hospital Stay (HOSPITAL_COMMUNITY): Admission: RE | Admit: 2023-06-14 | Payer: Medicare HMO | Source: Home / Self Care | Admitting: Surgery

## 2023-06-14 ENCOUNTER — Encounter (HOSPITAL_COMMUNITY)
Admission: EM | Disposition: A | Discharge status: Skilled Nursing Facility | Payer: Self-pay | Source: Home / Self Care | Attending: Internal Medicine

## 2023-06-14 ENCOUNTER — Encounter (HOSPITAL_COMMUNITY): Payer: Self-pay | Admitting: Student

## 2023-06-14 ENCOUNTER — Other Ambulatory Visit: Payer: Self-pay

## 2023-06-14 DIAGNOSIS — E1122 Type 2 diabetes mellitus with diabetic chronic kidney disease: Secondary | ICD-10-CM | POA: Diagnosis not present

## 2023-06-14 DIAGNOSIS — I129 Hypertensive chronic kidney disease with stage 1 through stage 4 chronic kidney disease, or unspecified chronic kidney disease: Secondary | ICD-10-CM

## 2023-06-14 DIAGNOSIS — N189 Chronic kidney disease, unspecified: Secondary | ICD-10-CM

## 2023-06-14 DIAGNOSIS — K56609 Unspecified intestinal obstruction, unspecified as to partial versus complete obstruction: Secondary | ICD-10-CM

## 2023-06-14 DIAGNOSIS — K56600 Partial intestinal obstruction, unspecified as to cause: Secondary | ICD-10-CM | POA: Diagnosis not present

## 2023-06-14 DIAGNOSIS — F17210 Nicotine dependence, cigarettes, uncomplicated: Secondary | ICD-10-CM

## 2023-06-14 HISTORY — PX: COLON RESECTION: SHX5231

## 2023-06-14 LAB — BLOOD GAS, ARTERIAL
Acid-base deficit: 0.9 mmol/L (ref 0.0–2.0)
Bicarbonate: 25.8 mmol/L (ref 20.0–28.0)
Drawn by: 20012
FIO2: 80 %
MECHVT: 550 mL
O2 Content: 80 L/min
O2 Saturation: 98.4 %
PEEP: 5 cmH2O
Patient temperature: 37
RATE: 18 {breaths}/min
pCO2 arterial: 50 mm[Hg] — ABNORMAL HIGH (ref 32–48)
pH, Arterial: 7.32 — ABNORMAL LOW (ref 7.35–7.45)
pO2, Arterial: 112 mm[Hg] — ABNORMAL HIGH (ref 83–108)

## 2023-06-14 LAB — BASIC METABOLIC PANEL
Anion gap: 12 (ref 5–15)
BUN: 35 mg/dL — ABNORMAL HIGH (ref 8–23)
CO2: 22 mmol/L (ref 22–32)
Calcium: 8.9 mg/dL (ref 8.9–10.3)
Chloride: 104 mmol/L (ref 98–111)
Creatinine, Ser: 1.37 mg/dL — ABNORMAL HIGH (ref 0.61–1.24)
GFR, Estimated: 56 mL/min — ABNORMAL LOW (ref >60)
Glucose, Bld: 95 mg/dL (ref 70–99)
Potassium: 5.1 mmol/L (ref 3.5–5.1)
Sodium: 138 mmol/L (ref 135–145)

## 2023-06-14 LAB — GLUCOSE, CAPILLARY
Glucose-Capillary: 109 mg/dL — ABNORMAL HIGH (ref 70–99)
Glucose-Capillary: 164 mg/dL — ABNORMAL HIGH (ref 70–99)
Glucose-Capillary: 89 mg/dL (ref 70–99)
Glucose-Capillary: 92 mg/dL (ref 70–99)
Glucose-Capillary: 94 mg/dL (ref 70–99)
Glucose-Capillary: 94 mg/dL (ref 70–99)

## 2023-06-14 LAB — CBC
HCT: 45.9 % (ref 39.0–52.0)
Hemoglobin: 14.8 g/dL (ref 13.0–17.0)
MCH: 29.7 pg (ref 26.0–34.0)
MCHC: 32.2 g/dL (ref 30.0–36.0)
MCV: 92 fL (ref 80.0–100.0)
Platelets: 162 10*3/uL (ref 150–400)
RBC: 4.99 MIL/uL (ref 4.22–5.81)
RDW: 14.6 % (ref 11.5–15.5)
WBC: 6.5 10*3/uL (ref 4.0–10.5)
nRBC: 0 % (ref 0.0–0.2)

## 2023-06-14 LAB — CEA: CEA: 3.6 ng/mL (ref 0.0–4.7)

## 2023-06-14 LAB — TYPE AND SCREEN
ABO/RH(D): O POS
Antibody Screen: NEGATIVE

## 2023-06-14 SURGERY — LAPAROSCOPIC SIGMOID COLON RESECTION
Anesthesia: General

## 2023-06-14 MED ORDER — ONDANSETRON HCL 4 MG/2ML IJ SOLN
INTRAMUSCULAR | Status: DC | PRN
  Administered 2023-06-14: 4 mg via INTRAVENOUS

## 2023-06-14 MED ORDER — ONDANSETRON HCL 4 MG/2ML IJ SOLN
INTRAMUSCULAR | Status: AC
  Filled 2023-06-14: qty 2

## 2023-06-14 MED ORDER — SODIUM CHLORIDE 0.9 % IV SOLN
2.0000 g | Freq: Two times a day (BID) | INTRAVENOUS | Status: DC
  Filled 2023-06-14: qty 2

## 2023-06-14 MED ORDER — FAMOTIDINE 20 MG PO TABS: 20.0000 mg | ORAL_TABLET | Freq: Two times a day (BID) | ORAL | Status: DC

## 2023-06-14 MED ORDER — STERILE WATER FOR IRRIGATION IR SOLN
Status: DC | PRN
  Administered 2023-06-14: 500 mL

## 2023-06-14 MED ORDER — GLYCOPYRROLATE PF 0.2 MG/ML IJ SOSY
PREFILLED_SYRINGE | INTRAMUSCULAR | Status: DC | PRN
  Administered 2023-06-14: .2 mg via INTRAVENOUS

## 2023-06-14 MED ORDER — EPHEDRINE 5 MG/ML INJ
INTRAVENOUS | Status: AC
  Filled 2023-06-14: qty 5

## 2023-06-14 MED ORDER — BUPIVACAINE LIPOSOME 1.3 % IJ SUSP
INTRAMUSCULAR | Status: AC
  Filled 2023-06-14: qty 20

## 2023-06-14 MED ORDER — PROCHLORPERAZINE EDISYLATE 10 MG/2ML IJ SOLN: 5.0000 mg | INTRAMUSCULAR | Status: DC | PRN

## 2023-06-14 MED ORDER — INSULIN ASPART 100 UNIT/ML IJ SOLN: 0.0000 [IU] | INTRAMUSCULAR | Status: DC | PRN

## 2023-06-14 MED ORDER — ROCURONIUM BROMIDE 10 MG/ML (PF) SYRINGE
PREFILLED_SYRINGE | INTRAVENOUS | Status: DC | PRN
  Administered 2023-06-14: 80 mg via INTRAVENOUS

## 2023-06-14 MED ORDER — FENTANYL CITRATE PF 50 MCG/ML IJ SOSY
25.0000 ug | PREFILLED_SYRINGE | INTRAMUSCULAR | Status: DC | PRN
  Administered 2023-06-14 – 2023-06-15 (×3): 100 ug via INTRAVENOUS
  Filled 2023-06-14 (×3): qty 2

## 2023-06-14 MED ORDER — ENSURE PRE-SURGERY PO LIQD
296.0000 mL | Freq: Once | ORAL | Status: DC
  Filled 2023-06-14: qty 296

## 2023-06-14 MED ORDER — PROPOFOL 1000 MG/100ML IV EMUL
INTRAVENOUS | Status: AC
Start: 2023-06-14 — End: ?
  Filled 2023-06-14: qty 100

## 2023-06-14 MED ORDER — PROPOFOL 10 MG/ML IV BOLUS
INTRAVENOUS | Status: DC | PRN
  Administered 2023-06-14: 140 mg via INTRAVENOUS

## 2023-06-14 MED ORDER — PROPOFOL 1000 MG/100ML IV EMUL
0.0000 ug/kg/min | INTRAVENOUS | Status: DC
  Administered 2023-06-14: 15 ug/kg/min via INTRAVENOUS
  Administered 2023-06-14: 5 ug/kg/min via INTRAVENOUS
  Administered 2023-06-15: 15 ug/kg/min via INTRAVENOUS
  Filled 2023-06-14 (×2): qty 100

## 2023-06-14 MED ORDER — LACTATED RINGERS IV SOLN: INTRAVENOUS | Status: DC

## 2023-06-14 MED ORDER — MAGIC MOUTHWASH: 15.0000 mL | Freq: Four times a day (QID) | ORAL | Status: DC | PRN

## 2023-06-14 MED ORDER — FENTANYL CITRATE (PF) 100 MCG/2ML IJ SOLN
INTRAMUSCULAR | Status: DC | PRN
  Administered 2023-06-14 (×2): 100 ug via INTRAVENOUS

## 2023-06-14 MED ORDER — ALVIMOPAN 12 MG PO CAPS
12.0000 mg | ORAL_CAPSULE | ORAL | Status: AC
  Administered 2023-06-14: 12 mg via ORAL
  Filled 2023-06-14 (×2): qty 1

## 2023-06-14 MED ORDER — SODIUM CHLORIDE 0.9 % IV SOLN
2.0000 g | INTRAVENOUS | Status: AC
  Administered 2023-06-14: 2 g via INTRAVENOUS
  Filled 2023-06-14 (×2): qty 2

## 2023-06-14 MED ORDER — GABAPENTIN 100 MG PO CAPS
200.0000 mg | ORAL_CAPSULE | ORAL | Status: AC
  Administered 2023-06-14: 200 mg via ORAL
  Filled 2023-06-14: qty 2

## 2023-06-14 MED ORDER — CHLORHEXIDINE GLUCONATE CLOTH 2 % EX PADS
6.0000 | MEDICATED_PAD | Freq: Every day | CUTANEOUS | Status: DC
  Administered 2023-06-14 – 2023-06-25 (×12): 6 via TOPICAL

## 2023-06-14 MED ORDER — ACETAMINOPHEN 500 MG PO TABS
1000.0000 mg | ORAL_TABLET | ORAL | Status: AC
  Administered 2023-06-14: 1000 mg via ORAL
  Filled 2023-06-14: qty 2

## 2023-06-14 MED ORDER — PHENYLEPHRINE HCL-NACL 20-0.9 MG/250ML-% IV SOLN
INTRAVENOUS | Status: AC
  Filled 2023-06-14: qty 250

## 2023-06-14 MED ORDER — ONDANSETRON HCL 4 MG/2ML IJ SOLN: 4.0000 mg | Freq: Four times a day (QID) | INTRAMUSCULAR | Status: DC | PRN

## 2023-06-14 MED ORDER — FENTANYL CITRATE PF 50 MCG/ML IJ SOSY
25.0000 ug | PREFILLED_SYRINGE | INTRAMUSCULAR | Status: DC | PRN
  Administered 2023-06-15: 25 ug via INTRAVENOUS
  Filled 2023-06-14: qty 1

## 2023-06-14 MED ORDER — MIDAZOLAM HCL 5 MG/5ML IJ SOLN
INTRAMUSCULAR | Status: DC | PRN
  Administered 2023-06-14: 2 mg via INTRAVENOUS

## 2023-06-14 MED ORDER — PHENYLEPHRINE 80 MCG/ML (10ML) SYRINGE FOR IV PUSH (FOR BLOOD PRESSURE SUPPORT)
PREFILLED_SYRINGE | INTRAVENOUS | Status: AC
  Filled 2023-06-14: qty 10

## 2023-06-14 MED ORDER — PHENYLEPHRINE 80 MCG/ML (10ML) SYRINGE FOR IV PUSH (FOR BLOOD PRESSURE SUPPORT)
PREFILLED_SYRINGE | INTRAVENOUS | Status: DC | PRN
  Administered 2023-06-14: 80 ug via INTRAVENOUS
  Administered 2023-06-14 (×3): 160 ug via INTRAVENOUS

## 2023-06-14 MED ORDER — MIDAZOLAM HCL 2 MG/2ML IJ SOLN
INTRAMUSCULAR | Status: AC
  Filled 2023-06-14: qty 2

## 2023-06-14 MED ORDER — SIMETHICONE 40 MG/0.6ML PO SUSP: 80.0000 mg | Freq: Four times a day (QID) | ORAL | Status: DC | PRN

## 2023-06-14 MED ORDER — METHOCARBAMOL 1000 MG/10ML IJ SOLN: 1000.0000 mg | Freq: Four times a day (QID) | INTRAMUSCULAR | Status: DC | PRN

## 2023-06-14 MED ORDER — DEXAMETHASONE SODIUM PHOSPHATE 10 MG/ML IJ SOLN
INTRAMUSCULAR | Status: AC
  Filled 2023-06-14: qty 1

## 2023-06-14 MED ORDER — SUCCINYLCHOLINE CHLORIDE 200 MG/10ML IV SOSY
PREFILLED_SYRINGE | INTRAVENOUS | Status: DC | PRN
  Administered 2023-06-14: 140 mg via INTRAVENOUS

## 2023-06-14 MED ORDER — CHLORHEXIDINE GLUCONATE 0.12 % MT SOLN
15.0000 mL | Freq: Once | OROMUCOSAL | Status: AC
  Administered 2023-06-14: 15 mL via OROMUCOSAL

## 2023-06-14 MED ORDER — LIDOCAINE 2% (20 MG/ML) 5 ML SYRINGE
INTRAMUSCULAR | Status: DC | PRN
  Administered 2023-06-14: 20 mg via INTRAVENOUS
  Administered 2023-06-14: 1.5 mg/kg/h via INTRAVENOUS

## 2023-06-14 MED ORDER — BUPIVACAINE-EPINEPHRINE 0.25% -1:200000 IJ SOLN
INTRAMUSCULAR | Status: AC
  Filled 2023-06-14: qty 1

## 2023-06-14 MED ORDER — 0.9 % SODIUM CHLORIDE (POUR BTL) OPTIME
TOPICAL | Status: DC | PRN
  Administered 2023-06-14: 500 mL
  Administered 2023-06-14: 1000 mL

## 2023-06-14 MED ORDER — DEXAMETHASONE SODIUM PHOSPHATE 10 MG/ML IJ SOLN
INTRAMUSCULAR | Status: DC | PRN
  Administered 2023-06-14: 5 mg via INTRAVENOUS

## 2023-06-14 MED ORDER — ALBUMIN HUMAN 5 % IV SOLN
INTRAVENOUS | Status: AC
  Filled 2023-06-14: qty 250

## 2023-06-14 MED ORDER — SODIUM CHLORIDE (PF) 0.9 % IJ SOLN
INTRAMUSCULAR | Status: AC
  Filled 2023-06-14: qty 10

## 2023-06-14 MED ORDER — DOCUSATE SODIUM 50 MG/5ML PO LIQD: 100.0000 mg | Freq: Two times a day (BID) | ORAL | Status: DC

## 2023-06-14 MED ORDER — ALBUMIN HUMAN 5 % IV SOLN: INTRAVENOUS | Status: DC | PRN

## 2023-06-14 MED ORDER — ALUM & MAG HYDROXIDE-SIMETH 200-200-20 MG/5ML PO SUSP: 30.0000 mL | Freq: Four times a day (QID) | ORAL | Status: DC | PRN

## 2023-06-14 MED ORDER — EPHEDRINE SULFATE-NACL 50-0.9 MG/10ML-% IV SOSY
PREFILLED_SYRINGE | INTRAVENOUS | Status: DC | PRN
  Administered 2023-06-14: 10 mg via INTRAVENOUS

## 2023-06-14 MED ORDER — PANTOPRAZOLE SODIUM 40 MG IV SOLR
40.0000 mg | INTRAVENOUS | Status: DC
  Administered 2023-06-14 – 2023-06-16 (×2): 40 mg via INTRAVENOUS
  Filled 2023-06-14 (×2): qty 10

## 2023-06-14 MED ORDER — PHENOL 1.4 % MT LIQD: 2.0000 | OROMUCOSAL | Status: DC | PRN

## 2023-06-14 MED ORDER — MENTHOL 3 MG MT LOZG: 1.0000 | LOZENGE | OROMUCOSAL | Status: DC | PRN

## 2023-06-14 MED ORDER — BUPIVACAINE-EPINEPHRINE (PF) 0.25% -1:200000 IJ SOLN
INTRAMUSCULAR | Status: DC | PRN
  Administered 2023-06-14: 70 mL

## 2023-06-14 MED ORDER — SUGAMMADEX SODIUM 200 MG/2ML IV SOLN
INTRAVENOUS | Status: DC | PRN
  Administered 2023-06-14: 400 mg via INTRAVENOUS

## 2023-06-14 MED ORDER — HYDRALAZINE HCL 20 MG/ML IJ SOLN: 10.0000 mg | Freq: Four times a day (QID) | INTRAMUSCULAR | Status: DC | PRN

## 2023-06-14 MED ORDER — NAPHAZOLINE-GLYCERIN 0.012-0.25 % OP SOLN: 1.0000 [drp] | Freq: Four times a day (QID) | OPHTHALMIC | Status: DC | PRN

## 2023-06-14 MED ORDER — VALPROATE SODIUM 100 MG/ML IV SOLN
500.0000 mg | Freq: Two times a day (BID) | INTRAVENOUS | Status: DC
  Administered 2023-06-14 – 2023-06-16 (×4): 500 mg via INTRAVENOUS
  Filled 2023-06-14 (×6): qty 5

## 2023-06-14 MED ORDER — ROCURONIUM BROMIDE 10 MG/ML (PF) SYRINGE
PREFILLED_SYRINGE | INTRAVENOUS | Status: AC
  Filled 2023-06-14: qty 10

## 2023-06-14 MED ORDER — SUCCINYLCHOLINE CHLORIDE 200 MG/10ML IV SOSY
PREFILLED_SYRINGE | INTRAVENOUS | Status: AC
  Filled 2023-06-14: qty 10

## 2023-06-14 MED ORDER — SODIUM CHLORIDE 0.9 % IV SOLN
2.0000 g | Freq: Two times a day (BID) | INTRAVENOUS | Status: AC
  Administered 2023-06-15: 2 g via INTRAVENOUS
  Filled 2023-06-14: qty 2

## 2023-06-14 MED ORDER — SODIUM CHLORIDE 0.9 % IV SOLN
250.0000 mL | INTRAVENOUS | Status: DC
  Administered 2023-06-14: 250 mL via INTRAVENOUS

## 2023-06-14 MED ORDER — FENTANYL CITRATE (PF) 100 MCG/2ML IJ SOLN
INTRAMUSCULAR | Status: AC
  Filled 2023-06-14: qty 2

## 2023-06-14 MED ORDER — BUPIVACAINE LIPOSOME 1.3 % IJ SUSP
20.0000 mL | Freq: Once | INTRAMUSCULAR | Status: DC
  Filled 2023-06-14: qty 20

## 2023-06-14 MED ORDER — POLYETHYLENE GLYCOL 3350 17 G PO PACK
17.0000 g | PACK | Freq: Every day | ORAL | Status: DC
Start: 2023-06-14 — End: 2023-06-14

## 2023-06-14 MED ORDER — GLYCOPYRROLATE 0.2 MG/ML IJ SOLN
INTRAMUSCULAR | Status: AC
  Filled 2023-06-14: qty 1

## 2023-06-14 MED ORDER — LACTATED RINGERS IV BOLUS: 1000.0000 mL | Freq: Three times a day (TID) | INTRAVENOUS | Status: AC | PRN

## 2023-06-14 MED ORDER — NOREPINEPHRINE 4 MG/250ML-% IV SOLN
0.0000 ug/min | INTRAVENOUS | Status: DC
Start: 2023-06-14 — End: 2023-06-15
  Administered 2023-06-14: 2 ug/min via INTRAVENOUS
  Administered 2023-06-15: 5 ug/min via INTRAVENOUS
  Filled 2023-06-14: qty 250

## 2023-06-14 MED ORDER — LACTATED RINGERS IV BOLUS
500.0000 mL | Freq: Once | INTRAVENOUS | Status: AC
  Administered 2023-06-14: 500 mL via INTRAVENOUS

## 2023-06-14 MED ORDER — HYDROMORPHONE HCL 1 MG/ML IJ SOLN: 0.5000 mg | INTRAMUSCULAR | Status: DC | PRN

## 2023-06-14 MED ORDER — NOREPINEPHRINE 4 MG/250ML-% IV SOLN
INTRAVENOUS | Status: AC
  Filled 2023-06-14: qty 250

## 2023-06-14 MED ORDER — SALINE SPRAY 0.65 % NA SOLN: 1.0000 | Freq: Four times a day (QID) | NASAL | Status: DC | PRN

## 2023-06-14 MED ORDER — PROPOFOL 10 MG/ML IV BOLUS
INTRAVENOUS | Status: AC
  Filled 2023-06-14: qty 20

## 2023-06-14 MED ORDER — PHENYLEPHRINE HCL-NACL 20-0.9 MG/250ML-% IV SOLN
INTRAVENOUS | Status: DC | PRN
  Administered 2023-06-14: 25 ug/min via INTRAVENOUS

## 2023-06-14 MED ORDER — PROCHLORPERAZINE MALEATE 5 MG PO TABS: 5.0000 mg | ORAL_TABLET | Freq: Four times a day (QID) | ORAL | Status: DC | PRN

## 2023-06-14 SURGICAL SUPPLY — 82 items
APL PRP STRL LF DISP 70% ISPRP (MISCELLANEOUS) ×1
APPLIER CLIP 5 13 M/L LIGAMAX5 (MISCELLANEOUS)
APPLIER CLIP ROT 10 11.4 M/L (STAPLE)
APR CLP MED LRG 11.4X10 (STAPLE)
APR CLP MED LRG 5 ANG JAW (MISCELLANEOUS)
BAG COUNTER SPONGE SURGICOUNT (BAG) IMPLANT
BAG SPNG CNTER NS LX DISP (BAG)
CABLE HIGH FREQUENCY MONO STRZ (ELECTRODE) ×2 IMPLANT
CATH MUSHROOM 30FR (CATHETERS) IMPLANT
CELLS DAT CNTRL 66122 CELL SVR (MISCELLANEOUS) IMPLANT
CHLORAPREP W/TINT 26 (MISCELLANEOUS) ×2 IMPLANT
CLIP APPLIE 5 13 M/L LIGAMAX5 (MISCELLANEOUS) IMPLANT
CLIP APPLIE ROT 10 11.4 M/L (STAPLE) IMPLANT
COUNTER NEEDLE 1200 MAGNETIC (NEEDLE) ×2 IMPLANT
COVER MAYO STAND STRL (DRAPES) ×6 IMPLANT
COVER SURGICAL LIGHT HANDLE (MISCELLANEOUS) ×2 IMPLANT
DRAIN CHANNEL 19F RND (DRAIN) IMPLANT
DRAPE LAPAROSCOPIC ABDOMINAL (DRAPES) ×2 IMPLANT
DRAPE SURG IRRIG POUCH 19X23 (DRAPES) ×2 IMPLANT
DRSG OPSITE POSTOP 4X10 (GAUZE/BANDAGES/DRESSINGS) IMPLANT
DRSG OPSITE POSTOP 4X6 (GAUZE/BANDAGES/DRESSINGS) IMPLANT
DRSG OPSITE POSTOP 4X8 (GAUZE/BANDAGES/DRESSINGS) IMPLANT
DRSG TEGADERM 2-3/8X2-3/4 SM (GAUZE/BANDAGES/DRESSINGS) ×4 IMPLANT
DRSG TEGADERM 4X4.75 (GAUZE/BANDAGES/DRESSINGS) IMPLANT
ELECT REM PT RETURN 15FT ADLT (MISCELLANEOUS) ×2 IMPLANT
ENDOLOOP SUT PDS II 0 18 (SUTURE) IMPLANT
EVACUATOR SILICONE 100CC (DRAIN) IMPLANT
GAUZE PACKING IODOFORM 1/4X15 (PACKING) IMPLANT
GAUZE SPONGE 2X2 8PLY STRL LF (GAUZE/BANDAGES/DRESSINGS) ×2 IMPLANT
GAUZE SPONGE 4X4 12PLY STRL (GAUZE/BANDAGES/DRESSINGS) ×2 IMPLANT
GLOVE ECLIPSE 8.0 STRL XLNG CF (GLOVE) ×4 IMPLANT
GLOVE INDICATOR 8.0 STRL GRN (GLOVE) ×4 IMPLANT
GOWN STRL REUS W/ TWL XL LVL3 (GOWN DISPOSABLE) ×10 IMPLANT
GOWN STRL REUS W/TWL XL LVL3 (GOWN DISPOSABLE) ×5
IRRIG SUCT STRYKERFLOW 2 WTIP (MISCELLANEOUS) ×1
IRRIGATION SUCT STRKRFLW 2 WTP (MISCELLANEOUS) ×2 IMPLANT
KIT TURNOVER KIT A (KITS) IMPLANT
LEGGING LITHOTOMY PAIR STRL (DRAPES) IMPLANT
PACK COLON (CUSTOM PROCEDURE TRAY) ×2 IMPLANT
PAD POSITIONING PINK XL (MISCELLANEOUS) ×2 IMPLANT
PENCIL SMOKE EVACUATOR (MISCELLANEOUS) IMPLANT
PROTECTOR NERVE ULNAR (MISCELLANEOUS) IMPLANT
RELOAD GRN CONTOUR (ENDOMECHANICALS) ×1 IMPLANT
RELOAD STAPLE 40 GRN THCK (ENDOMECHANICALS) IMPLANT
RETRACTOR WND ALEXIS 18 MED (MISCELLANEOUS) IMPLANT
RETRACTOR WOUND ALXS 34CM XLRG (MISCELLANEOUS) IMPLANT
RTRCTR WOUND ALEXIS 18CM MED (MISCELLANEOUS)
RTRCTR WOUND ALEXIS 34CM XLRG (MISCELLANEOUS) ×1
SCISSORS LAP 5X35 DISP (ENDOMECHANICALS) ×2 IMPLANT
SEALER TISSUE G2 STRG ARTC 35C (ENDOMECHANICALS) IMPLANT
SET TUBE SMOKE EVAC HIGH FLOW (TUBING) ×2 IMPLANT
SLEEVE ADV FIXATION 5X100MM (TROCAR) IMPLANT
SLEEVE Z-THREAD 5X100MM (TROCAR) IMPLANT
SPIKE FLUID TRANSFER (MISCELLANEOUS) ×2 IMPLANT
STAPLER CVD CUT GN 40 RELOAD (ENDOMECHANICALS) ×1 IMPLANT
STAPLER CVD CUT GRN 40 RELOAD (ENDOMECHANICALS) IMPLANT
STAPLER SKIN PROX WIDE 3.9 (STAPLE) IMPLANT
SURGILUBE 2OZ TUBE FLIPTOP (MISCELLANEOUS) IMPLANT
SUT MNCRL AB 4-0 PS2 18 (SUTURE) ×2 IMPLANT
SUT PDS AB 1 TP1 96 (SUTURE) IMPLANT
SUT PROLENE 0 CT 2 (SUTURE) IMPLANT
SUT PROLENE 2 0 SH DA (SUTURE) IMPLANT
SUT SILK 2 0 (SUTURE) ×1
SUT SILK 2 0 SH CR/8 (SUTURE) ×2 IMPLANT
SUT SILK 2-0 18XBRD TIE 12 (SUTURE) ×2 IMPLANT
SUT SILK 3 0 (SUTURE) ×1
SUT SILK 3 0 SH CR/8 (SUTURE) ×2 IMPLANT
SUT SILK 3-0 18XBRD TIE 12 (SUTURE) ×2 IMPLANT
SUT VIC AB 2-0 SH 18 (SUTURE) IMPLANT
SUT VICRYL 0 UR6 27IN ABS (SUTURE) ×2 IMPLANT
SYS LAPSCP GELPORT 120MM (MISCELLANEOUS)
SYS WOUND ALEXIS 18CM MED (MISCELLANEOUS)
SYSTEM LAPSCP GELPORT 120MM (MISCELLANEOUS) IMPLANT
SYSTEM WOUND ALEXIS 18CM MED (MISCELLANEOUS) IMPLANT
TAPE UMBILICAL 1/8 X36 TWILL (MISCELLANEOUS) ×2 IMPLANT
TOWEL OR 17X26 10 PK STRL BLUE (TOWEL DISPOSABLE) IMPLANT
TOWEL OR NON WOVEN STRL DISP B (DISPOSABLE) ×2 IMPLANT
TRAY FOLEY MTR SLVR 16FR STAT (SET/KITS/TRAYS/PACK) IMPLANT
TROCAR 11X100 Z THREAD (TROCAR) IMPLANT
TROCAR ADV FIXATION 5X100MM (TROCAR) IMPLANT
TROCAR Z-THREAD OPTICAL 5X100M (TROCAR) ×2 IMPLANT
TUBING CONNECTING 10 (TUBING) IMPLANT

## 2023-06-14 NOTE — Transfer of Care (Addendum)
Immediate Anesthesia Transfer of Care Note  Patient: Bradley [name]  Procedure(s) Performed: Procedure(s): EXPLORATORY LAPAROTOMY; TAKEDOWN OF SPLENIC FLEXURE; LEFT HEMICOLECTOMY, COLOSTOMY (N/A)  Patient Location: ICU  Anesthesia Type:General  Level of Consciousness:  sedated, patient cooperative and responds to stimulation  Airway & Oxygen Therapy:Patient Spontanous Breathing and Patient connected to face mask oxgen  Post-op Assessment:  Report given to PACU RN and Post -op Vital signs reviewed and stable  Post vital signs:  Reviewed and stable  Last Vitals:  Vitals:   06/14/23 0927 06/14/23 1015  BP: 127/80 (!) 153/94  Pulse: 79 91  Resp: 18 18  Temp: 37 C   SpO2: 93% 91%    Complications: No apparent anesthesia complications

## 2023-06-14 NOTE — Discharge Summary (Signed)
FMTS Transfer Note  S: Patient states he feels fine but is still bloated. Understands plan to transfer to [facility] for surgery later this morning. Does not have any questions or concerns.  O: BP 125/75 (BP Location: Right Arm)   Pulse 76   Temp 97.8 F (36.6 C) (Oral)   Resp 20   Ht 5\' 8"  (1.727 m)   Wt 109.8 kg   SpO2 93%   BMI 36.81 kg/m    Physical exam Gen: older man laying in bed, pleasant and conversant, in NAD CV: RRR, normal S1/S2 Pulm: CTAB, normal WOB on RA Abd: high pitched bowel sounds, tense, distended, no peritonitis or guarding   A/P: Colonic stricture General surgery recommends laparoscopic L colectomy at [facility] today as procedure not performed at [facility]. [facility] hospitalist accepted transfer and will care for patient after surgery --transfer orders placed by general surgery team --[facility]link to transport pt around 11:00 today --patient signed out to Dr Kirby Crigler, hospitalist at [facility]   Chronic and stable conditions Hypertension: Amlodipine 10 mg daily, holding hydrochlorothiazide 25 mg daily and lisinopril 40 mg daily due to initial AKI and for pre-op.  Patient is not taking home Bystolic 10mg  although on med list.  DM2: A1c 7.1, not on medication. CBGs 4xdaily and SSI.  Bipolar disorder II: Risperidone 2 mg nightly and home depakote 500mg  BID  Lorayne Bender, MD 06/14/2023, 8:13 AM PGY-1, [facility] Family Medicine Service pager [phone]

## 2023-06-14 NOTE — Anesthesia Procedure Notes (Signed)
Procedure Name: Intubation Date/Time: 06/14/2023 2:35 PM  Performed by: Elisabeth Cara, CRNAPre-anesthesia Checklist: Patient identified, Emergency Drugs available, Suction available, Patient being monitored and Timeout performed Patient Re-evaluated:Patient Re-evaluated prior to induction Oxygen Delivery Method: Circle system utilized Preoxygenation: Pre-oxygenation with 100% oxygen Induction Type: IV induction, Rapid sequence and Cricoid Pressure applied Laryngoscope Size: Mac and 4 Grade View: Grade I Tube type: Oral Tube size: 7.5 mm Number of attempts: 1 Airway Equipment and Method: Stylet Placement Confirmation: ETT inserted through vocal cords under direct vision, positive ETCO2 and breath sounds checked- equal and bilateral Secured at: 22 cm Tube secured with: Tape Dental Injury: Teeth and Oropharynx as per pre-operative assessment  Comments: RSI with cricoid pressure by Dr Jean Rosenthal. Grade 1 view. ATOI

## 2023-06-14 NOTE — Progress Notes (Signed)
Called [facility] to reschedule pick up time to 1100 for 1130 arrival to short stay.  Will continue to assess times   Notified PA Hosie Spangle of the change  Evern Bio BSN, Radio producer - Perioperative Services [facility] [phone]

## 2023-06-14 NOTE — Care Plan (Signed)
Called and spoke with Dr. Teena Dunk (hospitalist at [facility]) who will resume care s/p colectomy at [facility].  [facility] has placed transfer order and contacted [facility] for transport around 1100. EMTALA Form completed, will re-assess patient prior to transfer.  Darral Dash, DO

## 2023-06-14 NOTE — Progress Notes (Addendum)
Dr. Michaell Cowing planning partial colectomy this afternoon at [facility].  I spoke with [facility] who will arrive to [facility] around 1100 for patient pick-up. They will bring him to pre-op/short stay. WOC RN to mark patient at [facility] prior to transport I spoke with the teaching service who will get in touch with the hospitalist at [facility] for resumption of care.    Hosie Spangle, PA-C [facility] Please see Amion for pager number during day hours 7:00am-4:30pm

## 2023-06-14 NOTE — Progress Notes (Signed)
06/14/2023  Bradley [name] [id number] Jul 15, 1951  CARE TEAM: PCP: Medicine, [facility]  Outpatient Care Team: Patient Care Team: Medicine, [facility] as PCP - General (Family Medicine) Artis Delay, MD as Consulting Physician (Hematology and Oncology) Armbruster, Willaim Rayas, MD as Consulting Physician (Gastroenterology)  Inpatient Treatment Team: Treatment Team:  Billey Co, MD [facility], Md, MD Karie Soda, MD Jocelyn Lamer, NT Domingo Dimes, RN Heather Roberts, NT Lars Mage, RN Birdena Jubilee, RN Leander Rams, RPH Idamae Schuller, RN Hammons, Gerhard Munch, [place] Hinton Lovely, RN   Problem List:   Principal Problem:   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 Active Problems:   Hyperlipemia   Essential hypertension   Bipolar I disorder (HCC)   Malignant neoplasm of prostate (HCC)   Diabetes mellitus, type II (HCC)   Abnormal CT scan   Stricture of descending colon (HCC)   Schizoaffective disorder, bipolar type (HCC)   Vitamin D deficiency   Severe obesity (BMI 35.0-35.9 with comorbidity) (HCC)   06/13/2023  Procedure(s): FLEXIBLE SIGMOIDOSCOPY SUBMUCOSAL TATTOO INJECTION    Assessment [facility] Stay = 3 days) 1 Day Post-Op    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  The hope was to try and ride this out and try and do an elective resection in the near future but he is rather stand and bloated.  I agree with Dr. Dwain Sarna that I think he needs surgery before discharge.  Will work to try and find time today to do a minimally invasive approach.  May have to transfer the patient most likely to [facility] in the hopes of doing a laparoscopic left colectomy.  Will try and do immediate anastomosis since at least by CAT scan he is not massively distended on his colon.  I did caution  the patient he may need a Hartmann resection and a two-stage procedure.  Will see if get the ostomy nurses to preoperatively mark just in case.  Patient interested in proceeding.  The anatomy & physiology of the digestive tract was discussed.  The pathophysiology of the colon was discussed.  Natural history risks without surgery was discussed.   I feel the risks of no intervention will lead to serious problems that outweigh the operative risks; therefore, I recommended a partial colectomy to remove the pathology.  Minimally invasive (Robotic/Laparoscopic) & open techniques were discussed.   Risks such as bleeding, infection, abscess, leak, reoperation, injury to other organs, need for repair of tissues / organs, possible ostomy, hernia, heart attack, stroke, death, and other risks were discussed.  I noted a good likelihood this will help address the problem.   Goals of post-operative recovery were discussed as well.   Need for adequate nutrition, daily bowel regimen and healthy physical activity, to optimize recovery was noted as well. We will work to minimize complications.  Educational materials were available as well.  Questions were answered.  The patient expresses understanding & wishes to proceed with surgery.   -VTE prophylaxis- SCDs, etc -mobilize as tolerated to help recovery -Disposition: TBD      I reviewed nursing notes, Consultant GI notes, hospitalist notes, last 24 h vitals and pain scores, last 48 h intake and output, last 24 h labs and trends, and last 24 h imaging results.  I have reviewed this patient's available data, including medical history, events of  note, test results, etc as part of my evaluation.   A significant portion of that time was spent in counseling. Care during the described time interval was provided by me.  This care required moderate level of medical decision making.  06/14/2023    Subjective: (Chief complaint)  With patient having some gas but still  feeling bloated.  No nausea or vomiting.  Events noted with flexible sigmoidoscopy confirming stricture.  Objective:  Vital signs:  Vitals:   06/13/23 1030 06/13/23 1701 06/13/23 2055 06/14/23 0550  BP: (!) 130/52 (!) 130/58 135/72 125/75  Pulse: 62  63 76  Resp: (!) 21 18 20 20   Temp: (!) 97.5 F (36.4 C) 98.2 F (36.8 C) 98.3 F (36.8 C) 97.8 F (36.6 C)  TempSrc:   Oral Oral  SpO2: 93% (!) 85% 94% 93%  Weight:      Height:        Last BM Date : 06/04/23 (per pt repory)  Intake/Output   Yesterday:  10/24 0701 - 10/25 0700 In: 600 [I.V.:600] Out: 4 [Urine:4] This shift:  No intake/output data recorded.  Bowel function:  Flatus: YES  BM:  No  Drain: (No drain)   Physical Exam:  General: Pt awake/alert in no acute distress Eyes: PERRL, normal EOM.  Sclera clear.  No icterus Neuro: CN II-XII intact w/o focal sensory/motor deficits. Lymph: No head/neck/groin lymphadenopathy Psych:  No delerium/psychosis/paranoia.  Oriented x 4 HENT: Normocephalic, Mucus membranes moist.  No thrush Neck: Supple, No tracheal deviation.  No obvious thyromegaly Chest: No pain to chest wall compression.  Good respiratory excursion.  No audible wheezing CV:  Pulses intact.  Regular rhythm.  No major extremity edema MS: Normal AROM mjr joints.  No obvious deformity  Abdomen: This.  Somewhat firm.  Moderately distended.  Nontender.  No evidence of peritonitis.  No incarcerated hernias.  Ext:   No deformity.  No mjr edema.  No cyanosis Skin: No petechiae / purpurea.  No major sores.  Warm and dry    Results:   Cultures: No results found for this or any previous visit (from the past 720 hour(s)).  Labs: Results for orders placed or performed during the hospital encounter of 06/11/23 (from the past 48 hour(s))  Glucose, capillary     Status: None   Collection Time: 06/12/23  8:17 AM  Result Value Ref Range   Glucose-Capillary 91 70 - 99 mg/dL    Comment: Glucose reference  range applies only to samples taken after fasting for at least 8 hours.  Glucose, capillary     Status: Abnormal   Collection Time: 06/12/23 12:09 PM  Result Value Ref Range   Glucose-Capillary 103 (H) 70 - 99 mg/dL    Comment: Glucose reference range applies only to samples taken after fasting for at least 8 hours.  Glucose, capillary     Status: None   Collection Time: 06/12/23  4:43 PM  Result Value Ref Range   Glucose-Capillary 88 70 - 99 mg/dL    Comment: Glucose reference range applies only to samples taken after fasting for at least 8 hours.  Glucose, capillary     Status: Abnormal   Collection Time: 06/12/23  9:07 PM  Result Value Ref Range   Glucose-Capillary 64 (L) 70 - 99 mg/dL    Comment: Glucose reference range applies only to samples taken after fasting for at least 8 hours.  Glucose, capillary     Status: None   Collection Time: 06/12/23 11:23 PM  Result Value Ref Range   Glucose-Capillary 75 70 - 99 mg/dL    Comment: Glucose reference range applies only to samples taken after fasting for at least 8 hours.  Glucose, capillary     Status: Abnormal   Collection Time: 06/13/23  2:00 AM  Result Value Ref Range   Glucose-Capillary 59 (L) 70 - 99 mg/dL    Comment: Glucose reference range applies only to samples taken after fasting for at least 8 hours.  Glucose, capillary     Status: Abnormal   Collection Time: 06/13/23  2:38 AM  Result Value Ref Range   Glucose-Capillary 129 (H) 70 - 99 mg/dL    Comment: Glucose reference range applies only to samples taken after fasting for at least 8 hours.  Glucose, capillary     Status: Abnormal   Collection Time: 06/13/23  4:41 AM  Result Value Ref Range   Glucose-Capillary 100 (H) 70 - 99 mg/dL    Comment: Glucose reference range applies only to samples taken after fasting for at least 8 hours.  Basic metabolic panel     Status: Abnormal   Collection Time: 06/13/23  8:39 AM  Result Value Ref Range   Sodium 138 135 - 145 mmol/L    Potassium 4.7 3.5 - 5.1 mmol/L   Chloride 105 98 - 111 mmol/L   CO2 23 22 - 32 mmol/L   Glucose, Bld 89 70 - 99 mg/dL    Comment: Glucose reference range applies only to samples taken after fasting for at least 8 hours.   BUN 37 (H) 8 - 23 mg/dL   Creatinine, Ser 1.61 (H) 0.61 - 1.24 mg/dL   Calcium 8.9 8.9 - 09.6 mg/dL   GFR, Estimated 57 (L) >60 mL/min    Comment: (NOTE) Calculated using the CKD-EPI Creatinine Equation (2021)    Anion gap 10 5 - 15    Comment: Performed at [facility], [address], [place], [place] [address]  CBC     Status: None   Collection Time: 06/13/23  8:39 AM  Result Value Ref Range   WBC 6.4 4.0 - 10.5 K/uL   RBC 4.80 4.22 - 5.81 MIL/uL   Hemoglobin 14.0 13.0 - 17.0 g/dL   HCT 98.1 19.1 - 47.8 %   MCV 91.5 80.0 - 100.0 fL   MCH 29.2 26.0 - 34.0 pg   MCHC 31.9 30.0 - 36.0 g/dL   RDW 29.5 62.1 - 30.8 %   Platelets 165 150 - 400 K/uL   nRBC 0.0 0.0 - 0.2 %    Comment: Performed at [facility], [address] [place], [place] [address]  Prealbumin     Status: Abnormal   Collection Time: 06/13/23  8:39 AM  Result Value Ref Range   Prealbumin 17 (L) 18 - 38 mg/dL    Comment: Performed at [facility], [address] [place], [place] [address]  Glucose, capillary     Status: None   Collection Time: 06/13/23  8:43 AM  Result Value Ref Range   Glucose-Capillary 97 70 - 99 mg/dL    Comment: Glucose reference range applies only to samples taken after fasting for at least 8 hours.  Glucose, capillary     Status: Abnormal   Collection Time: 06/13/23 10:15 AM  Result Value Ref Range   Glucose-Capillary 68 (L) 70 - 99 mg/dL    Comment: Glucose reference range applies only to samples taken after fasting for at least 8  hours.  Glucose, capillary     Status: Abnormal   Collection Time: 06/13/23 11:06 AM  Result Value Ref Range   Glucose-Capillary 121 (H) 70 - 99 mg/dL    Comment: Glucose reference range applies only to samples taken  after fasting for at least 8 hours.  Glucose, capillary     Status: Abnormal   Collection Time: 06/13/23 12:03 PM  Result Value Ref Range   Glucose-Capillary 108 (H) 70 - 99 mg/dL    Comment: Glucose reference range applies only to samples taken after fasting for at least 8 hours.  Glucose, capillary     Status: Abnormal   Collection Time: 06/13/23  5:04 PM  Result Value Ref Range   Glucose-Capillary 103 (H) 70 - 99 mg/dL    Comment: Glucose reference range applies only to samples taken after fasting for at least 8 hours.  Glucose, capillary     Status: None   Collection Time: 06/13/23  8:58 PM  Result Value Ref Range   Glucose-Capillary 77 70 - 99 mg/dL    Comment: Glucose reference range applies only to samples taken after fasting for at least 8 hours.  Glucose, capillary     Status: None   Collection Time: 06/14/23  2:27 AM  Result Value Ref Range   Glucose-Capillary 89 70 - 99 mg/dL    Comment: Glucose reference range applies only to samples taken after fasting for at least 8 hours.  Glucose, capillary     Status: None   Collection Time: 06/14/23  5:47 AM  Result Value Ref Range   Glucose-Capillary 94 70 - 99 mg/dL    Comment: Glucose reference range applies only to samples taken after fasting for at least 8 hours.    Imaging / Studies: No results found.  Medications / Allergies: per chart  Antibiotics: Anti-infectives (From admission, onward)    Start     Dose/Rate Route Frequency Ordered Stop   06/14/23 0845  cefoTEtan (CEFOTAN) 2 g in sodium chloride 0.9 % 100 mL IVPB        2 g 200 mL/hr over 30 Minutes Intravenous On call to O.R. 06/14/23 0745 06/15/23 0559         Note: Portions of this report may have been transcribed using voice recognition software. Every effort was made to ensure accuracy; however, inadvertent computerized transcription errors may be present.   Any transcriptional errors that result from this process are unintentional.    Ardeth Sportsman, MD, FACS, MASCRS Esophageal, Gastrointestinal & Colorectal Surgery Robotic and Minimally Invasive Surgery  [facility] [address], [address] [place], [place] [address] [phone] Fax [phone] Main  CONTACT INFORMATION: Weekday (9AM-5PM): Call [facility] main office at [phone] Weeknight (5PM-9AM) or Weekend/Holiday: Check EPIC "Web Links" tab & use "AMION" (password " [email or web address]") for General Surgery [facility] coverage  Please, DO NOT use SecureChat  (it is not reliable communication to reach operating surgeons & will lead to a delay in care).   Epic staff messaging available for outptient concerns needing 1-2 business day response.      06/14/2023  7:52 AM

## 2023-06-14 NOTE — Op Note (Signed)
06/14/2023  5:37 PM  PATIENT:  Bradley [name]  72 y.o. male  Patient Care Team: Medicine, [facility] as PCP - General (Family Medicine) Artis Delay, MD as Consulting Physician (Hematology and Oncology) Armbruster, Willaim Rayas, MD as Consulting Physician (Gastroenterology) Karie Soda, MD as Consulting Physician (Colon and Rectal Surgery)  PRE-OPERATIVE DIAGNOSIS:   PARTIAL COLON OBSTRUCTION DESCENDING COLON OBSTRUCTION  POST-OPERATIVE DIAGNOSIS:   COMPLETE COLON & BOWEL OBSTRUCTION DESCENDING COLON MASS/STRICTURE  PROCEDURE:   HARTMANN PROCEDURE (COLECTOMY WITH END COLECTOMY) MOBILIZATION OF SPLENIC FLEXURE OF COLON DIAGNOSTIC LAPAROSCOPY TRANSVERSUS ABDOMINIS PLANE (TAP) BLOCK - BILATERAL  SURGEON:  Ardeth Sportsman, MD  ASSISTANT:  Angelena Form, MD  An experienced assistant was required given the standard of surgical care given the complexity of the case.  This assistant was needed for exposure, dissection, suction, tissue approximation, retraction, perception, etc  ANESTHESIA:  General endotracheal intubation anesthesia (GETA) and Regional TRANSVERSUS ABDOMINIS PLANE (TAP) nerve block -BILATERAL for perioperative & postoperative pain control at the level of the transverse abdominis & preperitoneal spaces along the flank at the anterior axillary line, from subcostal ridge to iliac crest under laparoscopic guidance provided with liposomal bupivacaine (Experel) 20mL mixed with 50 mL of bupivicaine 0.25% with epinephrine  Estimated Blood Loss (EBL):   Total I/O In: 1600 [I.V.:1000; IV Piggyback:600] Out: 4550 [Urine:100; Other:4400; Blood:50].   (See anesthesia record)  Delay start of Pharmacological VTE agent (>24hrs) due to concerns of significant anemia, surgical blood loss, or risk of bleeding?:  no  DRAINS: 30 French Pezzer tube and end colostomy to help colon decompressed.  It can be removed this weekend or at least by Monday 1028  SPECIMEN:  Colon -  distal left   DISPOSITION OF SPECIMEN:  Pathology  COUNTS:  Sponge, needle, & instrument counts CORRECT  PLAN OF CARE: Admit to inpatient   PATIENT DISPOSITION:  ICU - intubated and hemodynamically stable.  INDICATION:    Obese elderly male followed by [facility] with concerns of stricture of descending colon.  Became more bloated and concern for obstruction.  Admitted.  Distal ascending colon mass noted.  Flexible sigmoidoscopy after enemas attempted.  Mass seemed more benign.  Patient acting obstipated.  Recommendation made for laparoscopic possible open surgical resection and possible colostomy.    The anatomy & physiology of the digestive tract was discussed.  The pathophysiology was discussed.  Natural history risks without surgery was discussed.   I worked to give an overview of the disease and the frequent need to have multispecialty involvement.  I feel the risks of no intervention will lead to serious problems that outweigh the operative risks; therefore, I recommended a partial colectomy to remove the pathology.  Laparoscopic & open techniques were discussed.    Risks such as bleeding, infection, abscess, leak, reoperation, possible ostomy, hernia, heart attack, death, and other risks were discussed.  I noted a good likelihood this will help address the problem.   Goals of post-operative recovery were discussed as well.  We will work to minimize complications.  Educational materials on the pathology had been given in the office.  Questions were answered.    The patient expressed understanding & wished to proceed with surgery.  OR FINDINGS:   Patient had massively dilated small bowel stomach and especially colon with a transition point in the distal descending colon with thickening and stricture suspicious more for malignancy but possibly diverticular.   No obvious metastatic disease on visceral parietal peritoneum or liver.  No  perforation or abscess.  Hartmann resection  done requiring splenic flexure mobilization to get into reach to the left upper quadrant at premarked location.  2 L of feculent gastric contents aspirated through a nasogastric tube.  Additional 3 L aspirated out dilated colon to decompress bowel and colon.   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  Has a yellow 30 French Pezzer tube in the colostomy to help the bowel decompressed.  He can be removed in the next few days  CASE DATA: Type of patient?: LDOW CASE (Surgical Hospitalist [facility] Inpatient) Status of Case? URGENT Add On Infection Present At Time Of Surgery (PATOS)?  PHLEGMON   DESCRIPTION:   Informed consent was confirmed.  The patient underwent general anaesthesia without difficulty.  The patient was positioned appropriately.  VTE prevention in place.  The patient's abdomen was clipped, prepped, & draped in a sterile fashion.  Surgical timeout confirmed our plan.  The patient was positioned in reverse Trendelenburg.  Veress needle placed in the left subcostal region Palmer's point with normal water drop test.  Insufflation to 15 mmHg.  Abdominal entry was gained using optical entry technique in the right upper abdomen.  Entry was clean.  I induced carbon dioxide insufflation.  Camera inspection revealed no injury.  Extra ports were carefully placed under direct laparoscopic visualization.  Upon entering the abdomen (organ space), I encountered massively dilated small bowel.  Visualization was challenging.  Tried to reposition and get the bowel to see the pelvis but I could not get down to the pelvis nor see the left colon.  Small bowel was somewhat sickly but not necrotic.  Given the massive distention, I felt I could not safely continue the case laparoscopically.  Therefore made a low midline incision that later was extended somewhat supraumbilically.  Wound protector placed.  Eviscerated large volume small bowel to better see.  I could palpate a rockhard mass in the distal descending colon as  expected by CAT scan.  We switched from an orogastric tube to a nasogastric tube.  Milked back small bowel contents to better decompressed and visualized.  Thinly feculent return.  Had some spillage into the oropharynx that was aspirated colic.  No evidence of any contents going down the ET tube by endotracheal suction.  No hemodynamic or pulmonary collapse.  Used an open Bookwalter retraction system to better expose.  Freed the ileal mesentery off the rectosigmoid colon as it was stuck together such that the sigmoid colon actually was buried underneath it.  Eventually was able to free the small bowel off the redundant sigmoid colon and follow it to the descending colon stricture.  Again milked some more small bowel contents back and got it out of the abdomen.  With this I could focus on resection.  Elevated the sigmoid colon mesentery and found the window between the left pelvis and the sigmoid colon.  Scored along the left paracolic gutter along the line of Toldt.  Gradually mobilized and elevated the decompressed sigmoid and distal descending colon to the stricture.  The mid and proximal descending colon splenic flexure and transverse colon was massively dilated.  I ended up having to mobilize the splenic flexure of the colon to be able to get the specimen out of his obese peritoneum.  Freed the matted greater omentum off the mid transverse colon and follow that more distally.  Extended the incision more supraumbilically for better exposure.  Eventually by coming from the transverse colon distally in the descending colon proximally I was  able to come around the splenic flexure and mobilized in the superior to inferior fashion carefully using cautery as well as focused bipolar Enseal system.  Eventually got the entire left colon up into the abdomen and eviscerated for better exposure.  Confirmed the stricture mass that had a thickened phlegmon.  Hard to know if this was diverticular.  There was no definite  abscess nor any definite perforation.    Because of the massive distention, I did not feel it is safe to do resection meeting anastomosis and Dr. Cliffton Asters concurred.  Because of the probable diverticular etiology, we had to go down to the rectosigmoid junction to avoid leaving the diverticulosis.  Found the rectosigmoid colon and came through the mesentery at the sacral promontory and transected at the rectosigmoid junction with a green load contour stapler.  Placed 0 Prolene sutures on the rectal stump for later identification in hopes of doing colostomy takedown and 3-6 months.  Transected the sigmoid mesentery more proximally trying to do a somewhat high ligation confirming the ureters and gonadal vessels were in the retroperitoneum well away.  Gradually marched more proximally using the Enseal bipolar system until we came above the IMA and IMV for good ligation and better mobilization.  With that I felt the splenic flexure would be able to come up to the premarked colostomy site.  The colon was still massively dilated.  We did an intentional colotomy 5 cm proximal to the mass and aspirated 3 L of liquid stool & also flatus gradually.  First from the cecum to the descending colon and then milked small bowel contents more distally until it was much better decompressed.  Abdomen is much softer at that point.  We had done this with large towels and gauze surrounding the colon at the aspiration site with the pool tip sucker.  Small bowel was well out of the way.  Scant contamination on colon only.  Used another load of the contour stapler to transect at the proximal descending colon 5 cm proximal to the mass and sent specimen off.  We changed gloves and redraped.  I made a 3 cm diameter circular incision in the left supraumbilical paramedian region at the premarked colostomy site that been marked from the wound ostomy nurse.  Excised a cylinder of ubcutaneous fat & removed it.  Came through the anterior rectus  fascia transversely.  Split through the rectus muscle and came through the posterior rectus fascia and peritoneum vertically.  Dilated to 3 fingers given the massive colon dissection and was able to eviscerate the descending colon for the colostomy.  It looked viable.  We inspected the bowel and noted good hemostasis.  Hemostasis was in the pelvis as well as in the left upper quadrant and retroperitoneum.  We closed the fascia using #1 looped PDS in a running fashion starting inferiorly and then superiorly and meeting in the middle.  Tied that down.  Because he was helically stable with minimal blood loss and it did not notice any massive perforation or purulence I decided to close the skin leaving few 1 cm gaps where I packed with quarter inch Nu Gauze iodoform gauze as a wick to help decompress the subcutaneous tissues.  Ports have been removed and port sites closed with 4 Monocryl.  Sterile dressing applied.  I matured the colostomy using 2-0 silk interrupted sutures in the standard Brooke fashion starting in 4 quadrants and gradually doing interrupted sutures along the circumference to good result.  Resulted in a  2 cm rosebud colostomy.  Mucosa mildly ischemic but definitely viable.  Finger easily intubated with any twisting or torsion.  Because of his massive colon and small bowel distention decided to be progressed that colostomy more aggressively using a 3 French Pezzer that went across the fascia into the descending colon with the long tail in the colostomy bag appliance.  Attempt was made to extubate the patient but he is tachypneic with fair respirations despite being much softer in the abdomen.  Dr. Desmond Lope with anesthesia felt it would be safer to have the patient remain intubated and then attempt extubation the morning.  I called and discussed with Dr. Monica Becton with pulmonary critical care and their team will help follow.  Updated the primary internal medicine team as well to sort out.  Surgery will  continue to follow.  I made an attempt to locate & reach the desired patient contact (Pt's daughter, Tyreak Tally) to discuss the patient's overall status and my recommendations.  No one is available at this time.  My plans & instructions have been written in the chart.   Ardeth Sportsman, MD, FACS, MASCRS Esophageal, Gastrointestinal & Colorectal Surgery Robotic and Minimally Invasive Surgery  [facility] [address], [address] [place], [place] [address] [phone] Fax [phone] Main  CONTACT INFORMATION: Weekday (9AM-5PM): Call [facility] main office at [phone] Weeknight (5PM-9AM) or Weekend/Holiday: Check EPIC "Web Links" tab & use "AMION" (password " [email or web address]") for General Surgery [facility] coverage  Please, DO NOT use SecureChat  (it is not reliable communication to reach operating surgeons & will lead to a delay in care).   Epic staff messaging available for outptient concerns needing 1-2 business day response.

## 2023-06-14 NOTE — Progress Notes (Signed)
   06/14/23 0930  Mobility  Activity Transferred from bed to chair  Level of Assistance Standby assist, set-up cues, supervision of patient - no hands on  Assistive Device None  Distance Ambulated (ft) 5 ft  Activity Response Tolerated fair  Mobility Referral Yes  $Mobility charge 1 Mobility  Mobility Specialist Start Time (ACUTE ONLY) 0907  Mobility Specialist Stop Time (ACUTE ONLY) 0917  Mobility Specialist Time Calculation (min) (ACUTE ONLY) 10 min   Mobility Specialist: Progress Note  Pt agreeable to mobility session - received in bed. Required SB with no AD. C/o stomach bloating and pain rated 8/10 . Returned to chair with all needs met - call bell within reach.   Barnie Mort, BS Mobility Specialist Please contact via SecureChat or Rehab office at [phone].

## 2023-06-14 NOTE — Consult Note (Signed)
WOC consult received for new descending colostomy and to remove Malecot tube with first pouch change.   WOC team plans first postop assessment/teaching visit Monday 06/17/2023.   Thank you,    Priscella Mann MSN, RN-BC, [employer] [phone]

## 2023-06-14 NOTE — Discharge Instructions (Addendum)
#######################################################  Ostomy Support Information  You've heard that people get along just fine with only one of their eyes, or one of their lungs, or one of their kidneys. But you also know that you have only one intestine and only one bladder, and that leaves you feeling awfully empty, both physically and emotionally: You think no other people go around without part of their intestine with the ends of their intestines sticking out through their abdominal walls.   YOU ARE NOT ALONE.  There are nearly three quarters of a million people in the [place] who have an ostomy; people who have had surgery to remove all or part of their colons or bladders.   There is even a national association, the [place] [employer] [place] with over 350 local affiliated support groups that are organized by volunteers who provide peer support and counseling. Cheral Marker has a toll free telephone num-ber, [phone] and an educational, interactive website, www.ostomy.org   An ostomy is an opening in the belly (abdominal wall) made by surgery. Ostomates are people who have had this procedure. The opening (stoma) allows the kidney or bowel to grdischarge waste. An external pouch covers the stoma to collect waste. Pouches are are a simple bag and are odor free. Different companies have disposable or reusable pouches to fit one's lifestyle. An ostomy can either be temporary or permanent.   THERE ARE THREE MAIN TYPES OF OSTOMIES Colostomy. A colostomy is a surgically created opening in the large intestine (colon). Ileostomy. An ileostomy is a surgically created opening in the small intestine. Urostomy. A urostomy is a surgically created opening to divert urine away from the bladder.  OSTOMY Care  The following guidelines will make care of your colostomy easier. Keep this information close by for quick reference.  Helpful DIET hints Eat a well-balanced diet including vegetables and fresh  fruits. Eat on a regular schedule.  Drink at least 6 to 8 glasses of fluids daily. Eat slowly in a relaxed atmosphere. Chew your food thoroughly. Avoid chewing gum, smoking, and drinking from a straw. This will help decrease the amount of air you swallow, which may help reduce gas. Eating yogurt or drinking buttermilk may help reduce gas.  To control gas at night, do not eat after 8 p.m. This will give your bowel time to quiet down before you go to bed.  If gas is a problem, you can purchase Beano. Sprinkle Beano on the first bite of food before eating to reduce gas. It has no flavor and should not change the taste of your food. You can buy Beano over the counter at your local drugstore.  Foods like fish, onions, garlic, broccoli, asparagus, and cabbage produce odor. Although your pouch is odor-proof, if you eat these foods you may notice a stronger odor when emptying your pouch. If this is a concern, you may want to limit these foods in your diet.  If you have an ileostomy, you will have chronic diarrhea & need to drink more liquids to avoid getting dehydrated.  Consider antidiarrheal medicine like imodium (loperamide) or Lomotil to help slow down bowel movements / diarrhea into your ileostomy bag.  GETTING TO GOOD BOWEL HEALTH WITH AN ILEOSTOMY    With the colon bypassed & not in use, you will have small bowel diarrhea.   It is important to thicken & slow your bowel movements down.   The goal: 4-6 small BOWEL MOVEMENTS A DAY It is important to drink plenty of liquids to avoid  getting dehydrated  CONTROLLING ILEOSTOMY DIARRHEA  TAKE A FIBER SUPPLEMENT (FiberCon or Benefiner soluble fiber) twice a day - to thicken stools by absorbing excess fluid and retrain the intestines to act more normally.  Slowly increase the dose over a few weeks.  Too much fiber too soon can backfire and cause cramping & bloating.  TAKE AN IRON SUPPLEMENT twice a day to naturally constipate your bowels.  Usually  ferrous sulfate 325mg  twice a day)  TAKE ANTI-DIARRHEAL MEDICINES: Loperamide (Imodium) can slow down diarrhea.  Start with two tablets (= 4mg ) first and then try one tablet every 6 hours.  Can go up to 2 pills four times day (8 pills of 2mg  max) Avoid if you are having fevers or severe pain.  If you are not better or start feeling worse, stop all medicines and call your doctor for advice LoMotil (Diphenoxylate / Atropine) is another medicine that can constipate & slow down bowel moevements Pepto Bismol (bismuth) can gently thicken bowels as well  If diarrhea is worse,: drink plenty of liquids and try simpler foods for a few days to avoid stressing your intestines further. Avoid dairy products (especially milk & ice cream) for a short time.  The intestines often can lose the ability to digest lactose when stressed. Avoid foods that cause gassiness or bloating.  Typical foods include beans and other legumes, cabbage, broccoli, and dairy foods.  Every person has some sensitivity to other foods, so listen to our body and avoid those foods that trigger problems for you.Call your doctor if you are getting worse or not better.  Sometimes further testing (cultures, endoscopy, X-ray studies, bloodwork, etc) may be needed to help diagnose and treat the cause of the diarrhea. Take extra anti-diarrheal medicines (maximum is 8 pills of 2mg  loperamide a day)   Tips for POUCHING an OSTOMY   Changing Your Pouch The best time to change your pouch is in the morning, before eating or drinking anything. Your stoma can function at any time, but it will function more after eating or drinking.   Applying the pouching system  Place all your equipment close at hand before removing your pouch.  Wash your hands.  Stand or sit in front of a mirror. Use the position that works best for you. Remember that you must keep the skin around the stoma wrinkle-free for a good seal.  Gently remove the used pouch (1-piece  system) or the pouch and old wafer (2-piece system). Empty the pouch into the toilet. Save the closure clip to use again.  Wash the stoma itself and the skin around the stoma. Your stoma may bleed a little when being washed. This is normal. Rinse and pat dry. You may use a wash cloth or soft paper towels (like Bounty), mild soap (like Dial, Safeguard, or [place]), and water. Avoid soaps that contain perfumes or lotions.  For a new pouch (1-piece system) or a new wafer (2-piece system), measure your stoma using the stoma guide in each box of supplies.  Trace the shape of your stoma onto the back of the new pouch or the back of the new wafer. Cut out the opening. Remove the paper backing and set it aside.  Optional: Apply a skin barrier powder to surrounding skin if it is irritated (bare or weeping), and dust off the excess. Optional: Apply a skin-prep wipe (such as Skin Prep or All-Kare) to the skin around the stoma, and let it dry. Do not apply this solution if the  skin is irritated (red, tender, or broken) or if you have shaved around the stoma. Optional: Apply a skin barrier paste (such as Stomahesive, Coloplast, or Premium) around the opening cut in the back of the pouch or wafer. Allow it to dry for 30 to 60 seconds.  Hold the pouch (1-piece system) or wafer (2-piece system) with the sticky side toward your body. Make sure the skin around the stoma is wrinkle-free. Center the opening on the stoma, then press firmly to your abdomen (Fig. 4). Look in the mirror to check if you are placing the pouch, or wafer, in the right position. For a 2-piece system, snap the pouch onto the wafer. Make sure it snaps into place securely.  Place your hand over the stoma and the pouch or wafer for about 30 seconds. The heat from your hand can help the pouch or wafer stick to your skin.  Add deodorant (such as Super Banish or Nullo) to your pouch. Other options include food extracts such as vanilla oil and peppermint  extract. Add about 10 drops of the deodorant to the pouch. Then apply the closure clamp. Note: Do not use toxic  chemicals or commercial cleaning agents in your pouch. These substances may harm the stoma.  Optional: For extra seal, apply tape to all 4 sides around the pouch or wafer, as if you were framing a picture. You may use any brand of medical adhesive tape. Change your pouch every 5 to 7 days. Change it immediately if a leak occurs.  Wash your hands afterwards.  If you are wearing a 2-piece system, you may use 2 new pouches per week and alternate them. Rinse the pouch with mild soap and warm water and hang it to dry for the next day. Apply the fresh pouch. Alternate the 2 pouches like this for a week. After a week, change the wafer and begin with 2 new pouches. Place the old pouches in a plastic bag, and put them in the trash.   LIVING WITH AN OSTOMY  Emptying Your Pouch Empty your pouch when it is one-third full (of urine, stool, and/or gas). If you wait until your pouch is fuller than this, it will be more difficult to empty and more noticeable. When you empty your pouch, either put toilet paper in the toilet bowl first, or flush the toilet while you empty the pouch. This will reduce splashing. You can empty the pouch between your legs or to one side while sitting, or while standing or stooping. If you have a 2-piece system, you can snap off the pouch to empty it. Remember that your stoma may function during this time. If you wish to rinse your pouch after you empty it, a [place] baster can be helpful. When using a baster, squirt water up into the pouch through the opening at the bottom. With a 2-piece system, you can snap off the pouch to rinse it. After rinsing  your pouch, empty it into the toilet. When rinsing your pouch at home, put a few granules of Dreft soap in the rinse water. This helps lubricate and freshen your pouch. The inside of your pouch can be sprayed with non-stick cooking  oil (Pam spray). This may help reduce stool sticking to the inside of the pouch.  Bathing You may shower or bathe with your pouch on or off. Remember that your stoma may function during this time.  The materials you use to wash your stoma and the skin around it should be  clean, but they do not need to be sterile.  Wearing Your Pouch During hot weather, or if you perspire a lot in general, wear a cover over your pouch. This may prevent a rash on your skin under the pouch. Pouch covers are sold at ostomy supply stores. Wear the pouch inside your underwear for better support. Watch your weight. Any gain or loss of 10 to 15 pounds or more can change the way your pouch fits.  Going Away From Home A collapsible cup (like those that come in travel kits) or a soft plastic squirt bottle with a pull-up top (like a travel bottle for shampoo) can be used for rinsing your pouch when you are away from home. Tilt the opening of the pouch at an upward angle when using a cup to rinse.  Carry wet wipes or extra tissues to use in public bathrooms.  Carry an extra pouching system with you at all times.  Never keep ostomy supplies in the glove compartment of your car. Extreme heat or cold can damage the skin barriers and adhesive wafers on the pouch.  When you travel, carry your ostomy supplies with you at all times. Keep them within easy reach. Do not pack ostomy supplies in baggage that will be checked or otherwise separated from you, because your baggage might be lost. If you're traveling out of the country, it is helpful to have a letter stating that you are carrying ostomy supplies as a medical necessity.  If you need ostomy supplies while traveling, look in the yellow pages of the telephone book under "Surgical Supplies." Or call the local ostomy organization to find out where supplies are available.  Do not let your ostomy supplies get low. Always order new pouches before you use the last one.  Reducing  Odor Limit foods such as broccoli, cabbage, onions, fish, and garlic in your diet to help reduce odor. Each time you empty your pouch, carefully clean the opening of the pouch, both inside and outside, with toilet paper. Rinse your pouch 1 or 2 times daily after you empty it (see directions for emptying your pouch and going away from home). Add deodorant (such as Super Banish or Nullo) to your pouch. Use air deodorizers in your bathroom. Do not add aspirin to your pouch. Even though aspirin can help prevent odor, it could cause ulcers on your stoma.  When to call the doctor Call the doctor if you have any of the following symptoms: Purple, black, or white stoma Severe cramps lasting more than 6 hours Severe watery discharge from the stoma lasting more than 6 hours No output from the colostomy for 3 days Excessive bleeding from your stoma Swelling of your stoma to more than 1/2-inch larger than usual Pulling inward of your stoma below skin level Severe skin irritation or deep ulcers Bulging or other changes in your abdomen  When to call your ostomy nurse Call your ostomy/enterostomal therapy (WOCN) nurse if any of the following occurs: Frequent leaking of your pouching system Change in size or appearance of your stoma, causing discomfort or problems with your pouch Skin rash or rawness Weight gain or loss that causes problems with your pouch     FREQUENTLY ASKED QUESTIONS   Why haven't you met any of these folks who have an ostomy?  Well, maybe you have! You just did not recognize them because an ostomy doesn't show. It can be kept secret if you wish. Why, maybe some of your best friends, office  associates or neighbors have an ostomy ... you never can tell. People facing ostomy surgery have many quality-of-life questions like: Will you bulge? Smell? Make noises? Will you feel waste leaving your body? Will you be a captive of the toilet? Will you starve? Be a social outcast? Get/stay  married? Have babies? Easily bathe, go swimming, bend over?  OK, let's look at what you can expect:   Will you bulge?  Remember, without part of the intestine or bladder, and its contents, you should have a flatter tummy than before. You can expect to wear, with little exception, what you wore before surgery ... and this in-cludes tight clothing and bathing suits.   Will you smell?  Today, thanks to modern odor proof pouching systems, you can walk into an ostomy support group meeting and not smell anything that is foul or offensive. And, for those with an ileostomy or colostomy who are concerned about odor when emptying their pouch, there are in-pouch deodorants that can be used to eliminate any waste odors that may exist.   Will you make noises?  Everyone produces gas, especially if they are an air-swallower. But intestinal sounds that occur from time to time are no differ-ent than a gurgling tummy, and quite often your clothing will muffle any sounds.   Will you feel the waste discharges?  For those with a colostomy or ileostomy there might be a slight pressure when waste leaves your body, but understand that the intestines have no nerve endings, so there will be no unpleasant sensations. Those with a urostomy will probably be unaware of any kidney drainage.   Will you be a captive of the toilet?  Immediately post-op you will spend more time in the bathroom than you will after your body recovers from surgery. Every person is different, but on average those with an ileostomy or urostomy may empty their pouches 4 to 6 times a day; a little  less if you have a colostomy. The average wear time between pouch system changes is 3 to 5 days and the changing process should take less than 30 minutes.   Will I need to be on a special diet? Most people return to their normal diet when they have recovered from surgery. Be sure to chew your food well, eat a well-balanced diet and drink plenty of fluids. If  you experience problems with a certain food, wait a couple of weeks and try it again.  Will there be odor and noises? Pouching systems are designed to be odor-proof or odor-resistant. There are deodorants that can be used in the pouch. Medications are also available to help reduce odor. Limit gas-producing foods and carbonated beverages. You will experience less gas and fewer noises as you heal from surgery.  How much time will it take to care for my ostomy? At first, you may spend a lot of time learning about your ostomy and how to take care of it. As you become more comfortable and skilled at changing the pouching system, it will take very little time to care for it.   Will I be able to return to work? People with ostomies can perform most jobs. As soon as you have healed from surgery, you should be able to return to work. Heavy lifting (more than 10 pounds) may be discouraged.   What about intimacy? Sexual relationships and intimacy are important and fulfilling aspects of your life. They should continue after ostomy surgery. Intimacy-related concerns should be discussed openly between you and  your partner.   Can I wear regular clothing? You do not need to wear special clothing. Ostomy pouches are fairly flat and barely noticeable. Elastic undergarments will not hurt the stoma or prevent the ostomy from functioning.   Can I participate in sports? An ostomy should not limit your involvement in sports. Many people with ostomies are runners, skiers, swimmers or participate in other active lifestyles. Talk with your caregiver first before doing heavy physical activity.  Will you starve?  Not if you follow doctor's orders at each stage of your post-op adjustment. There is no such thing as an "ostomy diet". Some people with an ostomy will be able to eat and tolerate anything; others may find diffi-culty with some foods. Each person is an individual and must determine, by trial, what is best for  them. A good practice for all is to drink plenty of water.   Will you be a social outcast?  Have you met anyone who has an ostomy and is a social outcast? Why should you be the first? Only your attitude and self image will effect how you are treated. No confi-dent person is an Investment banker, corporate.    PROFESSIONAL HELP   Resources are available if you need help or have questions about your ostomy.   Specially trained nurses called Wound, Ostomy Continence Nurses (WOCN) are available for consultation in most major medical centers.  Consider getting an ostomy consult at an outpatient ostomy clinic.    has an Ostomy Clinic run by an Chartered certified accountant at the [facility] campus.  [phone]. [facility] can help set up an appointment   The [employer] (UOA) is a group made up of many local chapters throughout the [place]. These local groups hold meetings and provide support to prospective and existing ostomates. They sponsor educational events and have qualified visitors to make personal or telephone visits. Contact the UOA for the chapter nearest you and for other educational publications.  More detailed information can be found in Colostomy Guide, a publication of the [employer] (UOA). Contact UOA at 1-312-240-6013 or visit their web site at YellowSpecialist.at. The website contains links to other sites, suppliers and resources.  Media planner Start Services: Start at the website to enlist for support.  Your Wound Ostomy (WOCN) nurse may have started this process. [email or web address] Secure Start services are designed to support people as they live their lives with an ostomy or neurogenic bladder. Enrolling is easy and at no cost to the patient. We realize that each person's needs and life journey are different. Through Secure Start services, we want to help people live their life, their  way.  #######################################################    SURGERY: POST OP INSTRUCTIONS (Surgery for small bowel obstruction, colon resection, etc)   ######################################################################  EAT Gradually transition to a high fiber diet with a fiber supplement over the next few days after discharge  WALK Walk an hour a day.  Control your pain to do that.    CONTROL PAIN Control pain so that you can walk, sleep, tolerate sneezing/coughing, go up/down stairs.  HAVE A BOWEL MOVEMENT DAILY Keep your bowels regular to avoid problems.  OK to try a laxative to override constipation.  OK to use an antidairrheal to slow down diarrhea.  Call if not better after 2 tries  CALL IF YOU HAVE PROBLEMS/CONCERNS Call if you are still struggling despite following these instructions. Call if you have concerns not answered by these instructions  ######################################################################  DIET Follow a light diet the first few days at home.  Start with a bland diet such as soups, liquids, starchy foods, low fat foods, etc.  If you feel full, bloated, or constipated, stay on a ful liquid or pureed/blenderized diet for a few days until you feel better and no longer constipated. Be sure to drink plenty of fluids every day to avoid getting dehydrated (feeling dizzy, not urinating, etc.). Gradually add a fiber supplement to your diet over the next week.  Gradually get back to a regular solid diet.  Avoid fast food or heavy meals the first week as you are more likely to get nauseated. It is expected for your digestive tract to need a few months to get back to normal.  It is common for your bowel movements and stools to be irregular.  You will have occasional bloating and cramping that should eventually fade away.  Until you are eating solid food normally, off all pain medications, and back to regular activities; your bowels will not be  normal. Focus on eating a low-fat, high fiber diet the rest of your life (See Getting to Good Bowel Health, below).  CARE of your INCISION or WOUND  It is good for closed incisions and even open wounds to be washed every day.  Shower every day.  Short baths are fine.  Wash the incisions and wounds clean with soap & water.    You may leave closed incisions open to air if it is dry.   You may cover the incision with clean gauze & replace it after your daily shower for comfort.  TEGADERM & WICKS:  You have clear gauze band-aid dressings over your closed incision(s).  Remove the dressings & shoelace ribbon wicks in your largest incision 3 days after surgery.= Monday 10/28    If you have an open wound with a wound vac, see wound vac care instructions.    ACTIVITIES as tolerated Start light daily activities --- self-care, walking, climbing stairs-- beginning the day after surgery.  Gradually increase activities as tolerated.  Control your pain to be active.  Stop when you are tired.  Ideally, walk several times a day, eventually an hour a day.   Most people are back to most day-to-day activities in a few weeks.  It takes 4-8 weeks to get back to unrestricted, intense activity. If you can walk 30 minutes without difficulty, it is safe to try more intense activity such as jogging, treadmill, bicycling, low-impact aerobics, swimming, etc. Save the most intensive and strenuous activity for last (Usually 4-8 weeks after surgery) such as sit-ups, heavy lifting, contact sports, etc.  Refrain from any intense heavy lifting or straining until you are off narcotics for pain control.  You will have off days, but things should improve week-by-week. DO NOT PUSH THROUGH PAIN.  Let pain be your guide: If it hurts to do something, don't do it.  Pain is your body warning you to avoid that activity for another week until the pain goes down. You may drive when you are no longer taking narcotic prescription pain  medication, you can comfortably wear a seatbelt, and you can safely make sudden turns/stops to protect yourself without hesitating due to pain. You may have sexual intercourse when it is comfortable. If it hurts to do something, stop.   MEDICATIONS Take your usually prescribed home medications unless otherwise directed.   Blood thinners:  You can restart any strong blood thinners after the second postoperative day.  It  is OK to continue aspirin before & after surgery..    Some blood in BMs the first 1-2 weeks is common but should taper down & be small volume.  If you are passing many large clots, call your surgeon    PAIN CONTROL Pain after surgery or related to activity is often due to strain/injury to muscle, tendon, nerves and/or incisions.  This pain is usually short-term and will improve in a few months.  To help speed the process of healing and to get back to regular activity more quickly, DO THE FOLLOWING THINGS TOGETHER: Increase activity gradually.  DO NOT PUSH THROUGH PAIN Use Ice and/or Heat Try Gentle Massage and/or Stretching Take over the counter pain medication Take Narcotic prescription pain medication for more severe pain  Good pain control = faster recovery.  It is better to take more medicine to be more active than to stay in bed all day to avoid medications.  Increase activity gradually Avoid heavy lifting at first, then increase to lifting as tolerated over the next 6 weeks. Do not "push through" the pain.  Listen to your body and avoid positions and maneuvers than reproduce the pain.  Wait a few days before trying something more intense Walking an hour a day is encouraged to help your body recover faster and more safely.  Start slowly and stop when getting sore.  If you can walk 30 minutes without stopping or pain, you can try more intense activity (running, jogging, aerobics, cycling, swimming, treadmill, sex, sports, weightlifting, etc.) Remember: If it hurts to do  it, then don't do it! Use Ice and/or Heat You will have swelling and bruising around the incisions.  This will take several weeks to resolve. Ice packs or heating pads (6-8 times a day, 30-60 minutes at a time) will help sooth soreness & bruising. Some people prefer to use ice alone, heat alone, or alternate between ice & heat.  Experiment and see what works best for you.  Consider trying ice for the first few days to help decrease swelling and bruising; then, switch to heat to help relax sore spots and speed recovery. Shower every day.  Short baths are fine.  It feels good!  Keep the incisions and wounds clean with soap & water.   Try Gentle Massage and/or Stretching Massage at the area of pain many times a day Stop if you feel pain - do not overdo it Take over the counter pain medication This helps the muscle and nerve tissues become less irritable and calm down faster Choose ONE of the following over-the-counter anti-inflammatory medications: Acetaminophen 500mg  tabs (Tylenol) 1-2 pills with every meal and just before bedtime (avoid if you have liver problems or if you have acetaminophen in you narcotic prescription) Naproxen 220mg  tabs (ex. Aleve, Naprosyn) 1-2 pills twice a day (avoid if you have kidney, stomach, IBD, or bleeding problems) Ibuprofen 200mg  tabs (ex. Advil, Motrin) 3-4 pills with every meal and just before bedtime (avoid if you have kidney, stomach, IBD, or bleeding problems) Take with food/snack several times a day as directed for at least 2 weeks to help keep pain / soreness down & more manageable. Take Narcotic prescription pain medication for more severe pain A prescription for strong pain control is often given to you upon discharge (for example: oxycodone/Percocet, hydrocodone/Norco/Vicodin, or tramadol/Ultram) Take your pain medication as prescribed. Be mindful that most narcotic prescriptions contain Tylenol (acetaminophen) as well - avoid taking too much Tylenol. If  you are having problems/concerns with  the prescription medicine (does not control pain, nausea, vomiting, rash, itching, etc.), please call us [phone] to see if we need to switch you to a different pain medicine that will work better for you and/or control your side effects better. If you need a refill on your pain medication, you must call the office before 4 pm and on weekdays only.  By federal law, prescriptions for narcotics cannot be called into a pharmacy.  They must be filled out on paper & picked up from our office by the patient or authorized caretaker.  Prescriptions cannot be filled after 4 pm nor on weekends.    WHEN TO CALL us [phone] Severe uncontrolled or worsening pain  Fever over 101 F (38.5 C) Concerns with the incision: Worsening pain, redness, rash/hives, swelling, bleeding, or drainage Reactions / problems with new medications (itching, rash, hives, nausea, etc.) Nausea and/or vomiting Difficulty urinating Difficulty breathing Worsening fatigue, dizziness, lightheadedness, blurred vision Other concerns If you are not getting better after two weeks or are noticing you are getting worse, contact our office [phone] for further advice.  We may need to adjust your medications, re-evaluate you in the office, send you to the emergency room, or see what other things we can do to help. The clinic staff is available to answer your questions during regular business hours (8:30am-5pm).  Please don't hesitate to call and ask to speak to one of our nurses for clinical concerns.    A surgeon from [facility] is always on call at the hospitals 24 hours/day If you have a medical emergency, go to the nearest emergency room or call 911.  FOLLOW UP in our office One the day of your discharge from the hospital (or the next business weekday), please call [facility] to set up or confirm an appointment to see your surgeon in the office for a follow-up  appointment.  Usually it is 2-3 weeks after your surgery.   If you have skin staples at your incision(s), let the office know so we can set up a time in the office for the nurse to remove them (usually around 10 days after surgery). Make sure that you call for appointments the day of discharge (or the next business weekday) from the hospital to ensure a convenient appointment time. IF YOU HAVE DISABILITY OR FAMILY LEAVE FORMS, BRING THEM TO THE OFFICE FOR PROCESSING.  DO NOT GIVE THEM TO YOUR DOCTOR.  [facility] [address], [place], [place]  [address] ? [phone] - Main [phone] - Toll Free,  [phone] - Fax [email or web address]    GETTING TO GOOD BOWEL HEALTH. It is expected for your digestive tract to need a few months to get back to normal.  It is common for your bowel movements and stools to be irregular.  You will have occasional bloating and cramping that should eventually fade away.  Until you are eating solid food normally, off all pain medications, and back to regular activities; your bowels will not be normal.   Avoiding constipation The goal: ONE SOFT BOWEL MOVEMENT A DAY!    Drink plenty of fluids.  Choose water first. TAKE A FIBER SUPPLEMENT EVERY DAY THE REST OF YOUR LIFE During your first week back home, gradually add back a fiber supplement every day Experiment which form you can tolerate.   There are many forms such as powders, tablets, wafers, gummies, etc Psyllium bran (Metamucil), methylcellulose (Citrucel), Miralax or Glycolax,  Benefiber, Flax Seed.  Adjust the dose week-by-week (1/2 dose/day to 6 doses a day) until you are moving your bowels 1-2 times a day.  Cut back the dose or try a different fiber product if it is giving you problems such as diarrhea or bloating. Sometimes a laxative is needed to help jump-start bowels if constipated until the fiber supplement can help regulate your bowels.  If you are  tolerating eating & you are farting, it is okay to try a gentle laxative such as double dose MiraLax, prune juice, or Milk of Magnesia.  Avoid using laxatives too often. Stool softeners can sometimes help counteract the constipating effects of narcotic pain medicines.  It can also cause diarrhea, so avoid using for too long. If you are still constipated despite taking fiber daily, eating solids, and a few doses of laxatives, call our office. Controlling diarrhea Try drinking liquids and eating bland foods for a few days to avoid stressing your intestines further. Avoid dairy products (especially milk & ice cream) for a short time.  The intestines often can lose the ability to digest lactose when stressed. Avoid foods that cause gassiness or bloating.  Typical foods include beans and other legumes, cabbage, broccoli, and dairy foods.  Avoid greasy, spicy, fast foods.  Every person has some sensitivity to other foods, so listen to your body and avoid those foods that trigger problems for you. Probiotics (such as active yogurt, Align, etc) may help repopulate the intestines and colon with normal bacteria and calm down a sensitive digestive tract Adding a fiber supplement gradually can help thicken stools by absorbing excess fluid and retrain the intestines to act more normally.  Slowly increase the dose over a few weeks.  Too much fiber too soon can backfire and cause cramping & bloating. It is okay to try and slow down diarrhea with a few doses of antidiarrheal medicines.   Bismuth subsalicylate (ex. Kayopectate, Pepto Bismol) for a few doses can help control diarrhea.  Avoid if pregnant.   Loperamide (Imodium) can slow down diarrhea.  Start with one tablet (2mg ) first.  Avoid if you are having fevers or severe pain.  ILEOSTOMY PATIENTS WILL HAVE CHRONIC DIARRHEA since their colon is not in use.    Drink plenty of liquids.  You will need to drink even more glasses of water/liquid a day to avoid getting  dehydrated. Record output from your ileostomy.  Expect to empty the bag every 3-4 hours at first.  Most people with a permanent ileostomy empty their bag 4-6 times at the least.   Use antidiarrheal medicine (especially Imodium) several times a day to avoid getting dehydrated.  Start with a dose at bedtime & breakfast.  Adjust up or down as needed.  Increase antidiarrheal medications as directed to avoid emptying the bag more than 8 times a day (every 3 hours). Work with your wound ostomy nurse to learn care for your ostomy.  See ostomy care instructions. TROUBLESHOOTING IRREGULAR BOWELS 1) Start with a soft & bland diet. No spicy, greasy, or fried foods.  2) Avoid gluten/wheat or dairy products from diet to see if symptoms improve. 3) Miralax 17gm or flax seed mixed in 8oz. water or juice-daily. May use 2-4 times a day as needed. 4) Gas-X, Phazyme, etc. as needed for gas & bloating.  5) Prilosec (omeprazole) over-the-counter as needed 6)  Consider probiotics (Align, Activa, etc) to help calm the bowels down  Call your doctor if you are getting worse or not getting  better.  Sometimes further testing (cultures, endoscopy, X-ray studies, CT scans, bloodwork, etc.) may be needed to help diagnose and treat the cause of the diarrhea. [facility] [address], [place], [place]  [address] [phone] - Main.    [phone]  - Toll Free.   [phone] - Fax [email or web address]

## 2023-06-14 NOTE — Progress Notes (Signed)
   06/14/23 1645  Vent Select  $ Ventilator Initial/Subsequent  Initial  Invasive or Noninvasive Invasive  Adult Vent Y  Vent start date 06/14/23  Vent start time 1745  Airway 7.5 mm  Placement Date/Time: 06/14/23 (c) 1435   Grade View: Grade 1  Airway Device: Endotracheal Tube  Laryngoscope Blade: MAC;4  ETT Types: Oral  Size (mm): 7.5 mm  Cuffed: Cuffed;Min.occ.pres.  Insertion attempts: 1  Airway Equipment: Stylet  Placement Con...  Secured at (cm) 23 cm  Measured From Lips  Secured Location Center  Secured By [employer]  Prone position No  Head position Right  Cuff Pressure (cm H2O) Green OR 18-26 CmH2O  Site Condition Dry  Adult IBW/VT Calculations  Height 5\' 8"  (1.727 m)  IBW/kg (Calculated) Male 68.4 kg  Low Range Vt 6cc/kg MALE 410.4 mL  Adult Moderate Range Vt 8cc/kg MA 547.2 mL  Adult High Range Vt 10cc/kg MALE 684 mL  IBW/kg (Calculated) MALE 63.9 kg  Low Range Vt 6cc/kg MALE 383.4 mL  Adult Moderate Range vt 8cc/kg MALE 511.2 mL  Adult High Range Vt 10cc/kg MALE 639 mL  Adult Ventilator Settings  Vent Type Servo i  Humidity HME  Vent Mode (S)  PRVC  Vt Set (S)  550 mL  Set Rate (S)  18 bmp  FiO2 (%) (S)  100 %  I Time 1.01 Sec(s)  PEEP (S)  5 cmH20  Adult Ventilator Measurements  Peak Airway Pressure 24 L/min  Mean Airway Pressure 11 cmH20  Plateau Pressure 22 cmH20  Resp Rate Spontaneous 0 br/min  Resp Rate Total 18 br/min  Exhaled Vt 551 mL  Measured Ve 9.5 L  I:E Ratio Measured 1:2.3  Total PEEP 5 cmH20  SpO2 99 %  Adult Ventilator Alarms  Alarms On Y  Ve High Alarm 24 L/min  Ve Low Alarm 4 L/min  Resp Rate High Alarm 36 br/min  Resp Rate Low Alarm 8  PEEP Low Alarm 3 cmH2O  Press High Alarm 40 cmH2O  T Apnea 20 sec(s)  VAP Prevention  HOB> 30 Degrees Y  Breath Sounds  Bilateral Breath Sounds Clear;Diminished  R Upper  Breath Sounds Clear  L Upper Breath Sounds Clear  R Lower Breath Sounds Diminished  L Lower Breath  Sounds Diminished  Vent Respiratory Assessment  Level of Consciousness Unresponsive  Respiratory Pattern Regular;Unlabored;Other (Comment) (Ventilator controlled.)  Patient Tolerance Tolerated well  Suction Method  Respiratory Interventions Airway suction;Oral suction  Oral Suctioning/Secretions  Suction Type Oral  Suction Device Catheter  Secretion Amount Small  Secretion Color Pink tinged  Secretion Consistency Thin  Suction Tolerance Tolerated well  Suctioning Adverse Effects None  Airway Suctioning/Secretions  Suction Type ETT  Suction Device  Catheter  Inline suction changed Yes  Secretion Amount None  Suction Tolerance Tolerated well  Suctioning Adverse Effects None  New vent pt from the OR: placed on appropriate settings 8cc/kg.

## 2023-06-14 NOTE — Progress Notes (Signed)
I discussed operative findings, updated the patient's status, discussed probable steps to recovery, and gave postoperative recommendations to the patient's daughter, [name] .  Recommendations were made.  Questions were answered.  She expressed understanding & appreciation.  Ardeth Sportsman, MD, FACS, MASCRS

## 2023-06-14 NOTE — Care Management Important Message (Signed)
Important Message  Patient Details  Name: Bradley [name] MRN: [id number] Date of Birth: 29-May-1951   Important Message Given:  Yes - Medicare IM     Sherilyn Banker 06/14/2023, 4:13 PM

## 2023-06-14 NOTE — Anesthesia Postprocedure Evaluation (Signed)
Anesthesia Post Note  Patient: Bradley [name]  Procedure(s) Performed: EXPLORATORY LAPAROTOMY; TAKEDOWN OF SPLENIC FLEXURE; LEFT HEMICOLECTOMY, COLOSTOMY     Patient location during evaluation: SICU Anesthesia Type: General Level of consciousness: sedated Pain management: pain level controlled Vital Signs Assessment: post-procedure vital signs reviewed and stable Respiratory status: patient remains intubated per anesthesia plan Cardiovascular status: stable Postop Assessment: no apparent nausea or vomiting Anesthetic complications: no   No notable events documented.  Last Vitals:    Last Pain:                 Collene Schlichter

## 2023-06-14 NOTE — Anesthesia Preprocedure Evaluation (Addendum)
Anesthesia Evaluation  Patient identified by MRN, date of birth, ID band Patient awake    Reviewed: Allergy & Precautions, NPO status , Patient's Chart, lab work & pertinent test results  History of Anesthesia Complications Negative for: history of anesthetic complications  Airway Mallampati: II  TM Distance: >3 FB Neck ROM: Full    Dental  (+) Poor Dentition, Missing, Loose, Dental Advisory Given, Chipped   Pulmonary Current Smoker and Patient abstained from smoking.   breath sounds clear to auscultation       Cardiovascular hypertension, Pt. on medications and Pt. on home beta blockers (-) angina  Rhythm:Regular Rate:Normal     Neuro/Psych     Bipolar Disorder Schizophrenia  negative neurological ROS     GI/Hepatic Neg liver ROS,,,Adenoma colon   Endo/Other  diabetes (glu 109), Oral Hypoglycemic Agents  BMI 36.5  Renal/GU Renal InsufficiencyRenal disease     Musculoskeletal   Abdominal  (+) + obese  Peds  Hematology   Anesthesia Other Findings   Reproductive/Obstetrics                             Anesthesia Physical Anesthesia Plan  ASA: 3  Anesthesia Plan: General   Post-op Pain Management: Ofirmev IV (intra-op)*   Induction: Intravenous and Rapid sequence  PONV Risk Score and Plan: 0 and Treatment may vary due to age or medical condition  Airway Management Planned: Oral ETT  Additional Equipment: None  Intra-op Plan:   Post-operative Plan: Extubation in OR  Informed Consent: I have reviewed the patients History and Physical, chart, labs and discussed the procedure including the risks, benefits and alternatives for the proposed anesthesia with the patient or authorized representative who has indicated his/her understanding and acceptance.     Dental advisory given  Plan Discussed with: CRNA and Surgeon  Anesthesia Plan Comments:         Anesthesia Quick  Evaluation

## 2023-06-14 NOTE — Consult Note (Signed)
WOC Nurse requested for preoperative stoma site marking  Discussed surgical procedure and stoma creation with patient.  Explained role of the WOC nurse team.  Provided the patient with educational booklet and provided samples of pouching options.  Answered patient questions.   Examined patient lying, sitting, and standing in order to place the marking in the patient's visual field, away from any creases or abdominal contour issues and within the rectus muscle.  Attempted to mark below the patient's belt line however patient unable to visualize any marks below umbilicus so marked above.   Marked for colostomy in the LUQ  5.5 cm to the left of the umbilicus and 2 cm above the umbilicus.  Marked for ileostomy in the RUQ  6 cm to the right of the umbilicus and 3 cm above the umbilicus.   Patient's abdomen cleansed with CHG wipes at site markings, allowed to air dry prior to marking.Covered mark with thin film transparent dressing to preserve mark until date of surgery.   WOC Nurse team will follow up with patient after surgery for continue ostomy care and teaching.   Thank you,    Priscella Mann MSN, RN-BC, [employer] [phone]

## 2023-06-14 NOTE — Consult Note (Signed)
NAME:  Bradley [name], MRN:  [id number], DOB:  1951-08-15, LOS: 3 ADMISSION DATE:  06/11/2023, CONSULTATION DATE:  06/14/2023 REFERRING MD:  Dr. Michaell Cowing - CCS, CHIEF COMPLAINT:  Ventilator management    History of Present Illness:  Bradley [name] is a 72 y.o. male with a PMH significant for HTN, HLD, type 2 diabetes bipolar 2 disorder, colonic diverticular abscess, and arthritis who presented to the ED at [facility] for complaints of constipation and incidental finding of AKI.  Patient was admitted per family medicine teaching service with surgical consultation.   Patient was admitted for medical management of bowel obstruction however little progression was made and decision was made to proceed with laparoscopic left colectomy which was preformed 06/14/2023 post procedure patient remained intubated to allow for rest on vent overnight, PCCM consulted for medical management and admission  Pertinent  Medical History  HTN, HLD, type 2 diabetes bipolar 2 disorder, colonic diverticular abscess, and arthritis   Significant Hospital Events: Including procedures, antibiotic start and stop dates in addition to other pertinent events   10/23 admitted with constipation and AKI  10/25 transferred to ICU post colectomy on vent   Interim History / Subjective:  As above   Objective   Blood pressure (!) 153/94, pulse 91, temperature 98.6 F (37 C), temperature source Oral, resp. rate 18, height 5\' 8"  (1.727 m), weight 108.8 kg, SpO2 99%.    Vent Mode: PRVC FiO2 (%):  [100 %] 100 % Set Rate:  [18 bmp] 18 bmp Vt Set:  [550 mL] 550 mL PEEP:  [5 cmH20] 5 cmH20 Plateau Pressure:  [22 cmH20] 22 cmH20   Intake/Output Summary (Last 24 hours) at 06/14/2023 1805 Last data filed at 06/14/2023 1642 Gross per 24 hour  Intake 1600 ml  Output 4551 ml  Net -2951 ml   Filed Weights   06/11/23 1021 06/13/23 0900 06/14/23 1015  Weight: 109.8 kg 109.8 kg 108.8 kg    Examination: General: Acute on  chronically ill appearing middle aged male on mechanical ventilation, in NAD HEENT: ETT, MM pink/moist, PERRL,  Neuro: Deeply sedated on vent  CV: s1s2 regular rate and rhythm, no murmur, rubs, or gallops,  PULM:  Clear to auscultation, no increased work of breathing, no increased work of breathing, tolerating vent  GI: soft, bowel sounds active in all 4 quadrants, non-tender, non-distended Extremities: warm/dry, no edema  Skin: no rashes or lesions  Resolved Hospital Problem list     Assessment & Plan:  Expected post op ventilator management  P: Continue ventilator support with lung protective strategies  Wean PEEP and FiO2 for sats greater than 90%. Head of bed elevated 30 degrees. Plateau pressures less than 30 cm H20.  Follow intermittent chest x-ray and ABG.   SAT/SBT as tolerated, mentation preclude extubation  Ensure adequate pulmonary hygiene  Follow cultures  VAP bundle in place  PAD protocol  Sigmoid/descending colonic stricture  -CT scan shows diffuse small and large bowel dilation to the level of the distal descending colon where there is a lobulated intraluminal density suspicious for colonic mass -s/p flex sig: benign-appearing, intrinsic severe stenosis in the proximal sigmoid colon to distal descending colon and was non- traverse -5L feculent gastric contents removed during ex lap P: Management per Surgery Colostomy care, WOC consulted  Pain control   AKI - improving  -Creatinine on admission 3.07 with GFR 21 P: Follow renal function  Monitor urine output Trend Bmet Avoid nephrotoxins Ensure adequate renal perfusion   Essential HTN  HLD  -Home medications include Norvasc, hydrochlorothiazide, Lisinopril, Bystolic, and Pravastatin  P: Hold home medications resume when able to take orals  PRN IV antihypertensives    Best Practice (right click and "Reselect all SmartList Selections" daily)   Diet/type: NPO DVT prophylaxis: SCD GI prophylaxis:  PPI Lines: N/A Foley:  N/A Code Status:  full code Last date of multidisciplinary goals of care discussion: Pending   Labs   CBC: Recent Labs  Lab 06/11/23 1023 06/12/23 0347 06/13/23 0839 06/14/23 0858  WBC 7.4 5.8 6.4 6.5  HGB 15.0 14.0 14.0 14.8  HCT 47.7 44.0 43.9 45.9  MCV 92.6 92.8 91.5 92.0  PLT 211 163 165 162    Basic Metabolic Panel: Recent Labs  Lab 06/11/23 1023 06/12/23 0347 06/13/23 0839 06/14/23 0858  NA 134* 136 138 138  K 4.8 5.0 4.7 5.1  CL 100 102 105 104  CO2 20* 21* 23 22  GLUCOSE 143* 88 89 95  BUN 67* 57* 37* 35*  CREATININE 3.07* 1.74* 1.35* 1.37*  CALCIUM 8.4* 8.4* 8.9 8.9   GFR: Estimated Creatinine Clearance: 60.9 mL/min (A) (by C-G formula based on SCr of 1.37 mg/dL (H)). Recent Labs  Lab 06/11/23 1023 06/12/23 0347 06/13/23 0839 06/14/23 0858  WBC 7.4 5.8 6.4 6.5    Liver Function Tests: Recent Labs  Lab 06/11/23 1023  AST 12*  ALT 19  ALKPHOS 38  BILITOT 0.8  PROT 8.2*  ALBUMIN 3.7   Recent Labs  Lab 06/11/23 1023  LIPASE 24   No results for input(s): "AMMONIA" in the last 168 hours.  ABG    Component Value Date/Time   TCO2 25 11/13/2011 0215     Coagulation Profile: No results for input(s): "INR", "PROTIME" in the last 168 hours.  Cardiac Enzymes: No results for input(s): "CKTOTAL", "CKMB", "CKMBINDEX", "TROPONINI" in the last 168 hours.  HbA1C: Hgb A1c MFr Bld  Date/Time Value Ref Range Status  06/12/2023 03:47 AM 6.5 (H) 4.8 - 5.6 % Final    Comment:    (NOTE) Pre diabetes:          5.7%-6.4%  Diabetes:              >6.4%  Glycemic control for   <7.0% adults with diabetes   07/26/2015 03:58 AM 6.3 (H) 4.8 - 5.6 % Final    Comment:    (NOTE)         Pre-diabetes: 5.7 - 6.4         Diabetes: >6.4         Glycemic control for adults with diabetes: <7.0     CBG: Recent Labs  Lab 06/14/23 0227 06/14/23 0547 06/14/23 0927 06/14/23 1023 06/14/23 1349  GLUCAP 89 94 92 109* 94     Review of Systems:   Unable to assess   Past Medical History:  He,  has a past medical history of Arthritis, Bipolar 2 disorder (HCC) (2005), Colon adenomas (2009, 2015), Colonic diverticular abscess, Diabetes mellitus without complication (HCC), Hyperlipemia, Hypertension, Hypogonadism male, Lower GI bleed (07/2015), Otitis media with effusion, left (02/01/2023), and 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   Surgical History:   Past Surgical History:  Procedure Laterality Date   COLONOSCOPY  2009, 2015   ENTEROSCOPY N/A 07/28/2015   Procedure: ENTEROSCOPY;  Surgeon: Meryl Dare, MD;  Location: [facility] ENDOSCOPY;  Service: Endoscopy;  Laterality: N/A;     Social History:   reports that he has been smoking cigarettes. He has a 20 pack-year  smoking history. He has never used smokeless tobacco. He reports that he does not drink alcohol and does not use drugs.   Family History:  His family history includes Cancer in his brother. There is no history of Colon cancer, Esophageal cancer, Rectal cancer, or Stomach cancer.   Allergies No Known Allergies   Home Medications  Prior to Admission medications   Medication Sig Start Date End Date Taking? Authorizing Provider  amLODipine (NORVASC) 10 MG tablet Take 10 mg by mouth daily.   Yes [provider]  divalproex (DEPAKOTE ER) 500 MG 24 hr tablet Take 500 mg by mouth 2 (two) times daily.   Yes [provider]  hydrochlorothiazide (HYDRODIURIL) 50 MG tablet Take 50 mg by mouth daily.   Yes [provider]  lisinopril (PRINIVIL,ZESTRIL) 40 MG tablet Take 40 mg by mouth daily.   Yes [provider]  nebivolol (BYSTOLIC) 10 MG tablet Take 1 tablet (10 mg total) by mouth daily. 02/25/23  Yes Meriam Sprague, MD  pravastatin (PRAVACHOL) 40 MG tablet Take 1 tablet (40 mg total) by mouth every evening. 04/09/23 07/08/23 Yes Chandrasekhar, Mahesh A, MD  risperiDONE (RISPERDAL) 2 MG tablet Take 2 mg by mouth  at bedtime. 05/21/23  Yes [provider]     Critical care time:    Performed by: Gorgeous Newlun D. Harris  Total critical care time: 40 minutes  Critical care time was exclusive of separately billable procedures and treating other patients.  Critical care was necessary to treat or prevent imminent or life-threatening deterioration.  Critical care was time spent personally by me on the following activities: development of treatment plan with patient and/or surrogate as well as nursing, discussions with consultants, evaluation of patient's response to treatment, examination of patient, obtaining history from patient or surrogate, ordering and performing treatments and interventions, ordering and review of laboratory studies, ordering and review of radiographic studies, pulse oximetry and re-evaluation of patient's condition.  Forrestine Lecrone D. Harris, NP-C [facility] Personal contact information can be found on Amion  If no contact or response made please call [phone] 06/14/2023, 6:23 PM

## 2023-06-15 DIAGNOSIS — Z9911 Dependence on respirator [ventilator] status: Secondary | ICD-10-CM

## 2023-06-15 DIAGNOSIS — K56600 Partial intestinal obstruction, unspecified as to cause: Secondary | ICD-10-CM | POA: Diagnosis not present

## 2023-06-15 LAB — BASIC METABOLIC PANEL
Anion gap: 12 (ref 5–15)
Anion gap: 12 (ref 5–15)
BUN: 47 mg/dL — ABNORMAL HIGH (ref 8–23)
BUN: 46 mg/dL — ABNORMAL HIGH (ref 8–23)
CO2: 23 mmol/L (ref 22–32)
CO2: 22 mmol/L (ref 22–32)
Calcium: 8.1 mg/dL — ABNORMAL LOW (ref 8.9–10.3)
Calcium: 7.9 mg/dL — ABNORMAL LOW (ref 8.9–10.3)
Chloride: 103 mmol/L (ref 98–111)
Chloride: 102 mmol/L (ref 98–111)
Creatinine, Ser: 2.41 mg/dL — ABNORMAL HIGH (ref 0.61–1.24)
Creatinine, Ser: 2.69 mg/dL — ABNORMAL HIGH (ref 0.61–1.24)
GFR, Estimated: 28 mL/min — ABNORMAL LOW (ref >60)
GFR, Estimated: 25 mL/min — ABNORMAL LOW (ref >60)
Glucose, Bld: 163 mg/dL — ABNORMAL HIGH (ref 70–99)
Glucose, Bld: 164 mg/dL — ABNORMAL HIGH (ref 70–99)
Potassium: 5.2 mmol/L — ABNORMAL HIGH (ref 3.5–5.1)
Potassium: 4.9 mmol/L (ref 3.5–5.1)
Sodium: 138 mmol/L (ref 135–145)
Sodium: 136 mmol/L (ref 135–145)

## 2023-06-15 LAB — GLUCOSE, CAPILLARY
Glucose-Capillary: 160 mg/dL — ABNORMAL HIGH (ref 70–99)
Glucose-Capillary: 177 mg/dL — ABNORMAL HIGH (ref 70–99)
Glucose-Capillary: 122 mg/dL — ABNORMAL HIGH (ref 70–99)
Glucose-Capillary: 127 mg/dL — ABNORMAL HIGH (ref 70–99)

## 2023-06-15 LAB — CBC
HCT: 42.1 % (ref 39.0–52.0)
HCT: 38.8 % — ABNORMAL LOW (ref 39.0–52.0)
Hemoglobin: 13.3 g/dL (ref 13.0–17.0)
Hemoglobin: 12.8 g/dL — ABNORMAL LOW (ref 13.0–17.0)
MCH: 29.8 pg (ref 26.0–34.0)
MCH: 30.6 pg (ref 26.0–34.0)
MCHC: 31.6 g/dL (ref 30.0–36.0)
MCHC: 33 g/dL (ref 30.0–36.0)
MCV: 94.2 fL (ref 80.0–100.0)
MCV: 92.8 fL (ref 80.0–100.0)
Platelets: 146 10*3/uL — ABNORMAL LOW (ref 150–400)
Platelets: 141 10*3/uL — ABNORMAL LOW (ref 150–400)
RBC: 4.47 MIL/uL (ref 4.22–5.81)
RBC: 4.18 MIL/uL — ABNORMAL LOW (ref 4.22–5.81)
RDW: 15 % (ref 11.5–15.5)
RDW: 15 % (ref 11.5–15.5)
WBC: 8.1 10*3/uL (ref 4.0–10.5)
WBC: 7.1 10*3/uL (ref 4.0–10.5)
nRBC: 0 % (ref 0.0–0.2)
nRBC: 0 % (ref 0.0–0.2)

## 2023-06-15 LAB — PREALBUMIN: Prealbumin: 11 mg/dL — ABNORMAL LOW (ref 18–38)

## 2023-06-15 LAB — TRIGLYCERIDES: Triglycerides: 216 mg/dL — ABNORMAL HIGH (ref <150)

## 2023-06-15 MED ORDER — DEXTROSE IN LACTATED RINGERS 5 % IV SOLN: INTRAVENOUS | Status: AC

## 2023-06-15 MED ORDER — ORAL CARE MOUTH RINSE
15.0000 mL | OROMUCOSAL | Status: DC
  Administered 2023-06-15 – 2023-06-16 (×12): 15 mL via OROMUCOSAL

## 2023-06-15 MED ORDER — ORAL CARE MOUTH RINSE: 15.0000 mL | OROMUCOSAL | Status: DC | PRN

## 2023-06-15 MED ORDER — LACTATED RINGERS IV BOLUS
1000.0000 mL | Freq: Once | INTRAVENOUS | Status: AC
  Administered 2023-06-15: 1000 mL via INTRAVENOUS

## 2023-06-15 MED ORDER — INSULIN ASPART 100 UNIT/ML IJ SOLN
0.0000 [IU] | INTRAMUSCULAR | Status: DC
  Administered 2023-06-15 (×2): 1 [IU] via SUBCUTANEOUS
  Administered 2023-06-15: 2 [IU] via SUBCUTANEOUS
  Administered 2023-06-16 – 2023-06-18 (×3): 1 [IU] via SUBCUTANEOUS
  Administered 2023-06-18: 2 [IU] via SUBCUTANEOUS
  Administered 2023-06-18 – 2023-06-19 (×3): 1 [IU] via SUBCUTANEOUS
  Administered 2023-06-19: 2 [IU] via SUBCUTANEOUS
  Administered 2023-06-20: 1 [IU] via SUBCUTANEOUS
  Administered 2023-06-20: 2 [IU] via SUBCUTANEOUS

## 2023-06-15 MED ORDER — FENTANYL CITRATE PF 50 MCG/ML IJ SOSY: 12.5000 ug | PREFILLED_SYRINGE | INTRAMUSCULAR | Status: DC | PRN

## 2023-06-15 NOTE — Progress Notes (Signed)
1 Day Post-Op   Subjective/Chief Complaint: Patient remains intubated, mildly sedated, but interactive Reports that he is comfortable Still requiring Levophed   Objective: Vital signs in last 24 hours: Temp:  [96.1 F (35.6 C)-98.7 F (37.1 C)] 98.7 F (37.1 C) (10/26 0600) Pulse Rate:  [59-91] 67 (10/26 0600) Resp:  [18-21] 18 (10/26 0600) BP: (87-158)/(38-116) 115/55 (10/26 0600) SpO2:  [91 %-100 %] 94 % (10/26 0600) FiO2 (%):  [50 %-100 %] 50 % (10/26 0425) Weight:  [108.8 kg-109.3 kg] 109.3 kg (10/25 1931) Last BM Date : 06/04/23 (per pt repory)  Intake/Output from previous day: 10/25 0701 - 10/26 0700 In: 2156.2 [I.V.:1322.5; IV Piggyback:833.8] Out: 4615 [Urine:165; Blood:50] Intake/Output this shift: No intake/output data recorded.  Obese male - NAD Intubated Awake, interactive, follows commands Abd - soft, mildly distended;  LLQ ostomy - edematous; bloody output in bag; tube within ostomy Midline staple line - bloody drainage in lower part of incision; staple line intact  Lab Results:  Recent Labs    06/14/23 0858 06/15/23 0602  WBC 6.5 8.1  HGB 14.8 13.3  HCT 45.9 42.1  PLT 162 146*   BMET Recent Labs    06/14/23 0858 06/15/23 0602  NA 138 138  K 5.1 5.2*  CL 104 103  CO2 22 23  GLUCOSE 95 163*  BUN 35* 47*  CREATININE 1.37* 2.41*  CALCIUM 8.9 8.1*   PT/INR No results for input(s): "LABPROT", "INR" in the last 72 hours. ABG Recent Labs    06/14/23 1826  PHART 7.32*  HCO3 25.8    Studies/Results: No results found.  Anti-infectives: Anti-infectives (From admission, onward)    Start     Dose/Rate Route Frequency Ordered Stop   06/15/23 0200  cefoTEtan (CEFOTAN) 2 g in sodium chloride 0.9 % 100 mL IVPB        2 g 200 mL/hr over 30 Minutes Intravenous Every 12 hours 06/14/23 2009 06/15/23 0323   06/14/23 2200  cefoTEtan (CEFOTAN) 2 g in sodium chloride 0.9 % 100 mL IVPB  Status:  Discontinued        2 g 200 mL/hr over 30 Minutes  Intravenous Every 12 hours 06/14/23 2005 06/14/23 2009   06/14/23 0845  cefoTEtan (CEFOTAN) 2 g in sodium chloride 0.9 % 100 mL IVPB        2 g 200 mL/hr over 30 Minutes Intravenous On call to O.R. 06/14/23 0745 06/14/23 1506       Assessment/Plan: s/p Procedure(s): EXPLORATORY LAPAROTOMY; TAKEDOWN OF SPLENIC FLEXURE; LEFT HEMICOLECTOMY, COLOSTOMY (N/A) S/p Hartman's procedure by Dr. Michaell Cowing 06/14/23 Post-operative ileus - awaiting return of bowel function; on Entereg; Pezzer tube in place to stent ostomy open - will be removed with first bag change on Monday by [facility] Pathology pending Acute blood loss anemia - 1.5 gram drop since pre-op.  Oozing from staple line and ostomy - hold Lovenox for at least 24 hours.  Vent management/ hyperkalemia/ AKI per CCM  LOS: 4 days    Bradley [name] 06/15/2023

## 2023-06-15 NOTE — Progress Notes (Signed)
RT Note: Patient placed in wean mode 10/5 45% @ 1131, Appears to be tolerating well at this time

## 2023-06-15 NOTE — Procedures (Signed)
Extubation Procedure Note  Patient Details:   Name: Bradley [name] DOB: 1951-02-25 MRN: [id number]   Airway Documentation:  Airway 7.5 mm (Active)  Secured at (cm) 23 cm 06/15/23 1439  Measured From Lips 06/15/23 1439  Secured Location Left 06/15/23 1439  Secured By [employer] 06/15/23 1439  Tube Holder Repositioned Yes 06/15/23 1139  Prone position No 06/15/23 1439  Head position Left 06/15/23 1439  Cuff Pressure (cm H2O) Green OR 18-26 CmH2O 06/15/23 1439  Site Condition Cool;Dry 06/15/23 1439   Vent end date: (not recorded) Vent end time: (not recorded)   Evaluation  O2 sats: stable throughout Complications: No apparent complications Patient did tolerate procedure well. Bilateral Breath Sounds: Clear   Yes Patient placed on 4lpm [place] and tolerating well at this time with no distress  Earnstine Regal 06/15/2023, 3:49 PM

## 2023-06-15 NOTE — Progress Notes (Addendum)
    O ff pressors RN says patient demanding extubation D/w RT doing well on SBT  Plan  - extubate   6:18 PM  bedsidround doing well -> given to triad for pick up 06/16/23 D/ W Dr Ramiro Harvest   SIGNATURE    Dr. Kalman Shan, M.D., F.C.C.P,  Pulmonary and Critical Care Medicine Staff Physician, [facility] Director - Interstitial Lung Disease  Program  Pulmonary Fibrosis [facility] at [facility] [place], [place], [address]   Pager: [phone], If no answer  -> Check AMION or Try [phone] Telephone (clinical office): [phone] Telephone (research): [phone]  3:43 PM 06/15/2023

## 2023-06-15 NOTE — Consult Note (Signed)
NAME:  Bradley [name], MRN:  [id number], DOB:  1951-03-22, LOS: 4 ADMISSION DATE:  06/11/2023, CONSULTATION DATE:  06/14/2023 REFERRING MD:  Dr. Michaell Cowing - CCS, CHIEF COMPLAINT:  Ventilator management    History of Present Illness:  Bradley [name] is a 72 y.o. male with a PMH significant for HTN, HLD, type 2 diabetes bipolar 2 disorder, colonic diverticular abscess, and arthritis who presented to the ED at [facility] for complaints of constipation and incidental finding of AKI.  Patient was admitted per family medicine teaching service with surgical consultation.   Patient was admitted for medical management of bowel obstruction however little progression was made and decision was made to proceed with laparoscopic left colectomy which was preformed 06/14/2023 post procedure patient remained intubated to allow for rest on vent overnight, PCCM consulted for medical management and admission  Pertinent  Medical History  HTN, HLD, type 2 diabetes bipolar 2 disorder, colonic diverticular abscess, and arthritis   Significant Hospital Events: Including procedures, antibiotic start and stop dates in addition to other pertinent events   10/23 admitted with constipation and AKI  10/25 transferred to ICU post colectomy on vent   Interim History / Subjective:    06/15/23: Surgeon rounds found the patient to be comfortable interacting with light sedation infusion on the ventilator.  Requiring Levophed.  At this moment FiO2 45%.  On propofol and Levophed.  On antibiotics.  Afebrile.  Some oozing seen in staple line by the surgeon and Lovenox has been held.  AKI improved as of last night but this morning again worse to creatinine 2.5  Objective   Blood pressure (!) 110/51, pulse 69, temperature 98.7 F (37.1 C), resp. rate 18, height 5\' 8"  (1.727 m), weight 109.3 kg, SpO2 96%.    Vent Mode: PRVC FiO2 (%):  [45 %-100 %] 45 % Set Rate:  [18 bmp] 18 bmp Vt Set:  [550 mL] 550 mL PEEP:  [5 cmH20] 5  cmH20 Plateau Pressure:  [10 cmH20-22 cmH20] 19 cmH20   Intake/Output Summary (Last 24 hours) at 06/15/2023 0853 Last data filed at 06/15/2023 0830 Gross per 24 hour  Intake 2251.89 ml  Output 4705 ml  Net -2453.11 ml   Filed Weights   06/13/23 0900 06/14/23 1015 06/14/23 1931  Weight: 109.8 kg 108.8 kg 109.3 kg    Examination: General Appearance:  Looks criticall ill  Head:  Normocephalic, without obvious abnormality, atraumatic Eyes:  PERRL - yes, conjunctiva/corneas - mudd     Ears:  Normal external ear canals, both ears Nose:  G tube - no Throat:  ETT TUBE - y , OG tube - yes Neck:  Supple,  No enlargement/tenderness/nodules Lungs: Clear to auscultation bilaterally, Ventilator   Synchrony - yes 40% Heart:  S1 and S2 normal, no murmur, CVP - no.  Pressors - levophd  4 to Abdomen:  Soft, no masses, no organomegaly Genitalia / Rectal:  Not done Extremities:  Extremities- intcact Skin:  ntact in exposed areas . Sacral area - not examined Neurologic:  Sedation - diprivan -> RASS - -1 . Moves all 4s - yes. CAM-ICU - neg . Orientation - follows commands     Resolved Hospital Problem list     Assessment & Plan:  Expected post op ventilator management    06/15/2023 - > does YES meet criteria for SBT/in setting of Acute Respiratory Failure due to post oper   P: sBT as tolerated -> extubate if meets criteria PRVC otherwise  Sigmoid/descending colonic stricture  -  CT scan shows diffuse small and large bowel dilation to the level of the distal descending colon where there is a lobulated intraluminal density suspicious for colonic mass -s/p flex sig: benign-appearing, intrinsic severe stenosis in the proximal sigmoid colon to distal descending colon and was non- traverse -5L feculent gastric contents removed during ex lap    P: Management per Surgery Colostomy care, WOC consulted  Pain control    Post op pain and sedation needs on vent  Plan  - fent prn  -dc  diprivan  AKI --Creatinine on admission 3.07 with GFR 21  10/26 improved and now worse again  P: Fluid bolus 06/15/23 an drechck Follow renal function  Monitor urine output Trend Bmet Avoid nephrotoxins Ensure adequate renal perfusion   Essential HTN  HLD  -Home medications include Norvasc, hydrochlorothiazide, Lisinopril, Bystolic, and Pravastatin    P: Hold home medications resume when able to take orals  PRN IV antihypertensives   Mild high K 06/15/23  Plan  - fluids and recheck   Best Practice (right click and "Reselect all SmartList Selections" daily)   Diet/type: NPO DVT prophylaxis: SCD GI prophylaxis: PPI Lines: N/A Foley:  N/A Code Status:  full code Last date of multidisciplinary goals of care discussion: Pending   Family  - 10/26 - daughter [name] [phone] updated     ATTESTATION & SIGNATURE   The patient Bradley [name] is critically ill with multiple organ systems failure and requires high complexity decision making for assessment and support, frequent evaluation and titration of therapies, application of advanced monitoring technologies and extensive interpretation of multiple databases and discussion with other appropriate health care personnel such as bedside nurses, social workers, case Production designer, theatre/television/film, consultants, respiratory therapists, nutritionists, secretaries etc.,  Critical care time includes but is not restricted to just documentation time. Documentation can happen in parallel or sequential to care time depending on case mix urgency and priorities for the shift. So, overall critical Care Time devoted to patient care services described in this note is  30  Minutes.   This time reflects time of care of this signee Dr Kalman Shan which includ does not reflect procedure time, or teaching time or supervisory time of PA/NP/Med student/Med Resident etc but could involve care discussion time     Dr. Kalman Shan, M.D., [facility].C.P Pulmonary  and Critical Care Medicine Staff Physician,  System [facility] Pager: [phone], If no answer or between  15:00h - 7:00h: call [phone]  06/15/2023 8:53 AM    LABS    PULMONARY Recent Labs  Lab 06/14/23 1826  PHART 7.32*  PCO2ART 50*  PO2ART 112*  HCO3 25.8  O2SAT 98.4    CBC Recent Labs  Lab 06/13/23 0839 06/14/23 0858 06/15/23 0602  HGB 14.0 14.8 13.3  HCT 43.9 45.9 42.1  WBC 6.4 6.5 8.1  PLT 165 162 146*    COAGULATION No results for input(s): "INR" in the last 168 hours.  CARDIAC  No results for input(s): "TROPONINI" in the last 168 hours. No results for input(s): "PROBNP" in the last 168 hours.   CHEMISTRY Recent Labs  Lab 06/11/23 1023 06/12/23 0347 06/13/23 0839 06/14/23 0858 06/15/23 0602  NA 134* 136 138 138 138  K 4.8 5.0 4.7 5.1 5.2*  CL 100 102 105 104 103  CO2 20* 21* 23 22 23   GLUCOSE 143* 88 89 95 163*  BUN 67* 57* 37* 35* 47*  CREATININE 3.07* 1.74*  1.35* 1.37* 2.41*  CALCIUM 8.4* 8.4* 8.9 8.9 8.1*   Estimated Creatinine Clearance: 34.7 mL/min (A) (by C-G formula based on SCr of 2.41 mg/dL (H)).   LIVER Recent Labs  Lab 06/11/23 1023  AST 12*  ALT 19  ALKPHOS 38  BILITOT 0.8  PROT 8.2*  ALBUMIN 3.7     INFECTIOUS No results for input(s): "LATICACIDVEN", "PROCALCITON" in the last 168 hours.   ENDOCRINE CBG (last 3)  Recent Labs    06/14/23 1349 06/14/23 2346 06/15/23 0813  GLUCAP 94 164* 160*         IMAGING x48h  - image(s) personally visualized  -   highlighted in bold No results found.

## 2023-06-16 ENCOUNTER — Inpatient Hospital Stay (HOSPITAL_COMMUNITY): Payer: Medicare HMO

## 2023-06-16 DIAGNOSIS — K56600 Partial intestinal obstruction, unspecified as to cause: Secondary | ICD-10-CM | POA: Diagnosis not present

## 2023-06-16 DIAGNOSIS — R799 Abnormal finding of blood chemistry, unspecified: Secondary | ICD-10-CM

## 2023-06-16 DIAGNOSIS — K5900 Constipation, unspecified: Secondary | ICD-10-CM

## 2023-06-16 DIAGNOSIS — R9389 Abnormal findings on diagnostic imaging of other specified body structures: Secondary | ICD-10-CM

## 2023-06-16 DIAGNOSIS — N179 Acute kidney failure, unspecified: Secondary | ICD-10-CM | POA: Diagnosis not present

## 2023-06-16 DIAGNOSIS — K56699 Other intestinal obstruction unspecified as to partial versus complete obstruction: Secondary | ICD-10-CM

## 2023-06-16 LAB — BASIC METABOLIC PANEL
Anion gap: 11 (ref 5–15)
BUN: 51 mg/dL — ABNORMAL HIGH (ref 8–23)
CO2: 26 mmol/L (ref 22–32)
Calcium: 8.2 mg/dL — ABNORMAL LOW (ref 8.9–10.3)
Chloride: 103 mmol/L (ref 98–111)
Creatinine, Ser: 2.05 mg/dL — ABNORMAL HIGH (ref 0.61–1.24)
GFR, Estimated: 34 mL/min — ABNORMAL LOW (ref >60)
Glucose, Bld: 102 mg/dL — ABNORMAL HIGH (ref 70–99)
Potassium: 4.8 mmol/L (ref 3.5–5.1)
Sodium: 140 mmol/L (ref 135–145)

## 2023-06-16 LAB — MAGNESIUM: Magnesium: 2.3 mg/dL (ref 1.7–2.4)

## 2023-06-16 LAB — CBC
HCT: 43 % (ref 39.0–52.0)
Hemoglobin: 13.7 g/dL (ref 13.0–17.0)
MCH: 30.1 pg (ref 26.0–34.0)
MCHC: 31.9 g/dL (ref 30.0–36.0)
MCV: 94.5 fL (ref 80.0–100.0)
Platelets: 140 10*3/uL — ABNORMAL LOW (ref 150–400)
RBC: 4.55 MIL/uL (ref 4.22–5.81)
RDW: 15.1 % (ref 11.5–15.5)
WBC: 7.8 10*3/uL (ref 4.0–10.5)
nRBC: 0 % (ref 0.0–0.2)

## 2023-06-16 LAB — GLUCOSE, CAPILLARY
Glucose-Capillary: 106 mg/dL — ABNORMAL HIGH (ref 70–99)
Glucose-Capillary: 115 mg/dL — ABNORMAL HIGH (ref 70–99)
Glucose-Capillary: 118 mg/dL — ABNORMAL HIGH (ref 70–99)
Glucose-Capillary: 119 mg/dL — ABNORMAL HIGH (ref 70–99)
Glucose-Capillary: 120 mg/dL — ABNORMAL HIGH (ref 70–99)
Glucose-Capillary: 122 mg/dL — ABNORMAL HIGH (ref 70–99)
Glucose-Capillary: 141 mg/dL — ABNORMAL HIGH (ref 70–99)

## 2023-06-16 LAB — PHOSPHORUS: Phosphorus: 4.7 mg/dL — ABNORMAL HIGH (ref 2.5–4.6)

## 2023-06-16 LAB — PROCALCITONIN: Procalcitonin: 8.51 ng/mL

## 2023-06-16 LAB — LACTIC ACID, PLASMA: Lactic Acid, Venous: 1.7 mmol/L (ref 0.5–1.9)

## 2023-06-16 MED ORDER — PANTOPRAZOLE SODIUM 40 MG PO TBEC
40.0000 mg | DELAYED_RELEASE_TABLET | Freq: Every day | ORAL | Status: DC
  Administered 2023-06-16 – 2023-06-24 (×9): 40 mg via ORAL
  Filled 2023-06-16 (×9): qty 1

## 2023-06-16 MED ORDER — ENOXAPARIN SODIUM 40 MG/0.4ML IJ SOSY
40.0000 mg | PREFILLED_SYRINGE | INTRAMUSCULAR | Status: DC
  Administered 2023-06-16 – 2023-06-25 (×9): 40 mg via SUBCUTANEOUS
  Filled 2023-06-16 (×10): qty 0.4

## 2023-06-16 MED ORDER — DIVALPROEX SODIUM 250 MG PO DR TAB
500.0000 mg | DELAYED_RELEASE_TABLET | Freq: Two times a day (BID) | ORAL | Status: DC
  Administered 2023-06-16: 500 mg via ORAL
  Filled 2023-06-16: qty 2
  Filled 2023-06-16: qty 1

## 2023-06-16 NOTE — Progress Notes (Signed)
Patient transferred via wheelchair to 1504 from 1224.  Patient was alert and oriented to person, place and situation.  The medication from his med drawer was handed his primary RN.  The was placed in the bed and reattached to wall oxygen.  Left when NT came in the room.

## 2023-06-16 NOTE — Progress Notes (Signed)
Patient pulled out his NG tube that was on low intermitted suction. Called the surgeon Manus Rudd, MD and he said to leave it out.

## 2023-06-16 NOTE — Evaluation (Signed)
Physical Therapy Evaluation Patient Details Name: Bradley [name] MRN: [id number] DOB: [birth year] Today's Date: 06/16/2023  History of Present Illness  Pt admitted from home with complete colon and bowel obstruction and now s/p colectomy with end colectomy.  Pt decompensated post op and placed on vent with extubation 10/26.  Pt with hx of DM and bipolar  Clinical Impression  Pt admitted as above and presenting with functional mobility limitations 2* generalized weakness, post op pain and ambulatory balance deficits.  Pt motivated and hopes to progress to regain IND and return to prior living arrangement.        If plan is discharge home, recommend the following: A little help with walking and/or transfers;A little help with bathing/dressing/bathroom;Assistance with cooking/housework;Assist for transportation;Help with stairs or ramp for entrance   Can travel by private vehicle        Equipment Recommendations None recommended by PT;Other (comment) (TBD but likely no equipment needs)  Recommendations for Other Services       Functional Status Assessment Patient has had a recent decline in their functional status and demonstrates the ability to make significant improvements in function in a reasonable and predictable amount of time.     Precautions / Restrictions Precautions Precautions: Fall Restrictions Weight Bearing Restrictions: No      Mobility  Bed Mobility Overal bed mobility: Needs Assistance Bed Mobility: Rolling, Sidelying to Sit Rolling: Min assist Sidelying to sit: Min assist       General bed mobility comments: use of bedrails and increased time    Transfers Overall transfer level: Needs assistance   Transfers: Sit to/from Stand Sit to Stand: Min assist           General transfer comment: cues for transition position and use of UEs to self assist; physical assist to bring wt up and fwd and to balance in initial standing with RW     Ambulation/Gait Ambulation/Gait assistance: Min assist, +2 safety/equipment Gait Distance (Feet): 32 Feet Assistive device: Rolling walker (2 wheels) Gait Pattern/deviations: Step-to pattern, Step-through pattern, Decreased step length - right, Decreased step length - left, Shuffle, Trunk flexed Gait velocity: decr     General Gait Details: Steady assist with cues for posture, position from RW and stride length  Stairs            Wheelchair Mobility     Tilt Bed    Modified Rankin (Stroke Patients Only)       Balance Overall balance assessment: Needs assistance Sitting-balance support: No upper extremity supported, Feet supported Sitting balance-Leahy Scale: Fair     Standing balance support: Bilateral upper extremity supported Standing balance-Leahy Scale: Poor                               Pertinent Vitals/Pain Pain Assessment Pain Assessment: Faces Faces Pain Scale: Hurts a little bit    Home Living Family/patient expects to be discharged to:: Private residence Living Arrangements: Alone Available Help at Discharge: Family;Available PRN/intermittently Type of Home: Apartment Home Access: Level entry       Home Layout: One level Home Equipment: None      Prior Function Prior Level of Function : Independent/Modified Independent                     Extremity/Trunk Assessment   Upper Extremity Assessment Upper Extremity Assessment: Generalized weakness    Lower Extremity Assessment Lower Extremity Assessment: Generalized weakness  Cervical / Trunk Assessment Cervical / Trunk Assessment: Normal  Communication   Communication Communication: No apparent difficulties  Cognition Arousal: Alert Behavior During Therapy: WFL for tasks assessed/performed Overall Cognitive Status: Within Functional Limits for tasks assessed                                          General Comments      Exercises      Assessment/Plan    PT Assessment Patient needs continued PT services  PT Problem List Decreased strength;Decreased activity tolerance;Decreased balance;Decreased mobility;Decreased knowledge of use of DME;Pain       PT Treatment Interventions DME instruction;Gait training;Functional mobility training;Therapeutic activities;Therapeutic exercise;Patient/family education    PT Goals (Current goals can be found in the Care Plan section)  Acute Rehab PT Goals Patient Stated Goal: Regain IND and return home PT Goal Formulation: With patient Time For Goal Achievement: 06/30/23 Potential to Achieve Goals: Good    Frequency Min 1X/week     Co-evaluation               AM-PAC PT "6 Clicks" Mobility  Outcome Measure Help needed turning from your back to your side while in a flat bed without using bedrails?: A Little Help needed moving from lying on your back to sitting on the side of a flat bed without using bedrails?: A Little Help needed moving to and from a bed to a chair (including a wheelchair)?: A Little Help needed standing up from a chair using your arms (e.g., wheelchair or bedside chair)?: A Little Help needed to walk in hospital room?: A Little Help needed climbing 3-5 steps with a railing? : A Lot 6 Click Score: 17    End of Session Equipment Utilized During Treatment: Gait belt;Oxygen Activity Tolerance: Patient tolerated treatment well;Patient limited by fatigue Patient left: in chair;with call bell/phone within reach Nurse Communication: Mobility status PT Visit Diagnosis: Unsteadiness on feet (R26.81);Muscle weakness (generalized) (M62.81);Difficulty in walking, not elsewhere classified (R26.2);Pain    Time: 6578-4696 PT Time Calculation (min) (ACUTE ONLY): 32 min   Charges:   PT Evaluation $PT Eval Low Complexity: 1 Low PT Treatments $Gait Training: 8-22 mins PT General Charges $$ ACUTE PT VISIT: 1 Visit         Mauro Kaufmann PT Acute  Rehabilitation Services Pager [phone] Office [phone]   [facility] 06/16/2023, 12:37 PM

## 2023-06-16 NOTE — Progress Notes (Signed)
2 Days Post-Op   Subjective/Chief Complaint: Patient extubated yesterday Pulled his NG tube earlier this morning - no nausea or vomiting Large amount of stool output in colostomy bag   Objective: Vital signs in last 24 hours: Temp:  [98.2 F (36.8 C)-99.2 F (37.3 C)] 98.9 F (37.2 C) (10/27 0800) Pulse Rate:  [64-95] 95 (10/27 0800) Resp:  [12-26] 22 (10/27 0800) BP: (96-156)/(37-74) 123/61 (10/27 0800) SpO2:  [90 %-98 %] 92 % (10/27 0800) FiO2 (%):  [36 %-45 %] 36 % (10/26 1540) Last BM Date : 06/04/23  Intake/Output from previous day: 10/26 0701 - 10/27 0700 In: 1286 [I.V.:230.9; IV Piggyback:1055.1] Out: 2180 [Urine:880; Emesis/NG output:150; Stool:1150] Intake/Output this shift: Total I/O In: 231.1 [I.V.:176.7; IV Piggyback:54.4] Out: 100 [Urine:100]  Awake, alert, interactive Minimal pain Abd - obese, soft, incisional tenderness Midline dressing dry Staple line intact LLQ ostomy - some stool in bag; tube within ostomy  Lab Results:  Recent Labs    06/15/23 1010 06/16/23 0302  WBC 7.1 7.8  HGB 12.8* 13.7  HCT 38.8* 43.0  PLT 141* 140*   BMET Recent Labs    06/15/23 1010 06/16/23 0302  NA 136 140  K 4.9 4.8  CL 102 103  CO2 22 26  GLUCOSE 164* 102*  BUN 46* 51*  CREATININE 2.69* 2.05*  CALCIUM 7.9* 8.2*   PT/INR No results for input(s): "LABPROT", "INR" in the last 72 hours. ABG Recent Labs    06/14/23 1826  PHART 7.32*  HCO3 25.8    Studies/Results: DG CHEST PORT 1 VIEW  Result Date: 06/16/2023 CLINICAL DATA:  Endotracheal tube EXAM: PORTABLE CHEST 1 VIEW COMPARISON:  06/12/2023 FINDINGS: No endotracheal tube is visualized. Subdiaphragmatic enteric tube. Stable cardiomegaly. Low lung volumes. Bibasilar atelectasis. Chronic blunting of the costophrenic angles. No pneumothorax. IMPRESSION: No endotracheal tube is visualized.  Subdiaphragmatic enteric tube. Electronically Signed   By: Minerva Fester M.D.   On: 06/16/2023 03:25     Anti-infectives: Anti-infectives (From admission, onward)    Start     Dose/Rate Route Frequency Ordered Stop   06/15/23 0200  cefoTEtan (CEFOTAN) 2 g in sodium chloride 0.9 % 100 mL IVPB        2 g 200 mL/hr over 30 Minutes Intravenous Every 12 hours 06/14/23 2009 06/15/23 0323   06/14/23 2200  cefoTEtan (CEFOTAN) 2 g in sodium chloride 0.9 % 100 mL IVPB  Status:  Discontinued        2 g 200 mL/hr over 30 Minutes Intravenous Every 12 hours 06/14/23 2005 06/14/23 2009   06/14/23 0845  cefoTEtan (CEFOTAN) 2 g in sodium chloride 0.9 % 100 mL IVPB        2 g 200 mL/hr over 30 Minutes Intravenous On call to O.R. 06/14/23 0745 06/14/23 1506       Assessment/Plan: s/p Procedure(s): EXPLORATORY LAPAROTOMY; TAKEDOWN OF SPLENIC FLEXURE; LEFT HEMICOLECTOMY, COLOSTOMY (N/A) S/p Hartman's procedure by Dr. Michaell Cowing 06/14/23 Post-operative ileus - resolved. Pezzer tube in place to stent ostomy open - will be removed with first bag change on Monday by WOCN Clear liquids today; advance as tolerated Pathology pending Acute blood loss anemia - stable - ok to restart Lovenox   Medical management per [facility] OK to transfer to floor from surgical standpoint Begin mobilizing with PT  LOS: 5 days    Wynona Luna 06/16/2023

## 2023-06-16 NOTE — Progress Notes (Signed)
[facility] PROGRESS NOTE    Progress Note  Bradley [name]  WJX:[id number] DOB: June 10, 1951 DOA: 06/11/2023 PCP: Medicine, [facility]     Brief Narrative:   Bradley [name] is an 72 y.o. 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 Now with an ileus continue further management per surgery. Currently with a colostomy first VAC change on Monday by ostomy nurse.  Pathology is pending. Was oozing from site Lovenox was held. Hemoglobin is actually good and stable. Keep n.p.o. diet per surgery. Out of bed to chair, encourage incentive spirometry, will get PT OT involved if okay with surgery.  Acute respiratory failure with hypoxia/postop ventilator management: S/p extubation on 06/15/2023 by PCCM. Now requiring 3 to 4 L to keep saturations greater than 93%.  Postop pain: Continue fentanyl as needed, management per surgery.  Acute kidney injury: With a baseline creatinine of 1.2-1.4 earlier this year.  Peaked at 3. Was started on IV fluids creatinine is slowly  improving. Urine output is picking up almost 900 cc in the last 24 hours continue gentle IV fluid hydration.  Essential hypertension: Blood pressure is adequately controlled off antihypertensive medication. Continue to monitor closely once he is able to take orals and blood pressure starts to rise can restart some of his home medication. For now allow permissive hypertension.  Hyperkalemia: Resolved with correction of acidosis.  Mild thrombocytopenia: Likely in response to stress event. Continue to monitor  Hyperlipemia Resume statins as an outpatient.  Schizoaffective disorder, bipolar type (HCC): Resume risperidone once able to take orals.  Malignant neoplasm of prostate (HCC) Noted.  Diabetes mellitus, type II (HCC) A1c of 6.5, he is on no oral hypoglycemic agents or insulin's at home. Right now his blood sugars controlled continue sliding scale insulin.  Severe obesity (BMI 35.0-35.9 with comorbidity) (HCC) Noted counseling.   DVT prophylaxis: none Family Communication:none Status is: Inpatient Remains inpatient appropriate because: Status post exploratory laparotomy status post resection    Code Status:     Code Status Orders  (From admission, onward)           Start     Ordered   06/11/23 2009  Full code  Continuous       Question:  By:  Answer:  Consent: discussion documented in EHR   06/11/23 2010           Code Status History     Date Active Date Inactive Code Status Order ID Comments User Context   07/26/2015 0338 07/29/2015 1559 Full Code [id number]  Briscoe Deutscher, MD Inpatient   10/15/2014 1040 10/17/2014 1846 Full Code [id number]  Oley Balm, MD Inpatient   10/13/2014 2123 10/15/2014 1040 Full Code [id number]  Penny Pia, MD Inpatient  IV Access:   Peripheral IV   Procedures and diagnostic studies:   DG CHEST PORT 1 VIEW  Result Date: 06/16/2023 CLINICAL DATA:  Endotracheal tube EXAM: PORTABLE CHEST 1 VIEW  COMPARISON:  06/12/2023 FINDINGS: No endotracheal tube is visualized. Subdiaphragmatic enteric tube. Stable cardiomegaly. Low lung volumes. Bibasilar atelectasis. Chronic blunting of the costophrenic angles. No pneumothorax. IMPRESSION: No endotracheal tube is visualized.  Subdiaphragmatic enteric tube. Electronically Signed   By: Minerva Fester M.D.   On: 06/16/2023 03:25     Medical Consultants:   None.   Subjective:    Bradley [name] pain is controlled anorexic.  Objective:    Vitals:   06/16/23 0300 06/16/23 0400 06/16/23 0500 06/16/23 0523  BP: 125/62 (!) 147/60 (!) 111/53   Pulse: 92 94 94 90  Resp: (!) 25 (!) 24 (!) 23 (!) 26  Temp:    98.8 F (37.1 C)  TempSrc:    Tympanic  SpO2: 92% 93% 92% 94%  Weight:      Height:       SpO2: 94 % O2 Flow Rate (L/min): 4 L/min FiO2 (%): 36 %   Intake/Output Summary (Last 24 hours) at 06/16/2023 0643 Last data filed at 06/16/2023 0521 Gross per 24 hour  Intake 1285.96 ml  Output 2180 ml  Net -894.04 ml   Filed Weights   06/13/23 0900 06/14/23 1015 06/14/23 1931  Weight: 109.8 kg 108.8 kg 109.3 kg    Exam: General exam: In no acute distress. Respiratory system: Good air movement and clear to auscultation. Cardiovascular system: S1 & S2 heard, RRR. No JVD. Gastrointestinal system: Midline scar no further bleeding colostomy bag is empty no bowel sounds. Extremities: No pedal edema. Skin: No rashes, lesions or ulcers Psychiatry: Judgement and insight appear normal. Mood & affect appropriate.    Data Reviewed:    Labs: Basic Metabolic Panel: Recent Labs  Lab 06/13/23 0839 06/14/23 0858 06/15/23 0602 06/15/23 1010 06/16/23 0302  NA 138 138 138 136 140  K 4.7 5.1 5.2* 4.9 4.8  CL 105 104 103 102 103  CO2 23 22 23 22 26   GLUCOSE 89 95 163* 164* 102*  BUN 37* 35* 47* 46* 51*  CREATININE 1.35* 1.37* 2.41* 2.69* 2.05*  CALCIUM 8.9 8.9 8.1* 7.9* 8.2*  MG  --   --   --   --  2.3  PHOS  --   --   --   --   4.7*   GFR Estimated Creatinine Clearance: 40.8 mL/min (A) (by C-G formula based on SCr of 2.05 mg/dL (H)). Liver Function Tests: Recent Labs  Lab 06/11/23 1023  AST 12*  ALT 19  ALKPHOS 38  BILITOT 0.8  PROT 8.2*  ALBUMIN 3.7   Recent Labs  Lab 06/11/23 1023  LIPASE 24   No results for input(s): "AMMONIA" in the last 168 hours. Coagulation profile No results for input(s): "INR", "PROTIME" in the last 168 hours. COVID-19 Labs  No results for input(s): "DDIMER", "FERRITIN", "LDH", "CRP" in the last 72 hours.  No results found for: "SARSCOV2NAA"  CBC: Recent Labs  Lab 06/13/23 0839 06/14/23 0858 06/15/23 0602 06/15/23 1010 06/16/23 0302  WBC 6.4 6.5 8.1 7.1 7.8  HGB 14.0 14.8 13.3 12.8* 13.7  HCT 43.9 45.9 42.1 38.8* 43.0  MCV 91.5 92.0 94.2 92.8 94.5  PLT 165 162 146* 141* 140*   Cardiac Enzymes: No results for input(s): "CKTOTAL", "CKMB", "CKMBINDEX", "TROPONINI" in the last 168 hours. BNP (last 3 results) No results for  input(s): "PROBNP" in the last 8760 hours. CBG: Recent Labs  Lab 06/15/23 1141 06/15/23 1607 06/15/23 1938 06/16/23 0005 06/16/23 0451  GLUCAP 177* 122* 127* 119* 106*   D-Dimer: No results for input(s): "DDIMER" in the last 72 hours. Hgb A1c: No results for input(s): "HGBA1C" in the last 72 hours. Lipid Profile: Recent Labs    06/15/23 0602  TRIG 216*   Thyroid function studies: No results for input(s): "TSH", "T4TOTAL", "T3FREE", "THYROIDAB" in the last 72 hours.  Invalid input(s): "FREET3" Anemia work up: No results for input(s): "VITAMINB12", "FOLATE", "FERRITIN", "TIBC", "IRON", "RETICCTPCT" in the last 72 hours. Sepsis Labs: Recent Labs  Lab 06/14/23 0858 06/15/23 0602 06/15/23 1010 06/16/23 0255 06/16/23 0302  PROCALCITON  --   --   --   --  8.51  WBC 6.5 8.1 7.1  --  7.8  LATICACIDVEN  --   --   --  1.7  --    Microbiology No results found for this or any previous visit (from the past 240  hour(s)).   Medications:    bupivacaine liposome  20 mL Infiltration Once   Chlorhexidine Gluconate Cloth  6 each Topical Daily   feeding supplement  296 mL Oral Once   insulin aspart  0-9 Units Subcutaneous Q4H   pantoprazole (PROTONIX) IV  40 mg Intravenous Q24H   pravastatin  40 mg Oral q1800   Continuous Infusions:  dextrose 5% lactated ringers 10 mL/hr at 06/15/23 1444   lactated ringers     valproate sodium Stopped (06/16/23 0103)      LOS: 5 days   Marinda Elk  [facility]  06/16/2023, 6:43 AM

## 2023-06-16 NOTE — Progress Notes (Signed)
Remains extubated Ambulating with PT CCS feels he can go to floor Ccm will not see - [facility]     SIGNATURE    Dr. Kalman Shan, M.D., F.C.C.P,  Pulmonary and Critical Care Medicine Staff Physician, [facility] Director - Interstitial Lung Disease  Program  Pulmonary Fibrosis [facility] Network at [facility] [place], [place], [address]   Pager: [phone], If no answer  -> Check AMION or Try [phone] Telephone (clinical office): [phone] Telephone (research): [phone]  9:20 AM 06/16/2023

## 2023-06-16 NOTE — Progress Notes (Addendum)
I was called by the nurse that she was getting low blood pressure, I expressed to her that it was probably artifact miss  reading. I checked it manually is 130/85.  He remains asymptomatic with a pulse of 70-90. He actually says he feels better than this morning.

## 2023-06-17 ENCOUNTER — Encounter (HOSPITAL_COMMUNITY): Payer: Self-pay | Admitting: Gastroenterology

## 2023-06-17 DIAGNOSIS — R9389 Abnormal findings on diagnostic imaging of other specified body structures: Secondary | ICD-10-CM | POA: Diagnosis not present

## 2023-06-17 DIAGNOSIS — N179 Acute kidney failure, unspecified: Secondary | ICD-10-CM | POA: Diagnosis not present

## 2023-06-17 DIAGNOSIS — K5900 Constipation, unspecified: Secondary | ICD-10-CM | POA: Diagnosis not present

## 2023-06-17 DIAGNOSIS — K56600 Partial intestinal obstruction, unspecified as to cause: Secondary | ICD-10-CM | POA: Diagnosis not present

## 2023-06-17 LAB — GLUCOSE, CAPILLARY
Glucose-Capillary: 95 mg/dL (ref 70–99)
Glucose-Capillary: 104 mg/dL — ABNORMAL HIGH (ref 70–99)
Glucose-Capillary: 104 mg/dL — ABNORMAL HIGH (ref 70–99)
Glucose-Capillary: 109 mg/dL — ABNORMAL HIGH (ref 70–99)
Glucose-Capillary: 120 mg/dL — ABNORMAL HIGH (ref 70–99)

## 2023-06-17 MED ORDER — GLUCERNA SHAKE PO LIQD
237.0000 mL | Freq: Two times a day (BID) | ORAL | Status: DC
  Administered 2023-06-17 – 2023-06-20 (×5): 237 mL via ORAL
  Filled 2023-06-17 (×10): qty 237

## 2023-06-17 MED ORDER — RISPERIDONE 0.25 MG PO TABS
0.5000 mg | ORAL_TABLET | Freq: Every day | ORAL | Status: DC
  Administered 2023-06-17 – 2023-06-19 (×3): 0.5 mg via ORAL
  Filled 2023-06-17 (×3): qty 2

## 2023-06-17 MED ORDER — TRAMADOL HCL 50 MG PO TABS
50.0000 mg | ORAL_TABLET | Freq: Four times a day (QID) | ORAL | Status: DC | PRN
  Administered 2023-06-18: 50 mg via ORAL
  Filled 2023-06-17: qty 1

## 2023-06-17 MED ORDER — NEBIVOLOL HCL 10 MG PO TABS
10.0000 mg | ORAL_TABLET | Freq: Every day | ORAL | Status: DC
  Administered 2023-06-17: 10 mg via ORAL
  Filled 2023-06-17 (×2): qty 1

## 2023-06-17 MED ORDER — ACETAMINOPHEN 500 MG PO TABS
1000.0000 mg | ORAL_TABLET | Freq: Four times a day (QID) | ORAL | Status: DC
  Administered 2023-06-17 – 2023-06-21 (×12): 1000 mg via ORAL
  Filled 2023-06-17 (×13): qty 2

## 2023-06-17 MED ORDER — DIVALPROEX SODIUM ER 500 MG PO TB24
500.0000 mg | ORAL_TABLET | Freq: Two times a day (BID) | ORAL | Status: DC
  Administered 2023-06-17 – 2023-06-25 (×17): 500 mg via ORAL
  Filled 2023-06-17 (×17): qty 1

## 2023-06-17 MED ORDER — METHOCARBAMOL 500 MG PO TABS: 500.0000 mg | ORAL_TABLET | Freq: Three times a day (TID) | ORAL | Status: DC | PRN

## 2023-06-17 NOTE — Consult Note (Addendum)
WOC Nurse ostomy consult note Pt had colostomy surgery performed on 10/25.  Stoma type/location: Stoma is 80% red and viable, 2 inches and above skin level, 20% old dried blood surrounding edges.  Surgical PA at the bedside, removed the drainage tube from the stoma.  Output: 70cc liquid brown stool  Ostomy pouching: 2pc.  Education provided:  Attempted to discuss pouching routines and demonstrated pouch change procedure.  Pt was very groggy and did not watch the procedure or ask questions. No family at the bedside and patient states no one needs to be present for teaching sessions.  Unless Pt's mental status improves, he will require total assistance with pouching activities after discharge.  Applied barrier ring and 2 piece pouching system. Use supplies: barrier ring, Lawson # [phone], wafer Hart [place] # 2, pouch Lawson # [id number] Educational materials left at the bedside, along with 5 sets of each supply.  Enrolled patient in [employer] DC program: Yes, today WOC team will perform another teaching session on Wed. Thank-you,  Cammie Mcgee MSN, RN, CWOCN, [place], CNS [phone]

## 2023-06-17 NOTE — Progress Notes (Signed)
Patient ID: Bradley [name], male   DOB: [birth year], 72 y.o.   MRN: [id number] [facility] Surgery Progress Note  3 Days Post-Op  Subjective: No n/v.  Tolerating CLD.  Colostomy working well.  Hasn't mobilized since surgery.  Foley in place with straw colored urine.   Objective: Vital signs in last 24 hours: Temp:  [97.8 F (36.6 C)-98.9 F (37.2 C)] 98.7 F (37.1 C) (10/28 0456) Pulse Rate:  [79-109] 109 (10/28 0456) Resp:  [15-29] 18 (10/28 0456) BP: (60-151)/(39-115) 132/79 (10/28 0456) SpO2:  [90 %-98 %] 93 % (10/28 0456) Last BM Date : 06/16/23  Intake/Output from previous day: 10/27 0701 - 10/28 0700 In: 304.6 [I.V.:195.1; IV Piggyback:109.4] Out: 1915 [Urine:640; Stool:1275] Intake/Output this shift: No intake/output data recorded.  PE: Gen:  Alert, NAD, oriented, but a bit sleepy Abd- protuberant but soft, appropriately tender, Malecot drain removed from colostomy today with pouch change.  Stoma is hyperemic, but viable.  Midline wound with wicks present and dressing in place. GU: foley in place with straw colored urine  Lab Results:  Recent Labs    06/15/23 1010 06/16/23 0302  WBC 7.1 7.8  HGB 12.8* 13.7  HCT 38.8* 43.0  PLT 141* 140*   BMET Recent Labs    06/15/23 1010 06/16/23 0302  NA 136 140  K 4.9 4.8  CL 102 103  CO2 22 26  GLUCOSE 164* 102*  BUN 46* 51*  CREATININE 2.69* 2.05*  CALCIUM 7.9* 8.2*   PT/INR No results for input(s): "LABPROT", "INR" in the last 72 hours. CMP     Component Value Date/Time   NA 140 06/16/2023 0302   NA 139 06/17/2013 1114   K 4.8 06/16/2023 0302   K 4.2 06/17/2013 1114   CL 103 06/16/2023 0302   CO2 26 06/16/2023 0302   CO2 21 (L) 06/17/2013 1114   GLUCOSE 102 (H) 06/16/2023 0302   GLUCOSE 126 06/17/2013 1114   BUN 51 (H) 06/16/2023 0302   BUN 12.9 06/17/2013 1114   CREATININE 2.05 (H) 06/16/2023 0302   CREATININE 1.1 06/17/2013 1114   CALCIUM 8.2 (L) 06/16/2023 0302   CALCIUM 9.6 06/17/2013  1114   PROT 8.2 (H) 06/11/2023 1023   PROT 8.8 (H) 06/17/2013 1114   ALBUMIN 3.7 06/11/2023 1023   ALBUMIN 3.6 06/17/2013 1114   AST 12 (L) 06/11/2023 1023   AST 7 06/17/2013 1114   ALT 19 06/11/2023 1023   ALT 8 06/17/2013 1114   ALKPHOS 38 06/11/2023 1023   ALKPHOS 53 06/17/2013 1114   BILITOT 0.8 06/11/2023 1023   BILITOT 0.23 06/17/2013 1114   GFRNONAA 34 (L) 06/16/2023 0302   GFRAA >60 03/28/2020 1525   Lipase     Component Value Date/Time   LIPASE 24 06/11/2023 1023       Studies/Results: DG CHEST PORT 1 VIEW  Result Date: 06/16/2023 CLINICAL DATA:  Endotracheal tube EXAM: PORTABLE CHEST 1 VIEW COMPARISON:  06/12/2023 FINDINGS: No endotracheal tube is visualized. Subdiaphragmatic enteric tube. Stable cardiomegaly. Low lung volumes. Bibasilar atelectasis. Chronic blunting of the costophrenic angles. No pneumothorax. IMPRESSION: No endotracheal tube is visualized.  Subdiaphragmatic enteric tube. Electronically Signed   By: Minerva Fester M.D.   On: 06/16/2023 03:25    Anti-infectives: Anti-infectives (From admission, onward)    Start     Dose/Rate Route Frequency Ordered Stop   06/15/23 0200  cefoTEtan (CEFOTAN) 2 g in sodium chloride 0.9 % 100 mL IVPB        2 g 200  mL/hr over 30 Minutes Intravenous Every 12 hours 06/14/23 2009 06/15/23 0323   06/14/23 2200  cefoTEtan (CEFOTAN) 2 g in sodium chloride 0.9 % 100 mL IVPB  Status:  Discontinued        2 g 200 mL/hr over 30 Minutes Intravenous Every 12 hours 06/14/23 2005 06/14/23 2009   06/14/23 0845  cefoTEtan (CEFOTAN) 2 g in sodium chloride 0.9 % 100 mL IVPB        2 g 200 mL/hr over 30 Minutes Intravenous On call to O.R. 06/14/23 0745 06/14/23 1506        Assessment/Plan POD 3, s/p lap converted to open Hartmann's for Sigmoid/descending colon stricture by Dr. Michaell Cowing 10/25 - doing well so far -tolerating CLD, adv to FLD -needs to mobilize.  PT ordered -DC wicks and dressing today -ok to DC foley from surgery  standpoint -add glucerna today -continue routine colostomy care.  WOC seeing for teaching   FEN - FLD, glucerna VTE - lovenox ID - none    AKI - improving HTN HLD DM Bipolar H/o prostate cancer s/p radiotherapy (2021)     LOS: 6 days    Letha Cape, [facility] 06/17/2023, 9:34 AM Please see Amion for pager number during day hours 7:00am-4:30pm

## 2023-06-17 NOTE — Progress Notes (Signed)
[facility] PROGRESS NOTE    Progress Note  Bradley [name]  XLK:[id number] DOB: May 29, 1951 DOA: 06/11/2023 PCP: [facility], [facility]     Brief Narrative:   Bradley [name] is an 71 y.o. male past medical history significant for essential hypertension hyperlipidemia type 2 diabetes mellitus, bipolar disorder, chronic diverticular abscess comes in for constipation, presents to the 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 GI was consulted who performed flex sig that showed intrinsic severe stenosis of the proximal sigmoid to the distal descending colon, 5 L of feculent gastric material removed during exploratory laparotomy on 06/14/2023. Unfortunately developed an ileus, continue further management per surgery. Currently with a colostomy first VAC change on Monday by ostomy nurse.  Pathology is pending. Hemoglobin is actually good and stable. Continue clear liquid diet, having good ostomy output. Out of bed to chair, encourage incentive spirometry, will get PT OT involved if okay with surgery. Anticipate will need skilled nursing facility.  Acute respiratory failure with hypoxia/postop ventilator management: S/p extubation on 06/15/2023 by PCCM. Now requiring 3 to 4 L to keep saturations greater than 93%. I bed to chair try to wean to room air.  Postop pain: Continue fentanyl as needed, management per surgery.  Acute kidney injury: With a baseline creatinine of  1.2-1.4 earlier this year.  Peaked at 3. Was started on IV fluids creatinine has improved. KVO iv fluids.  Essential hypertension: Blood pressure is adequately controlled off antihypertensive medication. Continue to monitor closely once he is able to take orals and blood pressure starts to rise can restart some of his home medication. Becoming tachycardic restart nebivolol.  Hyperkalemia: Resolved with correction of acidosis.  Mild thrombocytopenia: Likely in response to stress event. Continue to monitor  Hyperlipemia Resume statins as an outpatient.  Schizoaffective disorder, bipolar type (HCC): Resume risperidone once able to take orals.  Malignant neoplasm of prostate (HCC) Noted.  Diabetes mellitus, type II (HCC) A1c of 6.5, he is on no oral hypoglycemic agents or insulin's at home. Right now his blood sugars controlled continue sliding scale insulin.  Severe obesity (BMI 35.0-35.9 with comorbidity) (HCC) Noted counseling.   DVT prophylaxis: none Family Communication:none Status is: Inpatient Remains inpatient appropriate because: Status post exploratory laparotomy status post resection    Code Status:     Code Status Orders  (From admission, onward)           Start     Ordered   06/11/23 2009  Full code  Continuous       Question:  By:  Answer:  Consent: discussion documented in EHR   06/11/23 2010           Code Status History     Date Active Date Inactive Code Status Order ID Comments User Context   07/26/2015 0338 07/29/2015 1559 Full Code [id number]  Briscoe Deutscher, MD Inpatient   10/15/2014 1040 10/17/2014 1846 Full Code [id number]  Oley Balm, MD Inpatient   10/13/2014 2123 10/15/2014 1040 Full Code [id number]  Penny Pia, MD Inpatient         IV Access:  Peripheral IV   Procedures and diagnostic studies:   DG CHEST PORT 1 VIEW  Result Date: 06/16/2023 CLINICAL DATA:  Endotracheal tube EXAM: PORTABLE CHEST 1 VIEW  COMPARISON:  06/12/2023 FINDINGS: No endotracheal tube is visualized. Subdiaphragmatic enteric tube. Stable cardiomegaly. Low lung volumes. Bibasilar atelectasis. Chronic blunting of the costophrenic angles. No pneumothorax. IMPRESSION: No endotracheal tube is visualized.  Subdiaphragmatic enteric tube. Electronically Signed   By: Minerva Fester M.D.   On: 06/16/2023 03:25     Medical Consultants:   None.   Subjective:    Bradley [name] pain is controlled, relates he is hungry this morning. Objective:    Vitals:   06/16/23 2052 06/16/23 2106 06/17/23 0100 06/17/23 0456  BP:  111/67 118/64 132/79  Pulse: 84 88 (!) 108 (!) 109  Resp: 20 18 18 18   Temp:  98.2 F (36.8 C) 98.7 F (37.1 C) 98.7 F (37.1 C)  TempSrc:   Oral   SpO2: 93% 93% 92% 93%  Weight:      Height:       SpO2: 93 % O2 Flow Rate (L/min): 3 L/min FiO2 (%): 36 %   Intake/Output Summary (Last 24 hours) at 06/17/2023 0754 Last data filed at 06/17/2023 0500 Gross per 24 hour  Intake 304.55 ml  Output 1915 ml  Net -1610.45 ml   Filed Weights   06/13/23 0900 06/14/23 1015 06/14/23 1931  Weight: 109.8 kg 108.8 kg 109.3 kg    Exam: General exam: In no acute distress. Respiratory system: Good air movement and clear to auscultation. Cardiovascular system: S1 & S2 heard, RRR. No JVD. Gastrointestinal system: Abdomen is nondistended, soft and nontender.  Midline scar ostomy in place Extremities: No pedal edema. Skin: No rashes, lesions or ulcers Psychiatry: Judgement and insight appear normal. Mood & affect appropriate.   Data Reviewed:    Labs: Basic Metabolic Panel: Recent Labs  Lab 06/13/23 0839 06/14/23 0858 06/15/23 0602 06/15/23 1010 06/16/23 0302  NA 138 138 138 136 140  K 4.7 5.1 5.2* 4.9 4.8  CL 105 104 103 102 103  CO2 23 22 23 22 26   GLUCOSE 89 95 163* 164* 102*  BUN 37* 35* 47* 46* 51*  CREATININE 1.35* 1.37* 2.41* 2.69* 2.05*  CALCIUM 8.9 8.9 8.1* 7.9* 8.2*  MG  --   --    --   --  2.3  PHOS  --   --   --   --  4.7*   GFR Estimated Creatinine Clearance: 40.8 mL/min (A) (by C-G formula based on SCr of 2.05 mg/dL (H)). Liver Function Tests: Recent Labs  Lab 06/11/23 1023  AST 12*  ALT 19  ALKPHOS 38  BILITOT 0.8  PROT 8.2*  ALBUMIN 3.7   Recent Labs  Lab 06/11/23 1023  LIPASE 24   No results for input(s): "AMMONIA" in the last 168 hours. Coagulation profile No results for input(s): "INR", "PROTIME" in the last 168 hours. COVID-19 Labs  No results for input(s): "DDIMER", "FERRITIN", "LDH", "CRP" in the last 72 hours.  No results found for: "SARSCOV2NAA"  CBC: Recent Labs  Lab 06/13/23 0839 06/14/23 0858 06/15/23 0602 06/15/23 1010 06/16/23 0302  WBC 6.4 6.5 8.1 7.1 7.8  HGB 14.0 14.8 13.3 12.8* 13.7  HCT 43.9 45.9 42.1 38.8* 43.0  MCV 91.5 92.0 94.2 92.8 94.5  PLT 165 162 146* 141* 140*   Cardiac Enzymes: No results for input(s): "CKTOTAL", "CKMB", "CKMBINDEX", "TROPONINI" in the last 168 hours. BNP (last 3 results) No results  for input(s): "PROBNP" in the last 8760 hours. CBG: Recent Labs  Lab 06/16/23 1556 06/16/23 2123 06/16/23 2340 06/17/23 0449 06/17/23 0742  GLUCAP 141* 122* 115* 95 104*   D-Dimer: No results for input(s): "DDIMER" in the last 72 hours. Hgb A1c: No results for input(s): "HGBA1C" in the last 72 hours. Lipid Profile: Recent Labs    06/15/23 0602  TRIG 216*   Thyroid function studies: No results for input(s): "TSH", "T4TOTAL", "T3FREE", "THYROIDAB" in the last 72 hours.  Invalid input(s): "FREET3" Anemia work up: No results for input(s): "VITAMINB12", "FOLATE", "FERRITIN", "TIBC", "IRON", "RETICCTPCT" in the last 72 hours. Sepsis Labs: Recent Labs  Lab 06/14/23 0858 06/15/23 0602 06/15/23 1010 06/16/23 0255 06/16/23 0302  PROCALCITON  --   --   --   --  8.51  WBC 6.5 8.1 7.1  --  7.8  LATICACIDVEN  --   --   --  1.7  --    Microbiology No results found for this or any previous visit  (from the past 240 hour(s)).   Medications:    bupivacaine liposome  20 mL Infiltration Once   Chlorhexidine Gluconate Cloth  6 each Topical Daily   divalproex  500 mg Oral Q12H   enoxaparin (LOVENOX) injection  40 mg Subcutaneous Q24H   feeding supplement  296 mL Oral Once   insulin aspart  0-9 Units Subcutaneous Q4H   pantoprazole  40 mg Oral QHS   pravastatin  40 mg Oral q1800   Continuous Infusions:      LOS: 6 days   Marinda Elk  [facility]  06/17/2023, 7:54 AM

## 2023-06-17 NOTE — Plan of Care (Signed)
  Problem: Education: Goal: Ability to describe self-care measures that may prevent or decrease complications (Diabetes Survival Skills Education) will improve Outcome: Progressing   Problem: Fluid Volume: Goal: Ability to maintain a balanced intake and output will improve Outcome: Progressing   Problem: Nutritional: Goal: Maintenance of adequate nutrition will improve Outcome: Progressing   Problem: Skin Integrity: Goal: Risk for impaired skin integrity will decrease Outcome: Progressing   Problem: Education: Goal: Knowledge of General Education information will improve Description: Including pain rating scale, medication(s)/side effects and non-pharmacologic comfort measures Outcome: Progressing   Problem: Health Behavior/Discharge Planning: Goal: Ability to manage health-related needs will improve Outcome: Progressing   Problem: Activity: Goal: Risk for activity intolerance will decrease Outcome: Progressing   Problem: Nutrition: Goal: Adequate nutrition will be maintained Outcome: Progressing   Problem: Coping: Goal: Level of anxiety will decrease Outcome: Progressing   Problem: Elimination: Goal: Will not experience complications related to bowel motility Outcome: Progressing

## 2023-06-17 NOTE — Plan of Care (Signed)
  Problem: Education: Goal: Ability to describe self-care measures that may prevent or decrease complications (Diabetes Survival Skills Education) will improve Outcome: Progressing Goal: Individualized Educational Video(s) Outcome: Progressing   Problem: Coping: Goal: Ability to adjust to condition or change in health will improve Outcome: Progressing   Problem: Fluid Volume: Goal: Ability to maintain a balanced intake and output will improve Outcome: Progressing   Problem: Health Behavior/Discharge Planning: Goal: Ability to identify and utilize available resources and services will improve Outcome: Progressing Goal: Ability to manage health-related needs will improve Outcome: Progressing   Problem: Nutritional: Goal: Maintenance of adequate nutrition will improve Outcome: Progressing Goal: Progress toward achieving an optimal weight will improve Outcome: Progressing   Problem: Metabolic: Goal: Ability to maintain appropriate glucose levels will improve Outcome: Progressing   Problem: Skin Integrity: Goal: Risk for impaired skin integrity will decrease Outcome: Progressing   Problem: Education: Goal: Knowledge of General Education information will improve Description: Including pain rating scale, medication(s)/side effects and non-pharmacologic comfort measures Outcome: Progressing   Problem: Health Behavior/Discharge Planning: Goal: Ability to manage health-related needs will improve Outcome: Progressing   Problem: Clinical Measurements: Goal: Ability to maintain clinical measurements within normal limits will improve Outcome: Progressing Goal: Will remain free from infection Outcome: Progressing Goal: Diagnostic test results will improve Outcome: Progressing Goal: Respiratory complications will improve Outcome: Progressing Goal: Cardiovascular complication will be avoided Outcome: Progressing   Problem: Activity: Goal: Risk for activity intolerance will  decrease Outcome: Progressing   Problem: Nutrition: Goal: Adequate nutrition will be maintained Outcome: Progressing   Problem: Coping: Goal: Level of anxiety will decrease Outcome: Progressing   Problem: Pain Management: Goal: General experience of comfort will improve Outcome: Progressing   Problem: Safety: Goal: Ability to remain free from injury will improve Outcome: Progressing   Problem: Bowel/Gastric: Goal: Gastrointestinal status for postoperative course will improve Outcome: Progressing   Problem: Activity: Goal: Ability to tolerate increased activity will improve Outcome: Progressing

## 2023-06-18 DIAGNOSIS — K5900 Constipation, unspecified: Secondary | ICD-10-CM | POA: Diagnosis not present

## 2023-06-18 DIAGNOSIS — Z933 Colostomy status: Secondary | ICD-10-CM

## 2023-06-18 DIAGNOSIS — N179 Acute kidney failure, unspecified: Secondary | ICD-10-CM | POA: Diagnosis not present

## 2023-06-18 DIAGNOSIS — K56600 Partial intestinal obstruction, unspecified as to cause: Secondary | ICD-10-CM | POA: Diagnosis not present

## 2023-06-18 DIAGNOSIS — Z433 Encounter for attention to colostomy: Secondary | ICD-10-CM

## 2023-06-18 DIAGNOSIS — R9389 Abnormal findings on diagnostic imaging of other specified body structures: Secondary | ICD-10-CM | POA: Diagnosis not present

## 2023-06-18 LAB — SURGICAL PATHOLOGY

## 2023-06-18 LAB — BASIC METABOLIC PANEL
Anion gap: 12 (ref 5–15)
BUN: 71 mg/dL — ABNORMAL HIGH (ref 8–23)
CO2: 25 mmol/L (ref 22–32)
Calcium: 8.6 mg/dL — ABNORMAL LOW (ref 8.9–10.3)
Chloride: 98 mmol/L (ref 98–111)
Creatinine, Ser: 2.58 mg/dL — ABNORMAL HIGH (ref 0.61–1.24)
GFR, Estimated: 26 mL/min — ABNORMAL LOW (ref >60)
Glucose, Bld: 122 mg/dL — ABNORMAL HIGH (ref 70–99)
Potassium: 3.9 mmol/L (ref 3.5–5.1)
Sodium: 135 mmol/L (ref 135–145)

## 2023-06-18 LAB — GLUCOSE, CAPILLARY
Glucose-Capillary: 107 mg/dL — ABNORMAL HIGH (ref 70–99)
Glucose-Capillary: 118 mg/dL — ABNORMAL HIGH (ref 70–99)
Glucose-Capillary: 125 mg/dL — ABNORMAL HIGH (ref 70–99)
Glucose-Capillary: 127 mg/dL — ABNORMAL HIGH (ref 70–99)
Glucose-Capillary: 138 mg/dL — ABNORMAL HIGH (ref 70–99)
Glucose-Capillary: 142 mg/dL — ABNORMAL HIGH (ref 70–99)
Glucose-Capillary: 69 mg/dL — ABNORMAL LOW (ref 70–99)
Glucose-Capillary: 97 mg/dL (ref 70–99)

## 2023-06-18 MED ORDER — NEBIVOLOL HCL 2.5 MG PO TABS
2.5000 mg | ORAL_TABLET | Freq: Every day | ORAL | Status: DC
  Administered 2023-06-19: 2.5 mg via ORAL
  Filled 2023-06-18 (×2): qty 1

## 2023-06-18 NOTE — Progress Notes (Addendum)
Patient ID: Bradley [name], male   DOB: 1951-04-09, 72 y.o.   MRN: [id number] [facility] Progress Note  4 Days Post-Op  Subjective: No n/v.  Tolerating FLD with no issues.  Colostomy working well.  Patient seemed very surprised again today when told he had a colostomy bag.  He lives by himself.  He isn't even really aware right now where he is having BMs from.  He doesn't know how to care for this.  Objective: Vital signs in last 24 hours: Temp:  [97.7 F (36.5 C)-98.1 F (36.7 C)] 97.7 F (36.5 C) (10/29 0447) Pulse Rate:  [56-81] 57 (10/29 0916) Resp:  [17-18] 18 (10/29 0447) BP: (91-107)/(48-58) 91/48 (10/29 0916) SpO2:  [90 %-93 %] 90 % (10/29 0447) Last BM Date : 06/17/23  Intake/Output from previous day: 10/28 0701 - 10/29 0700 In: -  Out: 1515 [Urine:220; Stool:1295] Intake/Output this shift: Total I/O In: -  Out: 200 [Stool:200]  PE: Gen:  Alert, NAD, oriented, but seems a bit confused about his situation Abd- protuberant but soft, appropriately tender, Stoma is hyperemic, but viable.  Midline wound with wicks removed.  C/d/i  Lab Results:  Recent Labs    06/15/23 1010 06/16/23 0302  WBC 7.1 7.8  HGB 12.8* 13.7  HCT 38.8* 43.0  PLT 141* 140*   BMET Recent Labs    06/15/23 1010 06/16/23 0302  NA 136 140  K 4.9 4.8  CL 102 103  CO2 22 26  GLUCOSE 164* 102*  BUN 46* 51*  CREATININE 2.69* 2.05*  CALCIUM 7.9* 8.2*   PT/INR No results for input(s): "LABPROT", "INR" in the last 72 hours. CMP     Component Value Date/Time   NA 140 06/16/2023 0302   NA 139 06/17/2013 1114   K 4.8 06/16/2023 0302   K 4.2 06/17/2013 1114   CL 103 06/16/2023 0302   CO2 26 06/16/2023 0302   CO2 21 (L) 06/17/2013 1114   GLUCOSE 102 (H) 06/16/2023 0302   GLUCOSE 126 06/17/2013 1114   BUN 51 (H) 06/16/2023 0302   BUN 12.9 06/17/2013 1114   CREATININE 2.05 (H) 06/16/2023 0302   CREATININE 1.1 06/17/2013 1114   CALCIUM 8.2 (L) 06/16/2023 0302   CALCIUM  9.6 06/17/2013 1114   PROT 8.2 (H) 06/11/2023 1023   PROT 8.8 (H) 06/17/2013 1114   ALBUMIN 3.7 06/11/2023 1023   ALBUMIN 3.6 06/17/2013 1114   AST 12 (L) 06/11/2023 1023   AST 7 06/17/2013 1114   ALT 19 06/11/2023 1023   ALT 8 06/17/2013 1114   ALKPHOS 38 06/11/2023 1023   ALKPHOS 53 06/17/2013 1114   BILITOT 0.8 06/11/2023 1023   BILITOT 0.23 06/17/2013 1114   GFRNONAA 34 (L) 06/16/2023 0302   GFRAA >60 03/28/2020 1525   Lipase     Component Value Date/Time   LIPASE 24 06/11/2023 1023       Studies/Results: No results found.  Anti-infectives: Anti-infectives (From admission, onward)    Start     Dose/Rate Route Frequency Ordered Stop   06/15/23 0200  cefoTEtan (CEFOTAN) 2 g in sodium chloride 0.9 % 100 mL IVPB        2 g 200 mL/hr over 30 Minutes Intravenous Every 12 hours 06/14/23 2009 06/15/23 0323   06/14/23 2200  cefoTEtan (CEFOTAN) 2 g in sodium chloride 0.9 % 100 mL IVPB  Status:  Discontinued        2 g 200 mL/hr over 30 Minutes Intravenous Every 12 hours 06/14/23  2005 06/14/23 2009   06/14/23 0845  cefoTEtan (CEFOTAN) 2 g in sodium chloride 0.9 % 100 mL IVPB        2 g 200 mL/hr over 30 Minutes Intravenous On call to O.R. 06/14/23 0745 06/14/23 1506        Assessment/Plan POD 4, s/p lap converted to open Hartmann's for Sigmoid/descending colon stricture by Dr. Michaell Cowing 10/25 - doing well so far -tolerating FLD, adv to carb mod -needs to mobilize.  PT rec no follow up -glucerna  -continue routine colostomy care.  WOC seeing for teaching.  Discussed at length with primary service that patient lives alone and seems confused by the fact that he even has a colostomy on his abdomen despite nursing emptying and WOC seeing him yesterday and changing his pouch.  I am very concerned about him going home by himself right now as I don't think he will be able to manage this on his own yet.  May need rehab prior to home to continue to help him get used to this and learn  how to manage this. -BMET today to follow cr and electrolytes given somewhat high colostomy output   FEN - carb mod, glucerna VTE - lovenox ID - none    AKI - improving HTN HLD DM Bipolar H/o prostate cancer s/p radiotherapy (2021)     LOS: 7 days    Letha Cape, [facility] Surgery 06/18/2023, 9:18 AM Please see Amion for pager number during day hours 7:00am-4:30pm

## 2023-06-18 NOTE — TOC Initial Note (Signed)
Transition of Care [facility]) - Initial/Assessment Note    Patient Details  Name: Bradley [name] MRN: [id number] Date of Birth: 09/22/1950  [facility]) CM/SW Contact:    Otelia Santee, LCSW Phone Number: 06/18/2023, 2:58 PM  Clinical Narrative:                 Met with pt to discuss recommendation for SNF placement. Pt is agreeable to this plan however, seemed somewhat confused around what SNF is.  CSW spoke with pt's daughter via t/c to review plan. Pt's daughter shares pt has not been to SNF before but, she is agreeable to SNF placement.  PASRR has been requested for patient and is currently under review. Will need to upload FL-2 and 30-day note once co-signed by MD. MD notified of need for signature.  Referrals have been faxed out for SNF placement and currently awaiting bed offers.   Expected Discharge Plan: Skilled Nursing Facility Barriers to Discharge: Continued Medical Work up, SNF Pending bed offer   Patient Goals and CMS Choice Patient states their goals for this hospitalization and ongoing recovery are:: Unable to assess pt's goal for treatment          Expected Discharge Plan and Services In-house Referral: Clinical Social Work Discharge Planning Services: NA Post Acute Care Choice: Skilled Nursing Facility Living arrangements for the past 2 months: Apartment                 DME Arranged: N/A DME Agency: NA                  Prior Living Arrangements/Services Living arrangements for the past 2 months: Apartment Lives with:: Self Patient language and need for interpreter reviewed:: Yes Do you feel safe going back to the place where you live?: Yes      Need for Family Participation in Patient Care: Yes (Comment) Care giver support system in place?: No (comment) Current home services: DME Criminal Activity/Legal Involvement Pertinent to Current Situation/Hospitalization: No - Comment as needed  Activities of Daily Living   ADL  Screening (condition at time of admission) Independently performs ADLs?: Yes (appropriate for developmental age) Is the patient deaf or have difficulty hearing?: No Does the patient have difficulty seeing, even when wearing glasses/contacts?: No Does the patient have difficulty concentrating, remembering, or making decisions?: No  Permission Sought/Granted Permission sought to share information with : Facility Medical sales representative, Family Supports Permission granted to share information with : Yes, Verbal Permission Granted  Share Information with NAME: [name] Holts  Permission granted to share info w AGENCY: SNF's  Permission granted to share info w Relationship: Daughter  Permission granted to share info w Contact Information: [phone]  Emotional Assessment Appearance:: Appears stated age Attitude/Demeanor/Rapport: Engaged Affect (typically observed): Other (comment) (confused) Orientation: : Oriented to Self, Oriented to Place, Oriented to  Time, Oriented to Situation Alcohol / Substance Use: Not Applicable Psych Involvement: No (comment)  Admission diagnosis:  Elevated BUN [R79.9] Abnormal CT scan [R93.89] AKI (acute kidney injury) (HCC) [N17.9] Constipation, unspecified constipation type [K59.00] Patient Active Problem List   Diagnosis Date Noted   Colostomy in place [facility]) 06/18/2023   Severe obesity (BMI 35.0-35.9 with comorbidity) (HCC) 06/14/2023   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 06/13/2023   Abnormal CT scan 06/12/2023   Stricture of descending colon (HCC) 06/12/2023   Impacted cerumen of left ear 02/01/2023   Schizoaffective disorder, bipolar type (HCC) 01/08/2022   Malignant neoplasm of prostate (  HCC) 04/01/2020   Involuntary commitment    Vitamin D deficiency 02/09/2018   Lower GI bleed 07/26/2015   Acute lower GI bleeding 07/26/2015   Hematochezia 07/26/2015   Bipolar I disorder (HCC)    Diverticulitis of large intestine  with abscess without bleeding    Essential hypertension    Diverticulitis 10/13/2014   Erectile dysfunction 01/22/2014   Tobacco abuse 01/22/2014   Thrombocytopenia, unspecified (HCC) 06/17/2013   Testosterone deficiency 02/08/2012   Hyperlipemia    Bipolar 2 disorder (HCC)    Diabetes mellitus, type II (HCC) 08/23/2011   PCP:  Medicine, [facility]:   [facility] [phone] - [place], [place] - [address] AT [facility] [address] [place] [address] Phone: [phone] Fax: [phone]     Social Determinants of Health (SDOH) Social History: SDOH Screenings   Food Insecurity: No Food Insecurity (06/11/2023)  Housing: Low Risk  (06/11/2023)  Transportation Needs: No Transportation Needs (06/11/2023)  Utilities: Not At Risk (06/11/2023)  Financial Resource Strain: Not on File (12/07/2021)   Received from [place], [place]  Physical Activity: Not on File (12/07/2021)   Received from [place], [place]  Social Connections: Not on File (05/02/2023)   Received from [facility]  Stress: Not on File (12/07/2021)   Received from [place], [place]  Tobacco Use: High Risk (06/14/2023)   SDOH Interventions:     Readmission Risk Interventions     No data to display

## 2023-06-18 NOTE — Plan of Care (Signed)
  Problem: Education: Goal: Ability to describe self-care measures that may prevent or decrease complications (Diabetes Survival Skills Education) will improve Outcome: Progressing Goal: Individualized Educational Video(s) Outcome: Progressing   Problem: Clinical Measurements: Goal: Ability to maintain clinical measurements within normal limits will improve Outcome: Progressing Goal: Will remain free from infection Outcome: Progressing Goal: Diagnostic test results will improve Outcome: Progressing Goal: Respiratory complications will improve Outcome: Progressing Goal: Cardiovascular complication will be avoided Outcome: Progressing   Problem: Activity: Goal: Risk for activity intolerance will decrease Outcome: Progressing   Problem: Pain Management: Goal: General experience of comfort will improve Outcome: Progressing

## 2023-06-18 NOTE — [place] FL2 (Signed)
[place] MEDICAID FL2 LEVEL OF CARE FORM     IDENTIFICATION  Patient Name: Bradley [name] Birthdate: 11-Dec-1950 Sex: male Admission Date (Current Location): 06/11/2023  Csf - [place] and [place] Number:  Producer, television/film/video and Address:  [facility],  [address], [place] [address]      Provider Number: [id number]  Attending Physician Name and Address:  Marinda Elk, MD  Relative Name and Phone Number:  Daughter, Kain Moreaux [phone]    Current Level of Care: Hospital Recommended Level of Care: Skilled Nursing Facility Prior Approval Number:    Date Approved/Denied:   PASRR Number: Pending  Discharge Plan: SNF    Current Diagnoses: Patient Active Problem List   Diagnosis Date Noted   Colostomy in place [facility]) 06/18/2023   Severe obesity (BMI 35.0-35.9 with comorbidity) (HCC) 06/14/2023   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 06/13/2023   Abnormal CT scan 06/12/2023   Stricture of descending colon (HCC) 06/12/2023   Impacted cerumen of left ear 02/01/2023   Schizoaffective disorder, bipolar type (HCC) 01/08/2022   Malignant neoplasm of prostate (HCC) 04/01/2020   Involuntary commitment    Vitamin D deficiency 02/09/2018   Lower GI bleed 07/26/2015   Acute lower GI bleeding 07/26/2015   Hematochezia 07/26/2015   Bipolar I disorder (HCC)    Diverticulitis of large intestine with abscess without bleeding    Essential hypertension    Diverticulitis 10/13/2014   Erectile dysfunction 01/22/2014   Tobacco abuse 01/22/2014   Thrombocytopenia, unspecified (HCC) 06/17/2013   Testosterone deficiency 02/08/2012   Hyperlipemia    Bipolar 2 disorder (HCC)    Diabetes mellitus, type II (HCC) 08/23/2011    Orientation RESPIRATION BLADDER Height & Weight     Self, Time, Situation, Place  O2 Incontinent Weight: 240 lb 15.4 oz (109.3 kg) Height:  5\' 8"  (172.7 cm)  BEHAVIORAL SYMPTOMS/MOOD NEUROLOGICAL BOWEL  NUTRITION STATUS      Incontinent, Colostomy Diet (See discharge summary)  AMBULATORY STATUS COMMUNICATION OF NEEDS Skin   Extensive Assist Verbally Surgical wounds                       Personal Care Assistance Level of Assistance  Bathing, Feeding, Dressing Bathing Assistance: Limited assistance Feeding assistance: Independent Dressing Assistance: Limited assistance     Functional Limitations Info  Sight, Hearing, Speech Sight Info: Adequate Hearing Info: Adequate Speech Info: Adequate    SPECIAL CARE FACTORS FREQUENCY  PT (By licensed PT), OT (By licensed OT)     PT Frequency: 5x/wk OT Frequency: 5x/wk            Contractures Contractures Info: Not present    Additional Factors Info  Code Status, Allergies, Psychotropic Code Status Info: FULL Allergies Info: No Known Allergies Psychotropic Info: See MAR         Current Medications (06/18/2023):  This is the current hospital active medication list Current Facility-Administered Medications  Medication Dose Route Frequency Provider Last Rate Last Admin   acetaminophen (TYLENOL) tablet 1,000 mg  1,000 mg Oral Q6H Barnetta Chapel, PA-C   1,000 mg at 06/18/23 1226   bupivacaine liposome (EXPAREL) 1.3 % injection 266 mg  20 mL Infiltration Once Marinda Elk, MD       Chlorhexidine Gluconate Cloth 2 % PADS 6 each  6 each Topical Daily Marinda Elk, MD   6 each at 06/18/23 0914   dextrose (GLUTOSE) oral gel 40%  1 Tube Oral PRN Radonna Ricker  Rosine Beat, MD       divalproex (DEPAKOTE ER) 24 hr tablet 500 mg  500 mg Oral BID Marinda Elk, MD   500 mg at 06/18/23 0910   enoxaparin (LOVENOX) injection 40 mg  40 mg Subcutaneous Q24H Marinda Elk, MD   40 mg at 06/18/23 0910   feeding supplement (GLUCERNA SHAKE) (GLUCERNA SHAKE) liquid 237 mL  237 mL Oral BID BM Barnetta Chapel, PA-C   237 mL at 06/18/23 1439   fentaNYL (SUBLIMAZE) injection 12.5 mcg  12.5 mcg Intravenous Q30 min PRN Marinda Elk, MD       insulin aspart (novoLOG) injection 0-9 Units  0-9 Units Subcutaneous Q4H Marinda Elk, MD   1 Units at 06/18/23 1226   magic mouthwash  15 mL Oral QID PRN Marinda Elk, MD       menthol-cetylpyridinium (CEPACOL) lozenge 3 mg  1 lozenge Oral PRN Marinda Elk, MD       methocarbamol (ROBAXIN) tablet 500 mg  500 mg Oral Q8H PRN Barnetta Chapel, PA-C       naphazoline-glycerin (CLEAR EYES REDNESS) ophth solution 1-2 drop  1-2 drop Both Eyes QID PRN Marinda Elk, MD       [START ON 06/19/2023] nebivolol (BYSTOLIC) tablet 2.5 mg  2.5 mg Oral Daily Marinda Elk, MD       ondansetron [facility]) injection 4 mg  4 mg Intravenous Q6H PRN Marinda Elk, MD       Oral care mouth rinse  15 mL Mouth Rinse PRN Marinda Elk, MD       pantoprazole (PROTONIX) EC tablet 40 mg  40 mg Oral QHS Norva Pavlov, RPH   40 mg at 06/17/23 2236   phenol (CHLORASEPTIC) mouth spray 2 spray  2 spray Mouth/Throat PRN Marinda Elk, MD       pravastatin (PRAVACHOL) tablet 40 mg  40 mg Oral q1800 Marinda Elk, MD   40 mg at 06/17/23 1803   prochlorperazine (COMPAZINE) injection 5-10 mg  5-10 mg Intravenous Q4H PRN Marinda Elk, MD       prochlorperazine (COMPAZINE) tablet 5-10 mg  5-10 mg Oral Q6H PRN Marinda Elk, MD       risperiDONE (RISPERDAL) tablet 0.5 mg  0.5 mg Oral QHS Marinda Elk, MD   0.5 mg at 06/17/23 2236   simethicone (MYLICON) 40 MG/0.6ML suspension 80 mg  80 mg Oral QID PRN Marinda Elk, MD       sodium chloride (OCEAN) 0.65 % nasal spray 1-2 spray  1-2 spray Each Nare Q6H PRN Marinda Elk, MD       traMADol Janean Sark) tablet 50 mg  50 mg Oral Q6H PRN Barnetta Chapel, PA-C         Discharge Medications: Please see discharge summary for a list of discharge medications.  Relevant Imaging Results:  Relevant Lab Results:   Additional Information SSN: [id number]  Otelia Santee, LCSW

## 2023-06-18 NOTE — Progress Notes (Signed)
[facility] PROGRESS NOTE    Progress Note  Bradley [name]  FAO:[id number] DOB: 1950-09-10 DOA: 06/11/2023 PCP: Medicine, [facility]     Brief Narrative:   Bradley [name] is an 72 y.o. male past medical history significant for essential hypertension hyperlipidemia type 2 diabetes mellitus, bipolar disorder, chronic diverticular abscess comes in for constipation, presents to the [place] long ED with constipation incidentally found an acute kidney injury admitted by family medicine teaching service.  CT scan shows small diffuse large bowel dilation in the descending colon suspicious for colonic mass. 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  unfortunately he decompensated surgery was consulted, who performed laparoscopic surgery was done with left colectomy performed on 06/14/2023 transferred to PCCM postop remained intubated eventually extubated on 06/15/2023.   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 Unfortunately developed an ileus, continue further management per surgery. Currently with a colostomy. Hemoglobin is actually good and stable. Diet per surgery.  Having good ostomy output. Out of bed to chair, encourage incentive spirometry. Anticipate will need skilled nursing facility. The patient is not realizing after being informed several times that he has a colostomy that he has to empty. So it would be beneficial for him to go to skilled nursing facility for ostomy care and education.  I think there might be a language barrier.  Acute respiratory failure with hypoxia/postop ventilator management: S/p extubation on 06/15/2023 by PCCM. Now with lowering to  L mainly at night need to wean to room air. He probably has undiagnosed sleep  apnea  Postop pain: Transition to oral Tylenol which has been controlling his pain.  Acute kidney injury: With a baseline creatinine of 1.2-1.4 earlier this year.  Peaked at 3. Was started on IV fluids creatinine has improved. KVO iv fluids.  Essential hypertension: Continue to monitor closely once he is able to take orals and blood pressure starts to rise can restart some of his home medication. Resume low-dose beta-blocker on 06/19/2023 was started on oblate blocker due to tachycardia.  Hyperkalemia: Resolved with correction of acidosis.  Mild thrombocytopenia: Likely in response to stress event. Continue to monitor  Hyperlipemia Resume statins as an outpatient.  Schizoaffective disorder, bipolar type (HCC): Resume risperidone once able to take orals.  Malignant neoplasm of prostate (HCC) Noted.  Diabetes mellitus, type II (HCC) A1c of 6.5, he is on no oral hypoglycemic agents or insulin's at home. Right now his blood sugars controlled continue sliding scale insulin.  Severe obesity (BMI 35.0-35.9 with comorbidity) (HCC) Noted counseling.   DVT prophylaxis: none Family Communication:none Status is: Inpatient Remains inpatient appropriate because: Status post exploratory laparotomy status post resection    Code Status:     Code Status Orders  (From admission, onward)           Start     Ordered   06/11/23 2009  Full code  Continuous       Question:  By:  Answer:  Consent: discussion documented in EHR   06/11/23 2010           Code Status History     Date Active Date Inactive Code Status Order ID Comments User Context   07/26/2015 0338 07/29/2015 1559 Full Code [id number]  Briscoe Deutscher, MD Inpatient   10/15/2014 1040 10/17/2014 1846 Full Code [id number]  Oley Balm, MD  Inpatient   10/13/2014 2123 10/15/2014 1040 Full Code [id number]  Penny Pia, MD Inpatient         IV Access:   Peripheral IV   Procedures and diagnostic studies:    No results found.   Medical Consultants:   None.   Subjective:    Hearold Tough pain is controlled, Has no new complaints today. Objective:    Vitals:   06/17/23 1413 06/17/23 1926 06/18/23 0447 06/18/23 0916  BP: (!) 107/58 (!) 99/50 (!) 91/54 (!) 91/48  Pulse: 81 71 (!) 56 (!) 57  Resp: 18 17 18    Temp: 98.1 F (36.7 C) 97.9 F (36.6 C) 97.7 F (36.5 C)   TempSrc: Oral Oral Oral   SpO2: 93% 91% 90%   Weight:      Height:       SpO2: 90 % O2 Flow Rate (L/min): 3 L/min FiO2 (%): 36 %   Intake/Output Summary (Last 24 hours) at 06/18/2023 1002 Last data filed at 06/18/2023 0858 Gross per 24 hour  Intake --  Output 1645 ml  Net -1645 ml   Filed Weights   06/13/23 0900 06/14/23 1015 06/14/23 1931  Weight: 109.8 kg 108.8 kg 109.3 kg    Exam: General exam: In no acute distress. Respiratory system: Good air movement and clear to auscultation. Cardiovascular system: S1 & S2 heard, RRR. No JVD. Gastrointestinal system: Abdomen is nondistended, soft and nontender.  Midline scar ostomy in place Extremities: No pedal edema. Skin: No rashes, lesions or ulcers Psychiatry: Judgement and insight appear normal. Mood & affect appropriate.   Data Reviewed:    Labs: Basic Metabolic Panel: Recent Labs  Lab 06/13/23 0839 06/14/23 0858 06/15/23 0602 06/15/23 1010 06/16/23 0302  NA 138 138 138 136 140  K 4.7 5.1 5.2* 4.9 4.8  CL 105 104 103 102 103  CO2 23 22 23 22 26   GLUCOSE 89 95 163* 164* 102*  BUN 37* 35* 47* 46* 51*  CREATININE 1.35* 1.37* 2.41* 2.69* 2.05*  CALCIUM 8.9 8.9 8.1* 7.9* 8.2*  MG  --   --   --   --  2.3  PHOS  --   --   --   --  4.7*   GFR Estimated Creatinine Clearance: 40.8 mL/min (A) (by C-G formula based on SCr of 2.05 mg/dL (H)). Liver Function Tests: Recent Labs  Lab 06/11/23 1023  AST 12*  ALT 19  ALKPHOS 38  BILITOT 0.8  PROT 8.2*  ALBUMIN 3.7   Recent Labs  Lab 06/11/23 1023  LIPASE 24   No results for  input(s): "AMMONIA" in the last 168 hours. Coagulation profile No results for input(s): "INR", "PROTIME" in the last 168 hours. COVID-19 Labs  No results for input(s): "DDIMER", "FERRITIN", "LDH", "CRP" in the last 72 hours.  No results found for: "SARSCOV2NAA"  CBC: Recent Labs  Lab 06/13/23 0839 06/14/23 0858 06/15/23 0602 06/15/23 1010 06/16/23 0302  WBC 6.4 6.5 8.1 7.1 7.8  HGB 14.0 14.8 13.3 12.8* 13.7  HCT 43.9 45.9 42.1 38.8* 43.0  MCV 91.5 92.0 94.2 92.8 94.5  PLT 165 162 146* 141* 140*   Cardiac Enzymes: No results for input(s): "CKTOTAL", "CKMB", "CKMBINDEX", "TROPONINI" in the last 168 hours. BNP (last 3 results) No results for input(s): "PROBNP" in the last 8760 hours. CBG: Recent Labs  Lab 06/17/23 1959 06/18/23 0004 06/18/23 0400 06/18/23 0755 06/18/23 0852  GLUCAP 120* 107* 97 69* 118*   D-Dimer: No results for input(s): "DDIMER" in the  last 72 hours. Hgb A1c: No results for input(s): "HGBA1C" in the last 72 hours. Lipid Profile: No results for input(s): "CHOL", "HDL", "LDLCALC", "TRIG", "CHOLHDL", "LDLDIRECT" in the last 72 hours.  Thyroid function studies: No results for input(s): "TSH", "T4TOTAL", "T3FREE", "THYROIDAB" in the last 72 hours.  Invalid input(s): "FREET3" Anemia work up: No results for input(s): "VITAMINB12", "FOLATE", "FERRITIN", "TIBC", "IRON", "RETICCTPCT" in the last 72 hours. Sepsis Labs: Recent Labs  Lab 06/14/23 0858 06/15/23 0602 06/15/23 1010 06/16/23 0255 06/16/23 0302  PROCALCITON  --   --   --   --  8.51  WBC 6.5 8.1 7.1  --  7.8  LATICACIDVEN  --   --   --  1.7  --    Microbiology No results found for this or any previous visit (from the past 240 hour(s)).   Medications:    acetaminophen  1,000 mg Oral Q6H   bupivacaine liposome  20 mL Infiltration Once   Chlorhexidine Gluconate Cloth  6 each Topical Daily   divalproex  500 mg Oral BID   enoxaparin (LOVENOX) injection  40 mg Subcutaneous Q24H    feeding supplement (GLUCERNA SHAKE)  237 mL Oral BID BM   insulin aspart  0-9 Units Subcutaneous Q4H   nebivolol  10 mg Oral Daily   pantoprazole  40 mg Oral QHS   pravastatin  40 mg Oral q1800   risperiDONE  0.5 mg Oral QHS   Continuous Infusions:      LOS: 7 days   Marinda Elk  [facility]  06/18/2023, 10:02 AM

## 2023-06-18 NOTE — TOC PASRR Note (Signed)
30 Day PASRR Note   Patient Details  Name: Bradley [name] Date of Birth: 1950-10-09   Transition of Care [facility]) CM/SW Contact:    Otelia Santee, LCSW Phone Number: 06/18/2023, 3:09 PM  To Whom It May Concern:  Please be advised that this patient will require a short-term nursing home stay - anticipated 30 days or less for rehabilitation and strengthening.   The plan is for return home.

## 2023-06-18 NOTE — Progress Notes (Addendum)
Physical Therapy Treatment Patient Details Name: Bradley [name] MRN: [id number] DOB: 11/20/1950 Today's Date: 06/18/2023   History of Present Illness Pt admitted from home with complete colon and bowel obstruction and now s/p colectomy with end colectomy.  Pt decompensated post op and placed on vent with extubation 10/26.  Pt with hx of DM and bipolar    PT Comments  Patient  found  in bed, obtunded and required frequent cues to arouse to participate  in mobility. [place] in nares( PT did not note LPM). Patient Required max assist of 2 person to sit  up. Not maintaining eye contact  and closing eyes frequently. Patient did   stand at Rw and step  to recliner. Patient's pulse ox off of finger. PT then noted that SPO2 60%, BP 114/60, HR 57. PT noted O2 was on 0 LPM. Patient placed on 3 LPM with gradual return of SPO2  to 92 %. RN notified. SPO2  remained >90% on 3 LPM. Patient  demonstrated  poor effort to use IS. Patient with noted chest congestion and poor cough efforts.  No family present. Patient appears to require 24/7 caregivers for  safe mobility and   colostomy care. Family not present. .skileed.   If plan is discharge home, recommend the following: A lot of help with walking and/or transfers;A lot of help with bathing/dressing/bathroom;Direct supervision/assist for medications management;Assist for transportation;Assistance with cooking/housework;Help with stairs or ramp for entrance;Direct supervision/assist for financial management;Assistance with feeding   Can travel by private vehicle        Equipment Recommendations  None recommended by PT    Recommendations for Other Services       Precautions / Restrictions Precautions Precautions: Fall     Mobility  Bed Mobility Overal bed mobility: Needs Assistance Bed Mobility: Rolling, Sidelying to Sit Rolling: Max assist, +2 for physical assistance, +2 for safety/equipment Sidelying to sit: Max assist, +2 for physical assistance,  +2 for safety/equipment       General bed mobility comments: patient  obtunded and rwequired much stimulatio to move legs and trunk to sitting    Transfers Overall transfer level: Needs assistance Equipment used: Rolling walker (2 wheels) Transfers: Sit to/from Stand, Bed to chair/wheelchair/BSC Sit to Stand: Mod assist, +2 safety/equipment, +2 physical assistance           General transfer comment: multimodal cues to stay aroused and to perform stnading,  Able to power self up with mod assistance from bed ,  Able to step pivot with mod support as pt. not very aroused. then stand x 1 with min assist from recliner    Ambulation/Gait                   Stairs             Wheelchair Mobility     Tilt Bed    Modified Rankin (Stroke Patients Only)       Balance Overall balance assessment: Needs assistance Sitting-balance support: No upper extremity supported, Feet supported Sitting balance-Leahy Scale: Poor Sitting balance - Comments: multimodal cues to stay aroused. Tends to list when eyes closed   Standing balance support: Bilateral upper extremity supported, During functional activity, Reliant on assistive device for balance Standing balance-Leahy Scale: Poor Standing balance comment: staggering when standing                            Cognition Arousal: Obtunded Behavior During Therapy: Flat affect Overall  Cognitive Status: Impaired/Different from baseline Area of Impairment: Attention, Awareness                   Current Attention Level: Focused       Awareness: Intellectual   General Comments: patient is lethargic and arouses with frequent stimulation. oriented to self and place and October. Patioent not keeping eye contact, drifts closed until stimulated. Poor effotrt to use IS        Exercises      General Comments        Pertinent Vitals/Pain Pain Assessment Pain Assessment: Faces Facial Expression: Relaxed,  neutral Body Movements: Absence of movements Muscle Tension: Relaxed Compliance with ventilator (intubated pts.): N/A Vocalization (extubated pts.): Talking in normal tone or no sound CPOT Total: 0    Home Living                          Prior Function            PT Goals (current goals can now be found in the care plan section) Progress towards PT goals: Progressing toward goals    Frequency    Min 1X/week      PT Plan      Co-evaluation              AM-PAC PT "6 Clicks" Mobility   Outcome Measure  Help needed turning from your back to your side while in a flat bed without using bedrails?: A Lot Help needed moving from lying on your back to sitting on the side of a flat bed without using bedrails?: A Lot Help needed moving to and from a bed to a chair (including a wheelchair)?: Total Help needed standing up from a chair using your arms (e.g., wheelchair or bedside chair)?: Total Help needed to walk in hospital room?: Total Help needed climbing 3-5 steps with a railing? : Total 6 Click Score: 8    End of Session Equipment Utilized During Treatment: Gait belt;Oxygen (O2 was set on 0) Activity Tolerance: Patient limited by lethargy Patient left: in chair;with call bell/phone within reach;with chair alarm set;with nursing/sitter in room Nurse Communication: Mobility status (SPO2 60% on RA) PT Visit Diagnosis: Unsteadiness on feet (R26.81);Muscle weakness (generalized) (M62.81);Difficulty in walking, not elsewhere classified (R26.2);Pain     Time: 1115-1145 PT Time Calculation (min) (ACUTE ONLY): 30 min  Charges:    $Therapeutic Activity: 23-37 mins PT General Charges $$ ACUTE PT VISIT: 1 Visit                     Blanchard Kelch PT Acute Rehabilitation Services Office [phone] Weekend pager-[phone]    Rada Hay 06/18/2023, 12:33 PM

## 2023-06-19 DIAGNOSIS — K56600 Partial intestinal obstruction, unspecified as to cause: Secondary | ICD-10-CM | POA: Diagnosis not present

## 2023-06-19 DIAGNOSIS — K5900 Constipation, unspecified: Secondary | ICD-10-CM | POA: Diagnosis not present

## 2023-06-19 DIAGNOSIS — N179 Acute kidney failure, unspecified: Secondary | ICD-10-CM | POA: Diagnosis not present

## 2023-06-19 DIAGNOSIS — R9389 Abnormal findings on diagnostic imaging of other specified body structures: Secondary | ICD-10-CM | POA: Diagnosis not present

## 2023-06-19 LAB — BASIC METABOLIC PANEL
Anion gap: 12 (ref 5–15)
BUN: 71 mg/dL — ABNORMAL HIGH (ref 8–23)
CO2: 27 mmol/L (ref 22–32)
Calcium: 8.7 mg/dL — ABNORMAL LOW (ref 8.9–10.3)
Chloride: 98 mmol/L (ref 98–111)
Creatinine, Ser: 2.01 mg/dL — ABNORMAL HIGH (ref 0.61–1.24)
GFR, Estimated: 35 mL/min — ABNORMAL LOW (ref >60)
Glucose, Bld: 105 mg/dL — ABNORMAL HIGH (ref 70–99)
Potassium: 4.2 mmol/L (ref 3.5–5.1)
Sodium: 137 mmol/L (ref 135–145)

## 2023-06-19 LAB — GLUCOSE, CAPILLARY
Glucose-Capillary: 107 mg/dL — ABNORMAL HIGH (ref 70–99)
Glucose-Capillary: 145 mg/dL — ABNORMAL HIGH (ref 70–99)
Glucose-Capillary: 152 mg/dL — ABNORMAL HIGH (ref 70–99)
Glucose-Capillary: 153 mg/dL — ABNORMAL HIGH (ref 70–99)
Glucose-Capillary: 158 mg/dL — ABNORMAL HIGH (ref 70–99)
Glucose-Capillary: 88 mg/dL (ref 70–99)

## 2023-06-19 NOTE — Plan of Care (Signed)

## 2023-06-19 NOTE — Consult Note (Addendum)
WOC Nurse ostomy follow-up consult note Pt had colostomy surgery performed on 10/25.  Stoma type/location: Stoma is 90% red and viable, 2 inches and above skin level, 10% old dried blood surrounding edges.  Output: 100 cc liquid brown stool  Ostomy pouching: 2pc.  Education provided:  Attempted to discuss pouching routines and demonstrated pouch change procedure.  Pt was very groggy and did not watch the procedure or ask questions. He was unable to open and close the velcro to empty.  No family at the bedside. He will require total assistance with pouching activities after discharge.  Applied barrier ring and 2 piece pouching system. Use supplies: barrier ring, [place] # [phone], wafer [place] # 2, pouch Lawson # [id number] Educational materials left at the bedside, along with 4 sets of each supply.  Enrolled patient in [employer] DC program: Yes, previously  Thank-you,  Cammie Mcgee MSN, RN, CWOCN, [place], [place] [phone]      Revision History

## 2023-06-19 NOTE — Progress Notes (Signed)
[facility] PROGRESS NOTE    Progress Note  [name]  GUY:[id number] DOB: [birth year] DOA: 06/11/2023 PCP: Medicine, [facility]     Brief Narrative:   Bradley [name] is an 72 y.o. male past medical history significant for essential hypertension hyperlipidemia type 2 diabetes mellitus, bipolar disorder, chronic diverticular abscess comes in for constipation, presents to the [place] long ED with constipation incidentally found an acute kidney injury admitted by family medicine teaching service.  CT scan shows small diffuse large bowel dilation in the descending colon suspicious for colonic mass. 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  unfortunately he decompensated surgery was consulted, who performed laparoscopic surgery was done with left colectomy performed on 06/14/2023 transferred to [facility] postop remained intubated eventually extubated on 06/15/2023.  He then developed an ileus which is now resolved.  He is waiting insurance authorization for skilled nursing facility as he cannot manage his new colostomy and needs further education.  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 Currently with a colostomy. Hemoglobin is actually good and stable. Diet per surgery.  Having good ostomy output. Out of bed to chair, encourage incentive spirometry. Anticipate will need skilled nursing facility. The patient is not realizing after being informed several times that he has a colostomy that he has to empty. So it would be beneficial for him to go to skilled nursing facility for ostomy care and education.  I think there might be a language barrier.  Acute respiratory failure with hypoxia/postop ventilator management: S/p extubation on 06/15/2023 by PCCM. Still  requiring 3 L oxygen keep saturation greater 90%. He probably has undiagnosed sleep apnea  Postop pain: Transition to oral Tylenol which has been controlling his pain.  Acute kidney injury: With a baseline creatinine of 1.2-1.4 earlier this year.  Peaked at 3. Was started on IV fluids creatinine has improved. KVO iv fluids.  Ending is close to baseline.  Essential hypertension: Continue to monitor closely once he is able to take orals and blood pressure starts to rise can restart some of his home medication. Continue beta-blocker blood pressure is well-controlled.  Hyperkalemia: Resolved with correction of acidosis.  Mild thrombocytopenia: Likely in response to stress event. Continue to monitor  Hyperlipemia Resume statins as an outpatient.  Schizoaffective disorder, bipolar type (HCC): Resume risperidone once able to take orals.  Malignant neoplasm of prostate (HCC) Noted.  Diabetes mellitus, type II (HCC) A1c of 6.5, he is on no oral hypoglycemic agents or insulin's at home. Right now his blood sugars controlled continue sliding scale insulin.  Severe obesity (BMI 35.0-35.9 with comorbidity) (HCC) Noted counseling.   DVT prophylaxis: none Family Communication:none Status is: Inpatient Remains inpatient appropriate because: Status post exploratory laparotomy status post resection    Code Status:     Code Status Orders  (From admission, onward)           Start     Ordered   06/11/23 2009  Full code  Continuous       Question:  By:  Answer:  Consent: discussion documented in EHR   06/11/23 2010           Code Status History     Date Active Date Inactive Code Status Order ID Comments User Context   07/26/2015 0338 07/29/2015 1559 Full Code [id number]  Briscoe Deutscher, MD  Inpatient   10/15/2014 1040 10/17/2014 1846 Full Code [id number]  Oley Balm, MD Inpatient   10/13/2014 2123 10/15/2014 1040 Full Code [id number]  Penny Pia, MD Inpatient          IV Access:   Peripheral IV   Procedures and diagnostic studies:   No results found.   Medical Consultants:   None.   Subjective:    Bradley [name] no complaints today feels good. Objective:    Vitals:   06/18/23 0916 06/18/23 1132 06/18/23 2011 06/19/23 0445  BP: (!) 91/48 131/73 122/72 133/70  Pulse: (!) 57 (!) 57 (!) 55 (!) 57  Resp:   18 18  Temp:  (!) 97.5 F (36.4 C) 97.6 F (36.4 C) 97.8 F (36.6 C)  TempSrc:  Oral Oral Oral  SpO2:  92% 90% 90%  Weight:      Height:       SpO2: 90 % O2 Flow Rate (L/min): 3 L/min FiO2 (%): 36 %   Intake/Output Summary (Last 24 hours) at 06/19/2023 1002 Last data filed at 06/19/2023 0700 Gross per 24 hour  Intake --  Output 775 ml  Net -775 ml   Filed Weights   06/13/23 0900 06/14/23 1015 06/14/23 1931  Weight: 109.8 kg 108.8 kg 109.3 kg    Exam: General exam: In no acute distress. Respiratory system: Good air movement and clear to auscultation. Cardiovascular system: S1 & S2 heard, RRR. No JVD. Extremities: No pedal edema. Skin: No rashes, lesions or ulcers Psychiatry: Judgement and insight appear normal. Mood & affect appropriate. Gastrointestinal system: Abdomen is nondistended, soft and nontender.  Midline scar ostomy in   Data Reviewed:    Labs: Basic Metabolic Panel: Recent Labs  Lab 06/15/23 0602 06/15/23 1010 06/16/23 0302 06/18/23 0906 06/19/23 0545  NA 138 136 140 135 137  K 5.2* 4.9 4.8 3.9 4.2  CL 103 102 103 98 98  CO2 23 22 26 25 27   GLUCOSE 163* 164* 102* 122* 105*  BUN 47* 46* 51* 71* 71*  CREATININE 2.41* 2.69* 2.05* 2.58* 2.01*  CALCIUM 8.1* 7.9* 8.2* 8.6* 8.7*  MG  --   --  2.3  --   --   PHOS  --   --  4.7*  --   --    GFR Estimated Creatinine Clearance: 41.6 mL/min (A) (by C-G formula based on SCr of 2.01 mg/dL (H)). Liver Function Tests: No results for input(s): "AST", "ALT", "ALKPHOS", "BILITOT", "PROT", "ALBUMIN" in the last 168 hours.  No results for  input(s): "LIPASE", "AMYLASE" in the last 168 hours.  No results for input(s): "AMMONIA" in the last 168 hours. Coagulation profile No results for input(s): "INR", "PROTIME" in the last 168 hours. COVID-19 Labs  No results for input(s): "DDIMER", "FERRITIN", "LDH", "CRP" in the last 72 hours.  No results found for: "SARSCOV2NAA"  CBC: Recent Labs  Lab 06/13/23 0839 06/14/23 0858 06/15/23 0602 06/15/23 1010 06/16/23 0302  WBC 6.4 6.5 8.1 7.1 7.8  HGB 14.0 14.8 13.3 12.8* 13.7  HCT 43.9 45.9 42.1 38.8* 43.0  MCV 91.5 92.0 94.2 92.8 94.5  PLT 165 162 146* 141* 140*   Cardiac Enzymes: No results for input(s): "CKTOTAL", "CKMB", "CKMBINDEX", "TROPONINI" in the last 168 hours. BNP (last 3 results) No results for input(s): "PROBNP" in the last 8760 hours. CBG: Recent Labs  Lab 06/18/23 1647 06/18/23 2131 06/18/23 2340 06/19/23 0335 06/19/23 0808  GLUCAP 142* 138* 125* 88 107*   D-Dimer: No results for input(s): "DDIMER" in  the last 72 hours. Hgb A1c: No results for input(s): "HGBA1C" in the last 72 hours. Lipid Profile: No results for input(s): "CHOL", "HDL", "LDLCALC", "TRIG", "CHOLHDL", "LDLDIRECT" in the last 72 hours.  Thyroid function studies: No results for input(s): "TSH", "T4TOTAL", "T3FREE", "THYROIDAB" in the last 72 hours.  Invalid input(s): "FREET3" Anemia work up: No results for input(s): "VITAMINB12", "FOLATE", "FERRITIN", "TIBC", "IRON", "RETICCTPCT" in the last 72 hours. Sepsis Labs: Recent Labs  Lab 06/14/23 0858 06/15/23 0602 06/15/23 1010 06/16/23 0255 06/16/23 0302  PROCALCITON  --   --   --   --  8.51  WBC 6.5 8.1 7.1  --  7.8  LATICACIDVEN  --   --   --  1.7  --    Microbiology No results found for this or any previous visit (from the past 240 hour(s)).   Medications:    acetaminophen  1,000 mg Oral Q6H   bupivacaine liposome  20 mL Infiltration Once   Chlorhexidine Gluconate Cloth  6 each Topical Daily   divalproex  500 mg Oral  BID   enoxaparin (LOVENOX) injection  40 mg Subcutaneous Q24H   feeding supplement (GLUCERNA SHAKE)  237 mL Oral BID BM   insulin aspart  0-9 Units Subcutaneous Q4H   nebivolol  2.5 mg Oral Daily   pantoprazole  40 mg Oral QHS   pravastatin  40 mg Oral q1800   risperiDONE  0.5 mg Oral QHS   Continuous Infusions:      LOS: 8 days   Marinda Elk  [facility]  06/19/2023, 10:02 AM

## 2023-06-19 NOTE — Progress Notes (Signed)
Patient ID: Bradley [name], male   DOB: 08-18-1951, 72 y.o.   MRN: [id number] [facility] Surgery Progress Note  5 Days Post-Op  Subjective: No n/v.  Tolerating carb mod diet.  States he is not getting out of bed and mobilizing.  Still doesn't have a good comprehension of the fact that he has a colostomy or that he needs to participate and learn how to manage it, despite repeated discussions about this.  Objective: Vital signs in last 24 hours: Temp:  [97.5 F (36.4 C)-97.8 F (36.6 C)] 97.8 F (36.6 C) (10/30 0445) Pulse Rate:  [55-57] 57 (10/30 0445) Resp:  [18] 18 (10/30 0445) BP: (91-133)/(48-73) 133/70 (10/30 0445) SpO2:  [90 %-92 %] 90 % (10/30 0445) Last BM Date : 06/18/23  Intake/Output from previous day: 10/29 0701 - 10/30 0700 In: -  Out: 975 [Stool:975] Intake/Output this shift: No intake/output data recorded.  PE: Gen:  Alert, NAD, oriented Abd- protuberant but soft, appropriately tender, Stoma is hyperemic, but viable.  Midline wound with wicks removed.  C/d/i  Lab Results:  No results for input(s): "WBC", "HGB", "HCT", "PLT" in the last 72 hours.  BMET Recent Labs    06/18/23 0906 06/19/23 0545  NA 135 137  K 3.9 4.2  CL 98 98  CO2 25 27  GLUCOSE 122* 105*  BUN 71* 71*  CREATININE 2.58* 2.01*  CALCIUM 8.6* 8.7*   PT/INR No results for input(s): "LABPROT", "INR" in the last 72 hours. CMP     Component Value Date/Time   NA 137 06/19/2023 0545   NA 139 06/17/2013 1114   K 4.2 06/19/2023 0545   K 4.2 06/17/2013 1114   CL 98 06/19/2023 0545   CO2 27 06/19/2023 0545   CO2 21 (L) 06/17/2013 1114   GLUCOSE 105 (H) 06/19/2023 0545   GLUCOSE 126 06/17/2013 1114   BUN 71 (H) 06/19/2023 0545   BUN 12.9 06/17/2013 1114   CREATININE 2.01 (H) 06/19/2023 0545   CREATININE 1.1 06/17/2013 1114   CALCIUM 8.7 (L) 06/19/2023 0545   CALCIUM 9.6 06/17/2013 1114   PROT 8.2 (H) 06/11/2023 1023   PROT 8.8 (H) 06/17/2013 1114   ALBUMIN 3.7 06/11/2023  1023   ALBUMIN 3.6 06/17/2013 1114   AST 12 (L) 06/11/2023 1023   AST 7 06/17/2013 1114   ALT 19 06/11/2023 1023   ALT 8 06/17/2013 1114   ALKPHOS 38 06/11/2023 1023   ALKPHOS 53 06/17/2013 1114   BILITOT 0.8 06/11/2023 1023   BILITOT 0.23 06/17/2013 1114   GFRNONAA 35 (L) 06/19/2023 0545   GFRAA >60 03/28/2020 1525   Lipase     Component Value Date/Time   LIPASE 24 06/11/2023 1023       Studies/Results: No results found.  Anti-infectives: Anti-infectives (From admission, onward)    Start     Dose/Rate Route Frequency Ordered Stop   06/15/23 0200  cefoTEtan (CEFOTAN) 2 g in sodium chloride 0.9 % 100 mL IVPB        2 g 200 mL/hr over 30 Minutes Intravenous Every 12 hours 06/14/23 2009 06/15/23 0323   06/14/23 2200  cefoTEtan (CEFOTAN) 2 g in sodium chloride 0.9 % 100 mL IVPB  Status:  Discontinued        2 g 200 mL/hr over 30 Minutes Intravenous Every 12 hours 06/14/23 2005 06/14/23 2009   06/14/23 0845  cefoTEtan (CEFOTAN) 2 g in sodium chloride 0.9 % 100 mL IVPB        2 g 200  mL/hr over 30 Minutes Intravenous On call to O.R. 06/14/23 0745 06/14/23 1506        Assessment/Plan POD 5, s/p lap converted to open Hartmann's for Sigmoid/descending colon stricture by Dr. Michaell Cowing 10/25 - doing well overall so far -tolerating carb mod -needs to mobilize.  PT rec SNF -glucerna  -continue routine colostomy care.  WOC seeing for teaching.  Patient does't have a good comprehension of his colostomy.  Will ned Short-term SNF to help his get used to this and manage this prior to DC home given he lives by himself. -BMET looks good today. -output down to 700cc yesterday. -path: A. COLON, LEFT, RESECTION:       Segment of colon with diverticulosis and focal submucosal acute and  chronic inflammation.       Polypoid colonic mucosa (x6) with hyperplastic changes, serrated  features and reactive changes, see comment.       Eight benign lymph nodes, negative for metastatic carcinoma  (0/8).       Negative for dysplasia or malignancy.    FEN - carb mod, glucerna VTE - lovenox ID - none Dispo - SNF.  Surgically stable when bed available   AKI - improving HTN HLD DM Bipolar H/o prostate cancer s/p radiotherapy (2021)     LOS: 8 days    Letha Cape, [facility] Surgery 06/19/2023, 8:37 AM Please see Amion for pager number during day hours 7:00am-4:30pm

## 2023-06-19 NOTE — TOC Progression Note (Addendum)
Transition of Care [facility]) - Progression Note    Patient Details  Name: Bradley [name] MRN: [id number] Date of Birth: [birth year]  [facility]) CM/SW Contact  Otelia Santee, LCSW Phone Number: 06/19/2023, 10:26 AM  Clinical Narrative:    Uploaded requested medical records to [facility] for review for PASRR.   ADDENDUM: Pt's PASRR returned: [phone] E. Spoke with pt's daughter via t/c to review bed offers. Pt's daughter has accepted offer at [facility]. Insurance auth requested and approved from 10/30 to 11/1. Auth ID: [id number].   Expected Discharge Plan: Skilled Nursing Facility Barriers to Discharge: Continued Medical Work up, SNF Pending bed offer  Expected Discharge Plan and Services In-house Referral: Clinical Social Work Discharge Planning Services: NA Post Acute Care Choice: Skilled Nursing Facility Living arrangements for the past 2 months: Apartment                 DME Arranged: N/A DME Agency: NA                   Social Determinants of Health (SDOH) Interventions SDOH Screenings   Food Insecurity: No Food Insecurity (06/11/2023)  Housing: Low Risk  (06/11/2023)  Transportation Needs: No Transportation Needs (06/11/2023)  Utilities: Not At Risk (06/11/2023)  Financial Resource Strain: Not on File (12/07/2021)   Received from [place], [place]  Physical Activity: Not on File (12/07/2021)   Received from [place], [place]  Social Connections: Not on File (05/02/2023)   Received from [facility]  Stress: Not on File (12/07/2021)   Received from [place], [place]  Tobacco Use: High Risk (06/14/2023)    Readmission Risk Interventions     No data to display

## 2023-06-20 ENCOUNTER — Inpatient Hospital Stay (HOSPITAL_COMMUNITY): Payer: Medicare HMO

## 2023-06-20 DIAGNOSIS — K56600 Partial intestinal obstruction, unspecified as to cause: Secondary | ICD-10-CM | POA: Diagnosis not present

## 2023-06-20 LAB — GLUCOSE, CAPILLARY
Glucose-Capillary: 137 mg/dL — ABNORMAL HIGH (ref 70–99)
Glucose-Capillary: 119 mg/dL — ABNORMAL HIGH (ref 70–99)
Glucose-Capillary: 139 mg/dL — ABNORMAL HIGH (ref 70–99)
Glucose-Capillary: 127 mg/dL — ABNORMAL HIGH (ref 70–99)
Glucose-Capillary: 118 mg/dL — ABNORMAL HIGH (ref 70–99)

## 2023-06-20 LAB — CBC
HCT: 40.6 % (ref 39.0–52.0)
Hemoglobin: 12.7 g/dL — ABNORMAL LOW (ref 13.0–17.0)
MCH: 29.3 pg (ref 26.0–34.0)
MCHC: 31.3 g/dL (ref 30.0–36.0)
MCV: 93.8 fL (ref 80.0–100.0)
Platelets: 168 10*3/uL (ref 150–400)
RBC: 4.33 MIL/uL (ref 4.22–5.81)
RDW: 14.8 % (ref 11.5–15.5)
WBC: 9.3 10*3/uL (ref 4.0–10.5)
nRBC: 0 % (ref 0.0–0.2)

## 2023-06-20 LAB — TSH: TSH: 1.026 u[IU]/mL (ref 0.350–4.500)

## 2023-06-20 LAB — BASIC METABOLIC PANEL
Anion gap: 9 (ref 5–15)
BUN: 68 mg/dL — ABNORMAL HIGH (ref 8–23)
CO2: 28 mmol/L (ref 22–32)
Calcium: 8.6 mg/dL — ABNORMAL LOW (ref 8.9–10.3)
Chloride: 104 mmol/L (ref 98–111)
Creatinine, Ser: 1.25 mg/dL — ABNORMAL HIGH (ref 0.61–1.24)
GFR, Estimated: >60 mL/min (ref >60)
Glucose, Bld: 131 mg/dL — ABNORMAL HIGH (ref 70–99)
Potassium: 3.7 mmol/L (ref 3.5–5.1)
Sodium: 141 mmol/L (ref 135–145)

## 2023-06-20 LAB — PROCALCITONIN: Procalcitonin: 1.78 ng/mL

## 2023-06-20 LAB — T4, FREE: Free T4: 1.01 ng/dL (ref 0.61–1.12)

## 2023-06-20 LAB — VALPROIC ACID LEVEL: Valproic Acid Lvl: 34 ug/mL — ABNORMAL LOW (ref 50.0–100.0)

## 2023-06-20 LAB — C-REACTIVE PROTEIN: CRP: 22.1 mg/dL — ABNORMAL HIGH (ref <1.0)

## 2023-06-20 LAB — AMMONIA: Ammonia: 66 umol/L — ABNORMAL HIGH (ref 9–35)

## 2023-06-20 MED ORDER — INSULIN ASPART 100 UNIT/ML IJ SOLN: 0.0000 [IU] | Freq: Every day | INTRAMUSCULAR | Status: DC

## 2023-06-20 MED ORDER — LACTULOSE 10 GM/15ML PO SOLN
10.0000 g | Freq: Every day | ORAL | Status: DC
  Administered 2023-06-21 – 2023-06-25 (×5): 10 g via ORAL
  Filled 2023-06-20 (×5): qty 15

## 2023-06-20 MED ORDER — INSULIN ASPART 100 UNIT/ML IJ SOLN
0.0000 [IU] | Freq: Three times a day (TID) | INTRAMUSCULAR | Status: DC
  Administered 2023-06-20 – 2023-06-21 (×3): 1 [IU] via SUBCUTANEOUS
  Administered 2023-06-22: 2 [IU] via SUBCUTANEOUS
  Administered 2023-06-22 – 2023-06-24 (×5): 1 [IU] via SUBCUTANEOUS

## 2023-06-20 MED ORDER — RISPERIDONE 0.25 MG PO TABS
0.2500 mg | ORAL_TABLET | Freq: Every day | ORAL | Status: DC
  Administered 2023-06-20 – 2023-06-24 (×5): 0.25 mg via ORAL
  Filled 2023-06-20 (×5): qty 1

## 2023-06-20 MED ORDER — IPRATROPIUM-ALBUTEROL 0.5-2.5 (3) MG/3ML IN SOLN
3.0000 mL | Freq: Four times a day (QID) | RESPIRATORY_TRACT | Status: DC
  Administered 2023-06-20: 3 mL via RESPIRATORY_TRACT
  Filled 2023-06-20: qty 3

## 2023-06-20 MED ORDER — IPRATROPIUM-ALBUTEROL 0.5-2.5 (3) MG/3ML IN SOLN
3.0000 mL | Freq: Three times a day (TID) | RESPIRATORY_TRACT | Status: DC
  Administered 2023-06-20 – 2023-06-21 (×4): 3 mL via RESPIRATORY_TRACT
  Filled 2023-06-20 (×4): qty 3

## 2023-06-20 MED ORDER — ONDANSETRON HCL 4 MG/2ML IJ SOLN: 4.0000 mg | Freq: Four times a day (QID) | INTRAMUSCULAR | Status: DC | PRN

## 2023-06-20 MED ORDER — FUROSEMIDE 10 MG/ML IJ SOLN
20.0000 mg | Freq: Once | INTRAMUSCULAR | Status: AC
  Administered 2023-06-21: 20 mg via INTRAVENOUS
  Filled 2023-06-20: qty 2

## 2023-06-20 NOTE — Progress Notes (Signed)
Patient ID: Bradley [name], male   DOB: 02/19/1951, 72 y.o.   MRN: [id number] [facility] Surgery Progress Note  6 Days Post-Op  Subjective: He is alert but not oriented at time of my exam - he is unable to tell me his name or date of birth and mumbles. He does say no when asked if he is having any abdominal pain. PT present during my exam  Objective: Vital signs in last 24 hours: Temp:  [97.9 F (36.6 C)-98.2 F (36.8 C)] 97.9 F (36.6 C) (10/31 0519) Pulse Rate:  [57-59] 59 (10/31 0519) Resp:  [18] 18 (10/31 0519) BP: (119-142)/(72) 119/72 (10/31 0519) SpO2:  [91 %-92 %] 91 % (10/31 0519) Last BM Date : 06/19/23  Intake/Output from previous day: 10/30 0701 - 10/31 0700 In: -  Out: 500 [Stool:500] Intake/Output this shift: No intake/output data recorded.  PE: Gen:  Alert, NAD, disoriented Abd- protuberant but soft, appropriately tender, Stoma not visualized due to large amount soft stool.  Midline wound with wicks removed.  C/d/i  Lab Results:  No results for input(s): "WBC", "HGB", "HCT", "PLT" in the last 72 hours.  BMET Recent Labs    06/18/23 0906 06/19/23 0545  NA 135 137  K 3.9 4.2  CL 98 98  CO2 25 27  GLUCOSE 122* 105*  BUN 71* 71*  CREATININE 2.58* 2.01*  CALCIUM 8.6* 8.7*   PT/INR No results for input(s): "LABPROT", "INR" in the last 72 hours. CMP     Component Value Date/Time   NA 137 06/19/2023 0545   NA 139 06/17/2013 1114   K 4.2 06/19/2023 0545   K 4.2 06/17/2013 1114   CL 98 06/19/2023 0545   CO2 27 06/19/2023 0545   CO2 21 (L) 06/17/2013 1114   GLUCOSE 105 (H) 06/19/2023 0545   GLUCOSE 126 06/17/2013 1114   BUN 71 (H) 06/19/2023 0545   BUN 12.9 06/17/2013 1114   CREATININE 2.01 (H) 06/19/2023 0545   CREATININE 1.1 06/17/2013 1114   CALCIUM 8.7 (L) 06/19/2023 0545   CALCIUM 9.6 06/17/2013 1114   PROT 8.2 (H) 06/11/2023 1023   PROT 8.8 (H) 06/17/2013 1114   ALBUMIN 3.7 06/11/2023 1023   ALBUMIN 3.6 06/17/2013 1114   AST 12  (L) 06/11/2023 1023   AST 7 06/17/2013 1114   ALT 19 06/11/2023 1023   ALT 8 06/17/2013 1114   ALKPHOS 38 06/11/2023 1023   ALKPHOS 53 06/17/2013 1114   BILITOT 0.8 06/11/2023 1023   BILITOT 0.23 06/17/2013 1114   GFRNONAA 35 (L) 06/19/2023 0545   GFRAA >60 03/28/2020 1525   Lipase     Component Value Date/Time   LIPASE 24 06/11/2023 1023       Studies/Results: No results found.  Anti-infectives: Anti-infectives (From admission, onward)    Start     Dose/Rate Route Frequency Ordered Stop   06/15/23 0200  cefoTEtan (CEFOTAN) 2 g in sodium chloride 0.9 % 100 mL IVPB        2 g 200 mL/hr over 30 Minutes Intravenous Every 12 hours 06/14/23 2009 06/15/23 0323   06/14/23 2200  cefoTEtan (CEFOTAN) 2 g in sodium chloride 0.9 % 100 mL IVPB  Status:  Discontinued        2 g 200 mL/hr over 30 Minutes Intravenous Every 12 hours 06/14/23 2005 06/14/23 2009   06/14/23 0845  cefoTEtan (CEFOTAN) 2 g in sodium chloride 0.9 % 100 mL IVPB        2 g 200 mL/hr over  30 Minutes Intravenous On call to O.R. 06/14/23 0745 06/14/23 1506        Assessment/Plan POD 6, s/p lap converted to open Hartmann's for Sigmoid/descending colon stricture by Dr. Michaell Cowing 10/25 -tolerating carb mod -needs to mobilize.  PT rec SNF -glucerna  -continue routine colostomy care.  WOC seeing for teaching.  Patient does't have a good comprehension of his colostomy.  Will ned Short-term SNF to help his get used to this and manage this prior to DC home given he lives by himself. My first time evaluating patient but he may have had decline in mental status. Discussed with primary. He has been afebrile with VSS. Abdominal exam benign. Will check cbc -BMET today with improved renal function -output down to 500cc yesterday with 2 unmeasured occurrences -path: A. COLON, LEFT, RESECTION:       Segment of colon with diverticulosis and focal submucosal acute and  chronic inflammation.       Polypoid colonic mucosa (x6) with  hyperplastic changes, serrated  features and reactive changes, see comment.       Eight benign lymph nodes, negative for metastatic carcinoma (0/8).       Negative for dysplasia or malignancy.    FEN - carb mod, glucerna VTE - lovenox ID - none Dispo - SNF.  Surgically stable when bed available   AKI - improving HTN HLD DM Bipolar H/o prostate cancer s/p radiotherapy (2021)     LOS: 9 days    Eric Form, [facility] Surgery 06/20/2023, 9:45 AM Please see Amion for pager number during day hours 7:00am-4:30pm

## 2023-06-20 NOTE — Plan of Care (Signed)
  Problem: Education: Goal: Ability to describe self-care measures that may prevent or decrease complications (Diabetes Survival Skills Education) will improve Outcome: Progressing Goal: Individualized Educational Video(s) Outcome: Progressing   Problem: Coping: Goal: Ability to adjust to condition or change in health will improve Outcome: Progressing   

## 2023-06-20 NOTE — Progress Notes (Signed)
Physical Therapy Treatment Patient Details Name: Bradley [name] MRN: [id number] DOB: September 07, 1950 Today's Date: 06/20/2023   History of Present Illness Pt admitted from home with complete colon and bowel obstruction and now s/p colectomy with end colostomy.  Pt decompensated post op and placed on vent with extubation 10/26.  Pt with hx of DM and bipolar    PT Comments  The patient continues to be lethargic, arouses with stimulation of voice but drifts back  to lethargy. Patient requiring increased assistance of 2 persons, unable to step safely today with RW, constant cues to  step along bed edge.  PATIENT HAS HAD DECLINE  IN MS AND DEMONSTRATES POOR AROUSAL TO PARTICIPATE SINCE PT EVAL ON 10/ 27 WHERE HE AMBULATED X 32 ' WITH A RW AND WAS ABLE TO CONVERSE WITH PT.  SPO2 on 3 LPM  92%.  Patient did feed self a few bites with a spoon  while seated on bed edge but PT had to constantly provide max support of trunk to prevent loss of balance.  Per nursing , pt more lethargic than yesterday.   If plan is discharge home, recommend the following: A lot of help with walking and/or transfers;A lot of help with bathing/dressing/bathroom;Direct supervision/assist for medications management;Assist for transportation;Assistance with cooking/housework;Help with stairs or ramp for entrance;Direct supervision/assist for financial management;Assistance with feeding   Can travel by private vehicle        Equipment Recommendations  None recommended by PT    Recommendations for Other Services       Precautions / Restrictions Precautions Precautions: Fall Precaution Comments: colostomy, usually full of air Restrictions Weight Bearing Restrictions: No     Mobility  Bed Mobility Overal bed mobility: Needs Assistance Bed Mobility: Supine to Sit, Sit to Supine Rolling: Max assist   Supine to sit: Max assist, HOB elevated, Used rails Sit to supine: Max assist, +2 for physical assistance, +2 for  safety/equipment   General bed mobility comments: patient did initiate to place llegs to bed edge, reach for rail to , required constant cues and assistance to  move legs to bed edge and sit upright, +2 to return to  supne, assist legs up,    Transfers Overall transfer level: Needs assistance Equipment used: Rolling walker (2 wheels) Transfers: Sit to/from Stand Sit to Stand: Max assist, +2 safety/equipment, +2 physical assistance           General transfer comment: multimodal cues to stay aroused and to perform standing,  Able to power self up with mod assistance from bed ,  very  much difficulty to side step, constant cues to step,    Ambulation/Gait                   Stairs             Wheelchair Mobility     Tilt Bed    Modified Rankin (Stroke Patients Only)       Balance Overall balance assessment: Needs assistance Sitting-balance support: Feet supported Sitting balance-Leahy Scale: Poor Sitting balance - Comments: required support constantly to prevent lweaning back, more so when feeding self while seated on bed edge. Postural control: Posterior lean Standing balance support: Bilateral upper extremity supported, During functional activity, Reliant on assistive device for balance Standing balance-Leahy Scale: Poor Standing balance comment: posterior leaning                            Cognition Arousal: Obtunded  Behavior During Therapy: Flat affect Overall Cognitive Status: Impaired/Different from baseline Area of Impairment: Attention, Awareness, Memory, Following commands, Safety/judgement, Problem solving, Orientation                 Orientation Level: Place, Situation, Time Current Attention Level: Focused   Following Commands: Follows one step commands inconsistently, Follows one step commands with increased time Safety/Judgement: Decreased awareness of deficits Awareness: Intellectual Problem Solving: Slow processing,  Decreased initiation, Difficulty sequencing, Requires verbal cues, Requires tactile cues General Comments: patient is lethargic/obtunded, requires constant stimulation to stay awake, once in sitting did remain awake to feed self several bites of oatmeal and 1 pancake        Exercises      General Comments        Pertinent Vitals/Pain Pain Assessment Breathing: normal Negative Vocalization: occasional moan/groan, low speech, negative/disapproving quality Facial Expression: smiling or inexpressive Body Language: tense, distressed pacing, fidgeting Consolability: no need to console PAINAD Score: 2 Pain Location: when moved to sitting Pain Descriptors / Indicators: Grimacing, Moaning    Home Living                          Prior Function            PT Goals (current goals can now be found in the care plan section) Progress towards PT goals: Not progressing toward goals - comment (remains lethargic)    Frequency    Min 1X/week      PT Plan      Co-evaluation              AM-PAC PT "6 Clicks" Mobility   Outcome Measure  Help needed turning from your back to your side while in a flat bed without using bedrails?: Total Help needed moving from lying on your back to sitting on the side of a flat bed without using bedrails?: Total Help needed moving to and from a bed to a chair (including a wheelchair)?: Total Help needed standing up from a chair using your arms (e.g., wheelchair or bedside chair)?: Total Help needed to walk in hospital room?: Total Help needed climbing 3-5 steps with a railing? : Total 6 Click Score: 6    End of Session Equipment Utilized During Treatment: Gait belt;Oxygen Activity Tolerance: Patient limited by lethargy Patient left: in bed;with call bell/phone within reach;with bed alarm set;with restraints reapplied Nurse Communication: Mobility status PT Visit Diagnosis: Unsteadiness on feet (R26.81);Muscle weakness (generalized)  (M62.81);Difficulty in walking, not elsewhere classified (R26.2);Pain     Time: 1610-9604 PT Time Calculation (min) (ACUTE ONLY): 49 min  Charges:    $Therapeutic Activity: 23-37 mins $Self Care/Home Management: 8-22 PT General Charges $$ ACUTE PT VISIT: 1 Visit                     Blanchard Kelch PT Acute Rehabilitation Services Office [phone] Weekend pager-[phone]    Rada Hay 06/20/2023, 10:57 AM

## 2023-06-20 NOTE — Plan of Care (Signed)
  Problem: Education: Goal: Ability to describe self-care measures that may prevent or decrease complications (Diabetes Survival Skills Education) will improve Outcome: Not Progressing   Problem: Coping: Goal: Ability to adjust to condition or change in health will improve Outcome: Not Progressing   Problem: Fluid Volume: Goal: Ability to maintain a balanced intake and output will improve Outcome: Progressing   Problem: Health Behavior/Discharge Planning: Goal: Ability to manage health-related needs will improve Outcome: Progressing   Problem: Education: Goal: Knowledge of General Education information will improve Description: Including pain rating scale, medication(s)/side effects and non-pharmacologic comfort measures Outcome: Not Progressing

## 2023-06-20 NOTE — Hospital Course (Addendum)
Brief hospital course: Bradley [name] is an 72 y.o. male past medical history significant for essential hypertension hyperlipidemia type 2 diabetes mellitus, bipolar disorder, chronic diverticular abscess comes in for constipation, presents to the [facility] ED with constipation incidentally found an acute kidney injury admitted by family medicine teaching service.  CT scan shows small diffuse large bowel dilation in the descending colon suspicious for colonic mass. 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  unfortunately he decompensated surgery was consulted, who performed laparoscopic surgery was done with left colectomy performed on 06/14/2023 transferred to [facility] postop remained intubated eventually extubated on 06/15/2023.  He then developed an ileus which is now resolved.   Patient appears to have ongoing encephalopathy likely in the setting of uremia. 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 Currently with a colostomy. Having good ostomy output. Out of bed to chair, encourage incentive spirometry. The patient is not realizing after being informed several times that he has a colostomy that he has to empty. Currently arranged to go to SNF.   Acute respiratory failure with hypoxia/postop ventilator management: S/p extubation on 06/15/2023 by PCCM. Still requiring 3 L oxygen keep saturation greater 90%. Appears to have bilateral expiratory wheezing. Chest x-ray performed.  Suspect volume overload.  Will provide IV Lasix and monitor. Also initiating nebulizer therapy.   Postop pain: Transition to oral Tylenol which has been controlling his pain.   Acute kidney injury: With a baseline creatinine of 1.2-1.4 earlier this year.  Peaked at 3. Was started on IV  fluids creatinine has improved. Now concerned about volume overload.  Acute metabolic encephalopathy. Likely in the setting of uremia. BUN significantly elevated in the setting of AKI. For now BUN trending down. Will monitor.  Metabolic workup unremarkable.  Monitor.   Essential hypertension: Given his bradycardia and hypotension I will be holding oral antihypertensive regimen.   Hyperkalemia: Resolved with correction of acidosis.   Mild thrombocytopenia: Likely in response to stress event. Continue to monitor   Hyperlipemia Resume statins as an outpatient.   Schizoaffective disorder, bipolar type (HCC): Resume risperidone.   Malignant neoplasm of prostate (HCC) Noted.   Diabetes mellitus, type II (HCC) A1c of 6.5, he is on no oral hypoglycemic agents or insulin's at home. Right now his blood sugars controlled continue sliding scale insulin.  Obesity Class 1 Body mass index is 36.64 kg/m.  Placing the pt at higher risk of poor outcomes.

## 2023-06-20 NOTE — Progress Notes (Addendum)
[facility] Progress Note Patient: Bradley [name] ZOX:[id number] DOB: 10/27/1950 DOA: 06/11/2023  DOS: the patient was seen and examined on 06/20/2023  Brief hospital course: Bradley [name] is an 72 y.o. male past medical history significant for essential hypertension hyperlipidemia type 2 diabetes mellitus, bipolar disorder, chronic diverticular abscess comes in for constipation, presents to the [facility] ED with constipation incidentally found an acute kidney injury admitted by family medicine teaching service.  CT scan shows small diffuse large bowel dilation in the descending colon suspicious for colonic mass. 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  unfortunately he decompensated surgery was consulted, who performed laparoscopic surgery was done with left colectomy performed on 06/14/2023 transferred to PCCM postop remained intubated eventually extubated on 06/15/2023.  He then developed an ileus which is now resolved.   Patient appears to have ongoing encephalopathy likely in the setting of uremia. 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 Currently with a colostomy. Having good ostomy output. Out of bed to chair, encourage incentive spirometry. The patient is not realizing after being informed several times that he has a colostomy that he has to empty. Currently arranged to go to SNF.   Acute respiratory failure with hypoxia/postop ventilator management: S/p extubation on 06/15/2023 by PCCM. Still requiring 3 L oxygen keep saturation greater 90%. Appears to have bilateral expiratory wheezing. Chest x-ray performed.  Suspect volume overload.  Will provide IV Lasix and monitor. Also initiating nebulizer therapy.   Postop pain: Transition to  oral Tylenol which has been controlling his pain.   Acute kidney injury: With a baseline creatinine of 1.2-1.4 earlier this year.  Peaked at 3. Was started on IV fluids creatinine has improved. Now concerned about volume overload.  Acute metabolic encephalopathy. Likely in the setting of uremia. BUN significantly elevated in the setting of AKI. For now BUN trending down. Will monitor.   Essential hypertension: Given his bradycardia and hypotension I will be holding oral antihypertensive regimen.   Hyperkalemia: Resolved with correction of acidosis.   Mild thrombocytopenia: Likely in response to stress event. Continue to monitor   Hyperlipemia Resume statins as an outpatient.   Schizoaffective disorder, bipolar type (HCC): Resume risperidone.   Malignant neoplasm of prostate (HCC) Noted.   Diabetes mellitus, type II (HCC) A1c of 6.5, he is on no oral hypoglycemic agents or insulin's at home. Right now his blood sugars controlled continue sliding scale insulin.  Obesity Class 1 Body mass index is 36.64 kg/m.  Placing the pt at higher risk of poor outcomes.   Subjective: No nausea no vomiting no fever no chills.  Asking me what was done in the hospital.  Not aware that he has a colostomy bag.  Physical Exam: General: in Mild distress, No Rash Cardiovascular: S1 and S2 Present, No Murmur Respiratory: Good respiratory effort, Bilateral Air entry present. No Crackles, No wheezes Abdomen: Bowel Sound present, No tenderness Extremities: No edema Neuro: Alert and oriented x3, no new focal deficit  Data Reviewed: I have Reviewed nursing notes, Vitals, and Lab results. Since last encounter, pertinent lab results CBC and BMP   . I have ordered test including CBC and BMP  . I have discussed pt's care plan and test results with general surgery  .   Disposition: Status is: Inpatient Remains inpatient appropriate because: Awaiting improvement in  mentation and further  workup  enoxaparin (LOVENOX) injection 40 mg Start: 06/16/23 1000   Family Communication: No one at bedside Level of care: Med-Surg   Vitals:   06/19/23 2004 06/20/23 0519 06/20/23 1225 06/20/23 1236  BP: (!) 142/72 119/72  133/64  Pulse: (!) 57 (!) 59  64  Resp: 18 18  20   Temp: 98.2 F (36.8 C) 97.9 F (36.6 C)  98.2 F (36.8 C)  TempSrc: Oral Oral  Oral  SpO2: 92% 91% 92% 95%  Weight:      Height:         Author: Lynden Oxford, MD 06/20/2023 6:47 PM  Please look on [email or web address] to find out who is on call.

## 2023-06-21 DIAGNOSIS — K56600 Partial intestinal obstruction, unspecified as to cause: Secondary | ICD-10-CM | POA: Diagnosis not present

## 2023-06-21 LAB — CBC WITH DIFFERENTIAL/PLATELET
Abs Immature Granulocytes: 0.34 10*3/uL — ABNORMAL HIGH (ref 0.00–0.07)
Basophils Absolute: 0.1 10*3/uL (ref 0.0–0.1)
Basophils Relative: 1 %
Eosinophils Absolute: 0.1 10*3/uL (ref 0.0–0.5)
Eosinophils Relative: 1 %
HCT: 40.4 % (ref 39.0–52.0)
Hemoglobin: 12.9 g/dL — ABNORMAL LOW (ref 13.0–17.0)
Immature Granulocytes: 4 %
Lymphocytes Relative: 11 %
Lymphs Abs: 1.1 10*3/uL (ref 0.7–4.0)
MCH: 30.1 pg (ref 26.0–34.0)
MCHC: 31.9 g/dL (ref 30.0–36.0)
MCV: 94.2 fL (ref 80.0–100.0)
Monocytes Absolute: 2.3 10*3/uL — ABNORMAL HIGH (ref 0.1–1.0)
Monocytes Relative: 24 %
Neutro Abs: 5.8 10*3/uL (ref 1.7–7.7)
Neutrophils Relative %: 59 %
Platelets: 173 10*3/uL (ref 150–400)
RBC: 4.29 MIL/uL (ref 4.22–5.81)
RDW: 15 % (ref 11.5–15.5)
WBC: 9.7 10*3/uL (ref 4.0–10.5)
nRBC: 0 % (ref 0.0–0.2)

## 2023-06-21 LAB — COMPREHENSIVE METABOLIC PANEL
ALT: 21 U/L (ref 0–44)
AST: 12 U/L — ABNORMAL LOW (ref 15–41)
Albumin: 2.8 g/dL — ABNORMAL LOW (ref 3.5–5.0)
Alkaline Phosphatase: 53 U/L (ref 38–126)
Anion gap: 11 (ref 5–15)
BUN: 58 mg/dL — ABNORMAL HIGH (ref 8–23)
CO2: 27 mmol/L (ref 22–32)
Calcium: 8.8 mg/dL — ABNORMAL LOW (ref 8.9–10.3)
Chloride: 104 mmol/L (ref 98–111)
Creatinine, Ser: 1.25 mg/dL — ABNORMAL HIGH (ref 0.61–1.24)
GFR, Estimated: >60 mL/min (ref >60)
Glucose, Bld: 150 mg/dL — ABNORMAL HIGH (ref 70–99)
Potassium: 3.9 mmol/L (ref 3.5–5.1)
Sodium: 142 mmol/L (ref 135–145)
Total Bilirubin: 0.8 mg/dL (ref 0.3–1.2)
Total Protein: 7.6 g/dL (ref 6.5–8.1)

## 2023-06-21 LAB — PROTIME-INR
INR: 1.8 — ABNORMAL HIGH (ref 0.8–1.2)
Prothrombin Time: 20.9 s — ABNORMAL HIGH (ref 11.4–15.2)

## 2023-06-21 LAB — GLUCOSE, CAPILLARY
Glucose-Capillary: 112 mg/dL — ABNORMAL HIGH (ref 70–99)
Glucose-Capillary: 123 mg/dL — ABNORMAL HIGH (ref 70–99)
Glucose-Capillary: 132 mg/dL — ABNORMAL HIGH (ref 70–99)
Glucose-Capillary: 147 mg/dL — ABNORMAL HIGH (ref 70–99)
Glucose-Capillary: 94 mg/dL (ref 70–99)

## 2023-06-21 LAB — MAGNESIUM: Magnesium: 2.6 mg/dL — ABNORMAL HIGH (ref 1.7–2.4)

## 2023-06-21 MED ORDER — IPRATROPIUM-ALBUTEROL 0.5-2.5 (3) MG/3ML IN SOLN: 3.0000 mL | RESPIRATORY_TRACT | Status: DC | PRN

## 2023-06-21 MED ORDER — SIMETHICONE 40 MG/0.6ML PO SUSP
80.0000 mg | Freq: Four times a day (QID) | ORAL | Status: DC
  Administered 2023-06-21 – 2023-06-25 (×17): 80 mg via ORAL
  Filled 2023-06-21 (×19): qty 1.2

## 2023-06-21 NOTE — Plan of Care (Signed)
  Problem: Education: Goal: Ability to describe self-care measures that may prevent or decrease complications (Diabetes Survival Skills Education) will improve Outcome: Progressing   Problem: Coping: Goal: Ability to adjust to condition or change in health will improve Outcome: Progressing   Problem: Fluid Volume: Goal: Ability to maintain a balanced intake and output will improve Outcome: Progressing   Problem: Metabolic: Goal: Ability to maintain appropriate glucose levels will improve Outcome: Progressing   Problem: Nutritional: Goal: Maintenance of adequate nutrition will improve Outcome: Progressing   Problem: Skin Integrity: Goal: Risk for impaired skin integrity will decrease Outcome: Progressing   Problem: Activity: Goal: Risk for activity intolerance will decrease Outcome: Progressing   Problem: Nutrition: Goal: Adequate nutrition will be maintained Outcome: Progressing   Problem: Coping: Goal: Level of anxiety will decrease Outcome: Progressing   Problem: Elimination: Goal: Will not experience complications related to bowel motility Outcome: Progressing   Problem: Pain Management: Goal: General experience of comfort will improve Outcome: Progressing   Problem: Activity: Goal: Ability to tolerate increased activity will improve Outcome: Progressing   Problem: Nutritional: Goal: Will attain and maintain optimal nutritional status will improve Outcome: Progressing   Problem: Respiratory: Goal: Respiratory status will improve Outcome: Progressing   Problem: Skin Integrity: Goal: Will show signs of wound healing Outcome: Progressing

## 2023-06-21 NOTE — Progress Notes (Signed)
06/21/2023  Bradley [name] [id number] 1951/01/11  CARE TEAM: PCP: Medicine, [facility]  Outpatient Care Team: Patient Care Team: Medicine, [facility] as PCP - General (Family Medicine) Artis Delay, MD as Consulting Physician (Hematology and Oncology) Armbruster, Willaim Rayas, MD as Consulting Physician (Gastroenterology) Karie Soda, MD as Consulting Physician (Colon and Rectal Surgery)  Inpatient Treatment Team: Treatment Team:  Rolly Salter, MD [facility], Md, MD Bullins, Benard Halsted, RN Leona Carry, [place] Delmer Islam, MD Georgena Spurling, RN Eilleen Kempf, RN [place], Essence, NT   Problem List:   Principal Problem:   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 Active Problems:   Hyperlipemia   Essential hypertension   Bipolar I disorder (HCC)   Malignant neoplasm of prostate (HCC)   Diabetes mellitus, type II (HCC)   Abnormal CT scan   Stricture of descending colon (HCC)   Schizoaffective disorder, bipolar type (HCC)   Vitamin D deficiency   Severe obesity (BMI 35.0-35.9 with comorbidity) (HCC)   Colostomy in place (HCC)   06/14/2023  POST-OPERATIVE DIAGNOSIS:   COMPLETE COLON & BOWEL OBSTRUCTION DESCENDING COLON MASS/STRICTURE   PROCEDURE:   HARTMANN PROCEDURE (COLECTOMY WITH END COLECTOMY) MOBILIZATION OF SPLENIC FLEXURE OF COLON DIAGNOSTIC LAPAROSCOPY TRANSVERSUS ABDOMINIS PLANE (TAP) BLOCK - BILATERAL   SURGEON:  Ardeth Sportsman, MD  OR FINDINGS:    Patient had massively dilated small bowel stomach and especially colon with a transition point in the distal descending colon with thickening and stricture suspicious more for malignancy but possibly diverticular.   No obvious metastatic disease on visceral parietal peritoneum or liver.  No perforation or abscess.  Hartmann resection done requiring splenic flexure mobilization to get into reach to the left upper quadrant at premarked location.  2 L of  feculent gastric contents aspirated through a nasogastric tube.  Additional 3 L aspirated out dilated colon to decompress bowel and colon.    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  Has a yellow 30 French Pezzer tube in the colostomy to help the bowel decompressed.  He can be removed in the next few days   CASE DATA: Type of patient?: LDOW CASE (Surgical Hospitalist [facility] Inpatient) Status of Case? URGENT Add On Infection Present At Time Of Surgery (PATOS)?  PHLEGMON   Pathology A. COLON, LEFT, RESECTION:       Segment of colon with diverticulosis and focal submucosal acute and  chronic inflammation.       Polypoid colonic mucosa (x6) with hyperplastic changes, serrated  features and reactive changes, see comment.       Eight benign lymph nodes, negative for metastatic carcinoma (0/8).       Negative for dysplasia or malignancy.    Assessment [name] Stay = 10 days) 7 Days Post-Op    Bowel functioning.  Still with delirium and confusion.    Plan: -Solid carb modified diet .  Supplemental shake Glucerna -needs to mobilize.  PT rec SNF  -continue routine colostomy care.  WOC seeing for teaching.  Patient does't have a good comprehension of his colostomy.  Mild duskiness ostomy not surprising with massive dilation and ischemia but functioning and viable.  Would leave it be.  With bowel functioning afebrile and normal white blood count, doubt any postop abscess.  No threshold to test if it is not improved.  Acute kidney injury in setting of dehydration massive fluid shifts seem to be stabilizing.  With him being Angelita Ingles dependent perhaps could give a trial of  diuresis but will defer to medicine service.  Hopefully can thread the needle he does not get fluid overloaded on his lungs but does not get dehydrated with his kidneys  Mental status changes.  Presumption of delirium due to uremia and metabolic encephalopathy.  Defer workup for stroke or other more serious concerns with primary  service.      FEN - carb mod, glucerna VTE - lovenox ID - none Dispo - SNF.  Surgically stable when bed available   AKI - improving HTN HLD DM Bipolar H/o prostate cancer s/p radiotherapy (2021)     I reviewed nursing notes, hospitalist notes, last 24 h vitals and pain scores, last 48 h intake and output, last 24 h labs and trends, and last 24 h imaging results.  I have reviewed this patient's available data, including medical history, events of note, test results, etc as part of my evaluation.   A significant portion of that time was spent in counseling. Care during the described time interval was provided by me.  This care required moderate level of medical decision making.  06/21/2023    Subjective: (Chief complaint)  No major events.  Remains confused and pulled off colostomy bag once.  No emesis or fevers  Objective:  Vital signs:  Vitals:   06/20/23 1947 06/20/23 2137 06/20/23 2330 06/21/23 0435  BP:   121/68 134/71  Pulse:   (!) 57 64  Resp:   18 18  Temp:   98.6 F (37 C) 97.7 F (36.5 C)  TempSrc:    Oral  SpO2: 96% 93% 98% 99%  Weight:      Height:        Last BM Date : 06/20/23  Intake/Output   Yesterday:  10/31 0701 - 11/01 0700 In: 270 [P.O.:270] Out: 1100 [Urine:1050; Stool:50] This shift:  Total I/O In: 270 [P.O.:270] Out: 1100 [Urine:1050; Stool:50]  Bowel function:  Flatus: YES  BM:  YES  Drain: (No drain)   Physical Exam:  General: Pt awake/alert in no acute distress Eyes: PERRL, normal EOM.  Sclera clear.  No icterus Neuro: CN II-XII intact w/o focal sensory/motor deficits. Lymph: No head/neck/groin lymphadenopathy Psych: Rather confused wearing mitts.  He had pulled his colostomy appliance off but now it is back on.  Somewhat consolable and directable but really only oriented to self at this point  HENT: Normocephalic, Mucus membranes moist.  No thrush Neck: Supple, No tracheal deviation.  No obvious  thyromegaly Chest: No pain to chest wall compression.  Good respiratory excursion.  No audible wheezing CV:  Pulses intact.  Regular rhythm.  No major extremity edema MS: Normal AROM mjr joints.  No obvious deformity  Abdomen: Soft.  Nondistended.  Nontender.  No evidence of peritonitis.  No incarcerated hernias.  Colostomy mostly viable with some mild duskiness but latus and stool in bag.  Ext:   No deformity.  No mjr edema.  No cyanosis Skin: No petechiae / purpurea.  No major sores.  Warm and dry    Results:   Cultures: No results found for this or any previous visit (from the past 720 hour(s)).  Labs: Results for orders placed or performed during the hospital encounter of 06/11/23 (from the past 48 hour(s))  Glucose, capillary     Status: Abnormal   Collection Time: 06/19/23  8:08 AM  Result Value Ref Range   Glucose-Capillary 107 (H) 70 - 99 mg/dL    Comment: Glucose reference range applies only to samples taken after fasting  for at least 8 hours.   Comment 1 Notify RN    Comment 2 Document in Chart   Glucose, capillary     Status: Abnormal   Collection Time: 06/19/23 11:42 AM  Result Value Ref Range   Glucose-Capillary 145 (H) 70 - 99 mg/dL    Comment: Glucose reference range applies only to samples taken after fasting for at least 8 hours.   Comment 1 Notify RN    Comment 2 Document in Chart   Glucose, capillary     Status: Abnormal   Collection Time: 06/19/23  3:52 PM  Result Value Ref Range   Glucose-Capillary 152 (H) 70 - 99 mg/dL    Comment: Glucose reference range applies only to samples taken after fasting for at least 8 hours.  Glucose, capillary     Status: Abnormal   Collection Time: 06/19/23  9:14 PM  Result Value Ref Range   Glucose-Capillary 153 (H) 70 - 99 mg/dL    Comment: Glucose reference range applies only to samples taken after fasting for at least 8 hours.  Glucose, capillary     Status: Abnormal   Collection Time: 06/19/23 11:41 PM  Result Value  Ref Range   Glucose-Capillary 158 (H) 70 - 99 mg/dL    Comment: Glucose reference range applies only to samples taken after fasting for at least 8 hours.  Glucose, capillary     Status: Abnormal   Collection Time: 06/20/23  4:00 AM  Result Value Ref Range   Glucose-Capillary 137 (H) 70 - 99 mg/dL    Comment: Glucose reference range applies only to samples taken after fasting for at least 8 hours.  Glucose, capillary     Status: Abnormal   Collection Time: 06/20/23  7:26 AM  Result Value Ref Range   Glucose-Capillary 119 (H) 70 - 99 mg/dL    Comment: Glucose reference range applies only to samples taken after fasting for at least 8 hours.   Comment 1 Notify RN    Comment 2 Document in Chart   Basic metabolic panel     Status: Abnormal   Collection Time: 06/20/23  9:02 AM  Result Value Ref Range   Sodium 141 135 - 145 mmol/L   Potassium 3.7 3.5 - 5.1 mmol/L   Chloride 104 98 - 111 mmol/L   CO2 28 22 - 32 mmol/L   Glucose, Bld 131 (H) 70 - 99 mg/dL    Comment: Glucose reference range applies only to samples taken after fasting for at least 8 hours.   BUN 68 (H) 8 - 23 mg/dL   Creatinine, Ser 1.61 (H) 0.61 - 1.24 mg/dL   Calcium 8.6 (L) 8.9 - 10.3 mg/dL   GFR, Estimated >09 >60 mL/min    Comment: (NOTE) Calculated using the CKD-EPI Creatinine Equation (2021)    Anion gap 9 5 - 15    Comment: Performed at [facility], [address] [place], [place] [address]  Glucose, capillary     Status: Abnormal   Collection Time: 06/20/23 11:18 AM  Result Value Ref Range   Glucose-Capillary 139 (H) 70 - 99 mg/dL    Comment: Glucose reference range applies only to samples taken after fasting for at least 8 hours.  Ammonia     Status: Abnormal   Collection Time: 06/20/23  2:51 PM  Result Value Ref Range   Ammonia 66 (H) 9 - 35 umol/L    Comment: Performed at [facility], [address] [place],  [place] [address]  Valproic acid level     Status:  Abnormal   Collection Time: 06/20/23  2:51 PM  Result Value Ref Range   Valproic Acid Lvl 34 (L) 50.0 - 100.0 ug/mL    Comment: Performed at [facility], [address], [place], [place] [address]  TSH     Status: None   Collection Time: 06/20/23  2:51 PM  Result Value Ref Range   TSH 1.026 0.350 - 4.500 uIU/mL    Comment: Performed by a 3rd Generation assay with a functional sensitivity of <=0.01 uIU/mL. Performed at [facility], [address] [place], [place] [address]   T4, free     Status: None   Collection Time: 06/20/23  2:51 PM  Result Value Ref Range   Free T4 1.01 0.61 - 1.12 ng/dL    Comment: (NOTE) Biotin ingestion may interfere with free T4 tests. If the results are inconsistent with the TSH level, previous test results, or the clinical presentation, then consider biotin interference. If needed, order repeat testing after stopping biotin. Performed at [facility], [address] [place], [place] [address]   Procalcitonin     Status: None   Collection Time: 06/20/23  2:51 PM  Result Value Ref Range   Procalcitonin 1.78 ng/mL    Comment:        Interpretation: PCT > 0.5 ng/mL and <= 2 ng/mL: Systemic infection (sepsis) is possible, but other conditions are known to elevate PCT as well. (NOTE)       Sepsis PCT Algorithm           Lower Respiratory Tract                                      Infection PCT Algorithm    ----------------------------     ----------------------------         PCT < 0.25 ng/mL                PCT < 0.10 ng/mL          Strongly encourage             Strongly discourage   discontinuation of antibiotics    initiation of antibiotics    ----------------------------     -----------------------------       PCT 0.25 - 0.50 ng/mL            PCT 0.10 - 0.25 ng/mL               OR       >80% decrease in PCT            Discourage initiation of                                            antibiotics       Encourage discontinuation           of antibiotics    ----------------------------     -----------------------------         PCT >= 0.50 ng/mL              PCT 0.26 - 0.50 ng/mL                AND       <  80% decrease in PCT             Encourage initiation of                                             antibiotics       Encourage continuation           of antibiotics    ----------------------------     -----------------------------        PCT >= 0.50 ng/mL                  PCT > 0.50 ng/mL               AND         increase in PCT                  Strongly encourage                                      initiation of antibiotics    Strongly encourage escalation           of antibiotics                                     -----------------------------                                           PCT <= 0.25 ng/mL                                                 OR                                        > 80% decrease in PCT                                      Discontinue / Do not initiate                                             antibiotics  Performed at [facility], [address], [place], [place] [address]   C-reactive protein     Status: Abnormal   Collection Time: 06/20/23  2:51 PM  Result Value Ref Range   CRP 22.1 (H) <1.0 mg/dL    Comment: Performed at [facility], [address] [place], [place] [address]  CBC     Status: Abnormal   Collection Time: 06/20/23  2:51 PM  Result Value Ref Range   WBC 9.3 4.0 - 10.5 K/uL   RBC 4.33 4.22 - 5.81 MIL/uL   Hemoglobin 12.7 (L) 13.0 - 17.0 g/dL  HCT 40.6 39.0 - 52.0 %   MCV 93.8 80.0 - 100.0 fL   MCH 29.3 26.0 - 34.0 pg   MCHC 31.3 30.0 - 36.0 g/dL   RDW 96.0 45.4 - 09.8 %   Platelets 168 150 - 400 K/uL   nRBC 0.0 0.0 - 0.2 %    Comment: Performed at [facility], [address] [place], [place] [address]  Glucose, capillary     Status: Abnormal   Collection Time: 06/20/23   3:39 PM  Result Value Ref Range   Glucose-Capillary 127 (H) 70 - 99 mg/dL    Comment: Glucose reference range applies only to samples taken after fasting for at least 8 hours.   Comment 1 Notify RN    Comment 2 Document in Chart   Glucose, capillary     Status: Abnormal   Collection Time: 06/20/23  9:27 PM  Result Value Ref Range   Glucose-Capillary 118 (H) 70 - 99 mg/dL    Comment: Glucose reference range applies only to samples taken after fasting for at least 8 hours.  Glucose, capillary     Status: Abnormal   Collection Time: 06/21/23 12:42 AM  Result Value Ref Range   Glucose-Capillary 112 (H) 70 - 99 mg/dL    Comment: Glucose reference range applies only to samples taken after fasting for at least 8 hours.    Imaging / Studies: DG CHEST PORT 1 VIEW  Result Date: 06/20/2023 CLINICAL DATA:  Shortness of breath EXAM: PORTABLE CHEST 1 VIEW COMPARISON:  06/16/2023 FINDINGS: Enteric tube has been removed in the interval. Shallow inspiration. Heart size is somewhat obscured due to shallow inspiration but appears mildly enlarged. Atelectasis or infiltration in the lung bases with probable small pleural effusions. No pneumothorax. Mediastinal contours appear intact. Degenerative changes in the spine. IMPRESSION: Shallow inspiration with probable small effusions and atelectasis or infiltration in the bases. Electronically Signed   By: Burman Nieves M.D.   On: 06/20/2023 16:01    Medications / Allergies: per chart  Antibiotics: Anti-infectives (From admission, onward)    Start     Dose/Rate Route Frequency Ordered Stop   06/15/23 0200  cefoTEtan (CEFOTAN) 2 g in sodium chloride 0.9 % 100 mL IVPB        2 g 200 mL/hr over 30 Minutes Intravenous Every 12 hours 06/14/23 2009 06/15/23 0323   06/14/23 2200  cefoTEtan (CEFOTAN) 2 g in sodium chloride 0.9 % 100 mL IVPB  Status:  Discontinued        2 g 200 mL/hr over 30 Minutes Intravenous Every 12 hours 06/14/23 2005 06/14/23 2009    06/14/23 0845  cefoTEtan (CEFOTAN) 2 g in sodium chloride 0.9 % 100 mL IVPB        2 g 200 mL/hr over 30 Minutes Intravenous On call to O.R. 06/14/23 0745 06/14/23 1506         Note: Portions of this report may have been transcribed using voice recognition software. Every effort was made to ensure accuracy; however, inadvertent computerized transcription errors may be present.   Any transcriptional errors that result from this process are unintentional.    Ardeth Sportsman, MD, FACS, MASCRS Esophageal, Gastrointestinal & Colorectal Surgery Robotic and Minimally Invasive Surgery  [facility] [address] [place], [place] [address] [phone] Fax [phone] Main  CONTACT INFORMATION: Weekday (9AM-5PM): Call [facility] main office at [phone] Weeknight (5PM-9AM) or Weekend/Holiday: Check EPIC "Web Links"  tab & use "AMION" (password " [email or web address]") for General Surgery [facility] coverage  Please, DO NOT use SecureChat  (it is not reliable communication to reach operating surgeons & will lead to a delay in care).   Epic staff messaging available for outptient concerns needing 1-2 business day response.      06/21/2023  6:58 AM

## 2023-06-21 NOTE — Progress Notes (Signed)
[facility] Progress Note Patient: Bradley [name] QMV:[id number] DOB: Dec 28, 1950 DOA: 06/11/2023  DOS: the patient was seen and examined on 06/21/2023  Brief hospital course: Nicholas Trompeter is an 72 y.o. male past medical history significant for essential hypertension hyperlipidemia type 2 diabetes mellitus, bipolar disorder, chronic diverticular abscess comes in for constipation, [facility] incidentally found an acute kidney injury admitted by family medicine teaching service.  CT scan shows small diffuse large bowel dilation in the descending colon suspicious for colonic mass. 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  unfortunately he decompensated surgery was consulted, who performed laparoscopic surgery was done with left colectomy performed on 06/14/2023 transferred to PCCM postop remained intubated eventually extubated on 06/15/2023.  He then developed an ileus which is now resolved.   Patient appears to have ongoing encephalopathy likely in the setting of uremia. 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 Currently with a colostomy. Having good ostomy output. Out of bed to chair, encourage incentive spirometry. The patient is not realizing after being informed several times that he has a colostomy that he has to empty. Currently arranged to go to SNF.   Acute respiratory failure with hypoxia/postop ventilator management: S/p extubation on 06/15/2023 by PCCM. Still requiring 3 L oxygen keep saturation greater 90%. Appears to have bilateral expiratory wheezing. Chest x-ray performed.  Suspect volume overload.  Will provide IV Lasix and monitor. Also initiating nebulizer therapy.   Postop pain: Transition to  oral Tylenol which has been controlling his pain.   Acute kidney injury: With a baseline creatinine of 1.2-1.4 earlier this year.  Peaked at 3. Was started on IV fluids creatinine has improved. Now concerned about volume overload.  Acute metabolic encephalopathy. Likely in the setting of uremia. BUN significantly elevated in the setting of AKI. For now BUN trending down. Will monitor.  Metabolic workup unremarkable.  Monitor.   Essential hypertension: Given his bradycardia and hypotension I will be holding oral antihypertensive regimen.   Hyperkalemia: Resolved with correction of acidosis.   Mild thrombocytopenia: Likely in response to stress event. Continue to monitor   Hyperlipemia Resume statins as an outpatient.   Schizoaffective disorder, bipolar type (HCC): Resume risperidone.   Malignant neoplasm of prostate (HCC) Noted.   Diabetes mellitus, type II (HCC) A1c of 6.5, he is on no oral hypoglycemic agents or insulin's at home. Right now his blood sugars controlled continue sliding scale insulin.  Obesity Class 1 Body mass index is 36.64 kg/m.  Placing the pt at higher risk of poor outcomes.   Subjective: Mentation about the same.  No nausea or vomiting.  Oral intake limited.  Physical Exam: Vital expiratory wheezing. Bilateral swollen legs but No abdominal tenderness. Bowel sound present but Clear to auscultation.  Data Reviewed: I have Reviewed nursing notes, Vitals, and Lab results. Reviewed CBC and CMP.  Reordered CBC and CMP.  Disposition: Status is: Inpatient Remains inpatient appropriate because: Awaiting improvement in mentation and further workup  enoxaparin (LOVENOX) injection 40 mg Start: 06/16/23 1000   Family Communication: No one at bedside Level of care: Med-Surg   Vitals:   06/21/23 0435 06/21/23 0802 06/21/23 1222 06/21/23 1455  BP: 134/71  130/72   Pulse: 64  67   Resp: 18     Temp: 97.7 F (36.5  C)  98.8 F (37.1 C)    TempSrc: Oral     SpO2: 99% 98% (!) 88% 94%  Weight:      Height:         Author: Lynden Oxford, MD 06/21/2023 6:35 PM  Please look on [email or web address] to find out who is on call.

## 2023-06-21 NOTE — TOC Progression Note (Signed)
Transition of Care [facility]) - Progression Note    Patient Details  Name: Bradley [name] MRN: [id number] Date of Birth: 1950/08/27  [facility] of [facility] CM/SW Contact  Otelia Santee, LCSW Phone Number: 06/21/2023, 11:37 AM  Clinical Narrative:    Pt not medically ready for discharge. Soonest expected date for pt to be medically ready is Sunday 11/3. Pt's insurance auth for SNF expires tomorrow and new [facility] will need to be requested.   Expected Discharge Plan: Skilled Nursing Facility Barriers to Discharge: Continued Medical Work up, SNF Pending bed offer  Expected Discharge Plan and Services In-house Referral: Clinical Social Work Discharge Planning Services: NA Post Acute Care Choice: Skilled Nursing Facility Living arrangements for the past 2 months: Apartment                 DME Arranged: N/A DME Agency: NA                   Social Determinants of Health (SDOH) Interventions SDOH Screenings   Food Insecurity: No Food Insecurity (06/11/2023)  Housing: Low Risk  (06/11/2023)  Transportation Needs: No Transportation Needs (06/11/2023)  Utilities: Not At Risk (06/11/2023)  Financial Resource Strain: Not on File (12/07/2021)   Received from [employer], [place]  Physical Activity: Not on File (12/07/2021)   Received from [place], [place]  Social Connections: Not on File (05/02/2023)   Received from [facility]  Stress: Not on File (12/07/2021)   Received from [place], [place]  Tobacco Use: High Risk (06/14/2023)    Readmission Risk Interventions     No data to display

## 2023-06-22 DIAGNOSIS — K56600 Partial intestinal obstruction, unspecified as to cause: Secondary | ICD-10-CM | POA: Diagnosis not present

## 2023-06-22 LAB — BASIC METABOLIC PANEL
Anion gap: 10 (ref 5–15)
BUN: 55 mg/dL — ABNORMAL HIGH (ref 8–23)
CO2: 27 mmol/L (ref 22–32)
Calcium: 8.9 mg/dL (ref 8.9–10.3)
Chloride: 104 mmol/L (ref 98–111)
Creatinine, Ser: 1.3 mg/dL — ABNORMAL HIGH (ref 0.61–1.24)
GFR, Estimated: 59 mL/min — ABNORMAL LOW (ref >60)
Glucose, Bld: 124 mg/dL — ABNORMAL HIGH (ref 70–99)
Potassium: 4 mmol/L (ref 3.5–5.1)
Sodium: 141 mmol/L (ref 135–145)

## 2023-06-22 LAB — GLUCOSE, CAPILLARY
Glucose-Capillary: 102 mg/dL — ABNORMAL HIGH (ref 70–99)
Glucose-Capillary: 137 mg/dL — ABNORMAL HIGH (ref 70–99)
Glucose-Capillary: 153 mg/dL — ABNORMAL HIGH (ref 70–99)
Glucose-Capillary: 116 mg/dL — ABNORMAL HIGH (ref 70–99)

## 2023-06-22 LAB — MAGNESIUM: Magnesium: 2.5 mg/dL — ABNORMAL HIGH (ref 1.7–2.4)

## 2023-06-22 LAB — PREALBUMIN: Prealbumin: 9 mg/dL — ABNORMAL LOW (ref 18–38)

## 2023-06-22 MED ORDER — ACETAMINOPHEN 500 MG PO TABS
1000.0000 mg | ORAL_TABLET | Freq: Three times a day (TID) | ORAL | Status: DC
Start: 2023-06-22 — End: 2023-06-25
  Administered 2023-06-22 – 2023-06-25 (×10): 1000 mg via ORAL
  Filled 2023-06-22 (×10): qty 2

## 2023-06-22 MED ORDER — PROSOURCE PLUS PO LIQD
30.0000 mL | Freq: Two times a day (BID) | ORAL | Status: DC
  Administered 2023-06-22 – 2023-06-25 (×7): 30 mL via ORAL
  Filled 2023-06-22 (×7): qty 30

## 2023-06-22 MED ORDER — PHYTONADIONE 5 MG PO TABS
5.0000 mg | ORAL_TABLET | Freq: Once | ORAL | Status: AC
  Administered 2023-06-22: 5 mg via ORAL
  Filled 2023-06-22: qty 1

## 2023-06-22 MED ORDER — ADULT MULTIVITAMIN W/MINERALS CH
1.0000 | ORAL_TABLET | Freq: Every day | ORAL | Status: DC
  Administered 2023-06-22 – 2023-06-25 (×4): 1 via ORAL
  Filled 2023-06-22 (×4): qty 1

## 2023-06-22 MED ORDER — GLUCERNA SHAKE PO LIQD
237.0000 mL | Freq: Three times a day (TID) | ORAL | Status: DC
  Administered 2023-06-22 – 2023-06-25 (×11): 237 mL via ORAL
  Filled 2023-06-22 (×12): qty 237

## 2023-06-22 NOTE — Plan of Care (Signed)
  Problem: Education: Goal: Ability to describe self-care measures that may prevent or decrease complications (Diabetes Survival Skills Education) will improve Outcome: Progressing Goal: Individualized Educational Video(s) Outcome: Progressing   Problem: Coping: Goal: Ability to adjust to condition or change in health will improve Outcome: Progressing   Problem: Fluid Volume: Goal: Ability to maintain a balanced intake and output will improve Outcome: Progressing   Problem: Health Behavior/Discharge Planning: Goal: Ability to identify and utilize available resources and services will improve Outcome: Progressing Goal: Ability to manage health-related needs will improve Outcome: Progressing   Problem: Metabolic: Goal: Ability to maintain appropriate glucose levels will improve Outcome: Progressing   Problem: Nutritional: Goal: Maintenance of adequate nutrition will improve Outcome: Progressing Goal: Progress toward achieving an optimal weight will improve Outcome: Progressing   Problem: Skin Integrity: Goal: Risk for impaired skin integrity will decrease Outcome: Progressing   Problem: Tissue Perfusion: Goal: Adequacy of tissue perfusion will improve Outcome: Progressing   Problem: Education: Goal: Knowledge of General Education information will improve Description: Including pain rating scale, medication(s)/side effects and non-pharmacologic comfort measures Outcome: Progressing   Problem: Health Behavior/Discharge Planning: Goal: Ability to manage health-related needs will improve Outcome: Progressing   Problem: Clinical Measurements: Goal: Ability to maintain clinical measurements within normal limits will improve Outcome: Progressing Goal: Will remain free from infection Outcome: Progressing Goal: Diagnostic test results will improve Outcome: Progressing Goal: Respiratory complications will improve Outcome: Progressing Goal: Cardiovascular complication will  be avoided Outcome: Progressing   Problem: Activity: Goal: Risk for activity intolerance will decrease Outcome: Progressing   Problem: Nutrition: Goal: Adequate nutrition will be maintained Outcome: Progressing   Problem: Coping: Goal: Level of anxiety will decrease Outcome: Progressing   Problem: Elimination: Goal: Will not experience complications related to bowel motility Outcome: Progressing Goal: Will not experience complications related to urinary retention Outcome: Progressing   Problem: Pain Management: Goal: General experience of comfort will improve Outcome: Progressing   Problem: Safety: Goal: Ability to remain free from injury will improve Outcome: Progressing   Problem: Skin Integrity: Goal: Risk for impaired skin integrity will decrease Outcome: Progressing   Problem: Education: Goal: Understanding of discharge needs will improve Outcome: Progressing Goal: Verbalization of understanding of the causes of altered bowel function will improve Outcome: Progressing   Problem: Activity: Goal: Ability to tolerate increased activity will improve Outcome: Progressing   Problem: Bowel/Gastric: Goal: Gastrointestinal status for postoperative course will improve Outcome: Progressing   Problem: Health Behavior/Discharge Planning: Goal: Identification of community resources to assist with postoperative recovery needs will improve Outcome: Progressing   Problem: Nutritional: Goal: Will attain and maintain optimal nutritional status will improve Outcome: Progressing   Problem: Clinical Measurements: Goal: Postoperative complications will be avoided or minimized Outcome: Progressing   Problem: Respiratory: Goal: Respiratory status will improve Outcome: Progressing   Problem: Skin Integrity: Goal: Will show signs of wound healing Outcome: Progressing   Problem: Activity: Goal: Ability to tolerate increased activity will improve Outcome: Progressing    Problem: Respiratory: Goal: Ability to maintain a clear airway and adequate ventilation will improve Outcome: Progressing   Problem: Role Relationship: Goal: Method of communication will improve Outcome: Progressing

## 2023-06-22 NOTE — Plan of Care (Signed)
  Problem: Clinical Measurements: Goal: Ability to maintain clinical measurements within normal limits will improve Outcome: Progressing   Problem: Education: Goal: Ability to describe self-care measures that may prevent or decrease complications (Diabetes Survival Skills Education) will improve Outcome: Not Progressing   Problem: Fluid Volume: Goal: Ability to maintain a balanced intake and output will improve Outcome: Not Progressing   Problem: Health Behavior/Discharge Planning: Goal: Ability to identify and utilize available resources and services will improve Outcome: Not Progressing Goal: Ability to manage health-related needs will improve Outcome: Not Progressing

## 2023-06-22 NOTE — Progress Notes (Signed)
[facility] Progress Note Patient: Bradley [name] ZOX:[id number] DOB: 11/07/1950 DOA: 06/11/2023  DOS: the patient was seen and examined on 06/22/2023  Brief hospital course: Bradley [name] is an 72 y.o. male past medical history significant for essential hypertension hyperlipidemia type 2 diabetes mellitus, bipolar disorder, chronic diverticular abscess comes in for constipation, presents to the [facility] with constipation incidentally found an acute kidney injury admitted by family medicine teaching service.  CT scan shows small diffuse large bowel dilation in the descending colon suspicious for colonic mass. 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  unfortunately he decompensated surgery was consulted, who performed laparoscopic surgery was done with left colectomy performed on 06/14/2023 transferred to PCCM postop remained intubated eventually extubated on 06/15/2023.  He then developed an ileus which is now resolved.   Patient appears to have ongoing encephalopathy likely in the setting of uremia. Mentation significantly better on 11/2.  Will request TOC to initiate authorization again.  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 Currently with a colostomy. Having good ostomy output. Out of bed to chair, encourage incentive spirometry. The patient is not realizing after being informed several times that he has a colostomy that he has to empty. Currently arranged to go to SNF.   Acute respiratory failure with hypoxia/postop ventilator management: S/p extubation on 06/15/2023 by PCCM. Still requiring 3 L oxygen keep saturation greater 90%. Appears to have bilateral expiratory wheezing. Chest x-ray performed.  Suspect volume overload.  Treated with IV  Lasix. Continue as needed nebulizer. Wean to room air.   Postop pain: Transition to oral Tylenol which has been controlling his pain.   Acute kidney injury: With a baseline creatinine of 1.2-1.4 earlier this year.  Peaked at 3.  Treated with IV fluid, now concerned about volume overload.  Acute metabolic encephalopathy. Likely in the setting of uremia. BUN significantly elevated in the setting of AKI. For now BUN trending down. Will monitor.  Metabolic workup unremarkable. No focal deficits.  No asterixis.  Mentation improving significantly on 11/2.Marland Kitchen   Essential hypertension: Given his bradycardia and hypotension I will be holding oral antihypertensive regimen.   Hyperkalemia: Resolved with correction of acidosis.   Mild thrombocytopenia: Likely in response to stress event. Continue to monitor   Hyperlipemia Resume statins as an outpatient.   Schizoaffective disorder, bipolar type (HCC): Resume risperidone.   Malignant neoplasm of prostate (HCC) Noted.   Diabetes mellitus, type II (HCC) A1c of 6.5, he is on no oral hypoglycemic agents or insulin's at home. Right now his blood sugars controlled continue sliding scale insulin.  Obesity Class 1 Body mass index is 36.64 kg/m.  Placing the pt at higher risk of poor outcomes.   Subjective: No nausea no vomiting.  Sitting at the side of the bed eating food.  Answered all questions appropriately.  Follows all commands.  No acute complaint.  Wants to go home.  Physical Exam: General: in Mild distress, No Rash Cardiovascular: S1 and S2 Present, No Murmur Respiratory: Good respiratory effort, Bilateral Air entry present. No Crackles, No wheezes Abdomen: Bowel Sound present, No tenderness Extremities: No edema Neuro: Alert and oriented x3, no new focal deficit  Data Reviewed: I have Reviewed nursing notes, Vitals, and Lab results. Since last encounter, pertinent lab results CBC and BMP   . I have ordered test including  CBC and BMP  .   Disposition: Status is: Inpatient Remains inpatient appropriate because: Awaiting prior authorization now.  Monitor for stabilization of mentation.  enoxaparin (LOVENOX) injection 40 mg Start: 06/16/23 1000   Family Communication: No one at bedside. Level of care: Med-Surg   Vitals:   06/21/23 1952 06/21/23 2134 06/22/23 0631 06/22/23 1437  BP: 136/76  135/69 (!) 152/72  Pulse: 65  (!) 59 67  Resp: 16  16 17   Temp: 97.7 F (36.5 C)  98.1 F (36.7 C) 98.4 F (36.9 C)  TempSrc:      SpO2: 94% 93% 98% 94%  Weight:      Height:         Author: Lynden Oxford, MD 06/22/2023 6:21 PM  Please look on [email or web address] to find out who is on call.

## 2023-06-22 NOTE — TOC Progression Note (Signed)
Transition of Care [facility]) - Progression Note    Patient Details  Name: Bradley [name] MRN: [id number] Date of Birth: 03-Jun-1951  Transition of [facility]) CM/SW Contact  Adrian Prows, RN Phone Number: 06/22/2023, 3:10 PM  Clinical Narrative:    [name] ins Berkley Harvey; given Auth ID # [id number].   Expected Discharge Plan: Skilled Nursing Facility Barriers to Discharge: Continued Medical Work up, SNF Pending bed offer  Expected Discharge Plan and Services In-house Referral: Clinical Social Work Discharge Planning Services: NA Post Acute Care Choice: Skilled Nursing Facility Living arrangements for the past 2 months: Apartment                 DME Arranged: N/A DME Agency: NA                   Social Determinants of Health (SDOH) Interventions SDOH Screenings   Food Insecurity: No Food Insecurity (06/11/2023)  Housing: Low Risk  (06/11/2023)  Transportation Needs: No Transportation Needs (06/11/2023)  Utilities: Not At Risk (06/11/2023)  Financial Resource Strain: Not on File (12/07/2021)   Received from [place], [place]  Physical Activity: Not on File (12/07/2021)   Received from [place], [place]  Social Connections: Not on File (05/02/2023)   Received from [facility]  Stress: Not on File (12/07/2021)   Received from [place], [place]  Tobacco Use: High Risk (06/14/2023)    Readmission Risk Interventions     No data to display

## 2023-06-22 NOTE — Progress Notes (Addendum)
Physical Therapy Treatment Patient Details Name: Bradley [name] MRN: [id number] DOB: 10-Feb-1951 Today's Date: 06/22/2023   History of Present Illness Pt admitted on 06/11/23 from home with complete colon and bowel obstruction and now s/p colectomy with end colostomy.  Pt decompensated post op and placed on vent with extubation 10/26.  Pt with hx of DM and bipolar    PT Comments  Pt awake and attempting to participate today however reports pain and weakness limiting.  Pt also presents with decreased cognition however lethargy appears improved compared to last session (although pt easily back to sleep once session ended).  Pt requiring multimodal cues for technique and too weak to stand with Stedy and +2 assistance today.  Pt was able to ambulate a few feet on evaluation (10/27) and now not able to even stand, so it appears pt's cognition and mobility has significantly declined.  Pt does not appear safe to return home alone at discharge.  Patient will benefit from continued inpatient follow up therapy, <3 hours/day.  Pt also with [place] in one nare (however appears to be mostly mouth breathing).  SPO2 94% prior to bed mobility and also 94% upon return to supine while on 2L O2.     If plan is discharge home, recommend the following: Direct supervision/assist for medications management;Assist for transportation;Assistance with cooking/housework;Help with stairs or ramp for entrance;Direct supervision/assist for financial management;Assistance with feeding;Two people to help with walking and/or transfers;A lot of help with bathing/dressing/bathroom   Can travel by private vehicle     No  Equipment Recommendations  None recommended by PT    Recommendations for Other Services       Precautions / Restrictions Precautions Precautions: Fall Precaution Comments: colostomy, usually full of air     Mobility  Bed Mobility Overal bed mobility: Needs Assistance Bed Mobility: Supine to Sit, Sit to  Supine     Supine to sit: Max assist, HOB elevated, Used rails, +2 for physical assistance Sit to supine: Max assist, +2 for physical assistance, +2 for safety/equipment   General bed mobility comments: initiating movement but unable to complete requiring extensive assist    Transfers Overall transfer level: Needs assistance   Transfers: Sit to/from Stand Sit to Stand: +2 physical assistance, Total assist           General transfer comment: attempted to stand x2 with stedy however pt unable to lift buttocks from bed even with +2 assist Transfer via Lift Equipment: Stedy  Ambulation/Gait                   Stairs             Wheelchair Mobility     Tilt Bed    Modified Rankin (Stroke Patients Only)       Balance                                            Cognition Arousal: Alert Behavior During Therapy: Flat affect Overall Cognitive Status: Impaired/Different from baseline Area of Impairment: Attention, Awareness, Memory, Following commands, Safety/judgement, Problem solving, Orientation                 Orientation Level: Place, Situation, Time Current Attention Level: Focused   Following Commands: Follows one step commands inconsistently, Follows one step commands with increased time Safety/Judgement: Decreased awareness of deficits   Problem Solving: Slow processing,  Decreased initiation, Difficulty sequencing, Requires verbal cues, Requires tactile cues General Comments: pt awake and participating as able but easily falls back asleep when not engaged        Exercises      General Comments        Pertinent Vitals/Pain Pain Assessment Pain Assessment: Faces Faces Pain Scale: Hurts little more Pain Location: abdomen when sitting Pain Descriptors / Indicators: Grimacing, Moaning Pain Intervention(s): Repositioned, Monitored during session    Home Living                          Prior Function             PT Goals (current goals can now be found in the care plan section) Progress towards PT goals: Not progressing toward goals - comment (decreased cognition)    Frequency    Min 1X/week      PT Plan      Co-evaluation              AM-PAC PT "6 Clicks" Mobility   Outcome Measure  Help needed turning from your back to your side while in a flat bed without using bedrails?: Total Help needed moving from lying on your back to sitting on the side of a flat bed without using bedrails?: Total Help needed moving to and from a bed to a chair (including a wheelchair)?: Total Help needed standing up from a chair using your arms (e.g., wheelchair or bedside chair)?: Total Help needed to walk in hospital room?: Total Help needed climbing 3-5 steps with a railing? : Total 6 Click Score: 6    End of Session Equipment Utilized During Treatment: Gait belt;Oxygen Activity Tolerance: Patient limited by fatigue Patient left: in bed;with bed alarm set;with call bell/phone within reach   PT Visit Diagnosis: Muscle weakness (generalized) (M62.81)     Time: 6962-9528 PT Time Calculation (min) (ACUTE ONLY): 12 min  Charges:    $Therapeutic Activity: 8-22 mins PT General Charges $$ ACUTE PT VISIT: 1 Visit                     Thomasene Mohair PT, DPT Physical Therapist Acute Rehabilitation Services Office: [phone]  [name] 06/22/2023, 1:12 PM

## 2023-06-22 NOTE — Progress Notes (Signed)
Patient ID: Bradley [name], male   DOB: [birth year], 71 y.o.   MRN: [id number] [facility] Progress Note  8 Days Post-Op  Subjective: He is alert but not oriented at time of my exam - he tells me it is 1924. He does say no when asked if he is having any abdominal pain. He is sitting upright eating breakfast  Objective: Vital signs in last 24 hours: Temp:  [97.7 F (36.5 C)-98.8 F (37.1 C)] 98.1 F (36.7 C) (11/02 0631) Pulse Rate:  [59-67] 59 (11/02 0631) Resp:  [16] 16 (11/02 0631) BP: (130-136)/(69-76) 135/69 (11/02 0631) SpO2:  [88 %-98 %] 98 % (11/02 0631) FiO2 (%):  [32 %] 32 % (11/01 1455) Last BM Date : 06/21/23  Intake/Output from previous day: 11/01 0701 - 11/02 0700 In: 240 [P.O.:240] Out: 1075 [Urine:825; Stool:250] Intake/Output this shift: No intake/output data recorded.  PE: Gen:  Alert, NAD, disoriented Abd- protuberant but soft, appropriately tender, Stoma not visualized due to large amount soft stool.  Midline wound with wicks removed.  C/d/i  Lab Results:  Recent Labs    06/20/23 1451 06/21/23 1012  WBC 9.3 9.7  HGB 12.7* 12.9*  HCT 40.6 40.4  PLT 168 173    BMET Recent Labs    06/20/23 0902 06/21/23 1309  NA 141 142  K 3.7 3.9  CL 104 104  CO2 28 27  GLUCOSE 131* 150*  BUN 68* 58*  CREATININE 1.25* 1.25*  CALCIUM 8.6* 8.8*   PT/INR Recent Labs    06/21/23 1012  LABPROT 20.9*  INR 1.8*   CMP     Component Value Date/Time   NA 142 06/21/2023 1309   NA 139 06/17/2013 1114   K 3.9 06/21/2023 1309   K 4.2 06/17/2013 1114   CL 104 06/21/2023 1309   CO2 27 06/21/2023 1309   CO2 21 (L) 06/17/2013 1114   GLUCOSE 150 (H) 06/21/2023 1309   GLUCOSE 126 06/17/2013 1114   BUN 58 (H) 06/21/2023 1309   BUN 12.9 06/17/2013 1114   CREATININE 1.25 (H) 06/21/2023 1309   CREATININE 1.1 06/17/2013 1114   CALCIUM 8.8 (L) 06/21/2023 1309   CALCIUM 9.6 06/17/2013 1114   PROT 7.6 06/21/2023 1309   PROT 8.8 (H) 06/17/2013 1114    ALBUMIN 2.8 (L) 06/21/2023 1309   ALBUMIN 3.6 06/17/2013 1114   AST 12 (L) 06/21/2023 1309   AST 7 06/17/2013 1114   ALT 21 06/21/2023 1309   ALT 8 06/17/2013 1114   ALKPHOS 53 06/21/2023 1309   ALKPHOS 53 06/17/2013 1114   BILITOT 0.8 06/21/2023 1309   BILITOT 0.23 06/17/2013 1114   GFRNONAA >60 06/21/2023 1309   GFRAA >60 03/28/2020 1525   Lipase     Component Value Date/Time   LIPASE 24 06/11/2023 1023       Studies/Results: DG CHEST PORT 1 VIEW  Result Date: 06/20/2023 CLINICAL DATA:  Shortness of breath EXAM: PORTABLE CHEST 1 VIEW COMPARISON:  06/16/2023 FINDINGS: Enteric tube has been removed in the interval. Shallow inspiration. Heart size is somewhat obscured due to shallow inspiration but appears mildly enlarged. Atelectasis or infiltration in the lung bases with probable small pleural effusions. No pneumothorax. Mediastinal contours appear intact. Degenerative changes in the spine. IMPRESSION: Shallow inspiration with probable small effusions and atelectasis or infiltration in the bases. Electronically Signed   By: Burman Nieves M.D.   On: 06/20/2023 16:01    Anti-infectives: Anti-infectives (From admission, onward)    Start  Dose/Rate Route Frequency Ordered Stop   06/15/23 0200  cefoTEtan (CEFOTAN) 2 g in sodium chloride 0.9 % 100 mL IVPB        2 g 200 mL/hr over 30 Minutes Intravenous Every 12 hours 06/14/23 2009 06/15/23 0323   06/14/23 2200  cefoTEtan (CEFOTAN) 2 g in sodium chloride 0.9 % 100 mL IVPB  Status:  Discontinued        2 g 200 mL/hr over 30 Minutes Intravenous Every 12 hours 06/14/23 2005 06/14/23 2009   06/14/23 0845  cefoTEtan (CEFOTAN) 2 g in sodium chloride 0.9 % 100 mL IVPB        2 g 200 mL/hr over 30 Minutes Intravenous On call to O.R. 06/14/23 0745 06/14/23 1506        Assessment/Plan POD 8, s/p lap converted to open Hartmann's for Sigmoid/descending colon stricture by Dr. Michaell Cowing 10/25 -tolerating carb mod -needs to  mobilize.  PT rec SNF -glucerna  -continue routine colostomy care.  WOC seeing for teaching.  Patient does't have a good comprehension of his colostomy.  Will ned Short-term SNF to help his get used to this and manage this prior to DC home given he lives by himself.  -prealbumin 9 -ok ostomy output -path: A. COLON, LEFT, RESECTION:       Segment of colon with diverticulosis and focal submucosal acute and  chronic inflammation.       Polypoid colonic mucosa (x6) with hyperplastic changes, serrated  features and reactive changes, see comment.       Eight benign lymph nodes, negative for metastatic carcinoma (0/8).       Negative for dysplasia or malignancy.    FEN - carb mod, glucerna VTE - lovenox ID - none Dispo - SNF.  Surgically stable when bed available   AKI - improving HTN HLD DM Bipolar H/o prostate cancer s/p radiotherapy (2021)     LOS: 11 days   Mary Sella. Andrey Campanile, MD, FACS General, Bariatric, & Minimally Invasive Surgery [facility],  [facility]  06/22/2023, 8:42 AM Please see Amion for pager number during day hours 7:00am-4:30pm

## 2023-06-23 DIAGNOSIS — K56600 Partial intestinal obstruction, unspecified as to cause: Secondary | ICD-10-CM | POA: Diagnosis not present

## 2023-06-23 LAB — CBC
HCT: 37.6 % — ABNORMAL LOW (ref 39.0–52.0)
Hemoglobin: 11.6 g/dL — ABNORMAL LOW (ref 13.0–17.0)
MCH: 29.5 pg (ref 26.0–34.0)
MCHC: 30.9 g/dL (ref 30.0–36.0)
MCV: 95.7 fL (ref 80.0–100.0)
Platelets: 197 10*3/uL (ref 150–400)
RBC: 3.93 MIL/uL — ABNORMAL LOW (ref 4.22–5.81)
RDW: 15.3 % (ref 11.5–15.5)
WBC: 11.5 10*3/uL — ABNORMAL HIGH (ref 4.0–10.5)
nRBC: 0 % (ref 0.0–0.2)

## 2023-06-23 LAB — GLUCOSE, CAPILLARY
Glucose-Capillary: 103 mg/dL — ABNORMAL HIGH (ref 70–99)
Glucose-Capillary: 143 mg/dL — ABNORMAL HIGH (ref 70–99)
Glucose-Capillary: 124 mg/dL — ABNORMAL HIGH (ref 70–99)
Glucose-Capillary: 140 mg/dL — ABNORMAL HIGH (ref 70–99)

## 2023-06-23 LAB — BASIC METABOLIC PANEL
Anion gap: 9 (ref 5–15)
BUN: 60 mg/dL — ABNORMAL HIGH (ref 8–23)
CO2: 27 mmol/L (ref 22–32)
Calcium: 8.5 mg/dL — ABNORMAL LOW (ref 8.9–10.3)
Chloride: 104 mmol/L (ref 98–111)
Creatinine, Ser: 1.47 mg/dL — ABNORMAL HIGH (ref 0.61–1.24)
GFR, Estimated: 51 mL/min — ABNORMAL LOW (ref >60)
Glucose, Bld: 103 mg/dL — ABNORMAL HIGH (ref 70–99)
Potassium: 3.9 mmol/L (ref 3.5–5.1)
Sodium: 140 mmol/L (ref 135–145)

## 2023-06-23 MED ORDER — ALBUMIN HUMAN 25 % IV SOLN
12.5000 g | Freq: Once | INTRAVENOUS | Status: AC
  Administered 2023-06-23: 12.5 g via INTRAVENOUS
  Filled 2023-06-23: qty 50

## 2023-06-23 NOTE — Plan of Care (Signed)
  Problem: Education: Goal: Ability to describe self-care measures that may prevent or decrease complications (Diabetes Survival Skills Education) will improve Outcome: Progressing Goal: Individualized Educational Video(s) Outcome: Progressing   Problem: Coping: Goal: Ability to adjust to condition or change in health will improve Outcome: Progressing   Problem: Fluid Volume: Goal: Ability to maintain a balanced intake and output will improve Outcome: Progressing   Problem: Health Behavior/Discharge Planning: Goal: Ability to identify and utilize available resources and services will improve Outcome: Progressing Goal: Ability to manage health-related needs will improve Outcome: Progressing   Problem: Metabolic: Goal: Ability to maintain appropriate glucose levels will improve Outcome: Progressing   Problem: Nutritional: Goal: Maintenance of adequate nutrition will improve Outcome: Progressing Goal: Progress toward achieving an optimal weight will improve Outcome: Progressing   Problem: Skin Integrity: Goal: Risk for impaired skin integrity will decrease Outcome: Progressing   Problem: Tissue Perfusion: Goal: Adequacy of tissue perfusion will improve Outcome: Progressing   Problem: Education: Goal: Knowledge of General Education information will improve Description: Including pain rating scale, medication(s)/side effects and non-pharmacologic comfort measures Outcome: Progressing   Problem: Health Behavior/Discharge Planning: Goal: Ability to manage health-related needs will improve Outcome: Progressing   Problem: Clinical Measurements: Goal: Ability to maintain clinical measurements within normal limits will improve Outcome: Progressing Goal: Will remain free from infection Outcome: Progressing Goal: Diagnostic test results will improve Outcome: Progressing Goal: Respiratory complications will improve Outcome: Progressing Goal: Cardiovascular complication will  be avoided Outcome: Progressing   Problem: Activity: Goal: Risk for activity intolerance will decrease Outcome: Progressing   Problem: Nutrition: Goal: Adequate nutrition will be maintained Outcome: Progressing   Problem: Coping: Goal: Level of anxiety will decrease Outcome: Progressing   Problem: Elimination: Goal: Will not experience complications related to bowel motility Outcome: Progressing Goal: Will not experience complications related to urinary retention Outcome: Progressing   Problem: Pain Management: Goal: General experience of comfort will improve Outcome: Progressing   Problem: Safety: Goal: Ability to remain free from injury will improve Outcome: Progressing   Problem: Skin Integrity: Goal: Risk for impaired skin integrity will decrease Outcome: Progressing   Problem: Education: Goal: Understanding of discharge needs will improve Outcome: Progressing Goal: Verbalization of understanding of the causes of altered bowel function will improve Outcome: Progressing   Problem: Activity: Goal: Ability to tolerate increased activity will improve Outcome: Progressing   Problem: Bowel/Gastric: Goal: Gastrointestinal status for postoperative course will improve Outcome: Progressing   Problem: Health Behavior/Discharge Planning: Goal: Identification of community resources to assist with postoperative recovery needs will improve Outcome: Progressing   Problem: Nutritional: Goal: Will attain and maintain optimal nutritional status will improve Outcome: Progressing   Problem: Clinical Measurements: Goal: Postoperative complications will be avoided or minimized Outcome: Progressing   Problem: Respiratory: Goal: Respiratory status will improve Outcome: Progressing   Problem: Skin Integrity: Goal: Will show signs of wound healing Outcome: Progressing   Problem: Activity: Goal: Ability to tolerate increased activity will improve Outcome: Progressing    Problem: Respiratory: Goal: Ability to maintain a clear airway and adequate ventilation will improve Outcome: Progressing   Problem: Role Relationship: Goal: Method of communication will improve Outcome: Progressing

## 2023-06-23 NOTE — Progress Notes (Signed)
Patient ID: Bradley [name], male   DOB: 29-Apr-1951, 72 y.o.   MRN: [id number] [facility] Surgery Progress Note  9 Days Post-Op  Subjective: He is alert  Nurse/tech doing bathing; there is discolored drainage on his midline gauze Having thick colostomy output  Objective: Vital signs in last 24 hours: Temp:  [97.8 F (36.6 C)-98.7 F (37.1 C)] 98.7 F (37.1 C) (11/03 0616) Pulse Rate:  [65-69] 65 (11/03 0616) Resp:  [17-18] 18 (11/03 0616) BP: (118-152)/(64-72) 118/64 (11/03 0616) SpO2:  [90 %-95 %] 95 % (11/03 0616) Last BM Date : 06/22/23  Intake/Output from previous day: 11/02 0701 - 11/03 0700 In: 360 [P.O.:360] Out: 1150 [Urine:750; Stool:400] Intake/Output this shift: No intake/output data recorded.  PE: Gen:  Alert, NAD, disoriented Abd- protuberant but soft, appropriately tender, Stoma not visualized due to large amount soft stool. Discolored drainage on gauze; I probed area of the midline wound with cotton tip applicator. Couldn't express any addl fluid; fascia feels intact  Lab Results:  Recent Labs    06/21/23 1012 06/23/23 0634  WBC 9.7 11.5*  HGB 12.9* 11.6*  HCT 40.4 37.6*  PLT 173 197    BMET Recent Labs    06/22/23 0921 06/23/23 0634  NA 141 140  K 4.0 3.9  CL 104 104  CO2 27 27  GLUCOSE 124* 103*  BUN 55* 60*  CREATININE 1.30* 1.47*  CALCIUM 8.9 8.5*   PT/INR Recent Labs    06/21/23 1012  LABPROT 20.9*  INR 1.8*   CMP     Component Value Date/Time   NA 140 06/23/2023 0634   NA 139 06/17/2013 1114   K 3.9 06/23/2023 0634   K 4.2 06/17/2013 1114   CL 104 06/23/2023 0634   CO2 27 06/23/2023 0634   CO2 21 (L) 06/17/2013 1114   GLUCOSE 103 (H) 06/23/2023 0634   GLUCOSE 126 06/17/2013 1114   BUN 60 (H) 06/23/2023 0634   BUN 12.9 06/17/2013 1114   CREATININE 1.47 (H) 06/23/2023 0634   CREATININE 1.1 06/17/2013 1114   CALCIUM 8.5 (L) 06/23/2023 0634   CALCIUM 9.6 06/17/2013 1114   PROT 7.6 06/21/2023 1309   PROT 8.8  (H) 06/17/2013 1114   ALBUMIN 2.8 (L) 06/21/2023 1309   ALBUMIN 3.6 06/17/2013 1114   AST 12 (L) 06/21/2023 1309   AST 7 06/17/2013 1114   ALT 21 06/21/2023 1309   ALT 8 06/17/2013 1114   ALKPHOS 53 06/21/2023 1309   ALKPHOS 53 06/17/2013 1114   BILITOT 0.8 06/21/2023 1309   BILITOT 0.23 06/17/2013 1114   GFRNONAA 51 (L) 06/23/2023 0634   GFRAA >60 03/28/2020 1525   Lipase     Component Value Date/Time   LIPASE 24 06/11/2023 1023       Studies/Results: No results found.  Anti-infectives: Anti-infectives (From admission, onward)    Start     Dose/Rate Route Frequency Ordered Stop   06/15/23 0200  cefoTEtan (CEFOTAN) 2 g in sodium chloride 0.9 % 100 mL IVPB        2 g 200 mL/hr over 30 Minutes Intravenous Every 12 hours 06/14/23 2009 06/15/23 0323   06/14/23 2200  cefoTEtan (CEFOTAN) 2 g in sodium chloride 0.9 % 100 mL IVPB  Status:  Discontinued        2 g 200 mL/hr over 30 Minutes Intravenous Every 12 hours 06/14/23 2005 06/14/23 2009   06/14/23 0845  cefoTEtan (CEFOTAN) 2 g in sodium chloride 0.9 % 100 mL IVPB  2 g 200 mL/hr over 30 Minutes Intravenous On call to O.R. 06/14/23 0745 06/14/23 1506        Assessment/Plan POD 9 s/p lap converted to open Hartmann's for Sigmoid/descending colon stricture by Dr. Michaell Cowing 10/25 -tolerating carb mod -needs to mobilize.  PT rec SNF -glucerna  -continue routine colostomy care.  WOC seeing for teaching.  Patient does't have a good comprehension of his colostomy.  Will ned Short-term SNF to help his get used to this and manage this prior to DC home given he lives by himself.  -prealbumin 9 -ok ostomy output  - WOUND CARE - will wick midline opening with w-d dry gauze and cover with gauze -path: A. COLON, LEFT, RESECTION:       Segment of colon with diverticulosis and focal submucosal acute and  chronic inflammation.       Polypoid colonic mucosa (x6) with hyperplastic changes, serrated  features and reactive changes,  see comment.       Eight benign lymph nodes, negative for metastatic carcinoma (0/8).       Negative for dysplasia or malignancy.    FEN - carb mod, glucerna VTE - lovenox ID - none Dispo - SNF.  Surgically stable when bed available   AKI - improving HTN HLD DM Bipolar H/o prostate cancer s/p radiotherapy (2021)     LOS: 12 days   Mary Sella. Andrey Campanile, MD, FACS General, Bariatric, & Minimally Invasive Surgery [facility],  [facility]  06/23/2023, 9:55 AM Please see Amion for pager number during day hours 7:00am-4:30pm

## 2023-06-23 NOTE — Progress Notes (Signed)
OT Cancellation Note  Patient Details Name: Bradley [name] MRN: [id number] DOB: Jan 23, 1951   Cancelled Treatment:    Reason Eval/Treat Not Completed: Patient declined, no reason specified Pt hard to understand but pt did state 'Later' when ask about therapy and OOB.  Will check back onpt as time allows or next day.  Alba Cory 06/23/2023, 10:21 AM

## 2023-06-23 NOTE — Progress Notes (Signed)
[facility] Progress Note Patient: Bradley [name] ZOX:[id number] DOB: May 31, 1951 DOA: 06/11/2023  DOS: the patient was seen and examined on 06/23/2023  Brief hospital course: Bradley [name] is an 72 y.o. male past medical history significant for essential hypertension hyperlipidemia type 2 diabetes mellitus, bipolar disorder, chronic diverticular abscess comes in for constipation, presents to the [facility] ED with constipation incidentally found an acute kidney injury admitted by family medicine teaching service.  CT scan shows small diffuse large bowel dilation in the descending colon suspicious for colonic mass. 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  unfortunately he decompensated surgery was consulted, who performed laparoscopic surgery was done with left colectomy performed on 06/14/2023 transferred to PCCM postop remained intubated eventually extubated on 06/15/2023.  He then developed an ileus which is now resolved.   Patient appears to have ongoing encephalopathy likely in the setting of uremia. Mentation significantly better on 11/2.  Will request TOC to initiate authorization again.  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 Currently with a colostomy. Having good ostomy output. Out of bed to chair, encourage incentive spirometry. The patient is not realizing after being informed several times that he has a colostomy that he has to empty. Currently arranged to go to SNF. There is discolored drainage on his midline gauze.  General surgery recommended 2-week midline opening with wet-to-dry gauze.   Acute respiratory failure with hypoxia/postop ventilator management: S/p extubation on 06/15/2023 by PCCM. Still requiring 3 L oxygen keep saturation greater  90%. Appears to have bilateral expiratory wheezing. Chest x-ray performed.  Suspect volume overload.  Treated with IV Lasix. Continue as needed nebulizer. Wean to room air.   Postop pain: Transition to oral Tylenol which has been controlling his pain.   Acute kidney injury: With a baseline creatinine of 1.2-1.4 earlier this year.  Peaked at 3.  Treated with IV fluid,.  Worsening again.  Etiology not clear likely from poor p.o. intake.  Will treat with IV albumin and monitor.  Acute metabolic encephalopathy. Likely in the setting of uremia. BUN significantly elevated in the setting of AKI. For now BUN trending down. Will monitor.  Metabolic workup unremarkable. No focal deficits.  No asterixis.  Mentation improving significantly on 11/2.Marland Kitchen   Essential hypertension: Given his bradycardia and hypotension I will be holding oral antihypertensive regimen.   Hyperkalemia: Resolved with correction of acidosis.   Mild thrombocytopenia: Likely in response to stress event. Continue to monitor   Hyperlipemia Resume statins as an outpatient.   Schizoaffective disorder, bipolar type (HCC): Resume risperidone.   Malignant neoplasm of prostate (HCC) Noted.   Diabetes mellitus, type II (HCC) A1c of 6.5, he is on no oral hypoglycemic agents or insulin's at home. Right now his blood sugars controlled continue sliding scale insulin.  Obesity Class 1 Body mass index is 36.64 kg/m.  Placing the pt at higher risk of poor outcomes.   Subjective: No abdominal pain.  No nausea no vomiting.  No fever no chills.  Oral intake minimal.  Physical Exam: General: in Mild distress, No Rash Cardiovascular: S1 and S2 Present, No Murmur Respiratory: Good respiratory effort, Bilateral Air entry present. No Crackles, No wheezes Abdomen: Bowel Sound present, distended, mild diffuse tenderness Extremities: Trace edema Neuro: Alert and oriented x3, no new focal deficit  Data Reviewed: I have Reviewed  nursing notes, Vitals,  and Lab results. Since last encounter, pertinent lab results CBC and BMP   . I have ordered test including CBC and BMP  .  Discussed with general surgery  Disposition: Status is: Inpatient Remains inpatient appropriate because: Monitor for improvement in serum creatinine likely responsible for his encephalopathy  enoxaparin (LOVENOX) injection 40 mg Start: 06/16/23 1000   Family Communication: No one at bedside Level of care: Med-Surg   Vitals:   06/22/23 1437 06/22/23 2052 06/23/23 0616 06/23/23 1253  BP: (!) 152/72 135/69 118/64 (!) 115/58  Pulse: 67 69 65 68  Resp: 17 18 18 20   Temp: 98.4 F (36.9 C) 97.8 F (36.6 C) 98.7 F (37.1 C) 98.2 F (36.8 C)  TempSrc:  Oral Oral Oral  SpO2: 94% 90% 95% 94%  Weight:      Height:         Author: Lynden Oxford, MD 06/23/2023 2:50 PM  Please look on [email or web address] to find out who is on call.

## 2023-06-23 NOTE — TOC Progression Note (Signed)
Transition of Care [facility]) - Progression Note    Patient Details  Name: Bradley [name] MRN: [id number] Date of Birth: [birth year]  Transition of Care [facility]) CM/SW Contact  Darleene Cleaver, [place] Phone Number: 06/23/2023, 4:59 PM  Clinical Narrative:     TOC still waiting for insurance authorization.  Expected Discharge Plan: Skilled Nursing Facility Barriers to Discharge: Continued Medical Work up, SNF Pending bed offer  Expected Discharge Plan and Services In-house Referral: Clinical Social Work Discharge Planning Services: NA Post Acute Care Choice: Skilled Nursing Facility Living arrangements for the past 2 months: Apartment                 DME Arranged: N/A DME Agency: NA                   Social Determinants of Health (SDOH) Interventions SDOH Screenings   Food Insecurity: No Food Insecurity (06/11/2023)  Housing: Low Risk  (06/11/2023)  Transportation Needs: No Transportation Needs (06/11/2023)  Utilities: Not At Risk (06/11/2023)  Financial Resource Strain: Not on File (12/07/2021)   Received from [place], [place]  Physical Activity: Not on File (12/07/2021)   Received from [place], [place]  Social Connections: Not on File (05/02/2023)   Received from [facility]  Stress: Not on File (12/07/2021)   Received from [place], [place]  Tobacco Use: High Risk (06/14/2023)    Readmission Risk Interventions     No data to display

## 2023-06-24 ENCOUNTER — Inpatient Hospital Stay (HOSPITAL_COMMUNITY): Payer: Medicare HMO

## 2023-06-24 DIAGNOSIS — K56600 Partial intestinal obstruction, unspecified as to cause: Secondary | ICD-10-CM | POA: Diagnosis not present

## 2023-06-24 LAB — CBC
HCT: 38.2 % — ABNORMAL LOW (ref 39.0–52.0)
Hemoglobin: 11.7 g/dL — ABNORMAL LOW (ref 13.0–17.0)
MCH: 29.8 pg (ref 26.0–34.0)
MCHC: 30.6 g/dL (ref 30.0–36.0)
MCV: 97.2 fL (ref 80.0–100.0)
Platelets: 213 10*3/uL (ref 150–400)
RBC: 3.93 MIL/uL — ABNORMAL LOW (ref 4.22–5.81)
RDW: 15.3 % (ref 11.5–15.5)
WBC: 9.7 10*3/uL (ref 4.0–10.5)
nRBC: 0 % (ref 0.0–0.2)

## 2023-06-24 LAB — BASIC METABOLIC PANEL
Anion gap: 12 (ref 5–15)
BUN: 62 mg/dL — ABNORMAL HIGH (ref 8–23)
CO2: 27 mmol/L (ref 22–32)
Calcium: 9.1 mg/dL (ref 8.9–10.3)
Chloride: 102 mmol/L (ref 98–111)
Creatinine, Ser: 1.45 mg/dL — ABNORMAL HIGH (ref 0.61–1.24)
GFR, Estimated: 52 mL/min — ABNORMAL LOW (ref >60)
Glucose, Bld: 113 mg/dL — ABNORMAL HIGH (ref 70–99)
Potassium: 4.1 mmol/L (ref 3.5–5.1)
Sodium: 141 mmol/L (ref 135–145)

## 2023-06-24 LAB — GLUCOSE, CAPILLARY
Glucose-Capillary: 134 mg/dL — ABNORMAL HIGH (ref 70–99)
Glucose-Capillary: 122 mg/dL — ABNORMAL HIGH (ref 70–99)
Glucose-Capillary: 129 mg/dL — ABNORMAL HIGH (ref 70–99)
Glucose-Capillary: 138 mg/dL — ABNORMAL HIGH (ref 70–99)
Glucose-Capillary: 109 mg/dL — ABNORMAL HIGH (ref 70–99)

## 2023-06-24 MED ORDER — FUROSEMIDE 10 MG/ML IJ SOLN
20.0000 mg | Freq: Once | INTRAMUSCULAR | Status: AC
  Administered 2023-06-24: 20 mg via INTRAVENOUS
  Filled 2023-06-24: qty 2

## 2023-06-24 NOTE — Consult Note (Signed)
WOC Nurse ostomy follow up Stoma type/location: LUQ colostomy  Stomal assessment/size: 2" round, stoma is 80% red with some brown slough noted from 6 o'clock 10 o'clock  Peristomal assessment: intact, midline incision to right of stoma that does not interfere with pouching  Treatment options for stomal/peristomal skin: 2" barrier ring  Output minimal soft brown stool in pouch  Ostomy pouching: 2 piece 2 3/4"  Education provided: I introduced myself as Geneticist, molecular. I asked patient if he had a colostomy bag and he said yes.  I told him I was there to teach him how to care for it when he got home.  Patient sat up on side of bed. We reviewed emptying with lock and roll mechanism and cleaning spout with toilet paper wick.  We removed the old pouch using push pull technique and cleaned stoma and surrounding skin with water moistened washcloth. At this point patient got a phone call from brother and was no longer engaged in learning.    I cut new barrier at 2", placed 2" barrier ring around stoma and secured skin barrier to that. Snapped new 2 3/4" pouch on.  At that time patient was done with his phone call and requested to lie back down.    I placed (4) sets of 2 3/4" skin barrier [place] #2), 2 3/4" pouch [place] [phone]) and 2" barrier rings [place] [phone]) in room.   WOC will continue to follow with patient and attempt teaching. Patient is awaiting SNF placement.   Thank you,    Priscella Mann MSN, RN-BC, CWOCN [phone]  Enrolled patient in [facility] Discharge program: Yes

## 2023-06-24 NOTE — TOC Progression Note (Signed)
Transition of Care [facility]) - Progression Note    Patient Details  Name: Bradley [name] MRN: [id number] Date of Birth: [birth year]  Transition of Care [facility]) CM/SW Contact  Otelia Santee, LCSW Phone Number: 06/24/2023, 2:05 PM  Clinical Narrative:    Pt's insurance authorization for SNF is still pending.  Per Amy w/ [employer] pt's auth request is being sent to MD for review.    Expected Discharge Plan: Skilled Nursing Facility Barriers to Discharge: Continued Medical Work up, SNF Pending bed offer  Expected Discharge Plan and Services In-house Referral: Clinical Social Work Discharge Planning Services: NA Post Acute Care Choice: Skilled Nursing Facility Living arrangements for the past 2 months: Apartment                 DME Arranged: N/A DME Agency: NA                   Social Determinants of Health (SDOH) Interventions SDOH Screenings   Food Insecurity: No Food Insecurity (06/11/2023)  Housing: Low Risk  (06/11/2023)  Transportation Needs: No Transportation Needs (06/11/2023)  Utilities: Not At Risk (06/11/2023)  Financial Resource Strain: Not on File (12/07/2021)   Received from [place], [place]  Physical Activity: Not on File (12/07/2021)   Received from [place], [place]  Social Connections: Not on File (05/02/2023)   Received from [facility]  Stress: Not on File (12/07/2021)   Received from [place], [place]  Tobacco Use: High Risk (06/14/2023)    Readmission Risk Interventions     No data to display

## 2023-06-24 NOTE — Progress Notes (Signed)
[facility] Progress Note Patient: Bradley [name] UVO:[id number] DOB: 09-11-1950 DOA: 06/11/2023  DOS: the patient was seen and examined on 06/24/2023  Brief hospital course: Bradley [name] is an 72 y.o. male past medical history significant for essential hypertension hyperlipidemia type 2 diabetes mellitus, bipolar disorder, chronic diverticular abscess comes in for constipation, presents to the [place] [facility] ED with constipation incidentally found an acute kidney injury admitted by family medicine teaching service.  CT scan shows small diffuse large bowel dilation in the descending colon suspicious for colonic mass. 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  unfortunately he decompensated surgery was consulted, who performed laparoscopic surgery was done with left colectomy performed on 06/14/2023 transferred to PCCM postop remained intubated eventually extubated on 06/15/2023.  He then developed an ileus which is now resolved.   Patient appears to have ongoing encephalopathy likely in the setting of uremia. Mentation significantly better on 11/2.  Will request TOC to initiate authorization again.  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 Currently with a colostomy. Having good ostomy output. Out of bed to chair, encourage incentive spirometry. The patient is not realizing after being informed several times that he has a colostomy that he has to empty. Currently arranged to go to SNF. There is discolored drainage on his midline gauze.  General surgery recommended 2-week midline opening with wet-to-dry gauze.   Acute respiratory failure with hypoxia/postop ventilator management: S/p extubation on 06/15/2023 by PCCM. Appears to have bilateral expiratory wheezing. Chest  x-ray shows small effusion and atelectasis. Continue as needed nebulizer. Wean to room air.   Postop pain: Transition to oral Tylenol which has been controlling his pain.   Acute kidney injury: With a baseline creatinine of 1.2-1.4 earlier this year.  Peaked at 3.  Treated with IV fluid,.  Worsening again.  Etiology not clear likely from poor p.o. intake.   Treated with IV albumin. Will provide IV Lasix.  Acute metabolic encephalopathy. Likely in the setting of uremia. BUN significantly elevated in the setting of AKI. No focal deficits.  No asterixis.  Mentation improving significantly on 11/2.   Essential hypertension: Given his bradycardia and hypotension I will be holding oral antihypertensive regimen.   Hyperkalemia: Resolved with correction of acidosis.   Mild thrombocytopenia: Likely in response to stress event. Continue to monitor   Hyperlipemia Resume statins as an outpatient.   Schizoaffective disorder, bipolar type (HCC): Resume risperidone.   Malignant neoplasm of prostate (HCC) Noted.   Diabetes mellitus, type II (HCC) A1c of 6.5, he is on no oral hypoglycemic agents or insulin's at home. Right now his blood sugars controlled continue sliding scale insulin.  Obesity Class 1 Body mass index is 36.64 kg/m.  Placing the pt at higher risk of poor outcomes.   Subjective: No nausea no vomiting no fever no chills.  Breathing okay.  Physical Exam: General: in Mild distress, No Rash Cardiovascular: S1 and S2 Present, No Murmur Respiratory: Good respiratory effort, Bilateral Air entry present.  Bilateral basal crackles, occasional wheezes Abdomen: Bowel Sound present, No tenderness Extremities: Bilateral edema Neuro: Alert and oriented x3, no new focal deficit, asterixis negative  Data Reviewed: I have Reviewed nursing notes, Vitals, and Lab results. Since last encounter, pertinent lab results BMP BMP   . I have ordered test including BMP  .   Disposition: Status is: Inpatient  Remains inpatient appropriate because: Monitor for improvement in renal function  enoxaparin (LOVENOX) injection 40 mg Start: 06/16/23 1000   Family Communication: No one at bedside Level of care: Med-Surg   Vitals:   06/23/23 1952 06/23/23 2133 06/24/23 0542 06/24/23 1403  BP: (!) 127/56  127/66 128/60  Pulse: 68  63 60  Resp: 16  17 18   Temp: 98.3 F (36.8 C)  98.1 F (36.7 C) (!) 97.3 F (36.3 C)  TempSrc: Oral     SpO2: (!) 83% 93% 98% 91%  Weight:      Height:         Author: Lynden Oxford, MD 06/24/2023 5:40 PM  Please look on [email or web address] to find out who is on call.

## 2023-06-24 NOTE — Evaluation (Signed)
Occupational Therapy Evaluation Patient Details Name: Bradley [name] MRN: [id number] DOB: 03-Mar-1951 Today's Date: 06/24/2023   History of Present Illness Patient is a 72 year old male who was admitted on 06/11/23 from home with complete colon and bowel obstruction and now s/p colectomy with end colostomy.  Pt decompensated post op and placed on vent with extubation 10/26.  PMH: DM and bipolar   Clinical Impression   Patient is a 72 year old male who was admitted for above. Patient was living at home independently prior level. Currently, Patient was noted to have decreased functional activity tolerance, decreased endurance, decreased standing balance, decreased safety awareness, and decreased knowledge of AD/AE impacting participation in ADLs. Patient will benefit from continued inpatient follow up therapy, <3 hours/day.          If plan is discharge home, recommend the following: A lot of help with bathing/dressing/bathroom;A lot of help with walking and/or transfers;Assistance with cooking/housework;Direct supervision/assist for medications management;Assist for transportation;Help with stairs or ramp for entrance;Direct supervision/assist for financial management    Functional Status Assessment  Patient has had a recent decline in their functional status and demonstrates the ability to make significant improvements in function in a reasonable and predictable amount of time.  Equipment Recommendations  None recommended by OT       Precautions / Restrictions Precautions Precautions: Fall Precaution Comments: colostomy, usually full of air Restrictions Weight Bearing Restrictions: No      Mobility Bed Mobility Overal bed mobility: Needs Assistance Bed Mobility: Supine to Sit, Sit to Supine   Sidelying to sit: Mod assist   Sit to supine: Mod assist   General bed mobility comments: with cues for rolling v.s.s sitting upright which patient did not seem to follow.          Balance Overall balance assessment: Needs assistance Sitting-balance support: Feet supported Sitting balance-Leahy Scale: Fair     Standing balance support: Bilateral upper extremity supported, During functional activity, Reliant on assistive device for balance Standing balance-Leahy Scale: Fair Standing balance comment: with use of RW         ADL either performed or assessed with clinical judgement   ADL Overall ADL's : Needs assistance/impaired   Eating/Feeding Details (indicate cue type and reason): declined   Grooming Details (indicate cue type and reason): declined   Upper Body Bathing Details (indicate cue type and reason): declined   Lower Body Bathing Details (indicate cue type and reason): declined assuming max A with inability to reach feet at bed level or EOB         Toilet Transfer: Minimal assistance;Ambulation;Rolling walker (2 wheels) Toilet Transfer Details (indicate cue type and reason): to take steps up head of bed to get in better spot.           General ADL Comments: patient was crooked in bed with patient reporting that he was hurting all over. patietn was educated on letting nurse know when he was hurting. patient reported "why are they not helping me?". nurse was called into room and nurse reported that patient was not due for medications for 20 more mins at this time. patient did not adhere to log rolling for bed mobility even wit education on how this will help with pain management proper movements to avoid stress on abdomen. patient reported that he did hurt. patient was upset with pain levels, reported lack of help from MD and nurse, and the end of the world. patients thoughts and feelings were heard. patient returned to  bed at end of session.     Vision   Vision Assessment?: No apparent visual deficits            Pertinent Vitals/Pain Pain Assessment Pain Assessment: Faces Faces Pain Scale: Hurts little more Pain Location: abdomen when  sitting Pain Descriptors / Indicators: Grimacing, Moaning Pain Intervention(s): Limited activity within patient's tolerance, Monitored during session, Repositioned     Extremity/Trunk Assessment Upper Extremity Assessment Upper Extremity Assessment: Overall WFL for tasks assessed (able to reach arms up overhead resting position.)           Communication Communication Communication: Difficulty following commands/understanding;Difficulty communicating thoughts/reduced clarity of speech   Cognition Arousal: Alert Behavior During Therapy: Flat affect Overall Cognitive Status: Impaired/Different from baseline Area of Impairment: Attention, Awareness, Memory, Following commands, Safety/judgement, Problem solving, Orientation     Orientation Level: Place, Situation, Time Current Attention Level: Focused   Following Commands: Follows one step commands inconsistently, Follows one step commands with increased time Safety/Judgement: Decreased awareness of deficits Awareness: Intellectual Problem Solving: Slow processing, Decreased initiation, Difficulty sequencing, Requires verbal cues, Requires tactile cues                  Home Living Family/patient expects to be discharged to:: Private residence Living Arrangements: Alone Available Help at Discharge: Family;Available PRN/intermittently Type of Home: Apartment Home Access: Level entry     Home Layout: One level               Home Equipment: None   Additional Comments: info taken from chart. patient was not forthcoming with information.      Prior Functioning/Environment Prior Level of Function : Independent/Modified Independent                        OT Problem List: Decreased activity tolerance;Decreased coordination;Impaired balance (sitting and/or standing);Decreased safety awareness;Decreased knowledge of precautions;Decreased knowledge of use of DME or AE      OT Treatment/Interventions:  Self-care/ADL training;Therapeutic exercise;DME and/or AE instruction;Therapeutic activities;Patient/family education;Balance training    OT Goals(Current goals can be found in the care plan section) Acute Rehab OT Goals Patient Stated Goal: none stated OT Goal Formulation: Patient unable to participate in goal setting Time For Goal Achievement: 07/08/23 Potential to Achieve Goals: Fair  OT Frequency: Min 1X/week       AM-PAC OT "6 Clicks" Daily Activity     Outcome Measure Help from another person eating meals?: Total (NPO) Help from another person taking care of personal grooming?: A Little Help from another person toileting, which includes using toliet, bedpan, or urinal?: A Lot Help from another person bathing (including washing, rinsing, drying)?: A Lot Help from another person to put on and taking off regular upper body clothing?: A Lot Help from another person to put on and taking off regular lower body clothing?: A Lot 6 Click Score: 12   End of Session Equipment Utilized During Treatment: Rolling walker (2 wheels) Nurse Communication: Mobility status  Activity Tolerance: Patient tolerated treatment well Patient left: in bed;with call bell/phone within reach;with bed alarm set  OT Visit Diagnosis: Unsteadiness on feet (R26.81);Other abnormalities of gait and mobility (R26.89);Muscle weakness (generalized) (M62.81)                Time: 4098-1191 OT Time Calculation (min): 15 min Charges:  OT General Charges $OT Visit: 1 Visit OT Evaluation $OT Eval Low Complexity: 1 Low  Gwendy Boeder OTR/L, MS Acute Rehabilitation Department Office# [phone]   Barnabas Lister  Cutler 06/24/2023, 4:20 PM

## 2023-06-24 NOTE — Plan of Care (Signed)
  Problem: Coping: Goal: Ability to adjust to condition or change in health will improve Outcome: Progressing   Problem: Fluid Volume: Goal: Ability to maintain a balanced intake and output will improve Outcome: Progressing   Problem: Metabolic: Goal: Ability to maintain appropriate glucose levels will improve Outcome: Progressing   Problem: Nutritional: Goal: Maintenance of adequate nutrition will improve Outcome: Progressing   Problem: Skin Integrity: Goal: Risk for impaired skin integrity will decrease Outcome: Progressing   Problem: Clinical Measurements: Goal: Ability to maintain clinical measurements within normal limits will improve Outcome: Progressing   Problem: Pain Management: Goal: General experience of comfort will improve Outcome: Progressing   Problem: Safety: Goal: Ability to remain free from injury will improve Outcome: Progressing   Problem: Elimination: Goal: Will not experience complications related to bowel motility Outcome: Progressing

## 2023-06-24 NOTE — Progress Notes (Signed)
Patient ID: Bradley [name], male   DOB: September 12, 1950, 72 y.o.   MRN: [id number] [facility] Surgery Progress Note  10 Days Post-Op  Subjective: Mental status significantly improved since my visit with him 10/31. Denies abdominal pain. Tolerating diet without n/v. Ostomy productive  Objective: Vital signs in last 24 hours: Temp:  [98.1 F (36.7 C)-98.3 F (36.8 C)] 98.1 F (36.7 C) (11/04 0542) Pulse Rate:  [63-68] 63 (11/04 0542) Resp:  [16-20] 17 (11/04 0542) BP: (115-127)/(56-66) 127/66 (11/04 0542) SpO2:  [83 %-98 %] 98 % (11/04 0542) Last BM Date : 06/24/23  Intake/Output from previous day: 11/03 0701 - 11/04 0700 In: 533 [P.O.:480; IV Piggyback:53] Out: 900 [Urine:700; Stool:200] Intake/Output this shift: No intake/output data recorded.  PE: Gen:  Alert, NAD, disoriented Abd- protuberant but soft, appropriately tender, Stoma not visualized due to large amount soft stool. Gauze still with discolored drainage but no active drainage from incision itself. No erythema or induration. No fluid expressed with gentle probing and fascia appears intact. See below    Lab Results:  Recent Labs    06/23/23 0634 06/24/23 0513  WBC 11.5* 9.7  HGB 11.6* 11.7*  HCT 37.6* 38.2*  PLT 197 213    BMET Recent Labs    06/23/23 0634 06/24/23 0513  NA 140 141  K 3.9 4.1  CL 104 102  CO2 27 27  GLUCOSE 103* 113*  BUN 60* 62*  CREATININE 1.47* 1.45*  CALCIUM 8.5* 9.1   PT/INR Recent Labs    06/21/23 1012  LABPROT 20.9*  INR 1.8*   CMP     Component Value Date/Time   NA 141 06/24/2023 0513   NA 139 06/17/2013 1114   K 4.1 06/24/2023 0513   K 4.2 06/17/2013 1114   CL 102 06/24/2023 0513   CO2 27 06/24/2023 0513   CO2 21 (L) 06/17/2013 1114   GLUCOSE 113 (H) 06/24/2023 0513   GLUCOSE 126 06/17/2013 1114   BUN 62 (H) 06/24/2023 0513   BUN 12.9 06/17/2013 1114   CREATININE 1.45 (H) 06/24/2023 0513   CREATININE 1.1 06/17/2013 1114   CALCIUM 9.1 06/24/2023 0513    CALCIUM 9.6 06/17/2013 1114   PROT 7.6 06/21/2023 1309   PROT 8.8 (H) 06/17/2013 1114   ALBUMIN 2.8 (L) 06/21/2023 1309   ALBUMIN 3.6 06/17/2013 1114   AST 12 (L) 06/21/2023 1309   AST 7 06/17/2013 1114   ALT 21 06/21/2023 1309   ALT 8 06/17/2013 1114   ALKPHOS 53 06/21/2023 1309   ALKPHOS 53 06/17/2013 1114   BILITOT 0.8 06/21/2023 1309   BILITOT 0.23 06/17/2013 1114   GFRNONAA 52 (L) 06/24/2023 0513   GFRAA >60 03/28/2020 1525   Lipase     Component Value Date/Time   LIPASE 24 06/11/2023 1023       Studies/Results: No results found.  Anti-infectives: Anti-infectives (From admission, onward)    Start     Dose/Rate Route Frequency Ordered Stop   06/15/23 0200  cefoTEtan (CEFOTAN) 2 g in sodium chloride 0.9 % 100 mL IVPB        2 g 200 mL/hr over 30 Minutes Intravenous Every 12 hours 06/14/23 2009 06/15/23 0323   06/14/23 2200  cefoTEtan (CEFOTAN) 2 g in sodium chloride 0.9 % 100 mL IVPB  Status:  Discontinued        2 g 200 mL/hr over 30 Minutes Intravenous Every 12 hours 06/14/23 2005 06/14/23 2009   06/14/23 0845  cefoTEtan (CEFOTAN) 2 g in sodium chloride  0.9 % 100 mL IVPB        2 g 200 mL/hr over 30 Minutes Intravenous On call to O.R. 06/14/23 0745 06/14/23 1506        Assessment/Plan POD 10 s/p lap converted to open Hartmann's for Sigmoid/descending colon stricture by Dr. Michaell Cowing 10/25 -tolerating carb mod -needs to mobilize.  PT rec SNF -glucerna  -continue routine colostomy care.  WOC seeing for teaching.  Patient does't have a good comprehension of his colostomy.  Will ned Short-term SNF to help his get used to this and manage this prior to DC home given he lives by himself. This am he expressed motivation to learn to care for it with ultimate goal of returning to his home -prealbumin 9 -ok ostomy output  - WOUND CARE - wick midline opening with wet to dry gauze and cover with gauze. May have had superficial fluid collection that has now expressed. No  further drainage this am and wound very shallow  -path with inflammation. Neg for malignancy   FEN - carb mod, glucerna VTE - lovenox ID - none Dispo - SNF.  Surgically stable when bed available   AKI - improving HTN HLD DM Bipolar H/o prostate cancer s/p radiotherapy (2021)     LOS: 13 days   Eric Form, [facility] 06/24/2023, 9:05 AM Please see Amion for pager number during day hours 7:00am-4:30pm

## 2023-06-25 DIAGNOSIS — K56600 Partial intestinal obstruction, unspecified as to cause: Secondary | ICD-10-CM | POA: Diagnosis not present

## 2023-06-25 LAB — CBC
HCT: 37.6 % — ABNORMAL LOW (ref 39.0–52.0)
Hemoglobin: 11.8 g/dL — ABNORMAL LOW (ref 13.0–17.0)
MCH: 30.4 pg (ref 26.0–34.0)
MCHC: 31.4 g/dL (ref 30.0–36.0)
MCV: 96.9 fL (ref 80.0–100.0)
Platelets: 206 10*3/uL (ref 150–400)
RBC: 3.88 MIL/uL — ABNORMAL LOW (ref 4.22–5.81)
RDW: 15.3 % (ref 11.5–15.5)
WBC: 10.7 10*3/uL — ABNORMAL HIGH (ref 4.0–10.5)
nRBC: 0 % (ref 0.0–0.2)

## 2023-06-25 LAB — BASIC METABOLIC PANEL
Anion gap: 11 (ref 5–15)
BUN: 58 mg/dL — ABNORMAL HIGH (ref 8–23)
CO2: 26 mmol/L (ref 22–32)
Calcium: 8.9 mg/dL (ref 8.9–10.3)
Chloride: 104 mmol/L (ref 98–111)
Creatinine, Ser: 1.2 mg/dL (ref 0.61–1.24)
GFR, Estimated: >60 mL/min (ref >60)
Glucose, Bld: 109 mg/dL — ABNORMAL HIGH (ref 70–99)
Potassium: 4.1 mmol/L (ref 3.5–5.1)
Sodium: 141 mmol/L (ref 135–145)

## 2023-06-25 LAB — GLUCOSE, CAPILLARY
Glucose-Capillary: 109 mg/dL — ABNORMAL HIGH (ref 70–99)
Glucose-Capillary: 111 mg/dL — ABNORMAL HIGH (ref 70–99)

## 2023-06-25 MED ORDER — PANTOPRAZOLE SODIUM 40 MG PO TBEC: 40.0000 mg | DELAYED_RELEASE_TABLET | Freq: Every day | ORAL | 0 refills | Status: DC

## 2023-06-25 MED ORDER — RISPERIDONE 0.25 MG PO TABS: 0.2500 mg | ORAL_TABLET | Freq: Every day | ORAL | 0 refills | Status: DC

## 2023-06-25 MED ORDER — ADULT MULTIVITAMIN W/MINERALS CH: 1.0000 | ORAL_TABLET | Freq: Every day | ORAL | 0 refills | Status: DC

## 2023-06-25 MED ORDER — ACETAMINOPHEN 500 MG PO TABS: 1000.0000 mg | ORAL_TABLET | Freq: Three times a day (TID) | ORAL | Status: DC | PRN

## 2023-06-25 MED ORDER — LACTULOSE 10 GM/15ML PO SOLN: 10.0000 g | Freq: Every day | ORAL | 0 refills | Status: DC

## 2023-06-25 MED ORDER — FUROSEMIDE 20 MG PO TABS: 20.0000 mg | ORAL_TABLET | ORAL | 0 refills | Status: DC

## 2023-06-25 MED ORDER — GLUCERNA SHAKE PO LIQD: 237.0000 mL | Freq: Three times a day (TID) | ORAL | 0 refills | Status: DC

## 2023-06-25 MED ORDER — SIMETHICONE 40 MG/0.6ML PO SUSP: 80.0000 mg | Freq: Four times a day (QID) | ORAL | 0 refills | Status: DC

## 2023-06-25 MED ORDER — PROSOURCE PLUS PO LIQD: 30.0000 mL | Freq: Two times a day (BID) | ORAL | 0 refills | Status: DC

## 2023-06-25 NOTE — Progress Notes (Signed)
Patient ID: Bradley [name], male   DOB: May 21, 1951, 72 y.o.   MRN: [id number] [facility] Progress Note  11 Days Post-Op  Subjective: No acute changes. He is worried about getting home to his apartment. He thinks his appetite is a little lower than normal  Objective: Vital signs in last 24 hours: Temp:  [97.3 F (36.3 C)-98.5 F (36.9 C)] 98.1 F (36.7 C) (11/05 0508) Pulse Rate:  [60-65] 65 (11/05 0508) Resp:  [18-20] 20 (11/05 0508) BP: (115-129)/(60-71) 129/62 (11/05 0508) SpO2:  [88 %-95 %] 91 % (11/05 0508) Last BM Date : 06/24/23  Intake/Output from previous day: 11/04 0701 - 11/05 0700 In: 240 [P.O.:240] Out: 700 [Urine:400; Stool:300] Intake/Output this shift: No intake/output data recorded.  PE: Gen:  Alert, NAD, oriented to self, situation, year Abd- protuberant but soft, appropriately tender, Stoma not visualized due to large amount soft stool. Gauze still with discolored drainage but no active drainage from incision itself. No erythema or induration. No fluid expressed with gentle probing and fascia appears intact.   Lab Results:  Recent Labs    06/24/23 0513 06/25/23 0505  WBC 9.7 10.7*  HGB 11.7* 11.8*  HCT 38.2* 37.6*  PLT 213 206    BMET Recent Labs    06/24/23 0513 06/25/23 0505  NA 141 141  K 4.1 4.1  CL 102 104  CO2 27 26  GLUCOSE 113* 109*  BUN 62* 58*  CREATININE 1.45* 1.20  CALCIUM 9.1 8.9   PT/INR No results for input(s): "LABPROT", "INR" in the last 72 hours.  CMP     Component Value Date/Time   NA 141 06/25/2023 0505   NA 139 06/17/2013 1114   K 4.1 06/25/2023 0505   K 4.2 06/17/2013 1114   CL 104 06/25/2023 0505   CO2 26 06/25/2023 0505   CO2 21 (L) 06/17/2013 1114   GLUCOSE 109 (H) 06/25/2023 0505   GLUCOSE 126 06/17/2013 1114   BUN 58 (H) 06/25/2023 0505   BUN 12.9 06/17/2013 1114   CREATININE 1.20 06/25/2023 0505   CREATININE 1.1 06/17/2013 1114   CALCIUM 8.9 06/25/2023 0505   CALCIUM 9.6  06/17/2013 1114   PROT 7.6 06/21/2023 1309   PROT 8.8 (H) 06/17/2013 1114   ALBUMIN 2.8 (L) 06/21/2023 1309   ALBUMIN 3.6 06/17/2013 1114   AST 12 (L) 06/21/2023 1309   AST 7 06/17/2013 1114   ALT 21 06/21/2023 1309   ALT 8 06/17/2013 1114   ALKPHOS 53 06/21/2023 1309   ALKPHOS 53 06/17/2013 1114   BILITOT 0.8 06/21/2023 1309   BILITOT 0.23 06/17/2013 1114   GFRNONAA >60 06/25/2023 0505   GFRAA >60 03/28/2020 1525   Lipase     Component Value Date/Time   LIPASE 24 06/11/2023 1023       Studies/Results: DG CHEST PORT 1 VIEW  Result Date: 06/24/2023 CLINICAL DATA:  Shortness of breath. EXAM: PORTABLE CHEST 1 VIEW COMPARISON:  June 20, 2023. FINDINGS: Stable cardiomediastinal silhouette. Stable elevated right hemidiaphragm. Minimal left basilar subsegmental atelectasis or infiltrate is noted with small pleural effusion. Bony thorax is unremarkable. IMPRESSION: Minimal left basilar subsegmental atelectasis or infiltrate is noted with small left pleural effusion. Electronically Signed   By: Lupita Raider M.D.   On: 06/24/2023 16:48    Anti-infectives: Anti-infectives (From admission, onward)    Start     Dose/Rate Route Frequency Ordered Stop   06/15/23 0200  cefoTEtan (CEFOTAN) 2 g in sodium chloride 0.9 % 100 mL IVPB  2 g 200 mL/hr over 30 Minutes Intravenous Every 12 hours 06/14/23 2009 06/15/23 0323   06/14/23 2200  cefoTEtan (CEFOTAN) 2 g in sodium chloride 0.9 % 100 mL IVPB  Status:  Discontinued        2 g 200 mL/hr over 30 Minutes Intravenous Every 12 hours 06/14/23 2005 06/14/23 2009   06/14/23 0845  cefoTEtan (CEFOTAN) 2 g in sodium chloride 0.9 % 100 mL IVPB        2 g 200 mL/hr over 30 Minutes Intravenous On call to O.R. 06/14/23 0745 06/14/23 1506        Assessment/Plan POD 11 s/p lap converted to open Hartmann's for Sigmoid/descending colon stricture by Dr. Michaell Cowing 10/25 -tolerating carb mod -needs to mobilize.  PT rec SNF -glucerna  -continue  routine colostomy care.  WOC seeing for teaching.  Patient does't have a good comprehension of his colostomy.  Will ned Short-term SNF to help his get used to this and manage this prior to DC home given he lives by himself. -prealbumin 9 -good ostomy output  - WOUND CARE - wick midline opening with wet to dry gauze and cover with gauze. May have had superficial fluid collection that has now expressed  -path with inflammation. Neg for malignancy   FEN - carb mod, glucerna VTE - lovenox ID - none Dispo - SNF.  Surgically stable when bed available   AKI - improving HTN HLD DM Bipolar H/o prostate cancer s/p radiotherapy (2021)     LOS: 14 days   Eric Form, [facility] 06/25/2023, 7:45 AM Please see Amion for pager number during day hours 7:00am-4:30pm

## 2023-06-25 NOTE — Plan of Care (Signed)
  Problem: Metabolic: Goal: Ability to maintain appropriate glucose levels will improve Outcome: Progressing   Problem: Skin Integrity: Goal: Risk for impaired skin integrity will decrease Outcome: Progressing   Problem: Pain Management: Goal: General experience of comfort will improve Outcome: Progressing

## 2023-06-25 NOTE — Discharge Summary (Signed)
Physician Discharge Summary   Patient: Bradley [name] MRN: [id number] DOB: [birth year]  Admit date:     06/11/2023  Discharge date: 06/25/23  Discharge Physician: Lynden Oxford  PCP: Medicine, [facility]  Recommendations at discharge: Follow up with PCP in 1 week Repeat BMP in 1 week WOUND CARE - wick midline opening with wet to dry gauze and cover with gauze. May have had superficial fluid collection that has now expressed    Contact information for follow-up providers     Karie Soda, MD Follow up on 07/10/2023.   Specialties: General Surgery, Colon and Rectal Surgery Why: arrive by 11:15am for 11:45am aapointment, Please bring your insurance card and photo ID Contact information: [address] [place] [address] [phone]         Medicine, [facility]. Schedule an appointment as soon as possible for a visit in 1 week(s).   Specialty: Family Medicine Why: with BMP lab to look at kidney and electrolytes Contact information: [address] [place] [address] [phone]              Contact information for after-discharge care     Destination     [facility] Preferred SNF .   Service: Skilled Nursing Contact information: [address] [place] [address] [phone]                    Discharge Diagnoses: Principal Problem:   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 Active Problems:   Hyperlipemia   Essential hypertension   Bipolar I disorder (HCC)   Malignant neoplasm of prostate (HCC)   Diabetes mellitus, type II (HCC)   Abnormal CT scan   Stricture of descending colon (HCC)   Schizoaffective disorder, bipolar type (HCC)   Vitamin D deficiency   Severe obesity (BMI 35.0-35.9 with comorbidity) (HCC)   Colostomy in place [facility])  Brief hospital course: Bradley [name] is an 72 y.o. male past medical history  significant for essential hypertension hyperlipidemia type 2 diabetes mellitus, bipolar disorder, chronic diverticular abscess comes in for constipation, presents to the [facility] with constipation incidentally found an acute kidney injury admitted by family medicine teaching service.  CT scan shows small diffuse large bowel dilation in the descending colon suspicious for colonic mass. 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  unfortunately he decompensated surgery was consulted, who performed laparoscopic surgery was done with left colectomy performed on 06/14/2023 transferred to PCCM postop remained intubated eventually extubated on 06/15/2023.  He then developed an ileus which is now resolved.   Patient appears to have ongoing encephalopathy likely in the setting of uremia. Mentation significantly better on 11/2.  Will request TOC to initiate authorization again.  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 Currently with a colostomy. Having good ostomy output. Out of bed to chair, encourage incentive spirometry. The patient is not realizing after being informed several times that he has a colostomy that he has to empty. Currently arranged to go to SNF. There is discolored drainage on his midline gauze.  General surgery recommended 2-week midline opening with wet-to-dry gauze.   Acute respiratory failure with hypoxia/postop ventilator management: S/p extubation on 06/15/2023 by PCCM. Appears to have bilateral expiratory wheezing. Chest x-ray shows small effusion and atelectasis.  Continue as needed nebulizer. Weaned to room air.   Postop pain: Transition to oral Tylenol which has been controlling his pain.   Acute kidney injury: With a baseline creatinine of 1.2-1.4 earlier  this year.  Peaked at 3.  Treated with IV fluid,.  Worsening again.  Etiology not clear likely from poor p.o. intake.   Treated with IV albumin and lasix.  Will provide MWF Lasix.  Acute metabolic encephalopathy. Likely in the setting of uremia. BUN significantly elevated in the setting of AKI. No focal deficits.  No asterixis.  Mentation improving significantly on 11/2.   Essential hypertension: Blood pressure normal  holding oral antihypertensive regimen.   Hyperkalemia: Resolved with correction of acidosis.   Mild thrombocytopenia: Likely in response to stress event. Resolved    Hyperlipemia Resume statins as an outpatient.   Schizoaffective disorder, bipolar type (HCC): Resume risperidone at a lower dose   Malignant neoplasm of prostate (HCC) Noted.   Diabetes mellitus, type II (HCC) A1c of 6.5, he is on no oral hypoglycemic agents or insulin's at home. Right now his blood sugars controlled continue sliding scale insulin.  Obesity Class 1 Body mass index is 36.64 kg/m.  Placing the pt at higher risk of poor outcomes.  Consultants:  General surgery  Colostomy RN PCCM  Gastroenterology   Procedures performed:  Flexible sigmoidoscopy   HARTMANN PROCEDURE (COLECTOMY WITH END COLECTOMY) MOBILIZATION OF SPLENIC FLEXURE OF COLON DIAGNOSTIC LAPAROSCOPY TRANSVERSUS ABDOMINIS PLANE (TAP) BLOCK - BILATERAL  DISCHARGE MEDICATION: Allergies as of 06/25/2023   No Known Allergies      Medication List     STOP taking these medications    amLODipine 10 MG tablet Commonly known as: NORVASC   hydrochlorothiazide 50 MG tablet Commonly known as: HYDRODIURIL   lisinopril 40 MG tablet Commonly known as: ZESTRIL   nebivolol 10 MG tablet Commonly known as: Bystolic       TAKE these medications    (feeding supplement) PROSource Plus liquid Take 30 mLs by mouth 2 (two) times daily between meals.   feeding supplement (GLUCERNA SHAKE) Liqd Take 237 mLs by  mouth 3 (three) times daily between meals.   acetaminophen 500 MG tablet Commonly known as: TYLENOL Take 2 tablets (1,000 mg total) by mouth every 8 (eight) hours as needed for mild pain (pain score 1-3), moderate pain (pain score 4-6), fever or headache.   divalproex 500 MG 24 hr tablet Commonly known as: DEPAKOTE ER Take 500 mg by mouth 2 (two) times daily.   furosemide 20 MG tablet Commonly known as: Lasix Take 1 tablet (20 mg total) by mouth every Monday, Wednesday, and Friday. Start taking on: June 26, 2023   lactulose 10 GM/15ML solution Commonly known as: CHRONULAC Take 15 mLs (10 g total) by mouth daily. Start taking on: June 26, 2023   multivitamin with minerals Tabs tablet Take 1 tablet by mouth daily. Start taking on: June 26, 2023   pantoprazole 40 MG tablet Commonly known as: PROTONIX Take 1 tablet (40 mg total) by mouth at bedtime for 14 days.   pravastatin 40 MG tablet Commonly known as: PRAVACHOL Take 1 tablet (40 mg total) by mouth every evening.   risperiDONE 0.25 MG tablet Commonly known as: RISPERDAL Take 1 tablet (0.25 mg total) by mouth at bedtime. What changed:  medication strength how much to take   simethicone 40 MG/0.6ML drops Commonly known as: MYLICON Take 1.2 mLs (80 mg total) by mouth 4 (four) times daily.  Disposition: SNF Diet recommendation: Carb modified diet  Discharge Exam: Vitals:   06/24/23 1815 06/24/23 1822 06/24/23 2006 06/25/23 0508  BP:   115/71 129/62  Pulse:   63 65  Resp:   20 20  Temp:   98.5 F (36.9 C) 98.1 F (36.7 C)  TempSrc:   Oral Oral  SpO2: (!) 88% 95% 93% 91%  Weight:      Height:       General: Appear in no distress; no visible Abnormal Neck Mass Or lumps, Conjunctiva normal Cardiovascular: S1 and S2 Present, no Murmur, Respiratory: good respiratory effort, Bilateral Air entry present and CTA, no Crackles, no wheezes Abdomen: Bowel Sound present, Non tender  Extremities:  bilateral Pedal edema Neurology: alert and oriented to time, place, and person  Filed Weights   06/13/23 0900 06/14/23 1015 06/14/23 1931  Weight: 109.8 kg 108.8 kg 109.3 kg   Condition at discharge: stable  The results of significant diagnostics from this hospitalization (including imaging, microbiology, ancillary and laboratory) are listed below for reference.   Imaging Studies: DG CHEST PORT 1 VIEW  Result Date: 06/24/2023 CLINICAL DATA:  Shortness of breath. EXAM: PORTABLE CHEST 1 VIEW COMPARISON:  June 20, 2023. FINDINGS: Stable cardiomediastinal silhouette. Stable elevated right hemidiaphragm. Minimal left basilar subsegmental atelectasis or infiltrate is noted with small pleural effusion. Bony thorax is unremarkable. IMPRESSION: Minimal left basilar subsegmental atelectasis or infiltrate is noted with small left pleural effusion. Electronically Signed   By: Lupita Raider M.D.   On: 06/24/2023 16:48   DG CHEST PORT 1 VIEW  Result Date: 06/20/2023 CLINICAL DATA:  Shortness of breath EXAM: PORTABLE CHEST 1 VIEW COMPARISON:  06/16/2023 FINDINGS: Enteric tube has been removed in the interval. Shallow inspiration. Heart size is somewhat obscured due to shallow inspiration but appears mildly enlarged. Atelectasis or infiltration in the lung bases with probable small pleural effusions. No pneumothorax. Mediastinal contours appear intact. Degenerative changes in the spine. IMPRESSION: Shallow inspiration with probable small effusions and atelectasis or infiltration in the bases. Electronically Signed   By: Burman Nieves M.D.   On: 06/20/2023 16:01   DG CHEST PORT 1 VIEW  Result Date: 06/16/2023 CLINICAL DATA:  Endotracheal tube EXAM: PORTABLE CHEST 1 VIEW COMPARISON:  06/12/2023 FINDINGS: No endotracheal tube is visualized. Subdiaphragmatic enteric tube. Stable cardiomegaly. Low lung volumes. Bibasilar atelectasis. Chronic blunting of the costophrenic angles. No pneumothorax. IMPRESSION:  No endotracheal tube is visualized.  Subdiaphragmatic enteric tube. Electronically Signed   By: Minerva Fester M.D.   On: 06/16/2023 03:25   DG Chest 1 View  Result Date: 06/12/2023 CLINICAL DATA:  72 year old male with hypoxia. Obstructing large bowel mass suspected on recent CT Abdomen and Pelvis . EXAM: CHEST  1 VIEW COMPARISON:  CT Abdomen and Pelvis yesterday, and earlier. FINDINGS: Portable AP semi upright view at 0501 hours. Somewhat low lung volumes stable from the CT yesterday. Heart size and mediastinal contours remain within normal limits. No pneumothorax or pneumoperitoneum identified. Chronic blunting of the left lateral costophrenic angle. No pulmonary edema or acute pulmonary opacity. No acute osseous abnormality identified. IMPRESSION: Low lung volumes, otherwise no acute cardiopulmonary abnormality. Electronically Signed   By: Odessa Fleming M.D.   On: 06/12/2023 07:25   CT ABDOMEN PELVIS WO CONTRAST  Result Date: 06/11/2023 CLINICAL DATA:  Several day history of diffuse abdominal pain and constipation. * Tracking Code: BO * EXAM: CT ABDOMEN AND PELVIS WITHOUT CONTRAST TECHNIQUE: Multidetector CT imaging of the abdomen and pelvis was performed  following the standard protocol without IV contrast. RADIATION DOSE REDUCTION: This exam was performed according to the departmental dose-optimization program which includes automated exposure control, adjustment of the mA and/or kV according to patient size and/or use of iterative reconstruction technique. COMPARISON:  Same-day abdominal radiograph, CT abdomen and pelvis dated 12/27/2016, MR abdomen dated 03/28/2021 FINDINGS: Lower chest: Left lower lobe nodule measures 5 x 2 mm (5:13), unchanged. No specific follow-up imaging recommended. No pleural effusion or pneumothorax demonstrated. Left atrial enlargement. Coronary artery calcifications. Hepatobiliary: No focal hepatic lesions. No intra or extrahepatic biliary ductal dilation. Normal gallbladder.  Pancreas: No focal lesions or main ductal dilation. Spleen: Normal in size without focal abnormality. Adrenals/Urinary Tract: No adrenal nodules. No calculi or hydronephrosis. Multifocal bilateral exophytic lesions, many of which appear mildly hyperattenuating, correspond to simple and minimally complex cysts on prior MRI. No specific follow-up imaging recommended. No focal bladder wall thickening. Stomach/Bowel: Normal appearance of the stomach. Diffuse small and large bowel dilation to the level of the distal descending colon, where there is a lobulated intraluminal density (3:47). Extensive colonic diverticulosis. Normal appendix. Vascular/Lymphatic: Aortic atherosclerosis. Bilateral common iliac arteries measure 1.9 cm. No enlarged abdominal or pelvic lymph nodes. Reproductive: Enlargement of the prostate with median lobe hypertrophy. Prostate fiducials in-situ. Other: No free fluid, fluid collection, or free air. Musculoskeletal: No acute or abnormal lytic or blastic osseous lesions. IMPRESSION: 1. Diffuse small and large bowel dilation to the level of the distal descending colon, where there is a lobulated intraluminal density, suspicious for colonic mass. Recommend colonoscopy for further evaluation. 2. Extensive colonic diverticulosis without evidence of acute diverticulitis. 3. Bilateral common iliac arteries measure 1.9 cm. 4. Aortic Atherosclerosis (ICD10-I70.0). Coronary artery calcifications. Assessment for potential risk factor modification, dietary therapy or pharmacologic therapy may be warranted, if clinically indicated. Electronically Signed   By: Agustin Cree M.D.   On: 06/11/2023 18:23   DG Abdomen 1 View  Result Date: 06/11/2023 CLINICAL DATA:  Constipation for 8 days. EXAM: ABDOMEN - 1 VIEW COMPARISON:  X-ray 08/30/2022 FINDINGS: Transverse colon is mildly dilated up to 8.7 cm. There also some dilated loops of small bowel throughout the midabdomen measuring up to 5.3 cm. Minimal air in the  rectum. Mild stool. No obvious free air but these are supine radiographs. Degenerative changes of the spine and pelvis. Treatment changes in the area of the prostate. IMPRESSION: Dilated loops of small and large bowel. This has has a differential including ileus or developing obstruction. With findings would recommend further workup with a contrast CT scan of the abdomen and pelvis when clinically appropriate Electronically Signed   By: Karen Kays M.D.   On: 06/11/2023 13:31    Microbiology: Results for orders placed or performed during the hospital encounter of 10/13/14  Culture, routine-abscess     Status: None   Collection Time: 10/14/14  5:19 PM   Specimen: Abscess  Result Value Ref Range Status   Specimen Description ABSCESS ABDOMEN LEFT  Final   Special Requests Normal  Final   Gram Stain   Final    ABUNDANT WBC PRESENT,BOTH PMN AND MONONUCLEAR NO SQUAMOUS EPITHELIAL CELLS SEEN MODERATE GRAM NEGATIVE RODS MODERATE GRAM POSITIVE RODS FEW GRAM POSITIVE COCCI    Culture   Final    MODERATE ESCHERICHIA COLI Performed at [facility]    Report Status 10/18/2014 FINAL  Final   Organism ID, Bacteria ESCHERICHIA COLI  Final      Susceptibility   Escherichia coli - MIC*  AMPICILLIN >=32 RESISTANT Resistant     AMPICILLIN/SULBACTAM 16 INTERMEDIATE Intermediate     CEFAZOLIN <=4 SENSITIVE Sensitive     CEFEPIME <=1 SENSITIVE Sensitive     CEFTAZIDIME <=1 SENSITIVE Sensitive     CEFTRIAXONE <=1 SENSITIVE Sensitive     CIPROFLOXACIN <=0.25 SENSITIVE Sensitive     GENTAMICIN >=16 RESISTANT Resistant     IMIPENEM <=0.25 SENSITIVE Sensitive     PIP/TAZO <=4 SENSITIVE Sensitive     TOBRAMYCIN 2 SENSITIVE Sensitive     TRIMETH/SULFA >=320 RESISTANT Resistant     * MODERATE ESCHERICHIA COLI   Labs: CBC: Recent Labs  Lab 06/20/23 1451 06/21/23 1012 06/23/23 0634 06/24/23 0513 06/25/23 0505  WBC 9.3 9.7 11.5* 9.7 10.7*  NEUTROABS  --  5.8  --   --   --   HGB 12.7* 12.9*  11.6* 11.7* 11.8*  HCT 40.6 40.4 37.6* 38.2* 37.6*  MCV 93.8 94.2 95.7 97.2 96.9  PLT 168 173 197 213 206   Basic Metabolic Panel: Recent Labs  Lab 06/21/23 1309 06/22/23 0921 06/23/23 0634 06/24/23 0513 06/25/23 0505  NA 142 141 140 141 141  K 3.9 4.0 3.9 4.1 4.1  CL 104 104 104 102 104  CO2 27 27 27 27 26   GLUCOSE 150* 124* 103* 113* 109*  BUN 58* 55* 60* 62* 58*  CREATININE 1.25* 1.30* 1.47* 1.45* 1.20  CALCIUM 8.8* 8.9 8.5* 9.1 8.9  MG 2.6* 2.5*  --   --   --    Liver Function Tests: Recent Labs  Lab 06/21/23 1309  AST 12*  ALT 21  ALKPHOS 53  BILITOT 0.8  PROT 7.6  ALBUMIN 2.8*   CBG: Recent Labs  Lab 06/24/23 1100 06/24/23 1506 06/24/23 1717 06/24/23 2127 06/25/23 0718  GLUCAP 122* 129* 138* 109* 109*    Discharge time spent: greater than 30 minutes.  Author: Lynden Oxford, MD  [facility]

## 2023-06-25 NOTE — Progress Notes (Signed)
Report is given to [facility] in [place] SNF.

## 2023-06-25 NOTE — TOC Transition Note (Signed)
Transition of Care [facility]) - CM/SW Discharge Note   Patient Details  Name: Bradley [name] MRN: [id number] Date of Birth: 1951-02-22  [facility]) CM/SW Contact:  Otelia Santee, LCSW Phone Number: 06/25/2023, 12:01 PM   Clinical Narrative:    Pt's insurance auth approved following peer to peer completed by MD.  Pt is able to transfer to [facility] for ST SNF. Pt will be going to room 205. RN to call report to [phone]. Pt and daughter notified of discharge plans. Pt will transfer to facility via [facility]. [facility] called at 12:38pm for transportation.    Final next level of care: Skilled Nursing Facility Barriers to Discharge: Barriers Resolved   Patient Goals and CMS Choice CMS Medicare.gov Compare Post Acute Care list provided to:: Patient Represenative (must comment) (Daughter) Choice offered to / list presented to : Adult Children  Discharge Placement PASRR number recieved: 06/19/23 PASRR number recieved: 06/19/23            Patient [facility] bed at: [facility] Patient to be transferred to facility by: [facility] Name of family member notified: Pt and daughter Patient and family notified of of transfer: 06/25/23  Discharge Plan and Services Additional resources added to the After Visit Summary for   In-house Referral: Clinical Social Work Discharge Planning Services: NA Post Acute Care Choice: Skilled Nursing Facility          DME Arranged: N/A DME Agency: NA                  Social Determinants of Health (SDOH) Interventions SDOH Screenings   Food Insecurity: No Food Insecurity (06/11/2023)  Housing: Low Risk  (06/11/2023)  Transportation Needs: No Transportation Needs (06/11/2023)  Utilities: Not At Risk (06/11/2023)  Financial Resource Strain: Not on File (12/07/2021)   Received from [place], [place]  Physical Activity: Not on File (12/07/2021)   Received from [place], [place]  Social Connections: Not on File (05/02/2023)   Received from  [facility]  Stress: Not on File (12/07/2021)   Received from [place], [place]  Tobacco Use: High Risk (06/14/2023)     Readmission Risk Interventions    06/25/2023   12:00 PM  Readmission Risk Prevention Plan  Transportation Screening Complete  PCP or Specialist Appt within 3-5 Days Complete  HRI or Home Care Consult Complete  Social Work Consult for Recovery Care Planning/Counseling Complete  Palliative Care Screening Not Applicable  Medication Review [employer]) Complete

## 2023-07-10 DIAGNOSIS — Z860101 Personal history of adenomatous and serrated colon polyps: Secondary | ICD-10-CM | POA: Insufficient documentation

## 2023-07-29 ENCOUNTER — Ambulatory Visit (HOSPITAL_COMMUNITY): Payer: Medicare HMO | Admitting: Nurse Practitioner

## 2023-08-02 ENCOUNTER — Ambulatory Visit (HOSPITAL_COMMUNITY)
Admission: RE | Admit: 2023-08-02 | Discharge: 2023-08-02 | Disposition: A | Discharge status: Home / Self Care | Payer: Medicare HMO | Source: Ambulatory Visit | Attending: *Deleted | Admitting: *Deleted

## 2023-08-02 DIAGNOSIS — Z433 Encounter for attention to colostomy: Secondary | ICD-10-CM

## 2023-08-02 DIAGNOSIS — L24B3 Irritant contact dermatitis related to fecal or urinary stoma or fistula: Secondary | ICD-10-CM | POA: Diagnosis not present

## 2023-08-02 DIAGNOSIS — Z933 Colostomy status: Secondary | ICD-10-CM | POA: Diagnosis present

## 2023-08-02 NOTE — Progress Notes (Signed)
[facility]   Reason for visit:  LUQ colostomy  patient has discharged from rehab and is doing "ok" with pouching.  Would like assistance with self care.   HPI:  Diverticulitis with Hartmann's colostomy Past Medical History:  Diagnosis Date  . Arthritis   . Bipolar 2 disorder (HCC) 2005   Also carries diagnosis of schizophrenia.  . Colon adenomas 2009, 2015  . Colonic diverticular abscess   . Diabetes mellitus without complication (HCC)   . Hyperlipemia   . Hypertension   . Hypogonadism male    Erectile dysfunction  . Lower GI bleed 07/2015  . Otitis media with effusion, left 02/01/2023  . 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     Family History  Problem Relation Age of Onset  . Cancer Brother        abdominal CA  . Colon cancer Neg Hx   . Esophageal cancer Neg Hx   . Rectal cancer Neg Hx   . Stomach cancer Neg Hx    No Known Allergies Current Outpatient Medications  Medication Sig Dispense Refill Last Dose/Taking  . acetaminophen (TYLENOL) 500 MG tablet Take 2 tablets (1,000 mg total) by mouth every 8 (eight) hours as needed for mild pain (pain score 1-3), moderate pain (pain score 4-6), fever or headache.     . divalproex (DEPAKOTE ER) 500 MG 24 hr tablet Take 500 mg by mouth 2 (two) times daily.     . feeding supplement, GLUCERNA SHAKE, (GLUCERNA SHAKE) LIQD Take 237 mLs by mouth 3 (three) times daily between meals. 10000 mL 0   . furosemide (LASIX) 20 MG tablet Take 1 tablet (20 mg total) by mouth every Monday, Wednesday, and Friday. 30 tablet 0   . lactulose (CHRONULAC) 10 GM/15ML solution Take 15 mLs (10 g total) by mouth daily. 236 mL 0   . Multiple Vitamin (MULTIVITAMIN WITH MINERALS) TABS tablet Take 1 tablet by mouth daily. 120 tablet 0   . Nutritional Supplements (,FEEDING SUPPLEMENT, PROSOURCE PLUS) liquid Take 30 mLs by mouth 2 (two) times daily between meals. 887 mL 0   . pantoprazole (PROTONIX) 40 MG  tablet Take 1 tablet (40 mg total) by mouth at bedtime for 14 days. 14 tablet 0   . pravastatin (PRAVACHOL) 40 MG tablet Take 1 tablet (40 mg total) by mouth every evening. 90 tablet 3   . risperiDONE (RISPERDAL) 0.25 MG tablet Take 1 tablet (0.25 mg total) by mouth at bedtime. 30 tablet 0   . simethicone (MYLICON) 40 MG/0.6ML drops Take 1.2 mLs (80 mg total) by mouth 4 (four) times daily. 30 mL 0    No current facility-administered medications for this encounter.   ROS  Review of Systems  Gastrointestinal:        LLQ colostomy  Skin:  Positive for color change.       Irritation around stoma  Vital signs:  BP (!) 151/89 (BP Location: Right Arm)   Pulse 65   Temp (!) 97.4 F (36.3 C) (Oral)   Resp 20   SpO2 93%  Exam:  Physical Exam Vitals reviewed.  Constitutional:      Appearance: Normal appearance.  Cardiovascular:     Rate and Rhythm: Normal rate and regular rhythm.  Abdominal:     Palpations: Abdomen is soft.  Skin:    General: Skin is warm.  Neurological:     Mental Status: He is alert and oriented to person, place, and time.  Psychiatric:        Mood and Affect: Mood normal.        Behavior: Behavior normal.    Stoma type/location:  LUQ colostomy Stomal assessment/size:  1 1/2" pink and moist  Peristomal assessment:  intact Treatment options for stomal/peristomal skin: barrier ring and 1piece convex pouch  Using coloplast  Output: soft brown stool Ostomy pouching: 1pc.convex Education provided:  We perform pouch change.  I recommend placing the barrier ring on the skin around the stoma, not on the barrier opening.  This will allow for the barrier opening to be cut larger and no skin will be exposed to stool.     Impression/dx  Colostomy Contact dermatitis Discussion  See back as needed Plan  Will enroll with [facility]    Visit time:55  minutes.   Mike Gip FNP-BC

## 2023-08-02 NOTE — Discharge Instructions (Signed)
THe coloplast filtered 1 piece convex is working well.  With barrier ring 1 1/2" stoma I will set up with [facility] (for supplies after [facility] discharge)  [phone]

## 2023-08-07 ENCOUNTER — Other Ambulatory Visit (HOSPITAL_COMMUNITY): Payer: Self-pay | Admitting: Nurse Practitioner

## 2023-08-07 DIAGNOSIS — L24B3 Irritant contact dermatitis related to fecal or urinary stoma or fistula: Secondary | ICD-10-CM

## 2023-08-07 DIAGNOSIS — Z433 Encounter for attention to colostomy: Secondary | ICD-10-CM

## 2023-08-23 ENCOUNTER — Ambulatory Visit (HOSPITAL_COMMUNITY): Payer: Medicare HMO | Admitting: Nurse Practitioner

## 2023-10-11 ENCOUNTER — Ambulatory Visit: Payer: Self-pay | Admitting: Surgery

## 2023-10-11 ENCOUNTER — Telehealth: Payer: Self-pay

## 2023-10-11 DIAGNOSIS — R739 Hyperglycemia, unspecified: Secondary | ICD-10-CM

## 2023-10-11 NOTE — Telephone Encounter (Signed)
 Spoke with [facility] and pt is scheduled for an OV with Marchelle Folks where he will get instructions and prep for procedure. Colon scheduled for 10/23/23 at [facility].

## 2023-10-11 NOTE — Telephone Encounter (Signed)
 Inbound call from [facility] requesting to speak further regarding patient's procedure. Call back number is [phone]. Please advise, thank you.

## 2023-10-11 NOTE — Progress Notes (Signed)
 Sent message, via epic in basket, requesting orders in epic from Careers adviser.

## 2023-10-11 NOTE — Telephone Encounter (Signed)
 [facility] called, pt having ostomy take down surgery on 3/6, they are requesting colon on 3/5. Pt scheduled for an OV with Quentin Mulling PA on 10/16/23 at 9am to discuss colon/get prep instructions. Amb ref in epic for colon. [facility] to notify pt of appt.

## 2023-10-15 ENCOUNTER — Encounter (HOSPITAL_COMMUNITY): Payer: Self-pay

## 2023-10-15 NOTE — Progress Notes (Signed)
 PCP - [facility] Dr. Lequita Halt Cardiologist - Laurance Flatten LOV 02-25-23 epic  PPM/ICD -  Device Orders -  Rep Notified -   Chest x-ray - 06-24-23 epic EKG - 06-11-23 epic Stress Test -  ECHO -  Cardiac Cath -   Sleep Study -  CPAP -   Fasting Blood Sugar - 120 Checks Blood Sugar __QOD___ times a day  Blood Thinner Instructions: Aspirin Instructions:  ERAS Protcol - PRE-SURGERY G2-    COVID vaccine -yes  Activity--Able to climb a flight of stairs without CP or SOB Anesthesia review: HTN, DM no meds  Patient denies shortness of breath, fever, cough and chest pain at PAT appointment   All instructions explained to the patient, with a verbal understanding of the material. Patient agrees to go over the instructions while at home for a better understanding. Patient also instructed to self quarantine after being tested for COVID-19. The opportunity to ask questions was provided.

## 2023-10-15 NOTE — Patient Instructions (Signed)
 SURGICAL WAITING ROOM VISITATION  Patients having surgery or a procedure may have no more than 2 support people in the waiting area - these visitors may rotate.    Children under the age of 29 must have an adult with them who is not the patient.  Due to an increase in RSV and influenza rates and associated hospitalizations, children ages 60 and under may not visit patients in [facility] hospitals.  Visitors with respiratory illnesses are discouraged from visiting and should remain at home.  If the patient needs to stay at the hospital during part of their recovery, the visitor guidelines for inpatient rooms apply. Pre-op nurse will coordinate an appropriate time for 1 support person to accompany patient in pre-op.  This support person may not rotate.    Please refer to the [facility] website for the visitor guidelines for Inpatients (after your surgery is over and you are in a regular room).       Your procedure is scheduled on: 10-24-23   Report to [facility] Main Entrance    Report to admitting at      0515  AM   Call this number if you have problems the morning of surgery [phone]              FOLLOW BOWEL PREP PER MD OFFICE   FOLLOW A CLEAR LIQUID DIET THE DAY OF YOUR BOWEL PREP TO PREVENT DEHYDRATION            you may have   CLEAR liquids until __0430 ____ AM/ DAY OF SURGERY  then nothing by mouth  Water Non-Citrus Juices (without pulp, NO RED-Apple, White grape, White cranberry) Black Coffee (NO MILK/CREAM OR CREAMERS, sugar ok)  Clear Tea (NO MILK/CREAM OR CREAMERS, sugar ok) regular and decaf                             Plain Jell-O (NO RED)                                           Fruit ices (not with fruit pulp, NO RED)                                     Popsicles (NO RED)                                                               Sports drinks like Gatorade (NO RED)              Drink 2 /G2 drinks AT 10:00 PM the night before surgery.         The day of surgery:  Drink ONE (1) Pre-Surgery G2 at 0430 AM the morning of surgery. Drink in one sitting. Do not sip.  This drink was given to you during your hospital  pre-op appointment visit. Nothing else to drink after completing the  Pre-Surgery G2.          If you have questions, please contact your surgeon's office.   FOLLOW BOWEL PREP AND  ANY ADDITIONAL PRE OP INSTRUCTIONS YOU RECEIVED FROM YOUR SURGEON'S OFFICE!!!     Oral Hygiene is also important to reduce your risk of infection.                                    Remember - BRUSH YOUR TEETH THE MORNING OF SURGERY WITH YOUR REGULAR TOOTHPASTE  DENTURES WILL BE REMOVED PRIOR TO SURGERY PLEASE DO NOT APPLY "Poly grip" OR ADHESIVES!!!   Do NOT smoke after Midnight   Stop all vitamins and herbal supplements 7 days before surgery.   Take these medicines the morning of surgery with A SIP OF WATER: Amlodipine, tylenol if needed  DO NOT TAKE ANY ORAL DIABETIC MEDICATIONS DAY OF YOUR SURGERY  Bring CPAP mask and tubing day of surgery.                              You may not have any metal on your body including hair pins, jewelry, and body piercing             Do not wear  lotions, powders, perfumes/cologne, or deodorant              Men may shave face and neck.   Do not bring valuables to the hospital. [place] IS NOT             RESPONSIBLE   FOR VALUABLES.   Contacts, glasses, dentures or bridgework may not be worn into surgery.   Bring small overnight bag day of surgery.   DO NOT BRING YOUR HOME MEDICATIONS TO THE HOSPITAL. PHARMACY WILL DISPENSE MEDICATIONS LISTED ON YOUR MEDICATION LIST TO YOU DURING YOUR ADMISSION IN THE HOSPITAL!    Patients discharged on the day of surgery will not be allowed to drive home.  Someone NEEDS to stay with you for the first 24 hours after anesthesia.   Special Instructions: Bring a copy of your healthcare power of attorney and living will documents the day of surgery if  you haven't scanned them before.              Please read over the following fact sheets you were given: IF YOU HAVE QUESTIONS ABOUT YOUR PRE-OP INSTRUCTIONS PLEASE CALL [phone]   I If you test positive for Covid or have been in contact with anyone that has tested positive in the last 10 days please notify you surgeon.    [place] - Preparing for Surgery Before surgery, you can play an important role.  Because skin is not sterile, your skin needs to be as free of germs as possible.  You can reduce the number of germs on your skin by washing with CHG (chlorahexidine gluconate) soap before surgery.  CHG is an antiseptic cleaner which kills germs and bonds with the skin to continue killing germs even after washing. Please DO NOT use if you have an allergy to CHG or antibacterial soaps.  If your skin becomes reddened/irritated stop using the CHG and inform your nurse when you arrive at Short Stay. Do not shave (including legs and underarms) for at least 48 hours prior to the first CHG shower.  You may shave your face/neck. Please follow these instructions carefully:  1.  Shower with CHG Soap the night before surgery and the  morning of Surgery.  2.  If you choose to wash your hair,  wash your hair first as usual with your  normal  shampoo.  3.  After you shampoo, rinse your hair and body thoroughly to remove the  shampoo.                           4.  Use CHG as you would any other liquid soap.  You can apply chg directly  to the skin and wash                       Gently with a scrungie or clean washcloth.  5.  Apply the CHG Soap to your body ONLY FROM THE NECK DOWN.   Do not use on face/ open                           Wound or open sores. Avoid contact with eyes, ears mouth and genitals (private parts).                       Wash face,  Genitals (private parts) with your normal soap.             6.  Wash thoroughly, paying special attention to the area where your surgery  will be  performed.  7.  Thoroughly rinse your body with warm water from the neck down.  8.  DO NOT shower/wash with your normal soap after using and rinsing off  the CHG Soap.                9.  Pat yourself dry with a clean towel.            10.  Wear clean pajamas.            11.  Place clean sheets on your bed the night of your first shower and do not  sleep with pets. Day of Surgery : Do not apply any lotions/deodorants the morning of surgery.  Please wear clean clothes to the hospital/surgery center.  FAILURE TO FOLLOW THESE INSTRUCTIONS MAY RESULT IN THE CANCELLATION OF YOUR SURGERY PATIENT SIGNATURE_________________________________  NURSE SIGNATURE__________________________________  ________________________________________________________________________  Rogelia Mire  An incentive spirometer is a tool that can help keep your lungs clear and active. This tool measures how well you are filling your lungs with each breath. Taking long deep breaths may help reverse or decrease the chance of developing breathing (pulmonary) problems (especially infection) following: A long period of time when you are unable to move or be active. BEFORE THE PROCEDURE  If the spirometer includes an indicator to show your best effort, your nurse or respiratory therapist will set it to a desired goal. If possible, sit up straight or lean slightly forward. Try not to slouch. Hold the incentive spirometer in an upright position. INSTRUCTIONS FOR USE  Sit on the edge of your bed if possible, or sit up as far as you can in bed or on a chair. Hold the incentive spirometer in an upright position. Breathe out normally. Place the mouthpiece in your mouth and seal your lips tightly around it. Breathe in slowly and as deeply as possible, raising the piston or the ball toward the top of the column. Hold your breath for 3-5 seconds or for as long as possible. Allow the piston or ball to fall to the bottom of the  column. Remove the mouthpiece from your mouth and breathe out normally.  Rest for a few seconds and repeat Steps 1 through 7 at least 10 times every 1-2 hours when you are awake. Take your time and take a few normal breaths between deep breaths. The spirometer may include an indicator to show your best effort. Use the indicator as a goal to work toward during each repetition. After each set of 10 deep breaths, practice coughing to be sure your lungs are clear. If you have an incision (the cut made at the time of surgery), support your incision when coughing by placing a pillow or rolled up towels firmly against it. Once you are able to get out of bed, walk around indoors and cough well. You may stop using the incentive spirometer when instructed by your caregiver.  RISKS AND COMPLICATIONS Take your time so you do not get dizzy or light-headed. If you are in pain, you may need to take or ask for pain medication before doing incentive spirometry. It is harder to take a deep breath if you are having pain. AFTER USE Rest and breathe slowly and easily. It can be helpful to keep track of a log of your progress. Your caregiver can provide you with a simple table to help with this. If you are using the spirometer at home, follow these instructions: SEEK MEDICAL CARE IF:  You are having difficultly using the spirometer. You have trouble using the spirometer as often as instructed. Your pain medication is not giving enough relief while using the spirometer. You develop fever of 100.5 F (38.1 C) or higher. SEEK IMMEDIATE MEDICAL CARE IF:  You cough up bloody sputum that had not been present before. You develop fever of 102 F (38.9 C) or greater. You develop worsening pain at or near the incision site. MAKE SURE YOU:  Understand these instructions. Will watch your condition. Will get help right away if you are not doing well or get worse. Document Released: 12/17/2006 Document Revised: 10/29/2011  Document Reviewed: 02/17/2007 [facility] Patient Information 2014 [place], [place].   ________________________________________________________________________

## 2023-10-16 ENCOUNTER — Encounter: Payer: Self-pay | Admitting: Gastroenterology

## 2023-10-16 ENCOUNTER — Encounter: Payer: Self-pay | Admitting: Physician Assistant

## 2023-10-16 ENCOUNTER — Ambulatory Visit (INDEPENDENT_AMBULATORY_CARE_PROVIDER_SITE_OTHER): Payer: Medicare HMO | Admitting: Physician Assistant

## 2023-10-16 VITALS — BP 132/78 | HR 55 | Ht 68.0 in | Wt 245.2 lb

## 2023-10-16 DIAGNOSIS — Z01818 Encounter for other preprocedural examination: Secondary | ICD-10-CM | POA: Diagnosis not present

## 2023-10-16 DIAGNOSIS — R609 Edema, unspecified: Secondary | ICD-10-CM

## 2023-10-16 DIAGNOSIS — E119 Type 2 diabetes mellitus without complications: Secondary | ICD-10-CM

## 2023-10-16 DIAGNOSIS — Z934 Other artificial openings of gastrointestinal tract status: Secondary | ICD-10-CM

## 2023-10-16 DIAGNOSIS — Z433 Encounter for attention to colostomy: Secondary | ICD-10-CM

## 2023-10-16 DIAGNOSIS — D696 Thrombocytopenia, unspecified: Secondary | ICD-10-CM

## 2023-10-16 DIAGNOSIS — K56600 Partial intestinal obstruction, unspecified as to cause: Secondary | ICD-10-CM

## 2023-10-16 NOTE — Patient Instructions (Signed)
 You have been scheduled for a colonoscopy. Please follow written instructions given to you at your visit today.   If you use inhalers (even only as needed), please bring them with you on the day of your procedure.  DO NOT TAKE 7 DAYS PRIOR TO TEST- Trulicity (dulaglutide) Ozempic, Wegovy (semaglutide) Mounjaro (tirzepatide) Bydureon Bcise (exanatide extended release)  DO NOT TAKE 1 DAY PRIOR TO YOUR TEST Rybelsus (semaglutide) Adlyxin (lixisenatide) Victoza (liraglutide) Byetta (exanatide) ___________________________________________________________________________  Bonita Quin will receive your bowel preparation through [facility], which ensures the lowest copay and home delivery, with outreach via text or call from an [phone] number. Please respond promptly to avoid rescheduling of your procedure. If you are interested in alternative options or have any questions regarding your prep, please contact them at [phone] ____________________________________________________________________________  Your Provider Has Sent Your Bowel Prep Regimen To [facility]   [facility] will contact you to verify your information and collect your copay, if applicable. Enjoy the comfort of your home while your prescription is mailed to you, FREE of any shipping charges.   [facility] accepts all major insurance benefits and applies discounts & coupons.  Have additional questions?   Chat: [email or web address] Call: [phone] Email: [email or web address] NCPDP: [id number]  How will [facility] contact you?  With a Welcome phone call,  a Welcome text and a checkout link in text form.  Texts you receive from [phone] Are NOT Spam.  *To set up delivery, you must complete the checkout process via link or speak to one of the patient care representatives. If [facility] is unable to reach you, your prescription may be delayed.  To avoid long hold times on the phone, you may also utilize the secure chat  feature on the [facility] website to request that they call you back for transaction completion or to expedite your concerns.  ______________________________________________________  If your blood pressure at your visit was 140/90 or greater, please contact your primary care physician to follow up on this.  _______________________________________________________  If you are age 29 or older, your body mass index should be between 23-30. Your Body mass index is 37.28 kg/m. If this is out of the aforementioned range listed, please consider follow up with your Primary Care Provider.  If you are age 30 or younger, your body mass index should be between 19-25. Your Body mass index is 37.28 kg/m. If this is out of the aformentioned range listed, please consider follow up with your Primary Care Provider.   ________________________________________________________  The [place] [facility] providers would like to encourage you to use [facility] to communicate with providers for non-urgent requests or questions.  Due to long hold times on the telephone, sending your provider a message by [facility] may be a faster and more efficient way to get a response.  Please allow 48 business hours for a response.  Please remember that this is for non-urgent requests.  _______________________________________________________ It was a pleasure to see you today!  Thank you for trusting me with your gastrointestinal care!

## 2023-10-16 NOTE — Progress Notes (Signed)
 10/16/2023 Bradley [name] [id number] [birth year]  Referring provider: [facility], [facility] Primary GI doctor: Dr. Marina Goodell  ASSESSMENT AND PLAN:   Ostomy Takedown Scheduled for a procedure with Dr. Michaell Cowing. No current abdominal pain, nausea, vomiting, fevers, or chills. No blood in stool. -Pre-operative colonoscopy scheduled for 3pm on 10/23/2023 with Dr. Adela Lank  We have discussed the risks of bleeding, infection, perforation, medication reactions, and remote risk of death associated with colonoscopy. All questions were answered and the patient acknowledges these risk and wishes to proceed.  Edema Reports improvement with Furosemide. -Continue current regimen of Furosemide.  Diabetes Reports condition is not severe and is not on medication. -Monitor condition, no current changes to management plan.  General Health Maintenance -Review and understand risks associated with anesthesia and colonoscopy procedure. -Nurse to provide instructions for colonoscopy prep.      Patient Care Team: [facility] as PCP - General (Family Medicine) Artis Delay, MD as Consulting Physician (Hematology and Oncology) Karie Soda, MD as Consulting Physician (Colon and Rectal Surgery) Hilarie Fredrickson, MD as Consulting Physician (Gastroenterology)  HISTORY OF PRESENT ILLNESS: 73 y.o. 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   Patient was admitted to the hospital 10/23- 06/25/2023 presented with constipation and AKI.  CT scan showed small diffuse large bowel dilation in the descending colon suspicious for colonic mass. Dr. Adela Lank performed flex sig that which showed intrinsic severe stenosis  of the proximal sigmoid to the distal descending colon.   06/14/2023 surgery consulted and had laparoscopic left colectomy, remained intubated until 06/15/2023.   Complicated by ileus which resolved prior to discharge.    Patient presents today for colonoscopy through ostomy prior to scheduled robotic ostomy takedown 10/24/2023 with Dr. Michaell Cowing.   Discussed the use of AI scribe software for clinical note transcription with the patient, who gave verbal consent to proceed.  History of Present Illness   Dustin Bumbaugh is a 73 year old male who presents for a scheduled colonoscopy and ostomy takedown.  This is part of the preparation to ensure there are no issues on the right side of the colon, particularly due to a previous stricture and obstruction, likely related to diverticulosis or diverticulitis.  He feels good with no symptoms such as melena, hematochezia, nausea, vomiting, fevers, chills, abdominal pain, dyspnea, chest discomfort, dysphagia, or leg swelling. His bowel movements are soft without blood.  He has a history of diabetes, which he describes as 'not severe,' and he is not currently on any medication for it. He mentions experiencing some leg swelling, which he attributes to furosemide use, noting that it has helped reduce the swelling.  He has a surgical scar from a previous procedure, extending vertically from the umbilicus to the suprapubic area, with no current pain or discomfort associated with this area.         He  reports that he has been smoking cigarettes. He has a 20 pack-year smoking history. He has never used smokeless tobacco. He reports that he does not drink alcohol and does not use drugs.  RELEVANT GI HISTORY, LABS, IMAGING:  CBC    Component Value Date/Time   WBC 10.7 (H) 06/25/2023 0505   RBC 3.88 (L) 06/25/2023 0505   HGB 11.8 (L) 06/25/2023  0505   HGB 14.2 01/22/2014 1246   HCT 37.6 (L) 06/25/2023 0505   HCT 43.8 01/22/2014 1246   PLT 206 06/25/2023  0505   PLT 139 (L) 01/22/2014 1246   MCV 96.9 06/25/2023 0505   MCV 89.8 01/22/2014 1246   MCH 30.4 06/25/2023 0505   MCHC 31.4 06/25/2023 0505   RDW 15.3 06/25/2023 0505   RDW 15.2 (H) 01/22/2014 1246   LYMPHSABS 1.1 06/21/2023 1012   LYMPHSABS 2.2 01/22/2014 1246   MONOABS 2.3 (H) 06/21/2023 1012   MONOABS 0.8 01/22/2014 1246   EOSABS 0.1 06/21/2023 1012   EOSABS 0.1 01/22/2014 1246   BASOSABS 0.1 06/21/2023 1012   BASOSABS 0.0 01/22/2014 1246   Recent Labs    06/13/23 0839 06/14/23 0858 06/15/23 0602 06/15/23 1010 06/16/23 0302 06/20/23 1451 06/21/23 1012 06/23/23 0634 06/24/23 0513 06/25/23 0505  HGB 14.0 14.8 13.3 12.8* 13.7 12.7* 12.9* 11.6* 11.7* 11.8*    CMP     Component Value Date/Time   NA 141 06/25/2023 0505   NA 139 06/17/2013 1114   K 4.1 06/25/2023 0505   K 4.2 06/17/2013 1114   CL 104 06/25/2023 0505   CO2 26 06/25/2023 0505   CO2 21 (L) 06/17/2013 1114   GLUCOSE 109 (H) 06/25/2023 0505   GLUCOSE 126 06/17/2013 1114   BUN 58 (H) 06/25/2023 0505   BUN 12.9 06/17/2013 1114   CREATININE 1.20 06/25/2023 0505   CREATININE 1.1 06/17/2013 1114   CALCIUM 8.9 06/25/2023 0505   CALCIUM 9.6 06/17/2013 1114   PROT 7.6 06/21/2023 1309   PROT 8.8 (H) 06/17/2013 1114   ALBUMIN 2.8 (L) 06/21/2023 1309   ALBUMIN 3.6 06/17/2013 1114   AST 12 (L) 06/21/2023 1309   AST 7 06/17/2013 1114   ALT 21 06/21/2023 1309   ALT 8 06/17/2013 1114   ALKPHOS 53 06/21/2023 1309   ALKPHOS 53 06/17/2013 1114   BILITOT 0.8 06/21/2023 1309   BILITOT 0.23 06/17/2013 1114   GFRNONAA >60 06/25/2023 0505   GFRAA >60 03/28/2020 1525      Latest Ref Rng & Units 06/21/2023    1:09 PM 06/11/2023   10:23 AM 01/04/2023    9:34 AM  Hepatic Function  Total Protein 6.5 - 8.1 g/dL 7.6  8.2  7.4   Albumin 3.5 - 5.0 g/dL 2.8  3.7  3.2   AST 15 - 41 U/L 12  12  18    ALT 0 - 44 U/L 21  19  24    Alk Phosphatase 38 - 126 U/L 53  38  44   Total Bilirubin 0.3 - 1.2 mg/dL 0.8  0.8  0.6        Current Medications:    Current Outpatient Medications (Cardiovascular):    amLODipine (NORVASC) 10 MG tablet, Take 10 mg by mouth daily.   hydrochlorothiazide (HYDRODIURIL) 50 MG tablet, Take 50 mg by mouth daily.   lisinopril (ZESTRIL) 40 MG tablet, Take 40 mg by mouth daily.   pravastatin (PRAVACHOL) 40 MG tablet, Take 1 tablet (40 mg total) by mouth every evening.   furosemide (LASIX) 20 MG tablet, Take 1 tablet (20 mg total) by mouth every Monday, Wednesday, and Friday. (Patient not taking: Reported on 10/15/2023)   Current Outpatient Medications (Analgesics):    acetaminophen (TYLENOL) 500 MG tablet, Take 2 tablets (1,000 mg total) by mouth every 8 (eight) hours as needed for mild pain (pain score 1-3), moderate pain (pain score 4-6), fever or headache.   Current Outpatient Medications (  Other):    divalproex (DEPAKOTE ER) 500 MG 24 hr tablet, Take 500 mg by mouth at bedtime.   risperiDONE (RISPERDAL) 0.25 MG tablet, Take 1 tablet (0.25 mg total) by mouth at bedtime. (Patient taking differently: Take 2 mg by mouth at bedtime.)   feeding supplement, GLUCERNA SHAKE, (GLUCERNA SHAKE) LIQD, Take 237 mLs by mouth 3 (three) times daily between meals. (Patient not taking: Reported on 10/15/2023)   metroNIDAZOLE (FLAGYL) 500 MG tablet, Take by mouth. (Patient not taking: Reported on 10/16/2023)   Nutritional Supplements (,FEEDING SUPPLEMENT, PROSOURCE PLUS) liquid, Take 30 mLs by mouth 2 (two) times daily between meals. (Patient not taking: Reported on 10/15/2023)  Medical History:  Past Medical History:  Diagnosis Date   Arthritis    Bipolar 2 disorder (HCC) 2005   Also carries diagnosis of schizophrenia.   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     Allergies: No Known Allergies   Surgical History:  He  has a past surgical history that includes Colonoscopy (2009, 2015); enteroscopy (N/A, 07/28/2015); Flexible sigmoidoscopy (N/A, 06/13/2023); Submucosal tattoo injection (06/13/2023); and Colon resection (N/A, 06/14/2023). Family History:  His family history includes Cancer in his brother.  REVIEW OF SYSTEMS  : All other systems reviewed and negative except where noted in the History of Present Illness.  PHYSICAL EXAM: BP 132/78 (BP Location: Left Arm, Patient Position: Sitting, Cuff Size: Large)   Pulse (!) 55   Ht 5\' 8"  (1.727 m)   Wt 245 lb 3.2 oz (111.2 kg)   BMI 37.28 kg/m  Physical Exam   GENERAL: Alert, cooperative, well developed, no acute distress. HEENT: Normocephalic, normal oropharynx, moist mucous membranes. CHEST: Clear to auscultation bilaterally, no wheezes, rhonchi, or crackles. CARDIOVASCULAR: Normal heart rate and rhythm, S1 and S2 normal without murmurs. ABDOMEN: Soft, non-tender, non-distended, without organomegaly, normal bowel sounds. Left upper ostomy with brown soft stools, no erythema, or hernia. Vertical surgical scar from umbilicus to suprapubic region. EXTREMITIES: No cyanosis or edema. NEUROLOGICAL: Cranial nerves grossly intact, moves all extremities without gross motor or sensory deficit.      Doree Albee, PA-C 10:45 AM

## 2023-10-16 NOTE — Progress Notes (Signed)
 Noted.

## 2023-10-18 ENCOUNTER — Encounter (HOSPITAL_COMMUNITY): Payer: Self-pay

## 2023-10-18 ENCOUNTER — Encounter (HOSPITAL_COMMUNITY)
Admission: RE | Admit: 2023-10-18 | Discharge: 2023-10-18 | Disposition: A | Discharge status: Home / Self Care | Payer: Medicare HMO | Source: Ambulatory Visit | Attending: Surgery | Admitting: Surgery

## 2023-10-18 ENCOUNTER — Other Ambulatory Visit: Payer: Self-pay

## 2023-10-18 VITALS — BP 154/73 | HR 61 | Temp 97.9°F | Resp 16 | Ht 68.0 in | Wt 241.0 lb

## 2023-10-18 DIAGNOSIS — E119 Type 2 diabetes mellitus without complications: Secondary | ICD-10-CM

## 2023-10-18 DIAGNOSIS — E1165 Type 2 diabetes mellitus with hyperglycemia: Secondary | ICD-10-CM | POA: Insufficient documentation

## 2023-10-18 DIAGNOSIS — R739 Hyperglycemia, unspecified: Secondary | ICD-10-CM

## 2023-10-18 DIAGNOSIS — Z01818 Encounter for other preprocedural examination: Secondary | ICD-10-CM | POA: Diagnosis present

## 2023-10-18 DIAGNOSIS — Z01812 Encounter for preprocedural laboratory examination: Secondary | ICD-10-CM | POA: Insufficient documentation

## 2023-10-18 LAB — HEMOGLOBIN A1C
Hgb A1c MFr Bld: 5.7 % — ABNORMAL HIGH (ref 4.8–5.6)
Mean Plasma Glucose: 116.89 mg/dL

## 2023-10-18 LAB — CBC
HCT: 42.3 % (ref 39.0–52.0)
Hemoglobin: 12.4 g/dL — ABNORMAL LOW (ref 13.0–17.0)
MCH: 28.2 pg (ref 26.0–34.0)
MCHC: 29.3 g/dL — ABNORMAL LOW (ref 30.0–36.0)
MCV: 96.4 fL (ref 80.0–100.0)
Platelets: 109 10*3/uL — ABNORMAL LOW (ref 150–400)
RBC: 4.39 MIL/uL (ref 4.22–5.81)
RDW: 15.6 % — ABNORMAL HIGH (ref 11.5–15.5)
WBC: 5.9 10*3/uL (ref 4.0–10.5)
nRBC: 0 % (ref 0.0–0.2)

## 2023-10-18 LAB — BASIC METABOLIC PANEL
Anion gap: 10 (ref 5–15)
BUN: 12 mg/dL (ref 8–23)
CO2: 25 mmol/L (ref 22–32)
Calcium: 8.8 mg/dL — ABNORMAL LOW (ref 8.9–10.3)
Chloride: 101 mmol/L (ref 98–111)
Creatinine, Ser: 0.89 mg/dL (ref 0.61–1.24)
GFR, Estimated: >60 mL/min (ref >60)
Glucose, Bld: 99 mg/dL (ref 70–99)
Potassium: 3.7 mmol/L (ref 3.5–5.1)
Sodium: 136 mmol/L (ref 135–145)

## 2023-10-18 LAB — GLUCOSE, CAPILLARY: Glucose-Capillary: 103 mg/dL — ABNORMAL HIGH (ref 70–99)

## 2023-10-23 ENCOUNTER — Encounter: Payer: Medicare HMO | Admitting: Gastroenterology

## 2023-10-23 ENCOUNTER — Telehealth: Payer: Self-pay | Admitting: Gastroenterology

## 2023-10-23 NOTE — Telephone Encounter (Signed)
 Patient came in with Bradley [name] for his colon procedure today. He was made aware of our protocol of having a care partner. He stated his daughter is at work and would not be able to come. Patient asked to contact his daughter [name] [phone] to let her know about his procedure situation. Patient daughter said she would not be able to come. Patient was offered the option  to use [facility] service but he declined saying he does not have his credit card to pay. Patient's daughter is added to his DRP to be contacted. I called Dr Michaell Cowing office to let them know pt is unable to have his procedure with [place] today and the lady at his office stated, she will give the patient a call back to reschedule.

## 2023-10-24 ENCOUNTER — Encounter (HOSPITAL_COMMUNITY): Admission: RE | Payer: Self-pay | Source: Home / Self Care

## 2023-10-24 ENCOUNTER — Inpatient Hospital Stay (HOSPITAL_COMMUNITY): Admission: RE | Admit: 2023-10-24 | Payer: Medicare HMO | Source: Home / Self Care | Admitting: Surgery

## 2023-10-24 SURGERY — CLOSURE, COLOSTOMY, ROBOT-ASSISTED
Anesthesia: General

## 2023-11-06 ENCOUNTER — Telehealth: Payer: Self-pay

## 2023-11-06 ENCOUNTER — Other Ambulatory Visit: Payer: Self-pay

## 2023-11-06 MED ORDER — SUFLAVE 178.7 G PO SOLR
1.0000 | Freq: Once | ORAL | 0 refills | Status: AC
Start: 2023-11-06 — End: 2023-11-06

## 2023-11-06 NOTE — Telephone Encounter (Signed)
 Patient's surgery date for colostomy takedown has been moved to 12/26/23. Confirmed with the patient that the colonoscopy will be moved to 12/25/23 at 2:30. Also gave the patient the telephone number to [facility] to call about his prep.

## 2023-11-11 ENCOUNTER — Ambulatory Visit (HOSPITAL_COMMUNITY)
Admission: RE | Admit: 2023-11-11 | Discharge: 2023-11-11 | Disposition: A | Discharge status: Home / Self Care | Source: Ambulatory Visit | Attending: Nurse Practitioner | Admitting: Nurse Practitioner

## 2023-11-11 DIAGNOSIS — L24B3 Irritant contact dermatitis related to fecal or urinary stoma or fistula: Secondary | ICD-10-CM | POA: Insufficient documentation

## 2023-11-11 DIAGNOSIS — Z433 Encounter for attention to colostomy: Secondary | ICD-10-CM | POA: Diagnosis not present

## 2023-11-11 DIAGNOSIS — Z933 Colostomy status: Secondary | ICD-10-CM | POA: Diagnosis not present

## 2023-11-11 NOTE — Progress Notes (Signed)
 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  Reversal scheduled for next month HPI:  Diverticulitis with colostomy Past Medical History:  Diagnosis Date   Arthritis    Bipolar 2 disorder (HCC) 2005   Also carries diagnosis of schizophrenia.   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     Family History  Problem Relation Age of Onset   Cancer Brother        abdominal CA   Colon cancer Neg Hx    Esophageal cancer Neg Hx    Rectal cancer Neg Hx    Stomach cancer Neg Hx    No Known Allergies Current Outpatient Medications  Medication Sig Dispense Refill Last Dose/Taking   acetaminophen (TYLENOL) 500 MG tablet Take 2 tablets (1,000 mg total) by mouth every 8 (eight) hours as needed for mild pain (pain score 1-3), moderate pain (pain score 4-6), fever or headache.      amLODipine (NORVASC) 10 MG tablet Take 10 mg by mouth daily.      divalproex (DEPAKOTE ER) 500 MG 24 hr tablet Take 500 mg by mouth at bedtime.      feeding supplement, GLUCERNA SHAKE, (GLUCERNA SHAKE) LIQD Take 237 mLs by mouth 3 (three) times daily between meals. (Patient not taking: Reported on 10/15/2023) 10000 mL 0    furosemide (LASIX) 20 MG tablet Take 1 tablet (20 mg total) by mouth every Monday, Wednesday, and Friday. (Patient not taking: Reported on 10/15/2023) 30 tablet 0    hydrochlorothiazide (HYDRODIURIL) 50 MG tablet Take 50 mg by mouth daily.      lisinopril (ZESTRIL) 40 MG tablet Take 40 mg by mouth daily.      metroNIDAZOLE (FLAGYL) 500 MG tablet Take by mouth. (Patient not taking: Reported on 10/16/2023)      Nutritional Supplements (,FEEDING  SUPPLEMENT, PROSOURCE PLUS) liquid Take 30 mLs by mouth 2 (two) times daily between meals. (Patient not taking: Reported on 10/15/2023) 887 mL 0    pravastatin (PRAVACHOL) 40 MG tablet Take 1 tablet (40 mg total) by mouth every evening. 90 tablet 3    risperiDONE (RISPERDAL) 0.25 MG tablet Take 1 tablet (0.25 mg total) by mouth at bedtime. (Patient taking differently: Take 2 mg by mouth at bedtime.) 30 tablet 0    No current facility-administered medications for this encounter.   ROS  Review of Systems  Gastrointestinal:        LUQ colostomy Peristomal bulging  Skin:  Positive for color change (dark around stoma).  Psychiatric/Behavioral: Negative.    All other systems reviewed and are negative.  Vital signs:  BP (!) 188/87 (BP Location: Right Arm) Comment: RN Notified  Pulse 61   Temp (!) 97.3 F (36.3 C) (Oral)   Resp 18   SpO2 94%  Exam:  Physical Exam Vitals reviewed.  Constitutional:      Appearance: Normal appearance.  Cardiovascular:     Rate and Rhythm: Normal rate and regular rhythm.     Comments: BP 176/82 by conclusion of visit Abdominal:     Palpations: Abdomen is soft.     Hernia: A hernia (  peristomal bulging) is present.  Musculoskeletal:        General: Normal range of motion.  Skin:    General: Skin is dry.  Neurological:     General: No focal deficit present.     Mental Status: He is alert and oriented to person, place, and time.     Stoma type/location:  LUQ colostomy Stomal assessment/size:  1 1/2" pink and moist Peristomal assessment:  darkened, bulging from 2 to 6 o'clock Treatment options for stomal/peristomal skin: barrier ring and 1 piece convex pouch  using coloplast Output: soft brown stool Ostomy pouching: 1pc.convex Education provided:  stoma powder and skin prep with barrier ring and 1 piece convex pouch  encouraged to wear ostomy belt, fitted and provided with one.     Impression/dx  Colostomy Irritant contact dermatitis Discussion   See back as needed.  Plan  Enroll with [facility], still has not heard back.  Will re fax again    Visit time: 45 minutes.   Mike Gip FNP-BC

## 2023-11-14 ENCOUNTER — Other Ambulatory Visit (HOSPITAL_COMMUNITY): Payer: Self-pay | Admitting: Nurse Practitioner

## 2023-11-14 DIAGNOSIS — K9400 Colostomy complication, unspecified: Secondary | ICD-10-CM

## 2023-11-14 DIAGNOSIS — K4020 Bilateral inguinal hernia, without obstruction or gangrene, not specified as recurrent: Secondary | ICD-10-CM

## 2023-11-15 ENCOUNTER — Ambulatory Visit: Payer: Medicare HMO | Admitting: Nurse Practitioner

## 2023-11-18 NOTE — Discharge Instructions (Signed)
 NO changes  Will refax prescription to [facility]

## 2023-12-10 ENCOUNTER — Ambulatory Visit: Payer: Self-pay | Admitting: Surgery

## 2023-12-10 NOTE — Progress Notes (Signed)
 Surgery orders requested via Epic inbox.

## 2023-12-13 ENCOUNTER — Encounter: Admitting: Gastroenterology

## 2023-12-13 NOTE — Patient Instructions (Addendum)
 SURGICAL WAITING ROOM VISITATION  Patients having surgery or a procedure may have no more than 2 support people in the waiting area - these visitors may rotate.    Children under the age of 69 must have an adult with them who is not the patient.  Due to an increase in RSV and influenza rates and associated hospitalizations, children ages 58 and under may not visit patients in [facility] hospitals.  Visitors with respiratory illnesses are discouraged from visiting and should remain at home.  If the patient needs to stay at the hospital during part of their recovery, the visitor guidelines for inpatient rooms apply. Pre-op nurse will coordinate an appropriate time for 1 support person to accompany patient in pre-op.  This support person may not rotate.    Please refer to the [facility] website for the visitor guidelines for Inpatients (after your surgery is over and you are in a regular room).    Your procedure is scheduled on: 12/26/23   Report to [facility] Main Entrance    Report to admitting at 10:15 AM   Call this number if you have problems the morning of surgery [phone]   Follow a clear liquid diet the day before surgery.   You may have the following liquids until 9:30 AM DAY OF SURGERY  Water  Non-Citrus Juices (without pulp, NO RED-Apple, White grape, White cranberry) Black Coffee (NO MILK/CREAM OR CREAMERS, sugar ok)  Clear Tea (NO MILK/CREAM OR CREAMERS, sugar ok) regular and decaf                             Plain Jell-O (NO RED)                                           Fruit ices (not with fruit pulp, NO RED)                                     Popsicles (NO RED)                                                               Sports drinks like Gatorade (NO RED)              Drink 2 G2 drinks AT 10:00 PM the night before surgery.        The day of surgery:  Drink ONE (1) Pre-Surgery G2 at 9:30 AM the morning of surgery. Drink in one sitting. Do not sip.   This drink was given to you during your hospital  pre-op appointment visit. Nothing else to drink after completing the  Pre-Surgery G2.          If you have questions, please contact your surgeon's office.   FOLLOW BOWEL PREP AND ANY ADDITIONAL PRE OP INSTRUCTIONS YOU RECEIVED FROM YOUR SURGEON'S OFFICE!!!     Oral Hygiene is also important to reduce your risk of infection.  Remember - BRUSH YOUR TEETH THE MORNING OF SURGERY WITH YOUR REGULAR TOOTHPASTE  DENTURES WILL BE REMOVED PRIOR TO SURGERY PLEASE DO NOT APPLY "Poly grip" OR ADHESIVES!!!   Do NOT smoke after Midnight   Stop all vitamins and herbal supplements 7 days before surgery.   Take these medicines the morning of surgery with A SIP OF WATER : Inhalers   DO NOT TAKE ANY ORAL DIABETIC MEDICATIONS DAY OF YOUR SURGERY  How to Manage Your Diabetes Before and After Surgery  Why is it important to control my blood sugar before and after surgery? Improving blood sugar levels before and after surgery helps healing and can limit problems. A way of improving blood sugar control is eating a healthy diet by:  Eating less sugar and carbohydrates  Increasing activity/exercise  Talking with your doctor about reaching your blood sugar goals High blood sugars (greater than 180 mg/dL) can raise your risk of infections and slow your recovery, so you will need to focus on controlling your diabetes during the weeks before surgery. Make sure that the doctor who takes care of your diabetes knows about your planned surgery including the date and location.  How do I manage my blood sugar before surgery? Check your blood sugar at least 4 times a day, starting 2 days before surgery, to make sure that the level is not too high or low. Check your blood sugar the morning of your surgery when you wake up and every 2 hours until you get to the Short Stay unit. If your blood sugar is less than 70 mg/dL, you will  need to treat for low blood sugar: Do not take insulin . Treat a low blood sugar (less than 70 mg/dL) with  cup of clear juice (cranberry or apple), 4 glucose tablets, OR glucose gel. Recheck blood sugar in 15 minutes after treatment (to make sure it is greater than 70 mg/dL). If your blood sugar is not greater than 70 mg/dL on recheck, call [phone] for further instructions. Report your blood sugar to the short stay nurse when you get to Short Stay.  If you are admitted to the hospital after surgery: Your blood sugar will be checked by the staff and you will probably be given insulin  after surgery (instead of oral diabetes medicines) to make sure you have good blood sugar levels. The goal for blood sugar control after surgery is 80-180 mg/dL.   Reviewed and Endorsed by [facility] Patient Education Committee, August 2015                            You may not have any metal on your body including jewelry, and body piercing             Do not wear lotions, powders, cologne, or deodorant              Men may shave face and neck.   Do not bring valuables to the hospital. [place] IS NOT             RESPONSIBLE   FOR VALUABLES.   Contacts, glasses, dentures or bridgework may not be worn into surgery.   Bring small overnight bag day of surgery.   DO NOT BRING YOUR HOME MEDICATIONS TO THE HOSPITAL. PHARMACY WILL DISPENSE MEDICATIONS LISTED ON YOUR MEDICATION LIST TO YOU DURING YOUR ADMISSION IN THE HOSPITAL!              Please read over  the following fact sheets you were given: IF YOU HAVE QUESTIONS ABOUT YOUR PRE-OP INSTRUCTIONS PLEASE CALL [name]   If you received a COVID test during your pre-op visit  it is requested that you wear a mask when out in public, stay away from anyone that may not be feeling well and notify your surgeon if you develop symptoms. If you test positive for Covid or have been in contact with anyone that has tested positive in the last 10 days  please notify you surgeon.    [place] - Preparing for Surgery Before surgery, you can play an important role.  Because skin is not sterile, your skin needs to be as free of germs as possible.  You can reduce the number of germs on your skin by washing with CHG (chlorahexidine gluconate) soap before surgery.  CHG is an antiseptic cleaner which kills germs and bonds with the skin to continue killing germs even after washing. Please DO NOT use if you have an allergy to CHG or antibacterial soaps.  If your skin becomes reddened/irritated stop using the CHG and inform your nurse when you arrive at Short Stay. Do not shave (including legs and underarms) for at least 48 hours prior to the first CHG shower.  You may shave your face/neck.  Please follow these instructions carefully:  1.  Shower with CHG Soap the night before surgery and the  morning of surgery.  2.  If you choose to wash your hair, wash your hair first as usual with your normal  shampoo.  3.  After you shampoo, rinse your hair and body thoroughly to remove the shampoo.                             4.  Use CHG as you would any other liquid soap.  You can apply chg directly to the skin and wash.  Gently with a scrungie or clean washcloth.  5.  Apply the CHG Soap to your body ONLY FROM THE NECK DOWN.   Do   not use on face/ open                           Wound or open sores. Avoid contact with eyes, ears mouth and   genitals (private parts).                       Wash face,  Genitals (private parts) with your normal soap.             6.  Wash thoroughly, paying special attention to the area where your    surgery  will be performed.  7.  Thoroughly rinse your body with warm water  from the neck down.  8.  DO NOT shower/wash with your normal soap after using and rinsing off the CHG Soap.                9.  Pat yourself dry with a clean towel.            10.  Wear clean pajamas.            11.  Place clean sheets on your bed the night of your  first shower and do not  sleep with pets. Day of Surgery : Do not apply any lotions/deodorants the morning of surgery.  Please wear clean clothes to the hospital/surgery center.  FAILURE  TO FOLLOW THESE INSTRUCTIONS MAY RESULT IN THE CANCELLATION OF YOUR SURGERY  PATIENT SIGNATURE_________________________________  NURSE SIGNATURE__________________________________  ________________________________________________________________________  Bradley [name]  An incentive spirometer is a tool that can help keep your lungs clear and active. This tool measures how well you are filling your lungs with each breath. Taking long deep breaths may help reverse or decrease the chance of developing breathing (pulmonary) problems (especially infection) following: A long period of time when you are unable to move or be active. BEFORE THE PROCEDURE  If the spirometer includes an indicator to show your best effort, your nurse or respiratory therapist will set it to a desired goal. If possible, sit up straight or lean slightly forward. Try not to slouch. Hold the incentive spirometer in an upright position. INSTRUCTIONS FOR USE  Sit on the edge of your bed if possible, or sit up as far as you can in bed or on a chair. Hold the incentive spirometer in an upright position. Breathe out normally. Place the mouthpiece in your mouth and seal your lips tightly around it. Breathe in slowly and as deeply as possible, raising the piston or the ball toward the top of the column. Hold your breath for 3-5 seconds or for as long as possible. Allow the piston or ball to fall to the bottom of the column. Remove the mouthpiece from your mouth and breathe out normally. Rest for a few seconds and repeat Steps 1 through 7 at least 10 times every 1-2 hours when you are awake. Take your time and take a few normal breaths between deep breaths. The spirometer may include an indicator to show your best effort. Use the indicator  as a goal to work toward during each repetition. After each set of 10 deep breaths, practice coughing to be sure your lungs are clear. If you have an incision (the cut made at the time of surgery), support your incision when coughing by placing a pillow or rolled up towels firmly against it. Once you are able to get out of bed, walk around indoors and cough well. You may stop using the incentive spirometer when instructed by your caregiver.  RISKS AND COMPLICATIONS Take your time so you do not get dizzy or light-headed. If you are in pain, you may need to take or ask for pain medication before doing incentive spirometry. It is harder to take a deep breath if you are having pain. AFTER USE Rest and breathe slowly and easily. It can be helpful to keep track of a log of your progress. Your caregiver can provide you with a simple table to help with this. If you are using the spirometer at home, follow these instructions: SEEK MEDICAL CARE IF:  You are having difficultly using the spirometer. You have trouble using the spirometer as often as instructed. Your pain medication is not giving enough relief while using the spirometer. You develop fever of 100.5 F (38.1 C) or higher. SEEK IMMEDIATE MEDICAL CARE IF:  You cough up bloody sputum that had not been present before. You develop fever of 102 F (38.9 C) or greater. You develop worsening pain at or near the incision site. MAKE SURE YOU:  Understand these instructions. Will watch your condition. Will get help right away if you are not doing well or get worse. Document Released: 12/17/2006 Document Revised: 10/29/2011 Document Reviewed: 02/17/2007 [facility] Patient Information 2014 [place], [place].   ________________________________________________________________________

## 2023-12-13 NOTE — Progress Notes (Addendum)
 COVID Vaccine Completed: yes  Date of COVID positive in last 90 days:  PCP - [facility]- Dr. Britta Candy (she is retiring) Cardiologist - Riccardo Chamberlain, MD LOV 02/25/23  Chest x-ray - 06/24/23 Epic EKG - 06/11/23 Epic Stress Test - years ago per pt ECHO -  Cardiac Cath - n/a Pacemaker/ICD device last checked: n/a Spinal Cord Stimulator: n/a  Bowel Prep - yes, Miralax , antibiotics, clear liquids day before  Sleep Study - n/a CPAP -   Fasting Blood Sugar - 120-157 Checks Blood Sugar every 3 days  Last dose of GLP1 agonist-  N/A GLP1 instructions:  Hold 7 days before surgery    Last dose of SGLT-2 inhibitors-  N/A SGLT-2 instructions:  Hold 3 days before surgery    Blood Thinner Instructions:  Last dose: n/a Time: Aspirin Instructions: Last Dose:  Activity level: Can go up a flight of stairs and perform activities of daily living without stopping and without symptoms of chest pain or shortness of breath.  Anesthesia review: DM2, left fascicular block, HTN  Patient denies shortness of breath, fever, cough and chest pain at PAT appointment  Patient verbalized understanding of instructions that were given to them at the PAT appointment. Patient was also instructed that they will need to review over the PAT instructions again at home before surgery.

## 2023-12-16 ENCOUNTER — Encounter (HOSPITAL_COMMUNITY): Payer: Self-pay

## 2023-12-16 ENCOUNTER — Encounter (HOSPITAL_COMMUNITY)
Admission: RE | Admit: 2023-12-16 | Discharge: 2023-12-16 | Disposition: A | Discharge status: Home / Self Care | Source: Ambulatory Visit | Attending: Surgery | Admitting: Surgery

## 2023-12-16 ENCOUNTER — Other Ambulatory Visit: Payer: Self-pay

## 2023-12-16 VITALS — BP 145/79 | HR 65 | Temp 98.5°F | Resp 16 | Ht 68.0 in | Wt 237.0 lb

## 2023-12-16 DIAGNOSIS — Z01812 Encounter for preprocedural laboratory examination: Secondary | ICD-10-CM | POA: Insufficient documentation

## 2023-12-16 DIAGNOSIS — Z01818 Encounter for other preprocedural examination: Secondary | ICD-10-CM | POA: Diagnosis present

## 2023-12-16 DIAGNOSIS — E119 Type 2 diabetes mellitus without complications: Secondary | ICD-10-CM | POA: Diagnosis not present

## 2023-12-16 LAB — BASIC METABOLIC PANEL WITH GFR
Anion gap: 12 (ref 5–15)
BUN: 17 mg/dL (ref 8–23)
CO2: 28 mmol/L (ref 22–32)
Calcium: 9.2 mg/dL (ref 8.9–10.3)
Chloride: 99 mmol/L (ref 98–111)
Creatinine, Ser: 1.15 mg/dL (ref 0.61–1.24)
GFR, Estimated: >60 mL/min (ref >60)
Glucose, Bld: 119 mg/dL — ABNORMAL HIGH (ref 70–99)
Potassium: 3.8 mmol/L (ref 3.5–5.1)
Sodium: 139 mmol/L (ref 135–145)

## 2023-12-16 LAB — CBC
HCT: 44.6 % (ref 39.0–52.0)
Hemoglobin: 13.9 g/dL (ref 13.0–17.0)
MCH: 28.1 pg (ref 26.0–34.0)
MCHC: 31.2 g/dL (ref 30.0–36.0)
MCV: 90.3 fL (ref 80.0–100.0)
Platelets: 127 K/uL — ABNORMAL LOW (ref 150–400)
RBC: 4.94 MIL/uL (ref 4.22–5.81)
RDW: 15.1 % (ref 11.5–15.5)
WBC: 7.2 K/uL (ref 4.0–10.5)
nRBC: 0 % (ref 0.0–0.2)

## 2023-12-16 LAB — HEMOGLOBIN A1C
Hgb A1c MFr Bld: 6.8 % — ABNORMAL HIGH (ref 4.8–5.6)
Mean Plasma Glucose: 148.46 mg/dL

## 2023-12-16 LAB — GLUCOSE, CAPILLARY: Glucose-Capillary: 94 mg/dL (ref 70–99)

## 2023-12-25 ENCOUNTER — Ambulatory Visit: Admitting: Gastroenterology

## 2023-12-25 ENCOUNTER — Encounter: Payer: Self-pay | Admitting: Gastroenterology

## 2023-12-25 ENCOUNTER — Encounter: Admitting: Gastroenterology

## 2023-12-25 VITALS — BP 148/75 | HR 64 | Temp 98.0°F | Resp 20 | Ht 68.0 in | Wt 245.0 lb

## 2023-12-25 DIAGNOSIS — D123 Benign neoplasm of transverse colon: Secondary | ICD-10-CM

## 2023-12-25 DIAGNOSIS — D120 Benign neoplasm of cecum: Secondary | ICD-10-CM

## 2023-12-25 DIAGNOSIS — D122 Benign neoplasm of ascending colon: Secondary | ICD-10-CM

## 2023-12-25 DIAGNOSIS — K5730 Diverticulosis of large intestine without perforation or abscess without bleeding: Secondary | ICD-10-CM

## 2023-12-25 DIAGNOSIS — K635 Polyp of colon: Secondary | ICD-10-CM

## 2023-12-25 DIAGNOSIS — Z433 Encounter for attention to colostomy: Secondary | ICD-10-CM

## 2023-12-25 DIAGNOSIS — D121 Benign neoplasm of appendix: Secondary | ICD-10-CM

## 2023-12-25 DIAGNOSIS — K5140 Inflammatory polyps of colon without complications: Secondary | ICD-10-CM

## 2023-12-25 DIAGNOSIS — K56699 Other intestinal obstruction unspecified as to partial versus complete obstruction: Secondary | ICD-10-CM

## 2023-12-25 MED ORDER — SODIUM CHLORIDE 0.9 % IV SOLN
500.0000 mL | Freq: Once | INTRAVENOUS | Status: DC
Start: 2023-12-25 — End: 2023-12-25

## 2023-12-25 NOTE — Progress Notes (Signed)
 [facility] History and Physical   Primary Care Physician:  Med[facility], [facility]   Reason for Procedure:   Colonic stricture, s/p colostomy  Plan:    colonoscopy     HPI: Bradley [name] is a 73 y.o. male  here for colonoscopy - history of benign colonic stricture s/p colostomy, having takedown scheduled tomorrow with general surgery. They are requesting pre-operative colonoscopy clearance.    Otherwise feels well without any cardiopulmonary symptoms.   I have discussed risks / benefits of anesthesia and endoscopic procedure with Rebecka Can and they wish to proceed with the exams as outlined today.    Past Medical History:  Diagnosis Date   Arthritis    Bipolar 2 disorder (HCC) 2005   Also carries diagnosis of schizophrenia.   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      Past Surgical History:  Procedure Laterality Date   COLON RESECTION N/A 06/14/2023   Procedure: EXPLORATORY LAPAROTOMY; TAKEDOWN OF SPLENIC FLEXURE; LEFT HEMICOLECTOMY, COLOSTOMY;  Surgeon: Candyce Champagne, MD;  Location: [facility];  Service: General;  Laterality: N/A;   COLONOSCOPY  2009, 2015   ENTEROSCOPY N/A 07/28/2015   Procedure: ENTEROSCOPY;  Surgeon: Asencion Blacksmith, MD;  Location[facility] ENDOSCOPY;  Service: Endoscopy;  Laterality: N/A;   FLEXIBLE SIGMOIDOSCOPY N/A 06/13/2023   Procedure: FLEXIBLE SIGMOIDOSCOPY;  Surgeon: Ace Holder, MD;  Location: [facility] ENDOSCOPY;  Service: Gastroenterology;  Laterality: N/A;   SUBMUCOSAL TATTOO INJECTION  06/13/2023   Procedure: SUBMUCOSAL TATTOO INJECTION;  Surgeon: Ace Holder, MD;  Location: [facility] ENDOSCOPY;   Service: Gastroenterology;;    Prior to Admission medications   Medication Sig Start Date End Date Taking? Authorizing Provider  albuterol  (VENTOLIN  HFA) 108 (90 Base) MCG/ACT inhaler Inhale 2 puffs into the lungs every 6 (six) hours as needed for wheezing or shortness of breath. 11/04/23  Yes [provider]  amLODipine  (NORVASC ) 10 MG tablet Take 10 mg by mouth at bedtime. 10/14/23  Yes [provider]  divalproex  (DEPAKOTE  ER) 500 MG 24 hr tablet Take 1,000 mg by mouth at bedtime.   Yes [provider]  hydrochlorothiazide  (HYDRODIURIL ) 50 MG tablet Take 50 mg by mouth at bedtime.   Yes [provider]  lisinopril  (ZESTRIL ) 40 MG tablet Take 40 mg by mouth at bedtime. 07/22/23  Yes [provider]  risperiDONE  (RISPERDAL ) 2 MG tablet Take 2 mg by mouth at bedtime. 11/08/23  Yes [provider]  metroNIDAZOLE  (FLAGYL ) 500 MG tablet Take 1,000 mg by mouth See admin instructions. Take 2 tablets (1,000 mg total) by mouth as directed (Per Bowel Prep Instructions) for up to 3 doses Take according to your procedure colon prep instructions. Take 2 tablets at 2 pm, 3 pm and 10 pm the day prior to your surgery. 10/14/23   [provider]  neomycin (MYCIFRADIN) 500 MG tablet Take 1,000 mg by mouth See admin instructions. Take 2 tablets (1,000 mg total) by mouth as directed (Per Bowel Prep Instructions) for up to 3 doses Take according to your procedure colon prep instructions. Take 2 tablets at 2 pm, 3 pm and 10 pm the day  prior to your surgery. 11/21/23   [provider]  pravastatin  (PRAVACHOL ) 40 MG tablet Take 1 tablet (40 mg total) by mouth every evening. 04/09/23 12/10/26  Jann Melody, MD    Current Outpatient Medications  Medication Sig Dispense Refill   albuterol  (VENTOLIN  HFA) 108 (90 Base) MCG/ACT inhaler Inhale 2 puffs into the lungs every 6 (six) hours as needed for wheezing or shortness of breath.     amLODipine   (NORVASC ) 10 MG tablet Take 10 mg by mouth at bedtime.     divalproex  (DEPAKOTE  ER) 500 MG 24 hr tablet Take 1,000 mg by mouth at bedtime.     hydrochlorothiazide  (HYDRODIURIL ) 50 MG tablet Take 50 mg by mouth at bedtime.     lisinopril  (ZESTRIL ) 40 MG tablet Take 40 mg by mouth at bedtime.     risperiDONE  (RISPERDAL ) 2 MG tablet Take 2 mg by mouth at bedtime.     metroNIDAZOLE  (FLAGYL ) 500 MG tablet Take 1,000 mg by mouth See admin instructions. Take 2 tablets (1,000 mg total) by mouth as directed (Per Bowel Prep Instructions) for up to 3 doses Take according to your procedure colon prep instructions. Take 2 tablets at 2 pm, 3 pm and 10 pm the day prior to your surgery.     neomycin (MYCIFRADIN) 500 MG tablet Take 1,000 mg by mouth See admin instructions. Take 2 tablets (1,000 mg total) by mouth as directed (Per Bowel Prep Instructions) for up to 3 doses Take according to your procedure colon prep instructions. Take 2 tablets at 2 pm, 3 pm and 10 pm the day prior to your surgery.     pravastatin  (PRAVACHOL ) 40 MG tablet Take 1 tablet (40 mg total) by mouth every evening. 90 tablet 3   Current Facility-Administered Medications  Medication Dose Route Frequency Provider Last Rate Last Admin   0.9 %  sodium chloride  infusion  500 mL Intravenous Once Reisa Coppola, Lendon Queen, MD        Allergies as of 12/25/2023   (No Known Allergies)    Family History  Problem Relation Age of Onset   Cancer Brother        abdominal CA   Colon cancer Neg Hx    Esophageal cancer Neg Hx    Rectal cancer Neg Hx    Stomach cancer Neg Hx     Social History   Socioeconomic History   Marital status: Divorced    Spouse name: Not on file   Number of children: Not on file   Years of education: Not on file   Highest education level: Not on file  Occupational History   Not on file  Tobacco Use   Smoking status: Every Day    Current packs/day: 1.00    Average packs/day: 1 pack/day for 20.0 years (20.0 ttl pk-yrs)     Types: Cigarettes   Smokeless tobacco: Never  Vaping Use   Vaping status: Former  Substance and Sexual Activity   Alcohol use: No   Drug use: No   Sexual activity: Not Currently  Other Topics Concern   Not on file  Social History Narrative   Patient originally from [place].   Social Drivers of Corporate investment banker Strain: Not on File (12/07/2021)   Received from [employer], [employer]    Financial Resource Strain: 0  Food Insecurity: No Food Insecurity (06/11/2023)   Hunger Vital Sign    Worried About Running Out of Food in the Last Year: Never  true    Ran Out of Food in the Last Year: Never true  Transportation Needs: No Transportation Needs (06/11/2023)   PRAPARE - Administrator, Civil Service (Medical): No    Lack of Transportation (Non-Medical): No  Physical Activity: Not on File (12/07/2021)   Received from [place], [place]   Physical Activity    Physical Activity: 0  Stress: Not on File (12/07/2021)   Received from [facility], [place]   Stress    Stress: 0  Social Connections: Not on File (05/02/2023)   Received from [facility]   Social Connections    Connectedness: 0  Intimate Partner Violence: Not At Risk (06/11/2023)   Humiliation, Afraid, Rape, and Kick questionnaire    Fear of Current or Ex-Partner: No    Emotionally Abused: No    Physically Abused: No    Sexually Abused: No    Review of Systems: All other review of systems negative except as mentioned in the HPI.  Physical Exam: Vital signs BP (!) 149/76   Pulse 67   Temp 98 F (36.7 C) (Skin)   Ht 5\' 8"  (1.727 m)   Wt 245 lb (111.1 kg)   SpO2 93%   BMI 37.25 kg/m   General:   Alert,  Well-developed, pleasant and cooperative in NAD Lungs:  Clear throughout to auscultation.   Heart:  Regular rate and rhythm Abdomen:  Soft, nontender and nondistended.   Neuro/Psych:  Alert and cooperative. Normal mood and affect. A and O x 3  Christi Coward, MD [facility]  Gastroenterology

## 2023-12-25 NOTE — Op Note (Signed)
 [facility] Patient Name: Bradley [name] Procedure Date: 12/25/2023 3:43 PM MRN: [id number] Endoscopist: Landon Pinion P. General Kenner , MD, [id number] Age: 73 Referring MD:  Date of Birth: [birth year] Gender: Male Account #: [id number] Procedure:                Colonoscopy Indications:              Follow-up of colonic stricture - surgery s/p                            colostomy, pending takedown of colostomy tomorrow                            with general surgery Medicines:                Monitored Anesthesia Care Procedure:                Pre-Anesthesia Assessment:                           - Prior to the procedure, a History and Physical                            was performed, and patient medications and                            allergies were reviewed. The patient's tolerance of                            previous anesthesia was also reviewed. The risks                            and benefits of the procedure and the sedation                            options and risks were discussed with the patient.                            All questions were answered, and informed consent                            was obtained. Prior Anticoagulants: The patient has                            taken no anticoagulant or antiplatelet agents. ASA                            Grade Assessment: III - A patient with severe                            systemic disease. After reviewing the risks and                            benefits, the patient was deemed in satisfactory  condition to undergo the procedure.                           After obtaining informed consent, the colonoscope                            was passed under direct vision. Throughout the                            procedure, the patient's blood pressure, pulse, and                            oxygen saturations were monitored continuously. The                            Olympus Scope SN: [id number] was  introduced through                            the descending colostomy and advanced to the the                            cecum, identified by appendiceal orifice and                            ileocecal valve. The colonoscopy was performed                            without difficulty. The patient tolerated the                            procedure well. The quality of the bowel                            preparation was adequate. The ileocecal valve,                            appendiceal orifice, and rectum were photographed. Scope In: 3:57:44 PM Scope Out: 4:20:57 PM Scope Withdrawal Time: 0 hours 19 minutes 56 seconds  Total Procedure Duration: 0 hours 23 minutes 13 seconds  Findings:                 The perianal and digital rectal examinations were                            normal.                           The rectum appeared normal although was unprepped                            with residual stool burden and could not be cleared.                           A polyp was found in the appendiceal orifice. The  polyp was sessile. Initially this appeared to be                            superficial about 4mm in size, however when                            removing this the base of the polyp prolapsed out                            of the AO and it was much larger than initially                            appreciated with the base arising from the                            appendix. Polyp resection was incomplete (only                            superficial aspect removed), and the resected                            tissue was retrieved.                           A diminutive polyp was found in the cecum. The                            polyp was sessile. The polyp was removed with a                            cold snare. Resection and retrieval were complete.                           A 3 mm polyp was found in the ascending colon. The                             polyp was flat. The polyp was removed with a cold                            snare. Resection and retrieval were complete.                           Two sessile polyps were found in the hepatic                            flexure. The polyps were 2 to 5 mm in size. These                            polyps were removed with a cold snare. Resection                            and retrieval were complete.  Three flat polyps were found in the transverse                            colon. The polyps were 2 to 3 mm in size. These                            polyps were removed with a cold snare. Resection                            and retrieval were complete.                           Multiple diverticula were found in the entire colon.                           The exam was otherwise without abnormality. The                            ostomy appeared normal Complications:            No immediate complications. Estimated blood loss:                            Minimal. Estimated Blood Loss:     Estimated blood loss was minimal. Impression:               - The rectum is currently diverted and filled with                            old stool / mucous but was normal of what was                            examined.                           - One polyp at the appendiceal orifice, during                            removal as above prolapsed out of the appendix -                            larger than initially appreciated and given base                            could not be visualized as it is arising from the                            AO, it could not be removed in entirety. Polyp                            resection was incomplete, and the resected tissue                            was retrieved.                           -  One diminutive polyp in the cecum, removed with a                            cold snare. Resected and retrieved.                           - One 3  mm polyp in the ascending colon, removed                            with a cold snare. Resected and retrieved.                           - Two 2 to 5 mm polyps at the hepatic flexure,                            removed with a cold snare. Resected and retrieved.                           - Three 2 to 3 mm polyps in the transverse colon,                            removed with a cold snare. Resected and retrieved.                           - Diverticulosis in the entire examined colon.                           - The examination was otherwise normal.                           Recommend appendectomy at the time of colostomy                            takedown to remove appendiceal polyp. Will discuss                            with the patient's surgeon. Recommendation:           - Patient has a contact number available for                            emergencies. The signs and symptoms of potential                            delayed complications were discussed with the                            patient. Return to normal activities tomorrow.                            Written discharge instructions were provided to the                            patient.                           -  Diet per general surgery for pre-op (takedown of                            colostomy tomorrow)                           - Continue present medications.                           - Await pathology results. Landon Pinion P. Liberty Seto, MD 12/25/2023 4:32:51 PM This report has been signed electronically.

## 2023-12-25 NOTE — Patient Instructions (Signed)
 Continue present medications. Await pathology results. Please read handouts provided.   YOU HAD AN ENDOSCOPIC PROCEDURE TODAY AT THE [facility]:   Refer to the procedure report that was given to you for any specific questions about what was found during the examination.  If the procedure report does not answer your questions, please call your gastroenterologist to clarify.  If you requested that your care partner not be given the details of your procedure findings, then the procedure report has been included in a sealed envelope for you to review at your convenience later.  YOU SHOULD EXPECT: Some feelings of bloating in the abdomen. Passage of more gas than usual.  Walking can help get rid of the air that was put into your GI tract during the procedure and reduce the bloating. If you had a lower endoscopy (such as a colonoscopy or flexible sigmoidoscopy) you may notice spotting of blood in your stool or on the toilet paper. If you underwent a bowel prep for your procedure, you may not have a normal bowel movement for a few days.  Please Note:  You might notice some irritation and congestion in your nose or some drainage.  This is from the oxygen used during your procedure.  There is no need for concern and it should clear up in a day or so.  SYMPTOMS TO REPORT IMMEDIATELY:  Following lower endoscopy (colonoscopy or flexible sigmoidoscopy):  Excessive amounts of blood in the stool  Significant tenderness or worsening of abdominal pains  Swelling of the abdomen that is new, acute  Fever of 100F or higher  Following upper endoscopy (EGD)  Vomiting of blood or coffee ground material  New chest pain or pain under the shoulder blades  Painful or persistently difficult swallowing  New shortness of breath  Fever of 100F or higher  Black, tarry-looking stools  For urgent or emergent issues, a gastroenterologist can be reached at any hour by calling [phone]. Do not use  MyChart messaging for urgent concerns.    DIET:   Diet per general surgery for pre-op ( surgery tomorrow ).  ACTIVITY:  You should plan to take it easy for the rest of today and you should NOT DRIVE or use heavy machinery until tomorrow (because of the sedation medicines used during the test).    FOLLOW UP: Our staff will call the number listed on your records the next business day following your procedure.  We will call around 7:15- 8:00 am to check on you and address any questions or concerns that you may have regarding the information given to you following your procedure. If we do not reach you, we will leave a message.     If any biopsies were taken you will be contacted by phone or by letter within the next 1-3 weeks.  Please call us  at [phone] if you have not heard about the biopsies in 3 weeks.    SIGNATURES/CONFIDENTIALITY: You and/or your care partner have signed paperwork which will be entered into your electronic medical record.  These signatures attest to the fact that that the information above on your After Visit Summary has been reviewed and is understood.  Full responsibility of the confidentiality of this discharge information lies with you and/or your care-partner.

## 2023-12-25 NOTE — Progress Notes (Signed)
 Pt's states no medical or surgical changes since previsit or office visit.

## 2023-12-25 NOTE — Progress Notes (Signed)
 Called to room to assist during endoscopic procedure.  Patient ID and intended procedure confirmed with present staff. Received instructions for my participation in the procedure from the performing physician.

## 2023-12-25 NOTE — Progress Notes (Signed)
 A/o x 3, VSS, gd SR's, pleased with anesthesia, report to RN

## 2023-12-26 ENCOUNTER — Other Ambulatory Visit: Payer: Self-pay

## 2023-12-26 ENCOUNTER — Encounter (HOSPITAL_COMMUNITY)
Admission: RE | Disposition: A | Discharge status: Home Health | Payer: Self-pay | Source: Ambulatory Visit | Attending: Surgery

## 2023-12-26 ENCOUNTER — Inpatient Hospital Stay (HOSPITAL_COMMUNITY): Payer: Self-pay | Admitting: Physician Assistant

## 2023-12-26 ENCOUNTER — Encounter (HOSPITAL_COMMUNITY): Payer: Self-pay | Admitting: Surgery

## 2023-12-26 ENCOUNTER — Telehealth: Payer: Self-pay

## 2023-12-26 ENCOUNTER — Inpatient Hospital Stay (HOSPITAL_COMMUNITY): Admitting: Anesthesiology

## 2023-12-26 ENCOUNTER — Inpatient Hospital Stay (HOSPITAL_COMMUNITY)
Admission: RE | Admit: 2023-12-26 | Discharge: 2023-12-31 | DRG: 330 | Disposition: A | Discharge status: Home Health | Source: Ambulatory Visit | Attending: Surgery | Admitting: Surgery

## 2023-12-26 DIAGNOSIS — F3181 Bipolar II disorder: Secondary | ICD-10-CM | POA: Diagnosis present

## 2023-12-26 DIAGNOSIS — K660 Peritoneal adhesions (postprocedural) (postinfection): Secondary | ICD-10-CM | POA: Diagnosis present

## 2023-12-26 DIAGNOSIS — F17210 Nicotine dependence, cigarettes, uncomplicated: Secondary | ICD-10-CM | POA: Diagnosis present

## 2023-12-26 DIAGNOSIS — E1122 Type 2 diabetes mellitus with diabetic chronic kidney disease: Secondary | ICD-10-CM | POA: Diagnosis present

## 2023-12-26 DIAGNOSIS — E119 Type 2 diabetes mellitus without complications: Secondary | ICD-10-CM

## 2023-12-26 DIAGNOSIS — K56699 Other intestinal obstruction unspecified as to partial versus complete obstruction: Secondary | ICD-10-CM | POA: Diagnosis present

## 2023-12-26 DIAGNOSIS — Z808 Family history of malignant neoplasm of other organs or systems: Secondary | ICD-10-CM

## 2023-12-26 DIAGNOSIS — E559 Vitamin D deficiency, unspecified: Secondary | ICD-10-CM | POA: Diagnosis present

## 2023-12-26 DIAGNOSIS — Z91199 Patient's noncompliance with other medical treatment and regimen due to unspecified reason: Secondary | ICD-10-CM

## 2023-12-26 DIAGNOSIS — N1831 Chronic kidney disease, stage 3a: Secondary | ICD-10-CM | POA: Diagnosis present

## 2023-12-26 DIAGNOSIS — F250 Schizoaffective disorder, bipolar type: Secondary | ICD-10-CM | POA: Diagnosis present

## 2023-12-26 DIAGNOSIS — I129 Hypertensive chronic kidney disease with stage 1 through stage 4 chronic kidney disease, or unspecified chronic kidney disease: Secondary | ICD-10-CM | POA: Diagnosis present

## 2023-12-26 DIAGNOSIS — Z6835 Body mass index (BMI) 35.0-35.9, adult: Secondary | ICD-10-CM

## 2023-12-26 DIAGNOSIS — Z860101 Personal history of adenomatous and serrated colon polyps: Secondary | ICD-10-CM | POA: Diagnosis not present

## 2023-12-26 DIAGNOSIS — Z433 Encounter for attention to colostomy: Secondary | ICD-10-CM | POA: Diagnosis not present

## 2023-12-26 DIAGNOSIS — E785 Hyperlipidemia, unspecified: Secondary | ICD-10-CM | POA: Diagnosis present

## 2023-12-26 DIAGNOSIS — I10 Essential (primary) hypertension: Secondary | ICD-10-CM | POA: Insufficient documentation

## 2023-12-26 HISTORY — DX: Encounter for general psychiatric examination, requested by authority: Z04.6

## 2023-12-26 LAB — GLUCOSE, CAPILLARY
Glucose-Capillary: 127 mg/dL — ABNORMAL HIGH (ref 70–99)
Glucose-Capillary: 146 mg/dL — ABNORMAL HIGH (ref 70–99)

## 2023-12-26 SURGERY — CLOSURE, COLOSTOMY, ROBOT-ASSISTED
Anesthesia: General

## 2023-12-26 MED ORDER — HYDRALAZINE HCL 20 MG/ML IJ SOLN: 10.0000 mg | INTRAMUSCULAR | Status: DC | PRN

## 2023-12-26 MED ORDER — LACTATED RINGERS IV SOLN: INTRAVENOUS | Status: DC

## 2023-12-26 MED ORDER — ONDANSETRON HCL 4 MG/2ML IJ SOLN
INTRAMUSCULAR | Status: AC
  Filled 2023-12-26: qty 2

## 2023-12-26 MED ORDER — BUPIVACAINE LIPOSOME 1.3 % IJ SUSP: 20.0000 mL | Freq: Once | INTRAMUSCULAR | Status: DC

## 2023-12-26 MED ORDER — ALBUTEROL SULFATE HFA 108 (90 BASE) MCG/ACT IN AERS
INHALATION_SPRAY | RESPIRATORY_TRACT | Status: DC | PRN
  Administered 2023-12-26: 6 via RESPIRATORY_TRACT

## 2023-12-26 MED ORDER — SODIUM CHLORIDE 0.9% FLUSH: 3.0000 mL | INTRAVENOUS | Status: DC | PRN

## 2023-12-26 MED ORDER — ENOXAPARIN SODIUM 40 MG/0.4ML IJ SOSY
40.0000 mg | PREFILLED_SYRINGE | INTRAMUSCULAR | Status: DC
  Administered 2023-12-27 – 2023-12-31 (×5): 40 mg via SUBCUTANEOUS
  Filled 2023-12-26 (×5): qty 0.4

## 2023-12-26 MED ORDER — NAPHAZOLINE-GLYCERIN 0.012-0.25 % OP SOLN: 1.0000 [drp] | Freq: Four times a day (QID) | OPHTHALMIC | Status: DC | PRN

## 2023-12-26 MED ORDER — DIPHENHYDRAMINE HCL 50 MG/ML IJ SOLN: 12.5000 mg | Freq: Four times a day (QID) | INTRAMUSCULAR | Status: DC | PRN

## 2023-12-26 MED ORDER — GLYCOPYRROLATE 0.2 MG/ML IJ SOLN
INTRAMUSCULAR | Status: DC | PRN
  Administered 2023-12-26 (×4): .1 mg via INTRAVENOUS

## 2023-12-26 MED ORDER — MIDAZOLAM HCL 5 MG/5ML IJ SOLN
INTRAMUSCULAR | Status: DC | PRN
  Administered 2023-12-26 (×2): 1 mg via INTRAVENOUS

## 2023-12-26 MED ORDER — ENSURE PRE-SURGERY PO LIQD
296.0000 mL | Freq: Once | ORAL | Status: DC
Start: 2023-12-27 — End: 2023-12-26

## 2023-12-26 MED ORDER — LACTATED RINGERS IV SOLN: Freq: Three times a day (TID) | INTRAVENOUS | Status: AC | PRN

## 2023-12-26 MED ORDER — PRAVASTATIN SODIUM 20 MG PO TABS
40.0000 mg | ORAL_TABLET | Freq: Every evening | ORAL | Status: DC
  Administered 2023-12-26 – 2023-12-30 (×5): 40 mg via ORAL
  Filled 2023-12-26 (×5): qty 2

## 2023-12-26 MED ORDER — SODIUM CHLORIDE 0.9 % IV SOLN: 250.0000 mL | INTRAVENOUS | Status: DC | PRN

## 2023-12-26 MED ORDER — SUGAMMADEX SODIUM 200 MG/2ML IV SOLN
INTRAVENOUS | Status: DC | PRN
  Administered 2023-12-26 (×2): 100 mg via INTRAVENOUS
  Administered 2023-12-26: 300 mg via INTRAVENOUS

## 2023-12-26 MED ORDER — ALUM & MAG HYDROXIDE-SIMETH 200-200-20 MG/5ML PO SUSP: 30.0000 mL | Freq: Four times a day (QID) | ORAL | Status: DC | PRN

## 2023-12-26 MED ORDER — FENTANYL CITRATE (PF) 100 MCG/2ML IJ SOLN
INTRAMUSCULAR | Status: DC | PRN
  Administered 2023-12-26 (×2): 50 ug via INTRAVENOUS

## 2023-12-26 MED ORDER — ALBUMIN HUMAN 5 % IV SOLN
INTRAVENOUS | Status: AC
  Filled 2023-12-26: qty 250

## 2023-12-26 MED ORDER — SUCCINYLCHOLINE CHLORIDE 200 MG/10ML IV SOSY
PREFILLED_SYRINGE | INTRAVENOUS | Status: DC | PRN
  Administered 2023-12-26: 160 mg via INTRAVENOUS

## 2023-12-26 MED ORDER — ACETAMINOPHEN 500 MG PO TABS: 1000.0000 mg | ORAL_TABLET | Freq: Once | ORAL | Status: DC

## 2023-12-26 MED ORDER — CHLORHEXIDINE GLUCONATE 0.12 % MT SOLN
15.0000 mL | Freq: Once | OROMUCOSAL | Status: AC
  Administered 2023-12-26: 15 mL via OROMUCOSAL

## 2023-12-26 MED ORDER — ONDANSETRON HCL 4 MG/2ML IJ SOLN
INTRAMUSCULAR | Status: DC | PRN
  Administered 2023-12-26: 4 mg via INTRAVENOUS

## 2023-12-26 MED ORDER — NEOMYCIN SULFATE 500 MG PO TABS: 1000.0000 mg | ORAL_TABLET | ORAL | Status: DC

## 2023-12-26 MED ORDER — PHENYLEPHRINE HCL-NACL 20-0.9 MG/250ML-% IV SOLN
INTRAVENOUS | Status: DC | PRN
  Administered 2023-12-26: 40 ug/min via INTRAVENOUS

## 2023-12-26 MED ORDER — BUPIVACAINE-EPINEPHRINE 0.25% -1:200000 IJ SOLN
INTRAMUSCULAR | Status: DC | PRN
  Administered 2023-12-26: 50 mL

## 2023-12-26 MED ORDER — AMLODIPINE BESYLATE 10 MG PO TABS
10.0000 mg | ORAL_TABLET | Freq: Every day | ORAL | Status: DC
  Administered 2023-12-26 – 2023-12-30 (×4): 10 mg via ORAL
  Filled 2023-12-26 (×5): qty 1

## 2023-12-26 MED ORDER — KETAMINE HCL 10 MG/ML IJ SOLN
INTRAMUSCULAR | Status: DC | PRN
  Administered 2023-12-26: 25 mg via INTRAVENOUS

## 2023-12-26 MED ORDER — ORAL CARE MOUTH RINSE: 15.0000 mL | Freq: Once | OROMUCOSAL | Status: DC

## 2023-12-26 MED ORDER — FENTANYL CITRATE PF 50 MCG/ML IJ SOSY
25.0000 ug | PREFILLED_SYRINGE | INTRAMUSCULAR | Status: DC | PRN
Start: 2023-12-26 — End: 2023-12-26

## 2023-12-26 MED ORDER — BUPIVACAINE-EPINEPHRINE (PF) 0.25% -1:200000 IJ SOLN
INTRAMUSCULAR | Status: AC
  Filled 2023-12-26: qty 30

## 2023-12-26 MED ORDER — LIDOCAINE HCL (PF) 2 % IJ SOLN
INTRAMUSCULAR | Status: AC
Start: 2023-12-26 — End: ?
  Filled 2023-12-26: qty 5

## 2023-12-26 MED ORDER — TRAMADOL HCL 50 MG PO TABS: 50.0000 mg | ORAL_TABLET | Freq: Four times a day (QID) | ORAL | Status: DC | PRN

## 2023-12-26 MED ORDER — LACTATED RINGERS IR SOLN
Status: DC | PRN
  Administered 2023-12-26: 1000 mL

## 2023-12-26 MED ORDER — ALBUMIN HUMAN 5 % IV SOLN
INTRAVENOUS | Status: DC | PRN
Start: 2023-12-26 — End: 2023-12-26

## 2023-12-26 MED ORDER — DEXAMETHASONE SODIUM PHOSPHATE 10 MG/ML IJ SOLN
INTRAMUSCULAR | Status: DC | PRN
  Administered 2023-12-26: 8 mg via INTRAVENOUS

## 2023-12-26 MED ORDER — ACETAMINOPHEN 500 MG PO TABS
1000.0000 mg | ORAL_TABLET | ORAL | Status: AC
  Administered 2023-12-26: 1000 mg via ORAL
  Filled 2023-12-26: qty 2

## 2023-12-26 MED ORDER — OXYCODONE HCL 5 MG/5ML PO SOLN: 5.0000 mg | Freq: Once | ORAL | Status: DC | PRN

## 2023-12-26 MED ORDER — ROCURONIUM BROMIDE 10 MG/ML (PF) SYRINGE
PREFILLED_SYRINGE | INTRAVENOUS | Status: AC
  Filled 2023-12-26: qty 10

## 2023-12-26 MED ORDER — PROCHLORPERAZINE EDISYLATE 10 MG/2ML IJ SOLN: 5.0000 mg | Freq: Four times a day (QID) | INTRAMUSCULAR | Status: DC | PRN

## 2023-12-26 MED ORDER — RISPERIDONE 1 MG PO TABS
2.0000 mg | ORAL_TABLET | Freq: Every day | ORAL | Status: DC
  Administered 2023-12-26 – 2023-12-30 (×5): 2 mg via ORAL
  Filled 2023-12-26 (×5): qty 2

## 2023-12-26 MED ORDER — FENTANYL CITRATE (PF) 100 MCG/2ML IJ SOLN
INTRAMUSCULAR | Status: AC
  Filled 2023-12-26: qty 2

## 2023-12-26 MED ORDER — SODIUM CHLORIDE 0.9 % IV SOLN
2.0000 g | Freq: Two times a day (BID) | INTRAVENOUS | Status: AC
  Administered 2023-12-26: 2 g via INTRAVENOUS
  Filled 2023-12-26: qty 2

## 2023-12-26 MED ORDER — PHENYLEPHRINE 80 MCG/ML (10ML) SYRINGE FOR IV PUSH (FOR BLOOD PRESSURE SUPPORT)
PREFILLED_SYRINGE | INTRAVENOUS | Status: DC | PRN
  Administered 2023-12-26: 80 ug via INTRAVENOUS

## 2023-12-26 MED ORDER — ALVIMOPAN 12 MG PO CAPS
12.0000 mg | ORAL_CAPSULE | Freq: Two times a day (BID) | ORAL | Status: DC
  Administered 2023-12-27 – 2023-12-30 (×8): 12 mg via ORAL
  Filled 2023-12-26 (×8): qty 1

## 2023-12-26 MED ORDER — ONDANSETRON HCL 4 MG/2ML IJ SOLN
4.0000 mg | Freq: Four times a day (QID) | INTRAMUSCULAR | Status: DC | PRN
  Filled 2023-12-26: qty 2

## 2023-12-26 MED ORDER — AMISULPRIDE (ANTIEMETIC) 5 MG/2ML IV SOLN: 10.0000 mg | Freq: Once | INTRAVENOUS | Status: DC | PRN

## 2023-12-26 MED ORDER — PROCHLORPERAZINE MALEATE 10 MG PO TABS: 10.0000 mg | ORAL_TABLET | Freq: Four times a day (QID) | ORAL | Status: DC | PRN

## 2023-12-26 MED ORDER — GABAPENTIN 100 MG PO CAPS
200.0000 mg | ORAL_CAPSULE | Freq: Every day | ORAL | Status: DC
  Administered 2023-12-28 – 2023-12-30 (×3): 200 mg via ORAL
  Filled 2023-12-26 (×5): qty 2

## 2023-12-26 MED ORDER — PHENOL 1.4 % MT LIQD
2.0000 | OROMUCOSAL | Status: DC | PRN
Start: 2023-12-26 — End: 2023-12-31

## 2023-12-26 MED ORDER — 0.9 % SODIUM CHLORIDE (POUR BTL) OPTIME
TOPICAL | Status: DC | PRN
Start: 2023-12-26 — End: 2023-12-26
  Administered 2023-12-26: 1000 mL

## 2023-12-26 MED ORDER — ENSURE SURGERY PO LIQD
237.0000 mL | Freq: Two times a day (BID) | ORAL | Status: DC
  Administered 2023-12-27 – 2023-12-31 (×2): 237 mL via ORAL

## 2023-12-26 MED ORDER — GABAPENTIN 300 MG PO CAPS
300.0000 mg | ORAL_CAPSULE | ORAL | Status: AC
  Administered 2023-12-26: 300 mg via ORAL
  Filled 2023-12-26: qty 1

## 2023-12-26 MED ORDER — ORAL CARE MOUTH RINSE: 15.0000 mL | Freq: Once | OROMUCOSAL | Status: AC

## 2023-12-26 MED ORDER — MIDAZOLAM HCL 2 MG/2ML IJ SOLN
INTRAMUSCULAR | Status: AC
  Filled 2023-12-26: qty 2

## 2023-12-26 MED ORDER — PROPOFOL 10 MG/ML IV BOLUS
INTRAVENOUS | Status: DC | PRN
Start: 2023-12-26 — End: 2023-12-26
  Administered 2023-12-26: 150 mg via INTRAVENOUS

## 2023-12-26 MED ORDER — MENTHOL 3 MG MT LOZG: 1.0000 | LOZENGE | OROMUCOSAL | Status: DC | PRN

## 2023-12-26 MED ORDER — LACTATED RINGERS IV SOLN: INTRAVENOUS | Status: DC | PRN

## 2023-12-26 MED ORDER — OXYCODONE HCL 5 MG PO TABS: 5.0000 mg | ORAL_TABLET | Freq: Once | ORAL | Status: DC | PRN

## 2023-12-26 MED ORDER — DEXAMETHASONE SODIUM PHOSPHATE 10 MG/ML IJ SOLN
INTRAMUSCULAR | Status: AC
  Filled 2023-12-26: qty 1

## 2023-12-26 MED ORDER — CALCIUM POLYCARBOPHIL 625 MG PO TABS
625.0000 mg | ORAL_TABLET | Freq: Two times a day (BID) | ORAL | Status: DC
  Administered 2023-12-26 – 2023-12-29 (×7): 625 mg via ORAL
  Filled 2023-12-26 (×7): qty 1

## 2023-12-26 MED ORDER — SALINE SPRAY 0.65 % NA SOLN: 1.0000 | Freq: Four times a day (QID) | NASAL | Status: DC | PRN

## 2023-12-26 MED ORDER — PHENYLEPHRINE 80 MCG/ML (10ML) SYRINGE FOR IV PUSH (FOR BLOOD PRESSURE SUPPORT)
PREFILLED_SYRINGE | INTRAVENOUS | Status: AC
  Filled 2023-12-26: qty 10

## 2023-12-26 MED ORDER — CHLORHEXIDINE GLUCONATE CLOTH 2 % EX PADS: 6.0000 | MEDICATED_PAD | Freq: Once | CUTANEOUS | Status: DC

## 2023-12-26 MED ORDER — ALBUTEROL SULFATE HFA 108 (90 BASE) MCG/ACT IN AERS
INHALATION_SPRAY | RESPIRATORY_TRACT | Status: AC
  Filled 2023-12-26: qty 6.7

## 2023-12-26 MED ORDER — CHLORHEXIDINE GLUCONATE 0.12 % MT SOLN: 15.0000 mL | Freq: Once | OROMUCOSAL | Status: DC

## 2023-12-26 MED ORDER — ALBUTEROL SULFATE (2.5 MG/3ML) 0.083% IN NEBU
2.5000 mg | INHALATION_SOLUTION | Freq: Four times a day (QID) | RESPIRATORY_TRACT | Status: DC | PRN
  Administered 2023-12-28: 2.5 mg via RESPIRATORY_TRACT
  Filled 2023-12-26: qty 3

## 2023-12-26 MED ORDER — HYDROMORPHONE HCL 2 MG/ML IJ SOLN
INTRAMUSCULAR | Status: AC
  Filled 2023-12-26: qty 1

## 2023-12-26 MED ORDER — ACETAMINOPHEN 500 MG PO TABS
1000.0000 mg | ORAL_TABLET | Freq: Four times a day (QID) | ORAL | Status: DC
  Administered 2023-12-26 – 2023-12-30 (×12): 1000 mg via ORAL
  Filled 2023-12-26 (×12): qty 2

## 2023-12-26 MED ORDER — GLYCOPYRROLATE 0.2 MG/ML IJ SOLN
INTRAMUSCULAR | Status: AC
  Filled 2023-12-26: qty 1

## 2023-12-26 MED ORDER — DIVALPROEX SODIUM ER 500 MG PO TB24
1000.0000 mg | ORAL_TABLET | Freq: Every day | ORAL | Status: DC
  Administered 2023-12-26 – 2023-12-30 (×5): 1000 mg via ORAL
  Filled 2023-12-26 (×5): qty 2

## 2023-12-26 MED ORDER — ENOXAPARIN SODIUM 40 MG/0.4ML IJ SOSY
40.0000 mg | PREFILLED_SYRINGE | Freq: Once | INTRAMUSCULAR | Status: AC
Start: 2023-12-26 — End: 2023-12-26
  Administered 2023-12-26: 40 mg via SUBCUTANEOUS
  Filled 2023-12-26: qty 0.4

## 2023-12-26 MED ORDER — LISINOPRIL 20 MG PO TABS
40.0000 mg | ORAL_TABLET | Freq: Every day | ORAL | Status: DC
  Administered 2023-12-26: 40 mg via ORAL
  Filled 2023-12-26 (×2): qty 2

## 2023-12-26 MED ORDER — MELATONIN 3 MG PO TABS
3.0000 mg | ORAL_TABLET | Freq: Every evening | ORAL | Status: DC | PRN
  Administered 2023-12-29: 3 mg via ORAL
  Filled 2023-12-26: qty 1

## 2023-12-26 MED ORDER — KCL IN DEXTROSE-NACL 20-5-0.45 MEQ/L-%-% IV SOLN
INTRAVENOUS | Status: AC
  Filled 2023-12-26: qty 1000

## 2023-12-26 MED ORDER — SODIUM CHLORIDE 0.9 % IV SOLN
2.0000 g | INTRAVENOUS | Status: AC
  Administered 2023-12-26: 2 g via INTRAVENOUS
  Filled 2023-12-26: qty 2

## 2023-12-26 MED ORDER — KETAMINE HCL 50 MG/5ML IJ SOSY
PREFILLED_SYRINGE | INTRAMUSCULAR | Status: AC
  Filled 2023-12-26: qty 5

## 2023-12-26 MED ORDER — DIPHENHYDRAMINE HCL 12.5 MG/5ML PO ELIX: 12.5000 mg | ORAL_SOLUTION | Freq: Four times a day (QID) | ORAL | Status: DC | PRN

## 2023-12-26 MED ORDER — MAGIC MOUTHWASH: 15.0000 mL | Freq: Four times a day (QID) | ORAL | Status: DC | PRN

## 2023-12-26 MED ORDER — ROCURONIUM BROMIDE 100 MG/10ML IV SOLN
INTRAVENOUS | Status: DC | PRN
  Administered 2023-12-26: 10 mg via INTRAVENOUS
  Administered 2023-12-26: 5 mg via INTRAVENOUS
  Administered 2023-12-26: 50 mg via INTRAVENOUS

## 2023-12-26 MED ORDER — LIDOCAINE HCL (CARDIAC) PF 100 MG/5ML IV SOSY
PREFILLED_SYRINGE | INTRAVENOUS | Status: DC | PRN
  Administered 2023-12-26: 60 mg via INTRAVENOUS

## 2023-12-26 MED ORDER — HYDROMORPHONE HCL 1 MG/ML IJ SOLN
INTRAMUSCULAR | Status: DC | PRN
  Administered 2023-12-26: .4 mg via INTRAVENOUS

## 2023-12-26 MED ORDER — ONDANSETRON HCL 4 MG PO TABS: 4.0000 mg | ORAL_TABLET | Freq: Four times a day (QID) | ORAL | Status: DC | PRN

## 2023-12-26 MED ORDER — SIMETHICONE 80 MG PO CHEW: 40.0000 mg | CHEWABLE_TABLET | Freq: Four times a day (QID) | ORAL | Status: DC | PRN

## 2023-12-26 MED ORDER — CHLORHEXIDINE GLUCONATE CLOTH 2 % EX PADS
6.0000 | MEDICATED_PAD | Freq: Once | CUTANEOUS | Status: DC
Start: 2023-12-26 — End: 2023-12-26

## 2023-12-26 MED ORDER — ALVIMOPAN 12 MG PO CAPS
12.0000 mg | ORAL_CAPSULE | ORAL | Status: AC
  Administered 2023-12-26: 12 mg via ORAL
  Filled 2023-12-26: qty 1

## 2023-12-26 MED ORDER — SODIUM CHLORIDE 0.9% FLUSH
3.0000 mL | Freq: Two times a day (BID) | INTRAVENOUS | Status: DC
  Administered 2023-12-26 – 2023-12-31 (×8): 3 mL via INTRAVENOUS

## 2023-12-26 MED ORDER — BISACODYL 5 MG PO TBEC
20.0000 mg | DELAYED_RELEASE_TABLET | Freq: Once | ORAL | Status: DC
Start: 2023-12-26 — End: 2023-12-26

## 2023-12-26 MED ORDER — POLYETHYLENE GLYCOL 3350 17 GM/SCOOP PO POWD
238.0000 g | Freq: Once | ORAL | Status: DC
Start: 2023-12-26 — End: 2023-12-26

## 2023-12-26 MED ORDER — HYDROMORPHONE HCL 1 MG/ML IJ SOLN: 0.5000 mg | INTRAMUSCULAR | Status: DC | PRN

## 2023-12-26 MED ORDER — ENSURE PRE-SURGERY PO LIQD: 592.0000 mL | Freq: Once | ORAL | Status: DC

## 2023-12-26 MED ORDER — METOPROLOL TARTRATE 5 MG/5ML IV SOLN: 5.0000 mg | Freq: Four times a day (QID) | INTRAVENOUS | Status: DC | PRN

## 2023-12-26 MED ORDER — METRONIDAZOLE 500 MG PO TABS: 1000.0000 mg | ORAL_TABLET | ORAL | Status: DC

## 2023-12-26 SURGICAL SUPPLY — 87 items
BAG COUNTER SPONGE SURGICOUNT (BAG) ×3 IMPLANT
BLADE EXTENDED COATED 6.5IN (ELECTRODE) IMPLANT
CANNULA REDUCER 12-8 DVNC XI (CANNULA) IMPLANT
CELLS DAT CNTRL 66122 CELL SVR (MISCELLANEOUS) IMPLANT
CHLORAPREP W/TINT 26 (MISCELLANEOUS) IMPLANT
CLIP APPLIE 5 13 M/L LIGAMAX5 (MISCELLANEOUS) IMPLANT
CLIP APPLIE ROT 10 11.4 M/L (STAPLE) IMPLANT
COVER SURGICAL LIGHT HANDLE (MISCELLANEOUS) ×6 IMPLANT
COVER TIP SHEARS 8 DVNC (MISCELLANEOUS) ×3 IMPLANT
DEFOGGER SCOPE WARM SEASHARP (MISCELLANEOUS) IMPLANT
DEVICE TROCAR PUNCTURE CLOSURE (ENDOMECHANICALS) IMPLANT
DRAIN CHANNEL 19F RND (DRAIN) IMPLANT
DRAPE ARM DVNC X/XI (DISPOSABLE) ×12 IMPLANT
DRAPE COLUMN DVNC XI (DISPOSABLE) ×3 IMPLANT
DRAPE SURG IRRIG POUCH 19X23 (DRAPES) ×3 IMPLANT
DRIVER NDL LRG 8 DVNC XI (INSTRUMENTS) ×3 IMPLANT
DRIVER NDLE LRG 8 DVNC XI (INSTRUMENTS) IMPLANT
DRSG OPSITE POSTOP 4X10 (GAUZE/BANDAGES/DRESSINGS) IMPLANT
DRSG OPSITE POSTOP 4X6 (GAUZE/BANDAGES/DRESSINGS) IMPLANT
DRSG OPSITE POSTOP 4X8 (GAUZE/BANDAGES/DRESSINGS) IMPLANT
DRSG TEGADERM 2-3/8X2-3/4 SM (GAUZE/BANDAGES/DRESSINGS) ×15 IMPLANT
DRSG TEGADERM 4X4.75 (GAUZE/BANDAGES/DRESSINGS) IMPLANT
ELECT PENCIL ROCKER SW 15FT (MISCELLANEOUS) ×3 IMPLANT
ELECT REM PT RETURN 15FT ADLT (MISCELLANEOUS) ×3 IMPLANT
ENDOLOOP SUT PDS II 0 18 (SUTURE) IMPLANT
EVACUATOR SILICONE 100CC (DRAIN) IMPLANT
FORCEPS BPLR FENES DVNC XI (FORCEP) IMPLANT
GAUZE SPONGE 2X2 8PLY STRL LF (GAUZE/BANDAGES/DRESSINGS) ×3 IMPLANT
GLOVE ECLIPSE 8.0 STRL XLNG CF (GLOVE) ×9 IMPLANT
GLOVE INDICATOR 8.0 STRL GRN (GLOVE) ×9 IMPLANT
GOWN SRG XL LVL 4 BRTHBL STRL (GOWNS) ×3 IMPLANT
GOWN STRL REUS W/ TWL XL LVL3 (GOWN DISPOSABLE) ×12 IMPLANT
GRASPER SUT TROCAR 14GX15 (MISCELLANEOUS) IMPLANT
GRASPER TIP-UP FEN DVNC XI (INSTRUMENTS) ×3 IMPLANT
HOLDER FOLEY CATH W/STRAP (MISCELLANEOUS) ×3 IMPLANT
IRRIGATION SUCT STRKRFLW 2 WTP (MISCELLANEOUS) ×3 IMPLANT
KIT PROCEDURE DVNC SI (MISCELLANEOUS) IMPLANT
KIT SIGMOIDOSCOPE (SET/KITS/TRAYS/PACK) IMPLANT
KIT TURNOVER KIT A (KITS) IMPLANT
NDL INSUFFLATION 14GA 120MM (NEEDLE) ×3 IMPLANT
NEEDLE INSUFFLATION 14GA 120MM (NEEDLE) ×2 IMPLANT
PACK CARDIOVASCULAR III (CUSTOM PROCEDURE TRAY) ×3 IMPLANT
PACK COLON (CUSTOM PROCEDURE TRAY) ×3 IMPLANT
PAD POSITIONING PINK XL (MISCELLANEOUS) ×3 IMPLANT
PROTECTOR NERVE ULNAR (MISCELLANEOUS) ×6 IMPLANT
RELOAD STAPLE 45 3.5 BLU DVNC (STAPLE) IMPLANT
RELOAD STAPLE 45 4.3 GRN DVNC (STAPLE) IMPLANT
RELOAD STAPLE 60 3.5 BLU DVNC (STAPLE) IMPLANT
RELOAD STAPLE 60 4.3 GRN DVNC (STAPLE) IMPLANT
RELOAD STAPLER 3.5X45 BLU DVNC (STAPLE) IMPLANT
RELOAD STAPLER 3.5X60 BLU DVNC (STAPLE) ×2 IMPLANT
RELOAD STAPLER 4.3X45 GRN DVNC (STAPLE) IMPLANT
RELOAD STAPLER 4.3X60 GRN DVNC (STAPLE) ×2 IMPLANT
RETRACTOR WND ALEXIS 18 MED (MISCELLANEOUS) IMPLANT
SCISSORS LAP 5X35 DISP (ENDOMECHANICALS) ×3 IMPLANT
SCISSORS MNPLR CVD DVNC XI (INSTRUMENTS) ×3 IMPLANT
SEAL UNIV 5-12 XI (MISCELLANEOUS) ×12 IMPLANT
SEALER VESSEL EXT DVNC XI (MISCELLANEOUS) ×3 IMPLANT
SOLUTION ELECTROSURG ANTI STCK (MISCELLANEOUS) ×3 IMPLANT
SPIKE FLUID TRANSFER (MISCELLANEOUS) ×3 IMPLANT
STAPLER 45 SUREFORM DVNC (STAPLE) IMPLANT
STAPLER 60 SUREFORM DVNC (STAPLE) IMPLANT
STAPLER ECHELON POWER CIR 29 (STAPLE) IMPLANT
STAPLER ECHELON POWER CIR 31 (STAPLE) IMPLANT
STOPCOCK 4 WAY LG BORE MALE ST (IV SETS) ×6 IMPLANT
SURGILUBE 2OZ TUBE FLIPTOP (MISCELLANEOUS) IMPLANT
SUT ETHILON 2 0 PS N (SUTURE) IMPLANT
SUT MNCRL AB 4-0 PS2 18 (SUTURE) ×3 IMPLANT
SUT PDS AB 1 CT1 27 (SUTURE) ×6 IMPLANT
SUT PROLENE 0 CT 2 (SUTURE) IMPLANT
SUT PROLENE 2 0 KS (SUTURE) IMPLANT
SUT PROLENE 2 0 SH DA (SUTURE) IMPLANT
SUT SILK 2 0 SH CR/8 (SUTURE) IMPLANT
SUT SILK 3 0 SH CR/8 (SUTURE) ×3 IMPLANT
SUT VIC AB 2-0 SH 18 (SUTURE) IMPLANT
SUT VIC AB 3-0 SH 18 (SUTURE) IMPLANT
SUT VIC AB 3-0 SH 27XBRD (SUTURE) IMPLANT
SUT VICRYL 0 UR6 27IN ABS (SUTURE) IMPLANT
SUTURE V-LC BRB 180 2/0GR6GS22 (SUTURE) IMPLANT
SUTURE VLOC BRB 180 ABS3/0GR12 (SUTURE) IMPLANT
SYR 20ML ECCENTRIC (SYRINGE) ×3 IMPLANT
SYSTEM LAPSCP GELPORT 120MM (MISCELLANEOUS) IMPLANT
SYSTEM WOUND ALEXIS 18CM MED (MISCELLANEOUS) ×3 IMPLANT
TRAY FOLEY MTR SLVR 16FR STAT (SET/KITS/TRAYS/PACK) ×3 IMPLANT
TROCAR ADV FIXATION 5X100MM (TROCAR) ×3 IMPLANT
TUBING CONNECTING 10 (TUBING) ×6 IMPLANT
TUBING INSUFFLATION 10FT LAP (TUBING) ×3 IMPLANT

## 2023-12-26 NOTE — Anesthesia Procedure Notes (Signed)
 Procedure Name: Intubation Date/Time: 12/26/2023 1:32 PM  Performed by: Norvell Beers, CRNAPre-anesthesia Checklist: Patient identified, Emergency Drugs available, Suction available and Patient being monitored Patient Re-evaluated:Patient Re-evaluated prior to induction Oxygen Delivery Method: Circle system utilized Preoxygenation: Pre-oxygenation with 100% oxygen Induction Type: IV induction Ventilation: Mask ventilation without difficulty Laryngoscope Size: Mac and 4 Grade View: Grade I Tube type: Oral Tube size: 7.5 mm Number of attempts: 1 Airway Equipment and Method: Stylet and Video-laryngoscopy Placement Confirmation: ETT inserted through vocal cords under direct vision, positive ETCO2 and breath sounds checked- equal and bilateral Secured at: 24 cm Tube secured with: Tape Dental Injury: Teeth and Oropharynx as per pre-operative assessment  Comments: Cords clear, atraumatic.

## 2023-12-26 NOTE — Transfer of Care (Signed)
 Immediate Anesthesia Transfer of Care Note  Patient: Bradley [name]  Procedure(s) Performed: ROBOTIC RECTOSIGMOID RESECTION (LAR) ; TAKEDOWN OF END DESCENDING COLOSTOMY WITH ANASTOMOSIS  ;ROBOTIC LYSIS OF ADHESIONS  TRANSVERSUS ABDOMINIS PLANE (TAP) BLOCK - BILATERAL REPAIR, SMALL INTESTINE SIGMOIDOSCOPY, FLEXIBLE APPENDECTOMY, ROBOT-ASSISTED, LAPAROSCOPIC  Patient Location: PACU  Anesthesia Type:General  Level of Consciousness: awake and drowsy  Airway & Oxygen Therapy: Patient Spontanous Breathing and Patient connected to face mask oxygen  Post-op Assessment: Report given to RN and Post -op Vital signs reviewed and stable  Post vital signs: Reviewed and stable  Last Vitals:  Vitals Value Taken Time  BP 151/72 12/26/23 1730  Temp    Pulse 71 12/26/23 1733  Resp 18 12/26/23 1733  SpO2 98 % 12/26/23 1733  Vitals shown include unfiled device data.  Last Pain:  Vitals:   12/26/23 1014  TempSrc:   PainSc: 0-No pain         Complications: No notable events documented.

## 2023-12-26 NOTE — Telephone Encounter (Signed)
  Follow up Call-     12/25/2023    2:23 PM  Call back number  Post procedure Call Back phone  # [phone]  Permission to leave phone message Yes     Patient questions:  Do you have a fever, pain , or abdominal swelling? No. Pain Score  0 *  Have you tolerated food without any problems? Yes.    Have you been able to return to your normal activities? Yes.    Do you have any questions about your discharge instructions: Diet   No. Medications  No. Follow up visit  No.  Do you have questions or concerns about your Care? No.  Actions: * If pain score is 4 or above: No action needed, pain <4.

## 2023-12-26 NOTE — H&P (Signed)
 12/26/2023   PROVIDER: Girtha Lama, MD  Patient Care Team: Medicine, [facility] as PCP - Bradley Bradley [name], Bradley Kinds, MD as Consulting Provider ([facility]) Bradley Grange., MD (Gastroenterology) Bradley [name], [place] (Family Medicine)  [facility] MRN: [id number] DOB: [birth year] DATE OF ENCOUNTER: 08/06/2023  Interval History:   The patient returns to the office after undergoing open Hartmann resection for descending colon diverticularstricture causing massive bowel obstruction on 06/14/2023  Pathology: Segment of colon with diverticulosis and focal submucosal acute and chronic inflammation. Polypoid colonic mucosa x 6 with hyperplastic changes. Serrated features and reactive changes. 8 benign lymph nodes. Negative for metastatic carcinoma. Negative for dysplasia or malignancy.  Patient had to be rescheduled due to poor compliance with bowel prep despite numerous instructions.  Was able to get colonoscopy yesterday.  Concern for polyp at appendiceal orifice request made for concomitant appendectomy/partial cecectomy.  PRIOR VISIT 07/10/2023 Patient returns for his first postoperative follow-up. He was admitted in late October for abdominal distention with obstipation and kidney failure. Was hydrated. Gastrology was consulted. CT scan showed colon dilation and transition point descending colon. Concern for possible mass. He seemed open up someone so they tried to do a slow prep and scope him. He found a stricture in the descending colon that appeared to be benign. Patient felt worse and more distended. Underwent operative resection. He was so distended I could not do laparoscopically he is completely obstructed. End of doing a Hartmann resection of his left colon with end colostomy. He did have staying another week and a half eventually opening up. Rather deconditioned and confused. Recommendation made by therapies and primary service to go to skilled skilled  facility.  Patient has been residing at [facility] since his discharged in early November. Comes in a wheelchair. There is no one else with him. He notes he lives in [place] by himself. He thinks the bag is staying on for a week. Does not know how often he empties the bag but it sounds like only once or twice a day.    Labs, Imaging and Diagnostic Testing:  Located in 'Care Everywhere' section of Epic EMR chart  PRIOR [facility] CLINIC NOTES:  Located in 'Care Everywhere' section of Epic EMR chart  SURGERY NOTES:  Located in 'Care Everywhere' section of Epic EMR chart  PATHOLOGY:  Located in 'Care Everywhere' section of Epic EMR chart  Physical Examination:   There is no height or weight on file to calculate BMI.  Constitutional: Not cachectic. Alert and smiling. Moving without any difficulty. Hygiene improved. No foul odor. Definite smell of tobacco smoke Eyes: Normal extraocular movements. Sclera nonicteric Neuro: No major focal sensory defects. No major motor deficits. Psych: No severe agitation. No severe anxiety. Judgment & insight borderline., Oriented x3, HENT: Normocephalic, Mucus membranes moist. No thrush.  Neck: Supple, No tracheal deviation.  Chest: Good respiratory excursion. No audible wheezing CV: No major extremity edema Ext: No obvious deformity or contracture. Edema: not present. No cyanosis Skin: Warm and dry Musculoskeletal: Mobility: In wheelchair moves with minimal assistance  Abdomen: Midline incision 98% closed with normal healing ridge. Two periumbilical 3 mm dots noted that I probed - less than a centimeter and cauterized with silver nitrate. Colostomy on left side abdomen with thick stool in bag. No leaking or irritation Colostomy pink with soft stool in bag. Obese Nontender. Soft. Nondistended.   Gen: Inguinal hernia: Not present. Inguinal lymph nodes: without lymphadenopathy.   Rectal: (Deferred)  Assessment and Plan:   Bradley [name] is a 73 y.o. male recovering s/p Hartmann resection for descending colon diverticular stricture and end colostomy 06/14/2023.  Pathology consistent with diverticulitis no cancer.  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 Reasonable candidate for robotic minimally invasive approach. This stricture was in the descending colon, so very likely he will need splenic flexure mobilization. I do not feel strongly he needs firefly preoperatively.  The anatomy & physiology of the digestive tract was discussed. The pathophysiology was discussed. Possibility of remaining with an ostomy permanently was discussed. I offered ostomy takedown. Minimally invasive robotic/laparoscopic & open techniques were discussed.   Risks such as bleeding, infection, abscess, leak, reoperation, possible re-ostomy, injury to other organs, need for repair of tissues / organs, need for further treatment, hernia, heart attack, death, and other risks were discussed. I noted a good likelihood this will help address the problem. Goals of post-operative recovery were discussed as well. We will work to minimize complications. Questions were answered. The patient expresses understanding & wishes to proceed with surgery.  Colonoscopy noted polyps.  One at the appendiceal orifice not to be safely removed.  Request made for appendectomy/partial cecectomy by Dr. General [name] his gastroenterologist to get negative margins.  Will add that to the procedure as well     Bradley Goodie, MD, FACS, MASCRS Esophageal, Gastrointestinal & Colorectal Surgery Robotic and Minimally Invasive Surgery  [facility] [address], [address] [place], [place] [address] [phone] Fax [phone] Main  CONTACT INFORMATION: Weekday (9AM-5PM): Call [facility] main office at [phone] Weeknight (5PM-9AM) or Weekend/Holiday: Check EPIC "Web Links" tab & use "AMION" (password " [email or web address]") for [facility] coverage  Please, DO NOT use SecureChat  (it is not reliable communication to reach operating surgeons & will lead to a delay in care).   Epic staff messaging available for outptient concerns needing 1-2 business day response.

## 2023-12-26 NOTE — Discharge Instructions (Addendum)
 SURGERY: POST OP INSTRUCTIONS (Surgery for small bowel obstruction, colon resection, etc)   ######################################################################  EAT Gradually transition to a high fiber diet with a fiber supplement over the next few days after discharge  WALK Walk an hour a day.  Control your pain to do that.    CONTROL PAIN Control pain so that you can walk, sleep, tolerate sneezing/coughing, go up/down stairs.  HAVE A BOWEL MOVEMENT DAILY Keep your bowels regular to avoid problems.  OK to try a laxative to override constipation.  OK to use an antidairrheal to slow down diarrhea.  Call if not better after 2 tries  CALL IF YOU HAVE PROBLEMS/CONCERNS Call if you are still struggling despite following these instructions. Call if you have concerns not answered by these instructions  ######################################################################   DIET Follow a light diet the first few days at home.  Start with a bland diet such as soups, liquids, starchy foods, low fat foods, etc.  If you feel full, bloated, or constipated, stay on a ful liquid or pureed/blenderized diet for a few days until you feel better and no longer constipated. Be sure to drink plenty of fluids every day to avoid getting dehydrated (feeling dizzy, not urinating, etc.). Gradually add a fiber supplement to your diet over the next week.  Gradually get back to a regular solid diet.  Avoid fast food or heavy meals the first week as you are more likely to get nauseated. It is expected for your digestive tract to need a few months to get back to normal.  It is common for your bowel movements and stools to be irregular.  You will have occasional bloating and cramping that should eventually fade away.  Until you are eating solid food normally, off all pain medications, and back to regular activities; your bowels will not be normal. Focus on eating a low-fat, high fiber diet the rest of your life  (See Getting to Good Bowel Health, below).  CARE of your INCISION or WOUND  It is good for closed incisions and even open wounds to be washed every day.  Shower every day.  Short baths are fine.  Wash the incisions and wounds clean with soap & water .    You may leave closed incisions open to air if it is dry.   You may cover the incision with clean gauze & replace it after your daily shower for comfort.  TEGADERM & WICKS:  You have clear gauze band-aid dressings over your closed incision(s).  Remove the dressings & shoelace ribbon wicks in your largest incision 2 days after surgery = 5/10.    If you have an open wound with a wound vac, see wound vac care instructions.    ACTIVITIES as tolerated Start light daily activities --- self-care, walking, climbing stairs-- beginning the day after surgery.  Gradually increase activities as tolerated.  Control your pain to be active.  Stop when you are tired.  Ideally, walk several times a day, eventually an hour a day.   Most people are back to most day-to-day activities in a few weeks.  It takes 4-8 weeks to get back to unrestricted, intense activity. If you can walk 30 minutes without difficulty, it is safe to try more intense activity such as jogging, treadmill, bicycling, low-impact aerobics, swimming, etc. Save the most intensive and strenuous activity for last (Usually 4-8 weeks after surgery) such as sit-ups, heavy lifting, contact sports, etc.  Refrain from any intense heavy lifting or straining until you are  off narcotics for pain control.  You will have off days, but things should improve week-by-week. DO NOT PUSH THROUGH PAIN.  Let pain be your guide: If it hurts to do something, don't do it.  Pain is your body warning you to avoid that activity for another week until the pain goes down. You may drive when you are no longer taking narcotic prescription pain medication, you can comfortably wear a seatbelt, and you can safely make sudden  turns/stops to protect yourself without hesitating due to pain. You may have sexual intercourse when it is comfortable. If it hurts to do something, stop.   MEDICATIONS Take your usually prescribed home medications unless otherwise directed.    Blood thinners:  You can restart any strong blood thinners after the second postoperative day  for example: COUMADIN (warfarin), XERELTO (rivaroxaban), ELIQUIS (apixaban), PLAVIX (clopidigrel), BRILINTA (ticagrelor), EFFIENT (prasugrel), PRADAXA (dabigatran), etc  Continue aspirin before & after surgery..     Some oozing/bleeding the first 1-2 weeks is common but should taper down & be small volume.    If you are passing many large clots or having uncontrolling bleeding, call your surgeon    PAIN CONTROL Pain after surgery or related to activity is often due to strain/injury to muscle, tendon, nerves and/or incisions.  This pain is usually short-term and will improve in a few months.  To help speed the process of healing and to get back to regular activity more quickly, DO THE FOLLOWING THINGS TOGETHER: Increase activity gradually.  DO NOT PUSH THROUGH PAIN Use Ice and/or Heat Try Gentle Massage and/or Stretching Take over the counter pain medication Take Narcotic prescription pain medication for more severe pain  Good pain control = faster recovery.  It is better to take more medicine to be more active than to stay in bed all day to avoid medications.  Increase activity gradually Avoid heavy lifting at first, then increase to lifting as tolerated over the next 6 weeks. Do not "push through" the pain.  Listen to your body and avoid positions and maneuvers than reproduce the pain.  Wait a few days before trying something more intense Walking an hour a day is encouraged to help your body recover faster and more safely.  Start slowly and stop when getting sore.  If you can walk 30 minutes without stopping or pain, you can try more intense activity  (running, jogging, aerobics, cycling, swimming, treadmill, sex, sports, weightlifting, etc.) Remember: If it hurts to do it, then don't do it! Use Ice and/or Heat You will have swelling and bruising around the incisions.  This will take several weeks to resolve. Ice packs or heating pads (6-8 times a day, 30-60 minutes at a time) will help sooth soreness & bruising. Some people prefer to use ice alone, heat alone, or alternate between ice & heat.  Experiment and see what works best for you.  Consider trying ice for the first few days to help decrease swelling and bruising; then, switch to heat to help relax sore spots and speed recovery. Shower every day.  Short baths are fine.  It feels good!  Keep the incisions and wounds clean with soap & water .   Try Gentle Massage and/or Stretching Massage at the area of pain many times a day Stop if you feel pain - do not overdo it Take over the counter pain medication This helps the muscle and nerve tissues become less irritable and calm down faster Choose ONE of the following over-the-counter anti-inflammatory  medications: Acetaminophen  500mg  tabs (Tylenol ) 1-2 pills with every meal and just before bedtime (avoid if you have liver problems or if you have acetaminophen  in you narcotic prescription) Naproxen  220mg  tabs (ex. Aleve , Naprosyn ) 1-2 pills twice a day (avoid if you have kidney, stomach, IBD, or bleeding problems) Ibuprofen  200mg  tabs (ex. Advil , Motrin ) 3-4 pills with every meal and just before bedtime (avoid if you have kidney, stomach, IBD, or bleeding problems) Take with food/snack several times a day as directed for at least 2 weeks to help keep pain / soreness down & more manageable. Take Narcotic prescription pain medication for more severe pain A prescription for strong pain control is often given to you upon discharge (for example: oxycodone /Percocet, hydrocodone /Norco/Vicodin, or tramadol /Ultram ) Take your pain medication as  prescribed. Be mindful that most narcotic prescriptions contain Tylenol  (acetaminophen ) as well - avoid taking too much Tylenol . If you are having problems/concerns with the prescription medicine (does not control pain, nausea, vomiting, rash, itching, etc.), please call us  [phone] to see if we need to switch you to a different pain medicine that will work better for you and/or control your side effects better. If you need a refill on your pain medication, you must call the office before 4 pm and on weekdays only.  By federal law, prescriptions for narcotics cannot be called into a pharmacy.  They must be filled out on paper & picked up from our office by the patient or authorized caretaker.  Prescriptions cannot be filled after 4 pm nor on weekends.    WHEN TO CALL US  ([phone] Severe uncontrolled or worsening pain  Fever over 101 F (38.5 C) Concerns with the incision: Worsening pain, redness, rash/hives, swelling, bleeding, or drainage Reactions / problems with new medications (itching, rash, hives, nausea, etc.) Nausea and/or vomiting Difficulty urinating Difficulty breathing Worsening fatigue, dizziness, lightheadedness, blurred vision Other concerns If you are not getting better after two weeks or are noticing you are getting worse, contact our office ([phone] for further advice.  We may need to adjust your medications, re-evaluate you in the office, send you to the emergency room, or see what other things we can do to help. The clinic staff is available to answer your questions during regular business hours (8:30am-5pm).  Please don't hesitate to call and ask to speak to one of our nurses for clinical concerns.    A surgeon from [facility] is always on call at the hospitals 24 hours/day If you have a medical emergency, go to the nearest emergency room or call 911.  FOLLOW UP in our office One the day of your discharge from the hospital (or the next business  weekday), please call [facility] to set up or confirm an appointment to see your surgeon in the office for a follow-up appointment.  Usually it is 2-3 weeks after your surgery.   If you have skin staples at your incision(s), let the office know so we can set up a time in the office for the nurse to remove them (usually around 10 days after surgery). Make sure that you call for appointments the day of discharge (or the next business weekday) from the hospital to ensure a convenient appointment time. IF YOU HAVE DISABILITY OR FAMILY LEAVE FORMS, BRING THEM TO THE OFFICE FOR PROCESSING.  DO NOT GIVE THEM TO YOUR DOCTOR.  [facility] [address], [place], [place]  [address] ? [phone] - Main [phone] -  Toll Free,  [phone] - Fax [email or web address]    GETTING TO GOOD BOWEL HEALTH. It is expected for your digestive tract to need a few months to get back to normal.  It is common for your bowel movements and stools to be irregular.  You will have occasional bloating and cramping that should eventually fade away.  Until you are eating solid food normally, off all pain medications, and back to regular activities; your bowels will not be normal.   Avoiding constipation The goal: ONE SOFT BOWEL MOVEMENT A DAY!    Drink plenty of fluids.  Choose water  first. TAKE A FIBER SUPPLEMENT EVERY DAY THE REST OF YOUR LIFE During your first week back home, gradually add back a fiber supplement every day Experiment which form you can tolerate.   There are many forms such as powders, tablets, wafers, gummies, etc Psyllium bran (Metamucil), methylcellulose (Citrucel), Miralax  or Glycolax , Benefiber, Flax Seed.  Adjust the dose week-by-week (1/2 dose/day to 6 doses a day) until you are moving your bowels 1-2 times a day.  Cut back the dose or try a different fiber product if it is giving you problems such as diarrhea or bloating. Sometimes a  laxative is needed to help jump-start bowels if constipated until the fiber supplement can help regulate your bowels.  If you are tolerating eating & you are farting, it is okay to try a gentle laxative such as double dose MiraLax , prune juice, or Milk of Magnesia.  Avoid using laxatives too often. Stool softeners can sometimes help counteract the constipating effects of narcotic pain medicines.  It can also cause diarrhea, so avoid using for too long. If you are still constipated despite taking fiber daily, eating solids, and a few doses of laxatives, call our office. Controlling diarrhea Try drinking liquids and eating bland foods for a few days to avoid stressing your intestines further. Avoid dairy products (especially milk & ice cream) for a short time.  The intestines often can lose the ability to digest lactose when stressed. Avoid foods that cause gassiness or bloating.  Typical foods include beans and other legumes, cabbage, broccoli, and dairy foods.  Avoid greasy, spicy, fast foods.  Every person has some sensitivity to other foods, so listen to your body and avoid those foods that trigger problems for you. Probiotics (such as active yogurt, Align, etc) may help repopulate the intestines and colon with normal bacteria and calm down a sensitive digestive tract Adding a fiber supplement gradually can help thicken stools by absorbing excess fluid and retrain the intestines to act more normally.  Slowly increase the dose over a few weeks.  Too much fiber too soon can backfire and cause cramping & bloating. It is okay to try and slow down diarrhea with a few doses of antidiarrheal medicines.   Bismuth subsalicylate (ex. Kayopectate, Pepto Bismol) for a few doses can help control diarrhea.  Avoid if pregnant.   Loperamide (Imodium) can slow down diarrhea.  Start with one tablet (2mg ) first.  Avoid if you are having fevers or severe pain.  ILEOSTOMY PATIENTS WILL HAVE CHRONIC DIARRHEA since their  colon is not in use.    Drink plenty of liquids.  You will need to drink even more glasses of water /liquid a day to avoid getting dehydrated. Record output from your ileostomy.  Expect to empty the bag every 3-4 hours at first.  Most people with a permanent ileostomy empty their bag 4-6 times at the least.   Use antidiarrheal  medicine (especially Imodium) several times a day to avoid getting dehydrated.  Start with a dose at bedtime & breakfast.  Adjust up or down as needed.  Increase antidiarrheal medications as directed to avoid emptying the bag more than 8 times a day (every 3 hours). Work with your wound ostomy nurse to learn care for your ostomy.  See ostomy care instructions. TROUBLESHOOTING IRREGULAR BOWELS 1) Start with a soft & bland diet. No spicy, greasy, or fried foods.  2) Avoid gluten/wheat or dairy products from diet to see if symptoms improve. 3) Miralax  17gm or flax seed mixed in 8oz. water  or juice-daily. May use 2-4 times a day as needed. 4) Gas-X, Phazyme, etc. as needed for gas & bloating.  5) Prilosec (omeprazole) over-the-counter as needed 6)  Consider probiotics (Align, Activa, etc) to help calm the bowels down  Call your doctor if you are getting worse or not getting better.  Sometimes further testing (cultures, endoscopy, X-ray studies, CT scans, bloodwork, etc.) may be needed to help diagnose and treat the cause of the diarrhea. [facility] [address], [address], [place], [place]  [address] [phone] - Main.    [phone]  - Toll Free.   [phone] - Fax [email or web address]   STOP SMOKING!  We strongly recommend that you stop smoking.  Smoking increases the risk of surgery including infection in the form of an open wound, pus formation, abscess, hernia at an incision on the abdomen, etc.  You have an increased risk of other MAJOR complications such as stroke, heart attack, forming clots in the leg and/or lungs, and  death.    Smoking Cessation Quitting smoking is important to your health and has many advantages. However, it is not always easy to quit since nicotine  is a very addictive drug. Often times, people try 3 times or more before being able to quit. This document explains the best ways for you to prepare to quit smoking. Quitting takes hard work and a lot of effort, but you can do it. ADVANTAGES OF QUITTING SMOKING You will live longer, feel better, and live better. Your body will feel the impact of quitting smoking almost immediately. Within 20 minutes, blood pressure decreases. Your pulse returns to its normal level. After 8 hours, carbon monoxide levels in the blood return to normal. Your oxygen level increases. After 24 hours, the chance of having a heart attack starts to decrease. Your breath, hair, and body stop smelling like smoke. After 48 hours, damaged nerve endings begin to recover. Your sense of taste and smell improve. After 72 hours, the body is virtually free of nicotine . Your bronchial tubes relax and breathing becomes easier. After 2 to 12 weeks, lungs can hold more air. Exercise becomes easier and circulation improves. The risk of having a heart attack, stroke, cancer, or lung disease is greatly reduced. After 1 year, the risk of coronary heart disease is cut in half. After 5 years, the risk of stroke falls to the same as a nonsmoker. After 10 years, the risk of lung cancer is cut in half and the risk of other cancers decreases significantly. After 15 years, the risk of coronary heart disease drops, usually to the level of a nonsmoker. If you are pregnant, quitting smoking will improve your chances of having a healthy baby. The people you live with, especially any children, will be healthier. You will have extra money to spend on things other than cigarettes. QUESTIONS TO THINK ABOUT BEFORE ATTEMPTING TO  QUIT You may want to talk about your answers with your caregiver. Why do you  want to quit? If you tried to quit in the past, what helped and what did not? What will be the most difficult situations for you after you quit? How will you plan to handle them? Who can help you through the tough times? Your family? Friends? A caregiver? What pleasures do you get from smoking? What ways can you still get pleasure if you quit? Here are some questions to ask your caregiver: How can you help me to be successful at quitting? What medicine do you think would be best for me and how should I take it? What should I do if I need more help? What is smoking withdrawal like? How can I get information on withdrawal? GET READY Set a quit date. Change your environment by getting rid of all cigarettes, ashtrays, matches, and lighters in your home, car, or work. Do not let people smoke in your home. Review your past attempts to quit. Think about what worked and what did not. GET SUPPORT AND ENCOURAGEMENT You have a better chance of being successful if you have help. You can get support in many ways. Tell your family, friends, and co-workers that you are going to quit and need their support. Ask them not to smoke around you. Get individual, group, or telephone counseling and support. Programs are available at [employer] and health centers. Call your local health department for information about programs in your area. Spiritual beliefs and practices may help some smokers quit. Download a "quit meter" on your computer to keep track of quit statistics, such as how long you have gone without smoking, cigarettes not smoked, and money saved. Get a self-help book about quitting smoking and staying off of tobacco. LEARN NEW SKILLS AND BEHAVIORS Distract yourself from urges to smoke. Talk to someone, go for a walk, or occupy your time with a task. Change your normal routine. Take a different route to work. Drink tea instead of coffee. Eat breakfast in a different place. Reduce your stress. Take a  hot bath, exercise, or read a book. Plan something enjoyable to do every day. Reward yourself for not smoking. Explore interactive web-based programs that specialize in helping you quit. GET MEDICINE AND USE IT CORRECTLY Medicines can help you stop smoking and decrease the urge to smoke. Combining medicine with the above behavioral methods and support can greatly increase your chances of successfully quitting smoking. Nicotine  replacement therapy helps deliver nicotine  to your body without the negative effects and risks of smoking. Nicotine  replacement therapy includes nicotine  gum, lozenges, inhalers, nasal sprays, and skin patches. Some may be available over-the-counter and others require a prescription. Antidepressant medicine helps people abstain from smoking, but how this works is unknown. This medicine is available by prescription. Nicotinic receptor partial agonist medicine simulates the effect of nicotine  in your brain. This medicine is available by prescription. Ask your caregiver for advice about which medicines to use and how to use them based on your health history. Your caregiver will tell you what side effects to look out for if you choose to be on a medicine or therapy. Carefully read the information on the package. Do not use any other product containing nicotine  while using a nicotine  replacement product.  RELAPSE OR DIFFICULT SITUATIONS Most relapses occur within the first 3 months after quitting. Do not be discouraged if you start smoking again. Remember, most people try several times before finally quitting. You  may have symptoms of withdrawal because your body is used to nicotine . You may crave cigarettes, be irritable, feel very hungry, cough often, get headaches, or have difficulty concentrating. The withdrawal symptoms are only temporary. They are strongest when you first quit, but they will go away within 10 14 days. To reduce the chances of relapse, try to: Avoid drinking  alcohol. Drinking lowers your chances of successfully quitting. Reduce the amount of caffeine you consume. Once you quit smoking, the amount of caffeine in your body increases and can give you symptoms, such as a rapid heartbeat, sweating, and anxiety. Avoid smokers because they can make you want to smoke. Do not let weight gain distract you. Many smokers will gain weight when they quit, usually less than 10 pounds. Eat a healthy diet and stay active. You can always lose the weight gained after you quit. Find ways to improve your mood other than smoking. FOR MORE INFORMATION  www.smokefree.gov    While it can be one of the most difficult things to do, the [place] community has programs to help you stop.  Consider talking with your primary care physician about options.  Also, Smoking Cessation classes are available through the [facility]:  The smoking cessation program is a proven-effective program from the American Lung Association. The program is available for anyone 72 and older who currently smokes. The program lasts for 7 weeks and is 8 sessions. Each class will be approximately 1 1/2 hours. The program is every Tuesday.  All classes are 12-1:30pm and same location.  Event Location Information:  Location: [facility] 2nd Floor Conference Room 2-037; located next to [facility] cross streets: [facility] Entrance into the [facility] is adjacent to the [facility] main entrance. The conference room is located on the 2nd floor.  Parking Instructions: Visitor parking is adjacent to [employer] main entrance and the Cancer Center   A smoking cessation program is also offered through the [facility]. Register online at [email or web address] or call [phone] for more information.  Tobacco cessation counseling is available at [facility]. Call [phone] for a free appointment.   Tobacco cessation classes also are available through the [facility] in [place]. For information, call [phone].  The Patient Education Network features videos on tobacco cessation. Please consult your listings in the center of this book to find instructions on how to access this resource.  If you want more information, ask your nurse.

## 2023-12-26 NOTE — Anesthesia Preprocedure Evaluation (Addendum)
 Anesthesia Evaluation  Patient identified by MRN, date of birth, ID band Patient awake    Reviewed: Allergy & Precautions, NPO status , Patient's Chart, lab work & pertinent test results  Airway Mallampati: II  TM Distance: >3 FB Neck ROM: Full    Dental  (+) Missing, Dental Advisory Given,    Pulmonary Current Smoker and Patient abstained from smoking.   Pulmonary exam normal breath sounds clear to auscultation       Cardiovascular hypertension, Pt. on medications Normal cardiovascular exam Rhythm:Regular Rate:Normal     Neuro/Psych  PSYCHIATRIC DISORDERS   Bipolar Disorder Schizophrenia  negative neurological ROS     GI/Hepatic negative GI ROS, Neg liver ROS,,,  Endo/Other  diabetes, Well Controlled, Type 2    Renal/GU negative Renal ROS  negative genitourinary   Musculoskeletal  (+) Arthritis ,    Abdominal   Peds  Hematology negative hematology ROS (+)   Anesthesia Other Findings   Reproductive/Obstetrics                             Anesthesia Physical Anesthesia Plan  ASA: 2  Anesthesia Plan: General   Post-op Pain Management: Tylenol  PO (pre-op)*   Induction: Intravenous  PONV Risk Score and Plan: 1 and Dexamethasone , Ondansetron  and Treatment may vary due to age or medical condition  Airway Management Planned: Oral ETT  Additional Equipment:   Intra-op Plan:   Post-operative Plan: Extubation in OR  Informed Consent: I have reviewed the patients History and Physical, chart, labs and discussed the procedure including the risks, benefits and alternatives for the proposed anesthesia with the patient or authorized representative who has indicated his/her understanding and acceptance.     Dental advisory given  Plan Discussed with: CRNA  Anesthesia Plan Comments: (2 IVs)       Anesthesia Quick Evaluation

## 2023-12-26 NOTE — Op Note (Addendum)
 12/26/2023  5:06 PM  PATIENT:  Bradley [name]  73 y.o. male  Patient Care Team: Medicine, [facility] as PCP - General (Family Medicine) Almeda Jacobs, MD as Consulting Physician (Hematology and Oncology) Candyce Champagne, MD as Consulting Physician (Colon and Rectal Surgery) Armbruster, Lendon Queen, MD as Consulting Physician (Gastroenterology)  PRE-OPERATIVE DIAGNOSIS:   COLOSTOMY FOR COLON RESECTION, DESIRE FOR OSTOMY TAKEDOWN POLYP AT APPENDICEAL ORIFICE OF CECUM  POST-OPERATIVE DIAGNOSIS:   COLOSTOMY FOR COLON RESECTION, DESIRE FOR OSTOMY TAKEDOWN POLYP AT APPENDICEAL ORIFICE OF CECUM  PROCEDURE:   -ROBOTIC RECTOSIGMOID RESECTION (LAR) -TAKEDOWN OF END DESCENDING COLOSTOMY WITH ANASTOMOSIS -ROBOTIC APPENDECTOMY -ROBOTIC LYSIS OF ADHESIONS , 2.5 HOURS (70% CASE) -SMALL BOWEL REPAIR x 1 -TRANSVERSUS ABDOMINIS PLANE (TAP) BLOCK - BILATERAL -FLEXIBLE SIGMOIDOSCOPY  SURGEON:  Eddye Goodie, MD  ASSISTANT:  Joyce Nixon, MD  An experienced assistant was required given the standard of surgical care given the complexity of the case.  This assistant was needed for exposure, dissection, suction, tissue approximation, retraction, perception, etc  ANESTHESIA:  General endotracheal intubation anesthesia (GETA) and Regional TRANSVERSUS ABDOMINIS PLANE (TAP) nerve block -BILATERAL for perioperative & postoperative pain control at the level of the transverse abdominis & preperitoneal spaces along the flank at the anterior axillary line, from subcostal ridge to iliac crest under laparoscopic guidance provided with liposomal bupivacaine  (Experel) 20mL mixed with 30 mL of bupivicaine 0.25% with epinephrine   Estimated Blood Loss (EBL):   Total I/O In: 2600 [I.V.:2000; IV Piggyback:600] Out: 355 [Urine:255; Blood:100].   (See anesthesia record)  Delay start of Pharmacological VTE agent (>24hrs) due to concerns of significant anemia, surgical blood loss, or risk of bleeding?:   no  DRAINS: (None) and 19 Fr Blake drain the tip resting in the pelvis  SPECIMEN:  Appendix & cecal wall, Colostomy, Rectosigmoid (open end proximal), and Distal anastomotic ring (FINAL DISTAL MARGIN)  DISPOSITION OF SPECIMEN:  Pathology  COUNTS:  Sponge, needle, & instrument counts CORRECT  PLAN OF CARE: Admit to inpatient   PATIENT DISPOSITION:  PACU - hemodynamically stable.  INDICATION: Pleasant patient status post colectomy with end ostomy.  The patient has recovered from that surgery and has understandably requested ostomy takedown.  Medically stabilized and felt reasonable to proceed.  Patient had colonoscopy yesterday not have polyp at appendiceal orifice of cecum.  Not amenable to endoscopic complete resection.  Appendectomy with cecal resection requested by gastroenterology.  I discussed the procedure with the patient:  The anatomy & physiology of the digestive tract was discussed.  The pathophysiology was discussed.  Possibility of remaining with an ostomy permanently was discussed.  I offered ostomy takedown.  Minimally invasive & open techniques were discussed.   Risks such as bleeding, infection, abscess, leak, reoperation, possible re-ostomy, injury to other organs, hernia, heart attack, death, and other risks were discussed.   I noted a good likelihood this will help address the problem.  Goals of post-operative recovery were discussed as well.  We will work to minimize complications.  Questions were answered.  The patient expresses understanding & wishes to proceed with surgery.  OR FINDINGS:   Extremely dense thick adhesions to abdominal wall interloop transverse ileal repair of enterotomy during lyse lesions done primarily with absorbable suture.  Stump with some persistent sigmoid so rectosigmoid resection done to proximal rectum.  Appendix not inflamed.  Appendix with cecal wall resected.  Opened in OR back table and grossly negative margins around small polyp at  appendiceal orifice.  No obvious metastatic  disease on visceral parietal peritoneum or liver.  It is a 31mm EEA anastomosis ( distal descending colon  connected to proximal rectum.)  It rests 17 cm from the anal verge by rigid proctoscopy.  CASE DATA: Type of patient?: Elective [facility] Private Case Status of Case? Elective Scheduled Infection Present At Time Of Surgery (PATOS)?  NO   DESCRIPTION:   Informed consent was confirmed.  The patient underwent general anaesthesia without difficulty.  The patient was positioned appropriately.  VTE prevention in place.  The patient's abdomen was clipped, prepped, & draped in a sterile fashion.  Surgical timeout confirmed our plan.  Peritoneal entry with a laparoscopic port was obtained using Varess spring needle entry technique in the left upper abdomen as the patient was positioned in reverse Trendelenburg.  I induced carbon dioxide insufflation.  No change in end tidal CO2 measurements.  Full symmetrical abdominal distention.  Initial port was carefully placed.  Camera inspection revealed no injury.  Extra ports were carefully placed under direct laparoscopic visualization.  Xi robot carefully docked & instruments placed.  We then continued with minimally invasive exploration.  I worked to freed adhesions to the anterior abdominal wall parietal peritoneum.  This took quite some time.  He had very thick small bowel adhesions to the midline with some chronic peritoneal thickening.  Had to convert to sharp scissors.  At the epicenter of a very dense infraumbilical midline adhesion patch noted a breach of small bowel mucosa.  This is identified.  Primarily closed using 3 oh V-Loc running serrated observable suture in a caudal fashion to good result.  I resumed lysis of adhesions.  Freed small bowel and omental adhesions off the colostomy and visceral peritoneum.  Make sure to free all small bowel interloop adhesions and adhesions to the left retroperitoneum and  colostomy mesentery and pelvis.  I was able to mobilize the terminal ileum appendix cecum and ascending colon and a lateral to medial and inferior to superior fashion to better identify the appendix.  I was able to transect through the mesoappendix and skeletonized mesentery off the cecal wall 5 cm circumferentially to good result.  Transected the appendix taking a healthy cuff of cecum using a 60 mm blue load robotic stapler to good result.    We focused on dissection down in the pelvis.  Work to free adhesions and identify the rectal stump.  Freed off visceral peritoneum along the right and left anterior rectal pelvic reflection.  Work to come behind the rectal stump and the presacral plane to help elevate the mesorectum off the sacral promontory to be sure we adequately identified the rectosigmoid junction.  I used EEA sizers and noted there was some old stool but no narrowing.  However it went over 20 cm and clearly could see Hoss durations and epiploic appendages consistent with some persistent distal sigmoid.  Because there was some redundant rectosigmoid tissues, I decided to transect more distally to the proximal rectum.  Used vessel sealer to transect through the mesentery at the rectosigmoid junction and the proximal rectum he is a rectum and skeletonized on shows an area in the proximal rectum.  Transected across this using a stapler to have a more healthy rectal cuff.  That rectosigmoid tissue later sent off to pathology.  Hemostasis was good.    We did reinspection and saw no injury or other concerns.  I then ran the bowel from the ileocecal valve proximally to the ligament of Treitz.  Ran the small bowel and  untwisted all areas.  Freed up a few remaining omental adhesions as well.  A few interloop and retroperitoneal adhesions.  Noted closure was intact on the small bowel repair.  We inspected the small intestine to ensure there is no injury or other surprises.  Hemostasis was good.  Felt it was  safe to proceed with colostomy takedown.  We made an incision around the ostomy.  I got into the subcutaneous tissues.  We used careful focused dissection and sharp dissection.  Some focused cautery dissection as well.  That helped to free adhesions to the subcutaneous tisses & fascia.  I was able to enter into the peritoneum focally.  I did a gentle finger sweep.  Gradually came around circumferentially and freed the bowel from remaining adhesions to the abdominal wall.  Wound tractor placed.  Appendix removed.  Rectosigmoid colon stump removed.   We were able eviscerate the ostomy to do inspection and assured viability and hemostasis.  Freed greater omental adhesions to the colon to allow better mobility as well.  To preserve length, I only went a few centimeters proximally to the colostomy surface.  Through the mesentery and transected carefully.  I clamped the colon at the point of resection using a reusable pursestringer device.  Passed a 2-0 Keith needle. I transected at the descending/sigmoid junction with a scalpel. I got healthy bleeding mucosa.  We sent the rectosigmoid colon specimen off to go to pathology.  We sized the colon orifice.   I chose a 31mm EEA anvil stapler system.  I reinforced the prolene pursestring with interrupted silk "belt loop" sutures.  I placed the anvil to the open end of the proximal remaining colon and closed around it using the pursestring.   Returned the viscera back into the abdomen and did inspection of the abdomen.  We did copious irrigation with crystalloid solution.  Hemostasis was good.  The distal end of the colon at the handle easily reached down to the rectal stump, therefore, splenic flexure mobilization was not needed.      I scrubbed down.  I went to the back table and opened up the cecal wall staple line and inspected the appendiceal orifice to note a small polyp there.  Grossly negative margins.  I do not feel we need to do any extra resection.    I did  gentle anal dilation and advanced the EEA stapler up the rectal stump. The spike was brought out at the provimal end of the rectal stump under direct visualization.  Dr Andy Bannister attached the anvil of the proximal colon the spike of the stapler. Anvil was tightened down and held clamped for 60 seconds.  Orientation was confirmed such that there is no twisting of the colon nor small bowel underneath the mesenteric defect. No concerning tension.  The EEA stapler was fired and held clamped for 30 seconds. The stapler was released & removed. Blue stitch is in the proximal ring.  Care was taken to ensure no other structures were incorporated within this either.  We noted 2 excellent anastomotic rings.   The colon proximal to the anastomosis was then gently occluded. The pelvis was filled with sterile irrigation.  I did flexible sigmoidoscopy.  Noted the anastomosis was at 17 cm from the anal verge consistent with the proximal rectum.  Intact anastomosis without active bleeding.  No mucosal ischemia.  There was a negative air leak test. There was no tension of mesentery or bowel at the anastomosis.   Tissues looked viable.  Ureters & bowel uninjured.  The anastomosis looked healthy. Greater omentum positioned down into the pelvis to help protect the anastomosis, it was very foreshortened given the prior surgeries and would not adequately reach.  Because it was rather bloody and it is even a small bowel repair, decided to place a 19 French pelvic Blake drain in the pelvis along the left paracolic gutter.  [place] then and secured at the skin using suture  I removed CO2 gas out through the ports.  Ports and will protector and instruments removed.  We changed gown and gloves.  Sterile unused instruments were used from this point out per colon SSI prevention protocol.  I closed the 5mm port sites using Monocryl stitch and sterile dressing.  I closed the fascia of the abdominal wall ostomy wound using #1 PDS.  Transected  excess skin to have a nice transverse bio concave wound.  Close the dermis of the old colostomy wound using 2-0 Vicryl interrupted sutures to good result.  I placed antibiotic-soaked wicks into the closure at the corners x2. I placed a sterile dressing.    Patient is being extubated go to recovery room. I discussed postop care with the patient in detail the office & in the holding area. Instructions are written.  I discussed operative findings, updated the patient's status, discussed probable steps to recovery, and gave postoperative recommendations to the patient's daughter per his request, [name] Cannella.  Recommendations were made.  Questions were answered.  She expressed understanding & appreciation.    Eddye Goodie, M.D., F.A.C.S. Gastrointestinal and Minimally Invasive Surgery [facility] [address], [address] [place], [place] [address] [phone] Main / Paging

## 2023-12-27 ENCOUNTER — Encounter (HOSPITAL_COMMUNITY): Payer: Self-pay | Admitting: Surgery

## 2023-12-27 LAB — BASIC METABOLIC PANEL WITH GFR
Anion gap: 10 (ref 5–15)
BUN: 22 mg/dL (ref 8–23)
CO2: 24 mmol/L (ref 22–32)
Calcium: 8.2 mg/dL — ABNORMAL LOW (ref 8.9–10.3)
Chloride: 103 mmol/L (ref 98–111)
Creatinine, Ser: 1.34 mg/dL — ABNORMAL HIGH (ref 0.61–1.24)
GFR, Estimated: 57 mL/min — ABNORMAL LOW (ref >60)
Glucose, Bld: 137 mg/dL — ABNORMAL HIGH (ref 70–99)
Potassium: 5.2 mmol/L — ABNORMAL HIGH (ref 3.5–5.1)
Sodium: 137 mmol/L (ref 135–145)

## 2023-12-27 LAB — GLUCOSE, CAPILLARY
Glucose-Capillary: 162 mg/dL — ABNORMAL HIGH (ref 70–99)
Glucose-Capillary: 134 mg/dL — ABNORMAL HIGH (ref 70–99)
Glucose-Capillary: 137 mg/dL — ABNORMAL HIGH (ref 70–99)

## 2023-12-27 LAB — CBC
HCT: 40.2 % (ref 39.0–52.0)
Hemoglobin: 12.4 g/dL — ABNORMAL LOW (ref 13.0–17.0)
MCH: 28.1 pg (ref 26.0–34.0)
MCHC: 30.8 g/dL (ref 30.0–36.0)
MCV: 91.2 fL (ref 80.0–100.0)
Platelets: 108 10*3/uL — ABNORMAL LOW (ref 150–400)
RBC: 4.41 MIL/uL (ref 4.22–5.81)
RDW: 15.4 % (ref 11.5–15.5)
WBC: 7.7 10*3/uL (ref 4.0–10.5)
nRBC: 0 % (ref 0.0–0.2)

## 2023-12-27 LAB — MAGNESIUM: Magnesium: 1.7 mg/dL (ref 1.7–2.4)

## 2023-12-27 MED ORDER — INSULIN ASPART 100 UNIT/ML IJ SOLN: 0.0000 [IU] | Freq: Every day | INTRAMUSCULAR | Status: DC

## 2023-12-27 MED ORDER — TRAMADOL HCL 50 MG PO TABS: 50.0000 mg | ORAL_TABLET | Freq: Four times a day (QID) | ORAL | 0 refills | Status: AC | PRN

## 2023-12-27 MED ORDER — CYCLOBENZAPRINE HCL 5 MG PO TABS
5.0000 mg | ORAL_TABLET | Freq: Three times a day (TID) | ORAL | Status: DC | PRN
Start: 2023-12-27 — End: 2023-12-31

## 2023-12-27 MED ORDER — INSULIN ASPART 100 UNIT/ML IJ SOLN
0.0000 [IU] | Freq: Three times a day (TID) | INTRAMUSCULAR | Status: DC
  Administered 2023-12-27: 3 [IU] via SUBCUTANEOUS
  Administered 2023-12-27 – 2023-12-28 (×2): 2 [IU] via SUBCUTANEOUS
  Administered 2023-12-28 (×2): 3 [IU] via SUBCUTANEOUS
  Administered 2023-12-29: 2 [IU] via SUBCUTANEOUS
  Administered 2023-12-30: 3 [IU] via SUBCUTANEOUS

## 2023-12-27 NOTE — Progress Notes (Signed)
 Patient sat in chair for 3 hours overnight, able to move 1-2 assist w/ walker

## 2023-12-27 NOTE — Progress Notes (Signed)
 12/27/2023  Bradley [name] [id number] [birth year]  CARE TEAM: PCP: Medicine, [facility]  Outpatient Care Team: Patient Care Team: Medicine, [facility] as PCP - General (Family Medicine) Almeda Jacobs, MD as Consulting Physician (Hematology and Oncology) Candyce Champagne, MD as Consulting Physician (Colon and Rectal Surgery) Armbruster, Lendon Queen, MD as Consulting Physician (Gastroenterology)  Inpatient Treatment Team: Treatment Team:  Candyce Champagne, MD Philip Bravo, RN McCoy, Shirley, NT Popella, Octavia Belton, OT Pham, [place], RPH Payson, Luverne Salvia, PT   Problem List:   Principal Problem:   Colon stricture [facility]) Active Problems:   Diabetes mellitus, type II (HCC)   Schizoaffective disorder, bipolar type (HCC)   Vitamin D deficiency   Severe obesity (BMI 35.0-35.9 with comorbidity) (HCC)   12/26/2023  POST-OPERATIVE DIAGNOSIS:   COLOSTOMY FOR COLON RESECTION, DESIRE FOR OSTOMY TAKEDOWN POLYP AT APPENDICEAL ORIFICE OF CECUM   PROCEDURE:   -ROBOTIC RECTOSIGMOID RESECTION (LAR) -TAKEDOWN OF END DESCENDING COLOSTOMY WITH ANASTOMOSIS -ROBOTIC APPENDECTOMY -ROBOTIC LYSIS OF ADHESIONS , 2.5 HOURS (70% CASE) -SMALL BOWEL REPAIR x 1 -TRANSVERSUS ABDOMINIS PLANE (TAP) BLOCK - BILATERAL -FLEXIBLE SIGMOIDOSCOPY   SURGEON:  Eddye Goodie, MD   OR FINDINGS:   Extremely dense thick adhesions to abdominal wall interloop transverse ileal repair of enterotomy during lyse lesions done primarily with absorbable suture.  Stump with some persistent sigmoid so rectosigmoid resection done to proximal rectum.  Appendix not inflamed.  Appendix with cecal wall resected.  Opened in OR back table and grossly negative margins around small polyp at appendiceal orifice.  No obvious metastatic disease on visceral parietal peritoneum or liver.   It is a 31mm EEA anastomosis ( distal descending colon  connected to proximal rectum.)  It rests 17 cm from the anal verge by  rigid proctoscopy.    Assessment [facility] Stay = 1 days) 1 Day Post-Op    Recovering relatively well    Plan:  ERAS protocol  Tolerating clear liquids.  Try dysphagia 1/full liquid diet.  Hopefully advance to solid diet later today.    Fiber bowel regimen  Wean off oxygen.  Medlock IV fluids and keep on the dry side.  Mildly elevated creatinine not too surprising.  He is nonoliguric.  Monitor.  Should self-correct.  Hypertension controlled  History of schizoaffective/bipolar disorder.  Resuming home medications.  No evidence of agitation.  -monitor electrolytes & replace as needed  Keep K>4, Mg>2, Phos>3  -VTE prophylaxis- SCDs.  Anticoagulation prophyllaxis SQ as appropriate  -mobilize as tolerated to help recovery.  Enlist therapies in moderate/high risk patients as appropriate  I updated the patient's status to the patient and nurse  Recommendations were made.  Questions were answered.  They expressed understanding & appreciation.  -Disposition:  Disposition:  The patient is from: Home Anticipate discharge to:  Home Anticipated Date of Discharge is:  May 10,2025   Barriers to discharge:  Pending Clinical improvement (more likely than not) and Therapy assessment & Recommendations pending  Patient currently is NOT MEDICALLY STABLE for discharge from the hospital from a surgery standpoint.      I reviewed nursing notes, last 24 h vitals and pain scores, last 48 h intake and output, last 24 h labs and trends, and last 24 h imaging results.  I have reviewed this patient's available data, including medical history, events of note, test results, etc as part of my evaluation.   A significant portion of that time was spent in counseling. Care during the described time  interval was provided by me.  This care required moderate level of medical decision making.  12/27/2023    Subjective: (Chief complaint)  Patient without major events.  Had some nausea at first  but now tolerating liquids.  Hungry.  Nursing just outside room.  Objective:  Vital signs:  Vitals:   12/26/23 2223 12/27/23 0210 12/27/23 0500 12/27/23 0523  BP: (!) 163/79 136/68  120/64  Pulse: 76 (!) 59  66  Resp: 16 16  16   Temp: 98.1 F (36.7 C) 98 F (36.7 C)  (!) 97.4 F (36.3 C)  TempSrc: Oral   Oral  SpO2: 94% 94%  95%  Weight:   112 kg   Height:        Last BM Date : 12/25/23  Intake/Output   Yesterday:  05/08 0701 - 05/09 0700 In: 4080.8 [P.O.:590; I.V.:2890; IV Piggyback:600.8] Out: 2110 [Urine:1475; Drains:535; Blood:100] This shift:  No intake/output data recorded.  Bowel function:  Flatus: YES  BM:  YES  Drain: Serosanguinous   Physical Exam:  General: Pt awake/alert in no acute distress Eyes: PERRL, normal EOM.  Sclera clear.  No icterus Neuro: CN II-XII intact w/o focal sensory/motor deficits. Lymph: No head/neck/groin lymphadenopathy Psych:  No delerium/psychosis/paranoia.  Oriented x 4 HENT: Normocephalic, Mucus membranes moist.  No thrush Neck: Supple, No tracheal deviation.  No obvious thyromegaly Chest: No pain to chest wall compression.  Good respiratory excursion.  No audible wheezing CV:  Pulses intact.  Regular rhythm.  No major extremity edema MS: Normal AROM mjr joints.  No obvious deformity  Abdomen: Soft.  Mildy distended.  Mildly tender at incisions only.  Old blood at the colostomy site.  No evidence of peritonitis.  No incarcerated hernias.  Ext:   No deformity.  No mjr edema.  No cyanosis Skin: No petechiae / purpurea.  No major sores.  Warm and dry    Results:   Cultures: No results found for this or any previous visit (from the past 720 hours).  Labs: Results for orders placed or performed during the hospital encounter of 12/26/23 (from the past 48 hours)  Glucose, capillary     Status: Abnormal   Collection Time: 12/26/23  9:40 AM  Result Value Ref Range   Glucose-Capillary 127 (H) 70 - 99 mg/dL    Comment:  Glucose reference range applies only to samples taken after fasting for at least 8 hours.  Glucose, capillary     Status: Abnormal   Collection Time: 12/26/23  5:34 PM  Result Value Ref Range   Glucose-Capillary 146 (H) 70 - 99 mg/dL    Comment: Glucose reference range applies only to samples taken after fasting for at least 8 hours.  Basic metabolic panel     Status: Abnormal   Collection Time: 12/27/23  4:53 AM  Result Value Ref Range   Sodium 137 135 - 145 mmol/L   Potassium 5.2 (H) 3.5 - 5.1 mmol/L   Chloride 103 98 - 111 mmol/L   CO2 24 22 - 32 mmol/L   Glucose, Bld 137 (H) 70 - 99 mg/dL    Comment: Glucose reference range applies only to samples taken after fasting for at least 8 hours.   BUN 22 8 - 23 mg/dL   Creatinine, Ser 1.61 (H) 0.61 - 1.24 mg/dL   Calcium  8.2 (L) 8.9 - 10.3 mg/dL   GFR, Estimated 57 (L) >60 mL/min    Comment: (NOTE) Calculated using the CKD-EPI Creatinine Equation (2021)    Anion  gap 10 5 - 15    Comment: Performed at [facility], [address], [place], [place] [address]  CBC     Status: Abnormal   Collection Time: 12/27/23  4:53 AM  Result Value Ref Range   WBC 7.7 4.0 - 10.5 K/uL   RBC 4.41 4.22 - 5.81 MIL/uL   Hemoglobin 12.4 (L) 13.0 - 17.0 g/dL   HCT 47.8 29.5 - 62.1 %   MCV 91.2 80.0 - 100.0 fL   MCH 28.1 26.0 - 34.0 pg   MCHC 30.8 30.0 - 36.0 g/dL   RDW 30.8 65.7 - 84.6 %   Platelets 108 (L) 150 - 400 K/uL    Comment: REPEATED TO VERIFY   nRBC 0.0 0.0 - 0.2 %    Comment: Performed at [facility], [address], [place], [place] [address]  Magnesium     Status: None   Collection Time: 12/27/23  4:53 AM  Result Value Ref Range   Magnesium 1.7 1.7 - 2.4 mg/dL    Comment: Performed at [facility], [address], [place], [place] [address]    Imaging / Studies: No results found.  Medications / Allergies: per chart  Antibiotics: Anti-infectives (From admission, onward)     Start     Dose/Rate Route Frequency Ordered Stop   12/26/23 2200  cefoTEtan  (CEFOTAN ) 2 g in sodium chloride  0.9 % 100 mL IVPB        2 g 200 mL/hr over 30 Minutes Intravenous Every 12 hours 12/26/23 1833 12/27/23 0001   12/26/23 1400  neomycin  (MYCIFRADIN ) tablet 1,000 mg  Status:  Discontinued       Placed in "And" Linked Group   1,000 mg Oral 3 times per day 12/26/23 0942 12/26/23 0950   12/26/23 1400  metroNIDAZOLE  (FLAGYL ) tablet 1,000 mg  Status:  Discontinued       Placed in "And" Linked Group   1,000 mg Oral 3 times per day 12/26/23 0942 12/26/23 0950   12/26/23 0945  cefoTEtan  (CEFOTAN ) 2 g in sodium chloride  0.9 % 100 mL IVPB        2 g 200 mL/hr over 30 Minutes Intravenous On call to O.R. 12/26/23 0942 12/26/23 1258         Note: Portions of this report may have been transcribed using voice recognition software. Every effort was made to ensure accuracy; however, inadvertent computerized transcription errors may be present.   Any transcriptional errors that result from this process are unintentional.    Eddye Goodie, MD, FACS, MASCRS Esophageal, Gastrointestinal & Colorectal Surgery Robotic and Minimally Invasive Surgery  [facility] [address] [place], [place] [address] [phone] Fax [phone] Main  CONTACT INFORMATION: Weekday (9AM-5PM): Call [facility] main office at [phone] Weeknight (5PM-9AM) or Weekend/Holiday: Check EPIC "Web Links" tab & use "AMION" (password " [email or web address]") for General Surgery [facility] coverage  Please, DO NOT use SecureChat  (it is not reliable communication to reach operating surgeons & will lead to a delay in care).   Epic staff messaging available for outptient concerns needing 1-2 business day response.      12/27/2023  7:15 AM

## 2023-12-27 NOTE — Evaluation (Signed)
 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  Pt with previous admission 06/11/23 from home with complete colon and bowel obstruction,  s/p colectomy with end colostomy. Pt decompensated post op and placed on vent with extubation 10/26 and discharged to SNF. PMH: DM and bipolar  Clinical Impression  Pt admitted with above diagnosis.  Pt currently with functional limitations due to the deficits listed below (see PT Problem List). Pt will benefit from acute skilled PT to increase their independence and safety with mobility to allow discharge.  Attempted to see pt before noon, however dressing around JP drain site saturated and leaking onto gown so RN notified.  Upon return to pt after lunch, he did not recall even having a drain or dressing change.  Pt reports he is from home alone and does not use assistive device at baseline.  Pt mobilizing well but requiring cues for safety.  Pt also reports not wearing supplemental oxygen at home however SpO2 dropped to 83% on room air during ambulation.  Pt would benefit from at least initial 24/7 supervision upon d/c.  Patient Saturations on Room Air at Rest = 93%  Patient Saturations on [employer] while Ambulating = 83%  Patient returned to room and reapplied 3L O2 [place] and saturations improved to 94% with sitting rest break           If plan is discharge home, recommend the following: Supervision due to cognitive status   Can travel by private vehicle        Equipment Recommendations Rolling walker (2 wheels)  Recommendations for Other Services       Functional Status Assessment Patient has had a recent decline in their functional status and demonstrates the ability to make significant improvements in function in a  reasonable and predictable amount of time.     Precautions / Restrictions Precautions Precautions: Fall Precaution/Restrictions Comments: R JP drain, monitor sats      Mobility  Bed Mobility Overal bed mobility: Needs Assistance Bed Mobility: Supine to Sit, Sit to Supine     Supine to sit: Supervision, HOB elevated, Used rails Sit to supine: Contact guard assist, HOB elevated   General bed mobility comments: cues for technique, required management of foley and JP drain (pt does not recall having them); SpO2 93% on room air at rest (pt reports not wearing O2 at home)    Transfers Overall transfer level: Needs assistance Equipment used: None Transfers: Sit to/from Stand Sit to Stand: Contact guard assist           General transfer comment: cues for self assist with UEs, observed wide BOS to stand    Ambulation/Gait Ambulation/Gait assistance: Contact guard assist Gait Distance (Feet): 360 Feet Assistive device: Rolling walker (2 wheels) Gait Pattern/deviations: Step-through pattern, Decreased stride length, Trunk flexed Gait velocity: fast pace     General Gait Details: pt reports not using RW however left out in room in case of need upon stand, once pt standing, he reached out for RW to use, cues for posture and RW positioning (tends to keep too far forward); SpO2 dropped to 83% on room air and pt with increased work of breathing upon return to room so 3L O2  reapplied and SPO2 quickly returned to 94% with cues of pursed lip breathing  Stairs  Wheelchair Mobility     Tilt Bed    Modified Rankin (Stroke Patients Only)       Balance Overall balance assessment: Mild deficits observed, not formally tested                                           Pertinent Vitals/Pain Pain Assessment Pain Assessment: 0-10 Pain Score: 5  Pain Location: abdomen Pain Descriptors / Indicators: Sore Pain Intervention(s): Repositioned,  Monitored during session    Home Living Family/patient expects to be discharged to:: Private residence Living Arrangements: Alone   Type of Home: Apartment Home Access: Level entry       Home Layout: One level Home Equipment: None      Prior Function Prior Level of Function : Independent/Modified Independent;Patient poor historian/Family not available                     Extremity/Trunk Assessment        Lower Extremity Assessment Lower Extremity Assessment: Generalized weakness    Cervical / Trunk Assessment Cervical / Trunk Assessment: Kyphotic  Communication   Communication Communication: Impaired Factors Affecting Communication: Hearing impaired    Cognition Arousal: Alert Behavior During Therapy: WFL for tasks assessed/performed   PT - Cognitive impairments: Initiation, Problem solving                       PT - Cognition Comments: pt does not recall JP drain or dressing (short term memory?)         Cueing       General Comments      Exercises     Assessment/Plan    PT Assessment Patient needs continued PT services  PT Problem List Decreased strength;Decreased activity tolerance;Decreased balance;Decreased mobility;Cardiopulmonary status limiting activity;Decreased knowledge of use of DME;Decreased cognition       PT Treatment Interventions Gait training;DME instruction;Stair training;Functional mobility training;Therapeutic activities;Therapeutic exercise;Patient/family education;Balance training    PT Goals (Current goals can be found in the Care Plan section)  Acute Rehab PT Goals PT Goal Formulation: With patient Time For Goal Achievement: 01/10/24 Potential to Achieve Goals: Good    Frequency Min 2X/week     Co-evaluation               AM-PAC PT "6 Clicks" Mobility  Outcome Measure Help needed turning from your back to your side while in a flat bed without using bedrails?: A Little Help needed moving from  lying on your back to sitting on the side of a flat bed without using bedrails?: A Little Help needed moving to and from a bed to a chair (including a wheelchair)?: A Little Help needed standing up from a chair using your arms (e.g., wheelchair or bedside chair)?: A Little Help needed to walk in hospital room?: A Little Help needed climbing 3-5 steps with a railing? : A Lot 6 Click Score: 17    End of Session Equipment Utilized During Treatment: Gait belt;Oxygen Activity Tolerance: Patient tolerated treatment well Patient left: in bed;with call bell/phone within reach;with bed alarm set   PT Visit Diagnosis: Difficulty in walking, not elsewhere classified (R26.2)    Time: 1314-1330 PT Time Calculation (min) (ACUTE ONLY): 16 min   Charges:   PT Evaluation $PT Eval Low Complexity: 1 Low   PT General Charges $$ ACUTE PT VISIT: 1 Visit  Blanch Bunde, DPT Physical Therapist Acute Rehabilitation Services Office: [phone]   Myna Asal Payson 12/27/2023, 2:30 PM

## 2023-12-27 NOTE — Progress Notes (Signed)
   12/27/23 1058  TOC Brief Assessment  Insurance and Status Reviewed  Patient has primary care physician Yes  Home environment has been reviewed resides in an apartment  Prior level of function: Independent  Prior/Current Home Services No current home services  Social Drivers of Health Review SDOH reviewed no interventions necessary  Readmission risk has been reviewed Yes  Transition of care needs no transition of care needs at this time

## 2023-12-27 NOTE — Progress Notes (Signed)
 Mobility Specialist - Progress Note   12/27/23 0748  Mobility  Activity Transferred from bed to chair  Level of Assistance Minimal assist, patient does 75% or more  Assistive Device Front wheel walker  Range of Motion/Exercises Active Assistive  Activity Response Tolerated well  Mobility Referral Yes  Mobility visit 1 Mobility  Mobility Specialist Start Time (ACUTE ONLY) [id number]  Mobility Specialist Stop Time (ACUTE ONLY) 0748  Mobility Specialist Time Calculation (min) (ACUTE ONLY) 10 min   Pt requested to transfer to recliner chair. Assisted with transfer was left with chair alarm on. RN in room.  Lorna Rose Mobility Specialist

## 2023-12-27 NOTE — Progress Notes (Signed)
 OT Cancellation Note  Patient Details Name: Bradley [name] MRN: [id number] DOB: Feb 02, 1951   Cancelled Treatment:    Reason Eval/Treat Not Completed: Other (comment). Recently seen by PT, requesting OT eval next date.    Kean Gautreau D Khrista Braun 12/27/2023, 3:15 PM 12/27/2023  RP, OTR/L  Acute Rehabilitation Services  Office:  [phone]

## 2023-12-27 NOTE — Progress Notes (Addendum)
 Notified by NT around 2200 that patient's BP was 81/47. Patient is AAOx4, 1-Assist to chair and no currently experiencing any pain when asked.   Patient's order for LR 1L Bolus seen, and one 1L Bolus of LR was started within the 2200 hour.

## 2023-12-27 NOTE — TOC Initial Note (Addendum)
 Transition of Care [facility]) - Initial/Assessment Note    Patient Details  Name: Bradley [name] MRN: [id number] Date of Birth: 27-Apr-1951  [facility]) CM/SW Contact:    Bari Leys, RN Phone Number: 12/27/2023, 3:12 PM  Clinical Narrative:   PT eval completed, recommendation for [facility] PT, RW. Met with patient at bedside to introduce role of TOC/NCM and review for dc planning, pt confirmed he lives alone and is independent with his self care, has support from his daughter and nephew who live locally , reports no current home care services or home DME, reports he rides the bus for transportation. Patient has a PCP on file. Patient agreeable to [facility] PT, no preference. EPIC email sent to attending to enter order for [facility] PT and RW. Referral sent to [facility] rep-Mitch for RW.   [facility], rep-Amy, accepted for [facility] PT.               Expected Discharge Plan: Home w Home Health Services Barriers to Discharge: Continued Medical Work up   Patient Goals and CMS Choice Patient states their goals for this hospitalization and ongoing recovery are:: return home CMS Medicare.gov Compare Post Acute Care list provided to:: Patient Choice offered to / list presented to : Patient [place] ownership interest in [facility].provided to:: Patient    Expected Discharge Plan and Services       Living arrangements for the past 2 months: Apartment                   DME Agency: [employer]                  Prior Living Arrangements/Services Living arrangements for the past 2 months: Apartment Lives with:: Self                   Activities of Daily Living   ADL Screening (condition at time of admission) Independently performs ADLs?: Yes (appropriate for developmental age) Is the patient deaf or have difficulty hearing?: No Does the patient have difficulty seeing, even when wearing glasses/contacts?: No Does the patient have difficulty concentrating, remembering,  or making decisions?: No  Permission Sought/Granted                  Emotional Assessment              Admission diagnosis:  Colon stricture [facility]) [K56.699] Patient Active Problem List   Diagnosis Date Noted   Colon stricture (HCC) 12/26/2023   History of adenomatous polyp of colon 07/10/2023   Severe obesity (BMI 35.0-35.9 with comorbidity) (HCC) 06/14/2023   Abnormal CT scan 06/12/2023   Impacted cerumen of left ear 02/01/2023   Schizoaffective disorder, bipolar type (HCC) 01/08/2022   Malignant neoplasm of prostate (HCC) 04/01/2020   Vitamin D deficiency 02/09/2018   Bipolar I disorder (HCC)    Hypertension    Erectile dysfunction 01/22/2014   Tobacco abuse 01/22/2014   Thrombocytopenia, unspecified (HCC) 06/17/2013   Testosterone deficiency 02/08/2012   Hyperlipemia    Diabetes mellitus, type II (HCC) 08/23/2011   PCP:  Medicine, [facility]:   [facility] [phone] Jonette Nestle, [place] - [address] AT [facility] [address] [facility] [address] [place] [address] Phone: [phone] Fax: [phone]  [facility] [place], [place] - [address] [place] [address] [place] [address] Phone: [phone] Fax: [phone]  Social Drivers of Health (SDOH) Social History: SDOH Screenings   Food Insecurity: Patient Declined (12/26/2023)  Housing: Patient Declined (12/26/2023)  Transportation Needs: Patient Declined (12/26/2023)  Utilities: Patient Declined (12/26/2023)  Financial Resource Strain: Not on File (12/07/2021)   Received from [place], [place]  Physical Activity: Not on File (12/07/2021)   Received from [place], [place]  Social Connections: Patient Declined (12/26/2023)  Stress: Not on File (12/07/2021)   Received from [place], [place]  Tobacco Use: High Risk (12/26/2023)   SDOH Interventions:     Readmission Risk Interventions    12/27/2023   10:58 AM 06/25/2023   12:00 PM  Readmission Risk  Prevention Plan  Transportation Screening Complete Complete  PCP or Specialist Appt within 5-7 Days Complete   PCP or Specialist Appt within 3-5 Days  Complete  Home Care Screening Complete   Medication Review (RN CM) Complete   HRI or Home Care Consult  Complete  Social Work Consult for Recovery Care Planning/Counseling  Complete  Palliative Care Screening  Not Applicable  Medication Review [employer])  Complete

## 2023-12-27 NOTE — Progress Notes (Signed)
 Rapid Response RN doing rounds, made her aware of patient's soft BP and (otherwise, unchanged) status. She agreed to check back on patient shortly and continue to monitor.

## 2023-12-28 LAB — GLUCOSE, CAPILLARY
Glucose-Capillary: 165 mg/dL — ABNORMAL HIGH (ref 70–99)
Glucose-Capillary: 153 mg/dL — ABNORMAL HIGH (ref 70–99)
Glucose-Capillary: 130 mg/dL — ABNORMAL HIGH (ref 70–99)
Glucose-Capillary: 122 mg/dL — ABNORMAL HIGH (ref 70–99)

## 2023-12-28 LAB — POTASSIUM: Potassium: 4.3 mmol/L (ref 3.5–5.1)

## 2023-12-28 LAB — CREATININE, SERUM
Creatinine, Ser: 2.44 mg/dL — ABNORMAL HIGH (ref 0.61–1.24)
GFR, Estimated: 28 mL/min — ABNORMAL LOW (ref >60)

## 2023-12-28 LAB — HEMOGLOBIN: Hemoglobin: 11.2 g/dL — ABNORMAL LOW (ref 13.0–17.0)

## 2023-12-28 MED ORDER — SODIUM CHLORIDE 0.9 % IV SOLN: INTRAVENOUS | Status: AC

## 2023-12-28 NOTE — Progress Notes (Signed)
 2 Days Post-Op   Subjective/Chief Complaint: No complaints other than soreness   Objective: Vital signs in last 24 hours: Temp:  [97.9 F (36.6 C)-98.4 F (36.9 C)] 97.9 F (36.6 C) (05/10 0501) Pulse Rate:  [73-89] 89 (05/10 0501) Resp:  [16-19] 19 (05/10 0501) BP: (81-116)/(47-65) 90/60 (05/10 0501) SpO2:  [87 %-96 %] 92 % (05/10 1153) Weight:  [112.6 kg] 112.6 kg (05/10 0500) Last BM Date : 12/25/23  Intake/Output from previous day: 05/09 0701 - 05/10 0700 In: 2210 [P.O.:1210; I.V.:1000] Out: 640 [Urine:450; Drains:190] Intake/Output this shift: Total I/O In: 360 [P.O.:360] Out: 15 [Drains:15]  General appearance: alert and cooperative Resp: clear to auscultation bilaterally Cardio: regular rate and rhythm GI: soft, mild tenderness  Lab Results:  Recent Labs    12/27/23 0453 12/27/23 2334  WBC 7.7  --   HGB 12.4* 11.2*  HCT 40.2  --   PLT 108*  --    BMET Recent Labs    12/27/23 0453 12/28/23 0547  NA 137  --   K 5.2* 4.3  CL 103  --   CO2 24  --   GLUCOSE 137*  --   BUN 22  --   CREATININE 1.34* 2.44*  CALCIUM  8.2*  --    PT/INR No results for input(s): "LABPROT", "INR" in the last 72 hours. ABG No results for input(s): "PHART", "HCO3" in the last 72 hours.  Invalid input(s): "PCO2", "PO2"  Studies/Results: No results found.  Anti-infectives: Anti-infectives (From admission, onward)    Start     Dose/Rate Route Frequency Ordered Stop   12/26/23 2200  cefoTEtan  (CEFOTAN ) 2 g in sodium chloride  0.9 % 100 mL IVPB        2 g 200 mL/hr over 30 Minutes Intravenous Every 12 hours 12/26/23 1833 12/27/23 0001   12/26/23 1400  neomycin  (MYCIFRADIN ) tablet 1,000 mg  Status:  Discontinued       Placed in "And" Linked Group   1,000 mg Oral 3 times per day 12/26/23 0942 12/26/23 0950   12/26/23 1400  metroNIDAZOLE  (FLAGYL ) tablet 1,000 mg  Status:  Discontinued       Placed in "And" Linked Group   1,000 mg Oral 3 times per day 12/26/23 0942  12/26/23 0950   12/26/23 0945  cefoTEtan  (CEFOTAN ) 2 g in sodium chloride  0.9 % 100 mL IVPB        2 g 200 mL/hr over 30 Minutes Intravenous On call to O.R. 12/26/23 4098 12/26/23 1258       Assessment/Plan: s/p Procedure(s) with comments: ROBOTIC RECTOSIGMOID RESECTION (LAR) ; TAKEDOWN OF END DESCENDING COLOSTOMY WITH ANASTOMOSIS  ;ROBOTIC LYSIS OF ADHESIONS  TRANSVERSUS ABDOMINIS PLANE (TAP) BLOCK - BILATERAL (N/A) - LYSIS OF ADHESIONS REPAIR, SMALL INTESTINE SIGMOIDOSCOPY, FLEXIBLE APPENDECTOMY, ROBOT-ASSISTED, LAPAROSCOPIC Advance diet Cr elevated. Will put him back on IVF. Hold lisinopril . Recheck labs in am POD 2 D/c wicks ambulate  LOS: 2 days    Bradley [name] 12/28/2023

## 2023-12-28 NOTE — Progress Notes (Signed)
 Pt is alert and oriented x4. However forgetful and having to repeat things/information to him, for understanding. Pt is sitting up in recliner. He is warm, dry,no visible distress. No c/o of difficulty breathing. Lung fields are diminished. He continues to wear o2@3lpm  via [place]. Placed pulse oximetry on ear for a few minutes. Received reading of 96%. Phoned respiratory to administer breathing treatment. She is up to unit to administer at present time. Encouraged usage of incentive spirometer. He is able to get it up to . Encouraged using it every hour that he is awake and to take his time.

## 2023-12-28 NOTE — Evaluation (Signed)
 Occupational Therapy Evaluation Patient Details Name: Bradley [name] MRN: [id number] DOB: Jun 06, 1951 Today's Date: 12/28/2023   History of Present Illness   73 year old male s/p ROBOTIC RECTOSIGMOID RESECTION (LAR) and TAKEDOWN OF END DESCENDING COLOSTOMY WITH ANASTOMOSIS on 12/26/23.  Pt with previous admission 06/11/23 from home with complete colon and bowel obstruction,  s/p colectomy with end colostomy. Pt decompensated post op and placed on vent with extubation 10/26 and discharged to SNF. PMH: DM and bipolar     Clinical Impressions Pt admitted with the above. Pt currently with functional limitations due to the deficits listed below (see OT Problem List).  Pt will benefit from acute skilled OT to increase their safety and independence with ADL and functional mobility for ADL to facilitate discharge.    Pt will not be able to go home and live alone- will need SNF    If plan is discharge home, recommend the following:   A little help with walking and/or transfers;A little help with bathing/dressing/bathroom     Functional Status Assessment   Patient has had a recent decline in their functional status and demonstrates the ability to make significant improvements in function in a reasonable and predictable amount of time.     Equipment Recommendations   BSC/3in1     Recommendations for Other Services         Precautions/Restrictions   Precautions Precautions: Fall Precaution/Restrictions Comments: R JP drain, monitor sats     Mobility Bed Mobility Overal bed mobility: Needs Assistance Bed Mobility: Supine to Sit     Supine to sit: HOB elevated, Used rails, Min assist     General bed mobility comments: cues for technique, required management of foley and JP drain (pt does not recall having them); SpO2 92% on room air at rest (pt reports not wearing O2 at home)    Transfers Overall transfer level: Needs assistance Equipment used: Rolling walker (2  wheels) Transfers: Sit to/from Stand Sit to Stand: Contact guard assist           General transfer comment: cues for self assist with UEs, observed wide BOS to stand      Balance Overall balance assessment: Mild deficits observed, not formally tested                                         ADL either performed or assessed with clinical judgement   ADL Overall ADL's : Needs assistance/impaired Eating/Feeding: Set up;Sitting   Grooming: Minimal assistance;Sitting   Upper Body Bathing: Moderate assistance;Standing   Lower Body Bathing: Maximal assistance;Sit to/from stand   Upper Body Dressing : Moderate assistance;Sitting   Lower Body Dressing: Maximal assistance;Sit to/from stand;Cueing for safety;Cueing for sequencing   Toilet Transfer: Moderate assistance Toilet Transfer Details (indicate cue type and reason): bed to chair Toileting- Clothing Manipulation and Hygiene: Maximal assistance;Sit to/from stand         General ADL Comments: VC for safety and hand placement     Vision Patient Visual Report: No change from baseline              Pertinent Vitals/Pain Pain Assessment Pain Assessment: Faces Pain Score: 4  Pain Location: abdomen Pain Descriptors / Indicators: Sore Pain Intervention(s): Limited activity within patient's tolerance     Extremity/Trunk Assessment Upper Extremity Assessment Upper Extremity Assessment: Generalized weakness           Communication  Cognition Arousal: Alert Behavior During Therapy: WFL for tasks assessed/performed               OT - Cognition Comments: hard to understand                                    Home Living Family/patient expects to be discharged to:: Private residence Living Arrangements: Alone   Type of Home: Apartment Home Access: Level entry     Home Layout: One level               Home Equipment: None          Prior  Functioning/Environment Prior Level of Function : Independent/Modified Independent;Patient poor historian/Family not available                    OT Problem List: Decreased strength;Decreased activity tolerance;Decreased safety awareness;Impaired balance (sitting and/or standing)   OT Treatment/Interventions: Self-care/ADL training;Patient/family education;Therapeutic activities      OT Goals(Current goals can be found in the care plan section)   Acute Rehab OT Goals Patient Stated Goal: did not state OT Goal Formulation: With patient Time For Goal Achievement: 01/11/24 Potential to Achieve Goals: Good   OT Frequency:  Min 2X/week       AM-PAC OT "6 Clicks" Daily Activity     Outcome Measure Help from another person eating meals?: A Little Help from another person taking care of personal grooming?: A Little Help from another person toileting, which includes using toliet, bedpan, or urinal?: A Lot Help from another person bathing (including washing, rinsing, drying)?: A Lot Help from another person to put on and taking off regular upper body clothing?: A Little Help from another person to put on and taking off regular lower body clothing?: A Lot 6 Click Score: 15   End of Session Equipment Utilized During Treatment: Rolling walker (2 wheels) Nurse Communication: Mobility status  Activity Tolerance: Patient limited by fatigue Patient left: in chair;with call bell/phone within reach;with bed alarm set;with nursing/sitter in room  OT Visit Diagnosis: Unsteadiness on feet (R26.81);Other abnormalities of gait and mobility (R26.89);Muscle weakness (generalized) (M62.81)                Time: 1610-9604 OT Time Calculation (min): 36 min Charges:  OT General Charges $OT Visit: 1 Visit OT Evaluation $OT Eval Moderate Complexity: 1 Mod OT Treatments $Self Care/Home Management : 23-37 mins  Wilbur Handing, OT Acute Rehabilitation Services [phone] Office- [phone], Milton Alpers D 12/28/2023, 12:35 PM

## 2023-12-29 LAB — CBC
HCT: 32.5 % — ABNORMAL LOW (ref 39.0–52.0)
Hemoglobin: 9.7 g/dL — ABNORMAL LOW (ref 13.0–17.0)
MCH: 28 pg (ref 26.0–34.0)
MCHC: 29.8 g/dL — ABNORMAL LOW (ref 30.0–36.0)
MCV: 93.7 fL (ref 80.0–100.0)
Platelets: 97 10*3/uL — ABNORMAL LOW (ref 150–400)
RBC: 3.47 MIL/uL — ABNORMAL LOW (ref 4.22–5.81)
RDW: 15.8 % — ABNORMAL HIGH (ref 11.5–15.5)
WBC: 7.8 10*3/uL (ref 4.0–10.5)
nRBC: 0 % (ref 0.0–0.2)

## 2023-12-29 LAB — BASIC METABOLIC PANEL WITH GFR
Anion gap: 4 — ABNORMAL LOW (ref 5–15)
BUN: 40 mg/dL — ABNORMAL HIGH (ref 8–23)
CO2: 29 mmol/L (ref 22–32)
Calcium: 7.9 mg/dL — ABNORMAL LOW (ref 8.9–10.3)
Chloride: 104 mmol/L (ref 98–111)
Creatinine, Ser: 1.73 mg/dL — ABNORMAL HIGH (ref 0.61–1.24)
GFR, Estimated: 42 mL/min — ABNORMAL LOW (ref >60)
Glucose, Bld: 111 mg/dL — ABNORMAL HIGH (ref 70–99)
Potassium: 4.4 mmol/L (ref 3.5–5.1)
Sodium: 137 mmol/L (ref 135–145)

## 2023-12-29 LAB — GLUCOSE, CAPILLARY
Glucose-Capillary: 104 mg/dL — ABNORMAL HIGH (ref 70–99)
Glucose-Capillary: 136 mg/dL — ABNORMAL HIGH (ref 70–99)
Glucose-Capillary: 117 mg/dL — ABNORMAL HIGH (ref 70–99)
Glucose-Capillary: 135 mg/dL — ABNORMAL HIGH (ref 70–99)

## 2023-12-29 NOTE — Plan of Care (Signed)
  Problem: Bowel/Gastric: Goal: Gastrointestinal status for postoperative course will improve Outcome: Progressing   Problem: Nutritional: Goal: Will attain and maintain optimal nutritional status will improve Outcome: Progressing   Problem: Clinical Measurements: Goal: Postoperative complications will be avoided or minimized Outcome: Progressing

## 2023-12-29 NOTE — Progress Notes (Signed)
CBG 135  

## 2023-12-29 NOTE — Progress Notes (Signed)
 3 Days Post-Op   Subjective/Chief Complaint: No complaints. No flatus   Objective: Vital signs in last 24 hours: Temp:  [97.6 F (36.4 C)-98 F (36.7 C)] 97.6 F (36.4 C) (05/11 0508) Pulse Rate:  [77-87] 81 (05/11 0603) Resp:  [17-18] 17 (05/11 0603) BP: (108-117)/(46-58) 108/46 (05/11 0508) SpO2:  [87 %-92 %] 90 % (05/11 0603) Last BM Date : 12/25/23  Intake/Output from previous day: 05/10 0701 - 05/11 0700 In: 1718.4 [P.O.:1010; I.V.:708.4] Out: 695 [Urine:650; Drains:45] Intake/Output this shift: Total I/O In: -  Out: 10 [Drains:10]  General appearance: alert and cooperative Resp: clear to auscultation bilaterally Cardio: regular rate and rhythm GI: soft, nontender. Quiet. Incisions ok  Lab Results:  Recent Labs    12/27/23 0453 12/27/23 2334 12/29/23 0522  WBC 7.7  --  7.8  HGB 12.4* 11.2* 9.7*  HCT 40.2  --  32.5*  PLT 108*  --  97*   BMET Recent Labs    12/27/23 0453 12/28/23 0547 12/29/23 0522  NA 137  --  137  K 5.2* 4.3 4.4  CL 103  --  104  CO2 24  --  29  GLUCOSE 137*  --  111*  BUN 22  --  40*  CREATININE 1.34* 2.44* 1.73*  CALCIUM  8.2*  --  7.9*   PT/INR No results for input(s): "LABPROT", "INR" in the last 72 hours. ABG No results for input(s): "PHART", "HCO3" in the last 72 hours.  Invalid input(s): "PCO2", "PO2"  Studies/Results: No results found.  Anti-infectives: Anti-infectives (From admission, onward)    Start     Dose/Rate Route Frequency Ordered Stop   12/26/23 2200  cefoTEtan  (CEFOTAN ) 2 g in sodium chloride  0.9 % 100 mL IVPB        2 g 200 mL/hr over 30 Minutes Intravenous Every 12 hours 12/26/23 1833 12/27/23 0001   12/26/23 1400  neomycin  (MYCIFRADIN ) tablet 1,000 mg  Status:  Discontinued       Placed in "And" Linked Group   1,000 mg Oral 3 times per day 12/26/23 0942 12/26/23 0950   12/26/23 1400  metroNIDAZOLE  (FLAGYL ) tablet 1,000 mg  Status:  Discontinued       Placed in "And" Linked Group   1,000 mg Oral  3 times per day 12/26/23 0942 12/26/23 0950   12/26/23 0945  cefoTEtan  (CEFOTAN ) 2 g in sodium chloride  0.9 % 100 mL IVPB        2 g 200 mL/hr over 30 Minutes Intravenous On call to O.R. 12/26/23 0981 12/26/23 1258       Assessment/Plan: s/p Procedure(s) with comments: ROBOTIC RECTOSIGMOID RESECTION (LAR) ; TAKEDOWN OF END DESCENDING COLOSTOMY WITH ANASTOMOSIS  ;ROBOTIC LYSIS OF ADHESIONS  TRANSVERSUS ABDOMINIS PLANE (TAP) BLOCK - BILATERAL (N/A) - LYSIS OF ADHESIONS REPAIR, SMALL INTESTINE SIGMOIDOSCOPY, FLEXIBLE APPENDECTOMY, ROBOT-ASSISTED, LAPAROSCOPIC Persistent ileus Cr improving Ambulate POD 3 May have some sleep apnea. O2 sats have been borderline although he seems asymptomatic. monitor  LOS: 3 days    Bradley [name] 12/29/2023

## 2023-12-30 DIAGNOSIS — N1831 Chronic kidney disease, stage 3a: Secondary | ICD-10-CM | POA: Insufficient documentation

## 2023-12-30 LAB — GLUCOSE, CAPILLARY
Glucose-Capillary: 116 mg/dL — ABNORMAL HIGH (ref 70–99)
Glucose-Capillary: 172 mg/dL — ABNORMAL HIGH (ref 70–99)
Glucose-Capillary: 107 mg/dL — ABNORMAL HIGH (ref 70–99)
Glucose-Capillary: 129 mg/dL — ABNORMAL HIGH (ref 70–99)

## 2023-12-30 LAB — SURGICAL PATHOLOGY

## 2023-12-30 MED ORDER — HYDROCHLOROTHIAZIDE 25 MG PO TABS
50.0000 mg | ORAL_TABLET | Freq: Every day | ORAL | Status: DC
  Administered 2023-12-30: 50 mg via ORAL
  Filled 2023-12-30: qty 2

## 2023-12-30 MED ORDER — CALCIUM POLYCARBOPHIL 625 MG PO TABS
1250.0000 mg | ORAL_TABLET | Freq: Two times a day (BID) | ORAL | Status: DC
  Administered 2023-12-30 – 2023-12-31 (×3): 1250 mg via ORAL
  Filled 2023-12-30 (×3): qty 2

## 2023-12-30 NOTE — Progress Notes (Signed)
 12/30/2023  Bradley [name] [id number] 03/05/1951  CARE TEAM: PCP: Medicine, [facility]  Outpatient Care Team: Patient Care Team: Medicine, [facility] as PCP - General (Family Medicine) Almeda Jacobs, MD as Consulting Physician (Hematology and Oncology) Candyce Champagne, MD as Consulting Physician (Colon and Rectal Surgery) Armbruster, Lendon Queen, MD as Consulting Physician (Gastroenterology)  Inpatient Treatment Team: Treatment Team:  Candyce Champagne, MD Merrill Abide, OT Ezra Holmes, RN Larayne Platter, NT Rubie Corona, [facility]   Problem List:   Principal Problem:   Colon stricture [facility]) Active Problems:   Hypertension   Diabetes mellitus, type II (HCC)   Schizoaffective disorder, bipolar type (HCC)   Vitamin D deficiency   Severe obesity (BMI 35.0-35.9 with comorbidity) (HCC)   History of adenomatous polyp of colon   CKD stage 3a, GFR 45-59 ml/min (HCC)   12/26/2023  POST-OPERATIVE DIAGNOSIS:   COLOSTOMY FOR COLON RESECTION, DESIRE FOR OSTOMY TAKEDOWN POLYP AT APPENDICEAL ORIFICE OF CECUM   PROCEDURE:   -ROBOTIC RECTOSIGMOID RESECTION (LAR) -TAKEDOWN OF END DESCENDING COLOSTOMY WITH ANASTOMOSIS -ROBOTIC APPENDECTOMY -ROBOTIC LYSIS OF ADHESIONS , 2.5 HOURS (70% CASE) -SMALL BOWEL REPAIR x 1 -TRANSVERSUS ABDOMINIS PLANE (TAP) BLOCK - BILATERAL -FLEXIBLE SIGMOIDOSCOPY   SURGEON:  Eddye Goodie, MD   OR FINDINGS:   Extremely dense thick adhesions to abdominal wall interloop transverse ileal repair of enterotomy during lyse lesions done primarily with absorbable suture.  Stump with some persistent sigmoid so rectosigmoid resection done to proximal rectum.  Appendix not inflamed.  Appendix with cecal wall resected.  Opened in OR back table and grossly negative margins around small polyp at appendiceal orifice.  No obvious metastatic disease on visceral parietal peritoneum or liver.   It is a 31mm EEA anastomosis ( distal descending  colon  connected to proximal rectum.)  It rests 17 cm from the anal verge by rigid proctoscopy.    Assessment [facility] Stay = 4 days) 4 Days Post-Op    Recovering gradually    Plan:  ERAS protocol  No nausea or vomiting and tolerating full/dysphagia 1 diet.  Try solid diet.      Fiber bowel regimen  With his body habitus and speech pattern and intermittent hypoxia, highly suspicious for sleep apnea.  Noncompliance with nasal cannula or CPAP.  Will retry at least nightly to help out.  Elevated creatinine not surprising in setting of bowel prep and chronic kidney disease.  Naturally improving.  He is nonoliguric.  Monitor.  Should self-correct.  Remove surgical drain.  Hypertension controlled  History of schizoaffective/bipolar disorder.  Resuming home medications.  No evidence of agitation.  -monitor electrolytes & replace as needed.  Keep K>4, Mg>2, Phos>3  -VTE prophylaxis- SCDs.  Anticoagulation prophyllaxis SQ as appropriate  -mobilize as tolerated to help recovery.  Enlist therapies in moderate/high risk patients as appropriate.  Will set up home health PT and OT since he needs some help but does not want to go to skilled facility/assisted living at this time.  Some of family involved  I updated the patient's status to the patient and nurse  Recommendations were made.  Questions were answered.  They expressed understanding & appreciation.  -Disposition:  Disposition:  The patient is from: Home Anticipate discharge to:  Home with Home Health Anticipated Date of Discharge is:  May 13,2025   Barriers to discharge:  Pending Clinical improvement (more likely than not) and Therapy assessment & Recommendations pending  Patient currently is NOT MEDICALLY STABLE for discharge  from the hospital from a surgery standpoint.      I reviewed nursing notes, last 24 h vitals and pain scores, last 48 h intake and output, last 24 h labs and trends, and last 24 h imaging results.   I have reviewed this patient's available data, including medical history, events of note, test results, etc as part of my evaluation.   A significant portion of that time was spent in counseling. Care during the described time interval was provided by me.  This care required moderate level of medical decision making.  12/30/2023    Subjective: (Chief complaint)  Patient was confused and agitated.  We are sitting up in chair.  Denies much pain  Some desatting but will keep oxygen on.  Denies any nausea.  Not certain if he is had flatus and no bowel movements but tolerating full or liquids.  Wanting to eat and go home.  Objective:  Vital signs:  Vitals:   12/29/23 0603 12/29/23 2100 12/30/23 0630 12/30/23 0713  BP:  (!) 135/45 (!) 147/63   Pulse: 81 88 83   Resp: 17 19 18    Temp:  97.9 F (36.6 C) 98.4 F (36.9 C)   TempSrc:  Oral Oral   SpO2: 90% 90% 92%   Weight:    112.7 kg  Height:        Last BM Date : 12/25/23  Intake/Output   Yesterday:  05/11 0701 - 05/12 0700 In: 560 [P.O.:560] Out: 345 [Urine:250; Drains:95] This shift:  No intake/output data recorded.  Bowel function:  Flatus: YES  BM:  YES  Drain: Serosanguinous   Physical Exam:  General: Pt awake/alert in no acute distress.  Answers questions appropriately.  Not confused. Eyes: PERRL, normal EOM.  Sclera clear.  No icterus Neuro: CN II-XII intact w/o focal sensory/motor deficits. Lymph: No head/neck/groin lymphadenopathy Psych:  No delerium/psychosis/paranoia.  Oriented x 4 HENT: Normocephalic, Mucus membranes moist.  No thrush.  Still with baseline mumbling a challenge to comprehend. Neck: Supple, No tracheal deviation.  No obvious thyromegaly Chest: No pain to chest wall compression.  Good respiratory excursion.  No audible wheezing CV:  Pulses intact.  Regular rhythm.  No major extremity edema MS: Normal AROM mjr joints.  No obvious deformity  Abdomen: Morbidly obese but Soft.   Nondistended.  Mildly tender at incisions only.  Wicks out of colostomy incision.  Normal healing ridge.  No evidence of peritonitis.  No incarcerated hernias.  Ext:   No deformity.  No mjr edema.  No cyanosis Skin: No petechiae / purpurea.  No major sores.  Warm and dry    Results:   Cultures: No results found for this or any previous visit (from the past 720 hours).  Labs: Results for orders placed or performed during the hospital encounter of 12/26/23 (from the past 48 hours)  Glucose, capillary     Status: Abnormal   Collection Time: 12/28/23 11:39 AM  Result Value Ref Range   Glucose-Capillary 153 (H) 70 - 99 mg/dL    Comment: Glucose reference range applies only to samples taken after fasting for at least 8 hours.  Glucose, capillary     Status: Abnormal   Collection Time: 12/28/23  4:37 PM  Result Value Ref Range   Glucose-Capillary 130 (H) 70 - 99 mg/dL    Comment: Glucose reference range applies only to samples taken after fasting for at least 8 hours.  Glucose, capillary     Status: Abnormal   Collection Time:  12/28/23  9:02 PM  Result Value Ref Range   Glucose-Capillary 122 (H) 70 - 99 mg/dL    Comment: Glucose reference range applies only to samples taken after fasting for at least 8 hours.  Basic metabolic panel     Status: Abnormal   Collection Time: 12/29/23  5:22 AM  Result Value Ref Range   Sodium 137 135 - 145 mmol/L   Potassium 4.4 3.5 - 5.1 mmol/L   Chloride 104 98 - 111 mmol/L   CO2 29 22 - 32 mmol/L   Glucose, Bld 111 (H) 70 - 99 mg/dL    Comment: Glucose reference range applies only to samples taken after fasting for at least 8 hours.   BUN 40 (H) 8 - 23 mg/dL   Creatinine, Ser 0.45 (H) 0.61 - 1.24 mg/dL   Calcium  7.9 (L) 8.9 - 10.3 mg/dL   GFR, Estimated 42 (L) >60 mL/min    Comment: (NOTE) Calculated using the CKD-EPI Creatinine Equation (2021)    Anion gap 4 (L) 5 - 15    Comment: Performed at [facility], [address], [place], [place] [address]  CBC     Status: Abnormal   Collection Time: 12/29/23  5:22 AM  Result Value Ref Range   WBC 7.8 4.0 - 10.5 K/uL   RBC 3.47 (L) 4.22 - 5.81 MIL/uL   Hemoglobin 9.7 (L) 13.0 - 17.0 g/dL   HCT 19.1 (L) 47.8 - 29.5 %   MCV 93.7 80.0 - 100.0 fL   MCH 28.0 26.0 - 34.0 pg   MCHC 29.8 (L) 30.0 - 36.0 g/dL   RDW 62.1 (H) 30.8 - 65.7 %   Platelets 97 (L) 150 - 400 K/uL    Comment: SPECIMEN CHECKED FOR CLOTS Immature Platelet Fraction may be clinically indicated, consider ordering this additional test [id number] REPEATED TO VERIFY PLATELET COUNT CONFIRMED BY SMEAR    nRBC 0.0 0.0 - 0.2 %    Comment: Performed at [facility], [address] [place], [place] [address]  Glucose, capillary     Status: Abnormal   Collection Time: 12/29/23  7:59 AM  Result Value Ref Range   Glucose-Capillary 104 (H) 70 - 99 mg/dL    Comment: Glucose reference range applies only to samples taken after fasting for at least 8 hours.  Glucose, capillary     Status: Abnormal   Collection Time: 12/29/23 11:27 AM  Result Value Ref Range   Glucose-Capillary 136 (H) 70 - 99 mg/dL    Comment: Glucose reference range applies only to samples taken after fasting for at least 8 hours.  Glucose, capillary     Status: Abnormal   Collection Time: 12/29/23  4:29 PM  Result Value Ref Range   Glucose-Capillary 117 (H) 70 - 99 mg/dL    Comment: Glucose reference range applies only to samples taken after fasting for at least 8 hours.  Glucose, capillary     Status: Abnormal   Collection Time: 12/29/23  9:10 PM  Result Value Ref Range   Glucose-Capillary 135 (H) 70 - 99 mg/dL    Comment: Glucose reference range applies only to samples taken after fasting for at least 8 hours.    Imaging / Studies: No results found.  Medications / Allergies: per chart  Antibiotics: Anti-infectives (From admission, onward)    Start     Dose/Rate Route Frequency Ordered Stop   12/26/23 2200   cefoTEtan  (CEFOTAN ) 2 g in sodium chloride  0.9 % 100  mL IVPB        2 g 200 mL/hr over 30 Minutes Intravenous Every 12 hours 12/26/23 1833 12/27/23 0001   12/26/23 1400  neomycin  (MYCIFRADIN ) tablet 1,000 mg  Status:  Discontinued       Placed in "And" Linked Group   1,000 mg Oral 3 times per day 12/26/23 0942 12/26/23 0950   12/26/23 1400  metroNIDAZOLE  (FLAGYL ) tablet 1,000 mg  Status:  Discontinued       Placed in "And" Linked Group   1,000 mg Oral 3 times per day 12/26/23 0942 12/26/23 0950   12/26/23 0945  cefoTEtan  (CEFOTAN ) 2 g in sodium chloride  0.9 % 100 mL IVPB        2 g 200 mL/hr over 30 Minutes Intravenous On call to O.R. 12/26/23 0942 12/26/23 1258         Note: Portions of this report may have been transcribed using voice recognition software. Every effort was made to ensure accuracy; however, inadvertent computerized transcription errors may be present.   Any transcriptional errors that result from this process are unintentional.    Eddye Goodie, MD, FACS, MASCRS Esophageal, Gastrointestinal & Colorectal Surgery Robotic and Minimally Invasive Surgery  [facility] [address] [place], [place] [address] [phone] Fax [phone] Main  CONTACT INFORMATION: Weekday (9AM-5PM): Call [facility] main office at [phone] Weeknight (5PM-9AM) or Weekend/Holiday: Check EPIC "Web Links" tab & use "AMION" (password " [email or web address]") for General Surgery [facility] coverage  Please, DO NOT use SecureChat  (it is not reliable communication to reach operating surgeons & will lead to a delay in care).   Epic staff messaging available for outptient concerns needing 1-2 business day response.      12/30/2023  7:44 AM

## 2023-12-30 NOTE — Progress Notes (Signed)
 Behavior: Pt has been alert, oriented to person. Disoriented to place, time, situation. He continued to try to get oob alone and out of chair alone (alarms in both the chair and bed were activated throughout shift). No falls. However requires constant reminding of staying in the bed/chair, not getting up alone due to risk of falls. Not following commands for any length of time greater than 10-20 minutes.  Currently sitting up in recliner with legs elevated with chair alarm activated. During the shift, he also was spitting on floor whenever and wherever he could. In addition, he was urinating before getting him to the bathroom and or before placing the urinal for use in his peri area, therefore, urinating on the bed and or the floor. He also had ripped the JP bulb from the line several times throughout the night. All of these events led to him getting bathed and or changed numerous times throughout the night.

## 2023-12-30 NOTE — Progress Notes (Signed)
 Occupational Therapy Treatment Patient Details Name: Bradley [name] MRN: [id number] DOB: 11/08/1950 Today's Date: 12/30/2023   History of present illness 73 year old male s/p ROBOTIC RECTOSIGMOID RESECTION (LAR) and TAKEDOWN OF END DESCENDING COLOSTOMY WITH ANASTOMOSIS on 12/26/23.  Pt with previous admission 06/11/23 from home with complete colon and bowel obstruction,  s/p colectomy with end colostomy. Pt decompensated post op and placed on vent with extubation 10/26 and discharged to SNF. PMH: DM and bipolar   OT comments  Patient seen for skilled OT session this am. Patient up in recliner agreeable to all activitity presented including RW amb to and from bathroom to commode set over toilet. Pt had been on RA with O2 sats checked after mobility with 88%; replaced O2 via [place] and O2 sats improved to 98%. Oral care standing sink side completed. Left recliner level with all safety needs and chair alarm set. Patient will benefit from continued inpatient follow up therapy, <3 hours/day unless patient has 24 hr S and assistance from caregivers and home services due to impulsivity and safety deficits.        If plan is discharge home, recommend the following:  A little help with walking and/or transfers;A little help with bathing/dressing/bathroom   Equipment Recommendations  BSC/3in1       Precautions / Restrictions Precautions Precautions: Fall Precaution/Restrictions Comments: R JP drain, monitor sats (nursing note reports patient pulled out JP drain earlier) Restrictions Weight Bearing Restrictions Per Provider Order: No       Mobility Bed Mobility Overal bed mobility:  (up in recliner)                  Transfers Overall transfer level: Needs assistance Equipment used: Rolling walker (2 wheels) Transfers: Sit to/from Stand, Bed to chair/wheelchair/BSC Sit to Stand: Contact guard assist     Step pivot transfers: Contact guard assist     General transfer comment: min  cues for hand placement     Balance Overall balance assessment: Mild deficits observed, not formally tested                                         ADL either performed or assessed with clinical judgement   ADL Overall ADL's : Needs assistance/impaired Eating/Feeding: Set up;Sitting   Grooming: Set up;Sitting   Upper Body Bathing: Set up;Sitting   Lower Body Bathing: Minimal assistance;Sitting/lateral leans;Sit to/from stand   Upper Body Dressing : Minimal assistance;Sitting   Lower Body Dressing: Moderate assistance;Sitting/lateral leans;Sit to/from stand   Toilet Transfer: Minimal assistance;Grab bars;BSC/3in1 (wide)           Functional mobility during ADLs: Rolling walker (2 wheels);Contact guard assist;Cueing for sequencing;Cueing for safety General ADL Comments: VC for safety and hand placement    Extremity/Trunk Assessment Upper Extremity Assessment Upper Extremity Assessment: Generalized weakness   Lower Extremity Assessment Lower Extremity Assessment: Generalized weakness        Vision   Vision Assessment?: No apparent visual deficits         Communication Communication Communication: Impaired Factors Affecting Communication: Hearing impaired   Cognition Arousal: Alert Behavior During Therapy: WFL for tasks assessed/performed Cognition: Cognition impaired     Awareness: Online awareness impaired Memory impairment (select all impairments): Short-term memory     OT - Cognition Comments: impulsive, limited judgement  Following commands: Impaired Following commands impaired: Follows multi-step commands inconsistently      Cueing   Cueing Techniques: Verbal cues, Tactile cues        General Comments O2 sats 88% on RA upon OT arrival with elevation in sats with O2 via [place] to 98%    Pertinent Vitals/ Pain       Pain Assessment Pain Assessment: Faces Faces Pain Scale: Hurts a little bit Pain Location:  abdomen Pain Descriptors / Indicators: Sore Pain Intervention(s): Monitored during session, Relaxation   Frequency  Min 2X/week        Progress Toward Goals  OT Goals(current goals can now be found in the care plan section)     Acute Rehab OT Goals Patient Stated Goal: to go home OT Goal Formulation: With patient Time For Goal Achievement: 01/11/24 Potential to Achieve Goals: Good ADL Goals Pt Will Perform Lower Body Dressing: with supervision Pt Will Transfer to Toilet: with supervision;ambulating Pt Will Perform Toileting - Clothing Manipulation and hygiene: with supervision  Plan         AM-PAC OT "6 Clicks" Daily Activity     Outcome Measure   Help from another person eating meals?: A Little Help from another person taking care of personal grooming?: A Little Help from another person toileting, which includes using toliet, bedpan, or urinal?: A Little Help from another person bathing (including washing, rinsing, drying)?: A Little Help from another person to put on and taking off regular upper body clothing?: A Little Help from another person to put on and taking off regular lower body clothing?: A Lot 6 Click Score: 17    End of Session Equipment Utilized During Treatment: Rolling walker (2 wheels)  OT Visit Diagnosis: Unsteadiness on feet (R26.81);Other abnormalities of gait and mobility (R26.89);Muscle weakness (generalized) (M62.81)   Activity Tolerance Patient tolerated treatment well   Patient Left in chair;with call bell/phone within reach;with chair alarm set   Nurse Communication Mobility status        Time: 1040-1100 OT Time Calculation (min): 20 min  Charges: OT General Charges $OT Visit: 1 Visit OT Treatments $Self Care/Home Management : 8-22 mins Jenean Escandon OT/L Acute Rehabilitation Department  [phone]  12/30/2023, 11:19 AM

## 2023-12-31 LAB — GLUCOSE, CAPILLARY: Glucose-Capillary: 101 mg/dL — ABNORMAL HIGH (ref 70–99)

## 2023-12-31 NOTE — Progress Notes (Signed)
 AVS reviewed w/ pt who verbalized an understanding. PIV removed as noted. Pt dressed for d/c. Pt will call a cab from the d/c area. His wife is not able to pick him up until 1800. No other questions at this time

## 2023-12-31 NOTE — Discharge Summary (Signed)
 Physician Discharge Summary    Hawthorne Barany MRN: [id number] DOB/AGE: [birth year] = 73 y.o.  Patient Care Team: Medicine, [facility] as PCP - General (Family Medicine) Almeda Jacobs, MD as Consulting Physician (Hematology and Oncology) Candyce Champagne, MD as Consulting Physician (Colon and Rectal Surgery) Armbruster, Lendon Queen, MD as Consulting Physician (Gastroenterology)  Admit date: 12/26/2023  Discharge date: 12/31/2023  Hospital Stay = 5 days    Discharge Diagnoses:  Principal Problem:   Colon stricture [facility]) Active Problems:   Hypertension   Diabetes mellitus, type II (HCC)   Schizoaffective disorder, bipolar type (HCC)   Vitamin D deficiency   Severe obesity (BMI 35.0-35.9 with comorbidity) (HCC)   History of adenomatous polyp of colon   CKD stage 3a, GFR 45-59 ml/min (HCC)   5 Days Post-Op  12/26/2023  POST-OPERATIVE DIAGNOSIS:   COLOSTOMY FOR COLON RESECTION, DESIRE FOR OSTOMY TAKEDOWN  SURGERY:  12/26/2023  Procedure(s): ROBOTIC RECTOSIGMOID RESECTION (LAR) ; TAKEDOWN OF END DESCENDING COLOSTOMY WITH ANASTOMOSIS  ;ROBOTIC LYSIS OF ADHESIONS  TRANSVERSUS ABDOMINIS PLANE (TAP) BLOCK - BILATERAL REPAIR, SMALL INTESTINE SIGMOIDOSCOPY, FLEXIBLE APPENDECTOMY, ROBOT-ASSISTED, LAPAROSCOPIC  SURGEON:    Surgeon(s): Candyce Champagne, MD Joyce Nixon, MD  Consults: Case Management / Social Work, Physical Therapy, Occupational Therapy, and Anesthesia  Hospital Course:   The patient underwent the surgery above.  Postoperatively, the patient gradually mobilized and advanced to a solid diet.  Pain and other symptoms were treated aggressively.    By the time of discharge, the patient was walking well the hallways, eating food, having flatus.  Pain was well-controlled on an oral medications.  Based on meeting discharge criteria and continuing to recover, I felt it was safe for the patient to be discharged from the hospital to further recover with close  followup. Postoperative recommendations were discussed in detail.  They are written as well.  Discharged Condition: good  Discharge Exam: Blood pressure (!) 169/81, pulse 76, temperature 98.4 F (36.9 C), temperature source Oral, resp. rate 18, height 5\' 8"  (1.727 m), weight 112.7 kg, SpO2 91%.  General: Pt awake/alert/oriented x4 in No acute distress Eyes: PERRL, normal EOM.  Sclera clear.  No icterus Neuro: CN II-XII intact w/o focal sensory/motor deficits. Lymph: No head/neck/groin lymphadenopathy Psych:  No delerium/psychosis/paranoia HENT: Normocephalic, Mucus membranes moist.  No thrush Neck: Supple, No tracheal deviation Chest:  No chest wall pain w good excursion CV:  Pulses intact.  Regular rhythm MS: Normal AROM mjr joints.  No obvious deformity Abdomen: Obese Soft.  Nondistended.  Nontender.  Left-sided old colostomy wound closed with small gaps in the skin only.  Good granulation at dermis.  Normal healing ridge.  No evidence of peritonitis.  No incarcerated hernias. Ext:  SCDs BLE.  No mjr edema.  No cyanosis Skin: No petechiae / purpura   Disposition:    Follow-up Information     Candyce Champagne, MD Follow up in 1 month(s).   Specialties: General Surgery, Colon and Rectal Surgery Why: To follow up after your hospital stay Contact information: [address] [place] [address] [phone]         [facility] Follow up.   Why: [facility] Physical Therapy/Occupational Therapy Contact information: [address] [place] [address] [phone]                 Discharge disposition: 01-Home or Self Care       Discharge Instructions  Call MD for:   Complete by: As directed    FEVER > 101.5 F  (temperatures < 101.5 F are not significant)   Call MD for:  extreme fatigue   Complete by: As directed    Call MD for:  persistant dizziness or light-headedness   Complete by: As  directed    Call MD for:  persistant nausea and vomiting   Complete by: As directed    Call MD for:  redness, tenderness, or signs of infection (pain, swelling, redness, odor or green/yellow discharge around incision site)   Complete by: As directed    Call MD for:  severe uncontrolled pain   Complete by: As directed    Diet - low sodium heart healthy   Complete by: As directed    Start with a bland diet such as soups, liquids, starchy foods, low fat foods, etc. the first few days at home. Gradually advance to a solid, low-fat, high fiber diet by the end of the first week at home.   Add a fiber supplement to your diet (Metamucil, etc) If you feel full, bloated, or constipated, stay on a full liquid or pureed/blenderized diet for a few days until you feel better and are no longer constipated.   Discharge instructions   Complete by: As directed    See Discharge Instructions If you are not getting better after two weeks or are noticing you are getting worse, contact our office [phone] for further advice.  We may need to adjust your medications, re-evaluate you in the office, send you to the emergency room, or see what other things we can do to help. The clinic staff is available to answer your questions during regular business hours (8:30am-5pm).  Please don't hesitate to call and ask to speak to one of our nurses for clinical concerns.    A surgeon from [facility] is always on call at the hospitals 24 hours/day If you have a medical emergency, go to the nearest emergency room or call 911.   Discharge wound care:   Complete by: As directed    It is good for closed incisions and even open wounds to be washed every day.  Shower every day.  Short baths are fine.  Wash the incisions and wounds clean with soap & water .    You may leave closed incisions open to air if it is dry.   You may cover the incision with clean gauze & replace it after your daily shower for  comfort.  TEGADERM:  You have clear gauze band-aid dressings over your closed incision(s).  Remove the dressings 2 days after surgery = Saturday 5/10.  Make sure shoelace wick out of old colostomy wound as well   Driving Restrictions   Complete by: As directed    You may drive when: - you are no longer taking narcotic prescription pain medication - you can comfortably wear a seatbelt - you can safely make sudden turns/stops without pain.   Increase activity slowly   Complete by: As directed    Start light daily activities --- self-care, walking, climbing stairs- beginning the day after surgery.  Gradually increase activities as tolerated.  Control your pain to be active.  Stop when you are tired.  Ideally, walk several times a day, eventually an hour a day.   Most people are back to most day-to-day activities in a few weeks.  It takes 4-6 weeks to get back to unrestricted, intense activity. If you can walk  30 minutes without difficulty, it is safe to try more intense activity such as jogging, treadmill, bicycling, low-impact aerobics, swimming, etc. Save the most intensive and strenuous activity for last (Usually 4-8 weeks after surgery) such as sit-ups, heavy lifting, contact sports, etc.  Refrain from any intense heavy lifting or straining until you are off narcotics for pain control.  You will have off days, but things should improve week-by-week. DO NOT PUSH THROUGH PAIN.  Let pain be your guide: If it hurts to do something, don't do it.   Lifting restrictions   Complete by: As directed    If you can walk 30 minutes without difficulty, it is safe to try more intense activity such as jogging, treadmill, bicycling, low-impact aerobics, swimming, etc. Save the most intensive and strenuous activity for last (Usually 4-8 weeks after surgery) such as sit-ups, heavy lifting, contact sports, etc.   Refrain from any intense heavy lifting or straining until you are off narcotics for pain control.  You  will have off days, but things should improve week-by-week. DO NOT PUSH THROUGH PAIN.  Let pain be your guide: If it hurts to do something, don't do it.  Pain is your body warning you to avoid that activity for another week until the pain goes down.   May shower / Bathe   Complete by: As directed    May walk up steps   Complete by: As directed    Remove dressing in 48 hours   Complete by: As directed    Make sure all dressings have been removed on the second day after surgery = 5/10 Saturday Leave incisions open to air.  OK to cover incisions with gauze or bandages as desired   Sexual Activity Restrictions   Complete by: As directed    You may have sexual intercourse when it is comfortable. If it hurts to do something, stop.       Allergies as of 12/31/2023   No Known Allergies      Medication List     TAKE these medications    albuterol  108 (90 Base) MCG/ACT inhaler Commonly known as: VENTOLIN  HFA Inhale 2 puffs into the lungs every 6 (six) hours as needed for wheezing or shortness of breath.   amLODipine  10 MG tablet Commonly known as: NORVASC  Take 10 mg by mouth at bedtime.   divalproex  500 MG 24 hr tablet Commonly known as: DEPAKOTE  ER Take 1,000 mg by mouth at bedtime.   hydrochlorothiazide  50 MG tablet Commonly known as: HYDRODIURIL  Take 50 mg by mouth at bedtime.   lisinopril  40 MG tablet Commonly known as: ZESTRIL  Take 40 mg by mouth at bedtime.   pravastatin  40 MG tablet Commonly known as: PRAVACHOL  Take 1 tablet (40 mg total) by mouth every evening.   risperiDONE  2 MG tablet Commonly known as: RISPERDAL  Take 2 mg by mouth at bedtime.   traMADol  50 MG tablet Commonly known as: ULTRAM  Take 1-2 tablets (50-100 mg total) by mouth every 6 (six) hours as needed for moderate pain (pain score 4-6) or severe pain (pain score 7-10).               Durable Medical Equipment  (From admission, onward)           Start     Ordered   12/30/23 1425   For home use only DME Bedside commode  Once       Question:  Patient needs a bedside commode to treat with the following condition  Answer:  Balance disorder   12/30/23 1424   12/30/23 0740  For home use only DME Walker rolling  Once       Comments: To help patient transfer and ambulate.  Physical / Occupational Therapy may change type of walker PRN.  Question Answer Comment  Walker: With 5 Inch Wheels   Patient needs a walker to treat with the following condition Balance problem      12/30/23 0739              Discharge Care Instructions  (From admission, onward)           Start     Ordered   12/27/23 0000  Discharge wound care:       Comments: It is good for closed incisions and even open wounds to be washed every day.  Shower every day.  Short baths are fine.  Wash the incisions and wounds clean with soap & water .    You may leave closed incisions open to air if it is dry.   You may cover the incision with clean gauze & replace it after your daily shower for comfort.  TEGADERM:  You have clear gauze band-aid dressings over your closed incision(s).  Remove the dressings 2 days after surgery = Saturday 5/10.  Make sure shoelace wick out of old colostomy wound as well   12/27/23 0808            Significant Diagnostic Studies:  Results for orders placed or performed during the hospital encounter of 12/26/23 (from the past 72 hours)  Glucose, capillary     Status: Abnormal   Collection Time: 12/28/23 11:39 AM  Result Value Ref Range   Glucose-Capillary 153 (H) 70 - 99 mg/dL    Comment: Glucose reference range applies only to samples taken after fasting for at least 8 hours.  Glucose, capillary     Status: Abnormal   Collection Time: 12/28/23  4:37 PM  Result Value Ref Range   Glucose-Capillary 130 (H) 70 - 99 mg/dL    Comment: Glucose reference range applies only to samples taken after fasting for at least 8 hours.  Glucose, capillary     Status: Abnormal    Collection Time: 12/28/23  9:02 PM  Result Value Ref Range   Glucose-Capillary 122 (H) 70 - 99 mg/dL    Comment: Glucose reference range applies only to samples taken after fasting for at least 8 hours.  Basic metabolic panel     Status: Abnormal   Collection Time: 12/29/23  5:22 AM  Result Value Ref Range   Sodium 137 135 - 145 mmol/L   Potassium 4.4 3.5 - 5.1 mmol/L   Chloride 104 98 - 111 mmol/L   CO2 29 22 - 32 mmol/L   Glucose, Bld 111 (H) 70 - 99 mg/dL    Comment: Glucose reference range applies only to samples taken after fasting for at least 8 hours.   BUN 40 (H) 8 - 23 mg/dL   Creatinine, Ser 1.61 (H) 0.61 - 1.24 mg/dL   Calcium  7.9 (L) 8.9 - 10.3 mg/dL   GFR, Estimated 42 (L) >60 mL/min    Comment: (NOTE) Calculated using the CKD-EPI Creatinine Equation (2021)    Anion gap 4 (L) 5 - 15    Comment: Performed at [facility], [address] [place], [place] [address]  CBC     Status: Abnormal   Collection Time: 12/29/23  5:22 AM  Result Value Ref Range  WBC 7.8 4.0 - 10.5 K/uL   RBC 3.47 (L) 4.22 - 5.81 MIL/uL   Hemoglobin 9.7 (L) 13.0 - 17.0 g/dL   HCT 16.1 (L) 09.6 - 04.5 %   MCV 93.7 80.0 - 100.0 fL   MCH 28.0 26.0 - 34.0 pg   MCHC 29.8 (L) 30.0 - 36.0 g/dL   RDW 40.9 (H) 81.1 - 91.4 %   Platelets 97 (L) 150 - 400 K/uL    Comment: SPECIMEN CHECKED FOR CLOTS Immature Platelet Fraction may be clinically indicated, consider ordering this additional test [id number] REPEATED TO VERIFY PLATELET COUNT CONFIRMED BY SMEAR    nRBC 0.0 0.0 - 0.2 %    Comment: Performed at [facility], [address] [place], [place] [address]  Glucose, capillary     Status: Abnormal   Collection Time: 12/29/23  7:59 AM  Result Value Ref Range   Glucose-Capillary 104 (H) 70 - 99 mg/dL    Comment: Glucose reference range applies only to samples taken after fasting for at least 8 hours.  Glucose, capillary     Status: Abnormal   Collection Time:  12/29/23 11:27 AM  Result Value Ref Range   Glucose-Capillary 136 (H) 70 - 99 mg/dL    Comment: Glucose reference range applies only to samples taken after fasting for at least 8 hours.  Glucose, capillary     Status: Abnormal   Collection Time: 12/29/23  4:29 PM  Result Value Ref Range   Glucose-Capillary 117 (H) 70 - 99 mg/dL    Comment: Glucose reference range applies only to samples taken after fasting for at least 8 hours.  Glucose, capillary     Status: Abnormal   Collection Time: 12/29/23  9:10 PM  Result Value Ref Range   Glucose-Capillary 135 (H) 70 - 99 mg/dL    Comment: Glucose reference range applies only to samples taken after fasting for at least 8 hours.  Glucose, capillary     Status: Abnormal   Collection Time: 12/30/23  7:57 AM  Result Value Ref Range   Glucose-Capillary 116 (H) 70 - 99 mg/dL    Comment: Glucose reference range applies only to samples taken after fasting for at least 8 hours.  Glucose, capillary     Status: Abnormal   Collection Time: 12/30/23 11:23 AM  Result Value Ref Range   Glucose-Capillary 172 (H) 70 - 99 mg/dL    Comment: Glucose reference range applies only to samples taken after fasting for at least 8 hours.  Glucose, capillary     Status: Abnormal   Collection Time: 12/30/23  4:17 PM  Result Value Ref Range   Glucose-Capillary 107 (H) 70 - 99 mg/dL    Comment: Glucose reference range applies only to samples taken after fasting for at least 8 hours.  Glucose, capillary     Status: Abnormal   Collection Time: 12/30/23  9:01 PM  Result Value Ref Range   Glucose-Capillary 129 (H) 70 - 99 mg/dL    Comment: Glucose reference range applies only to samples taken after fasting for at least 8 hours.  Glucose, capillary     Status: Abnormal   Collection Time: 12/31/23  7:17 AM  Result Value Ref Range   Glucose-Capillary 101 (H) 70 - 99 mg/dL    Comment: Glucose reference range applies only to samples taken after fasting for at least 8 hours.     No results found.  Past Medical History:  Diagnosis Date   Acute lower GI bleeding  07/26/2015   Arthritis    Bipolar 2 disorder (HCC) 2005   Also carries diagnosis of schizophrenia.   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      Past Surgical History:  Procedure Laterality Date   COLON RESECTION N/A 06/14/2023   Procedure: EXPLORATORY LAPAROTOMY; TAKEDOWN OF SPLENIC FLEXURE; LEFT HEMICOLECTOMY, COLOSTOMY;  Surgeon: Candyce Champagne, MD;  Location: [facility] ORS;  Service: General;  Laterality: N/A;   COLONOSCOPY  2009, 2015   ENTEROSCOPY N/A 07/28/2015   Procedure: ENTEROSCOPY;  Surgeon: Asencion Blacksmith, MD;  Location: [facility] ENDOSCOPY;  Service: Endoscopy;  Laterality: N/A;   FLEXIBLE SIGMOIDOSCOPY N/A 06/13/2023   Procedure: FLEXIBLE SIGMOIDOSCOPY;  Surgeon: Ace Holder, MD;  Location: [facility] ENDOSCOPY;  Service: Gastroenterology;  Laterality: N/A;   FLEXIBLE SIGMOIDOSCOPY  12/26/2023   Procedure: Marlynn Singer;  Surgeon: Candyce Champagne, MD;  Location: [facility];  Service: General;;   SMALL BOWEL REPAIR  12/26/2023   Procedure: REPAIR, SMALL INTESTINE;  Surgeon: Candyce Champagne, MD;  Location: [facility];  Service: General;;   SUBMUCOSAL TATTOO INJECTION  06/13/2023   Procedure: SUBMUCOSAL TATTOO INJECTION;  Surgeon: Ace Holder, MD;  Location: [facility] ENDOSCOPY;  Service: Gastroenterology;;   XI ROBOTIC ASSISTED COLOSTOMY TAKEDOWN N/A 12/26/2023   Procedure: ROBOTIC RECTOSIGMOID RESECTION (LAR) ;  TAKEDOWN OF END DESCENDING COLOSTOMY WITH ANASTOMOSIS  ;ROBOTIC LYSIS OF ADHESIONS  TRANSVERSUS ABDOMINIS PLANE (TAP) BLOCK - BILATERAL;  Surgeon: Candyce Champagne, MD;  Location: [facility];  Service: General;  Laterality: N/A;  LYSIS OF ADHESIONS   XI ROBOTIC LAPAROSCOPIC ASSISTED APPENDECTOMY  12/26/2023   Procedure: APPENDECTOMY, ROBOT-ASSISTED, LAPAROSCOPIC;  Surgeon: Candyce Champagne, MD;  Location: [facility];  Service: General;;    Social History   Socioeconomic History   Marital status: Divorced    Spouse name: Not on file   Number of children: Not on file   Years of education: Not on file   Highest education level: Not on file  Occupational History   Not on file  Tobacco Use   Smoking status: Every Day    Current packs/day: 1.00    Average packs/day: 1 pack/day for 20.0 years (20.0 ttl pk-yrs)    Types: Cigarettes   Smokeless tobacco: Never  Vaping Use   Vaping status: Former  Substance and Sexual Activity   Alcohol use: No   Drug use: No   Sexual activity: Not Currently  Other Topics Concern   Not on file  Social History Narrative   Patient originally from [place].   Social Drivers of Corporate investment banker Strain: Not on File (12/07/2021)   Received from [employer] [employer] Strain    Financial Resource Strain: 0  Food Insecurity: Patient Declined (12/26/2023)   Hunger Vital Sign    Worried About Running Out of Food in the Last Year: Patient declined    Ran Out of Food in the Last Year: Patient declined  Transportation Needs: Patient Declined (12/26/2023)   PRAPARE -  Administrator, Civil Service (Medical): Patient declined    Lack of Transportation (Non-Medical): Patient declined  Physical Activity: Not on File (12/07/2021)   Received from [place], [place]   Physical Activity    Physical Activity: 0  Stress: Not on File (12/07/2021)   Received from [facility], [place]   Stress    Stress: 0  Social Connections: Patient Declined (12/26/2023)   Social  Connection and Isolation Panel [NHANES]    Frequency of Communication with Friends and Family: Patient declined    Frequency of Social Gatherings with Friends and Family: Patient declined    Attends Religious Services: Patient declined    Database administrator or Organizations: Patient declined    Attends Banker Meetings: Patient declined    Marital Status: Patient declined  Intimate Partner Violence: Patient Declined (12/26/2023)   Humiliation, Afraid, Rape, and Kick questionnaire    Fear of Current or Ex-Partner: Patient declined    Emotionally Abused: Patient declined    Physically Abused: Patient declined    Sexually Abused: Patient declined    Family History  Problem Relation Age of Onset   Cancer Brother        abdominal CA   Colon cancer Neg Hx    Esophageal cancer Neg Hx    Rectal cancer Neg Hx    Stomach cancer Neg Hx     Current Facility-Administered Medications  Medication Dose Route Frequency Provider Last Rate Last Admin   0.9 %  sodium chloride  infusion  250 mL Intravenous PRN Candyce Champagne, MD       acetaminophen  (TYLENOL ) tablet 1,000 mg  1,000 mg Oral Q6H Trevyon Swor, MD   1,000 mg at 12/30/23 0603   albuterol  (PROVENTIL ) (2.5 MG/3ML) 0.083% nebulizer solution 2.5 mg  2.5 mg Inhalation Q6H PRN Candyce Champagne, MD   2.5 mg at 12/28/23 0526   alum & mag hydroxide-simeth (MAALOX/MYLANTA) 200-200-20 MG/5ML suspension 30 mL  30 mL Oral Q6H PRN Candyce Champagne, MD       alvimopan  (ENTEREG ) capsule 12 mg  12 mg Oral BID Candyce Champagne, MD   12 mg at 12/30/23 2143   amLODipine  (NORVASC ) tablet 10 mg  10 mg Oral QHS Juli Odom, MD   10 mg at 12/30/23 2143   cyclobenzaprine (FLEXERIL) tablet 5-10 mg  5-10 mg Oral TID PRN Candyce Champagne, MD       diphenhydrAMINE  (BENADRYL ) 12.5 MG/5ML elixir 12.5 mg  12.5 mg Oral Q6H PRN Candyce Champagne, MD       Or   diphenhydrAMINE  (BENADRYL ) injection 12.5 mg  12.5 mg Intravenous Q6H PRN Candyce Champagne, MD       divalproex   (DEPAKOTE  ER) 24 hr tablet 1,000 mg  1,000 mg Oral QHS Crispin Vogel, MD   1,000 mg at 12/30/23 2143   enoxaparin  (LOVENOX ) injection 40 mg  40 mg Subcutaneous Q24H Candyce Champagne, MD   40 mg at 12/30/23 0803   feeding supplement (ENSURE SURGERY) liquid 237 mL  237 mL Oral BID BM Candyce Champagne, MD   237 mL at 12/27/23 1107   gabapentin  (NEURONTIN ) capsule 200 mg  200 mg Oral QHS Hubert Derstine, MD   200 mg at 12/30/23 2143   hydrALAZINE  (APRESOLINE ) injection 10 mg  10 mg Intravenous Q2H PRN Candyce Champagne, MD       hydrochlorothiazide  (HYDRODIURIL ) tablet 50 mg  50 mg Oral QHS Cindy Brindisi, MD   50 mg at 12/30/23 0803   HYDROmorphone  (DILAUDID ) injection 0.5-2 mg  0.5-2 mg Intravenous Q4H PRN Candyce Champagne, MD       insulin  aspart (novoLOG ) injection 0-15 Units  0-15 Units Subcutaneous TID WC Candyce Champagne, MD   3 Units at 12/30/23 1130   insulin  aspart (novoLOG ) injection 0-5 Units  0-5 Units Subcutaneous QHS Jerremy Maione, MD       magic mouthwash  15 mL Oral QID PRN Candyce Champagne, MD       melatonin tablet 3 mg  3 mg Oral QHS PRN Candyce Champagne, MD   3 mg at 12/29/23 2206   menthol -cetylpyridinium (CEPACOL) lozenge 3 mg  1 lozenge Oral PRN Candyce Champagne, MD       metoprolol  tartrate (LOPRESSOR ) injection 5 mg  5 mg Intravenous Q6H PRN Candyce Champagne, MD       naphazoline-glycerin  (CLEAR EYES REDNESS) ophth solution 1-2 drop  1-2 drop Both Eyes QID PRN Candyce Champagne, MD       ondansetron  (ZOFRAN ) tablet 4 mg  4 mg Oral Q6H PRN Candyce Champagne, MD       Or   ondansetron  (ZOFRAN ) injection 4 mg  4 mg Intravenous Q6H PRN Candyce Champagne, MD       phenol (CHLORASEPTIC) mouth spray 2 spray  2 spray Mouth/Throat PRN Candyce Champagne, MD       polycarbophil (FIBERCON) tablet 1,250 mg  1,250 mg Oral BID Candyce Champagne, MD   1,250 mg at 12/30/23 2143   pravastatin  (PRAVACHOL ) tablet 40 mg  40 mg Oral QPM Candyce Champagne, MD   40 mg at 12/30/23 1622   prochlorperazine  (COMPAZINE ) tablet 10 mg  10 mg Oral Q6H PRN  Candyce Champagne, MD       Or   prochlorperazine  (COMPAZINE ) injection 5-10 mg  5-10 mg Intravenous Q6H PRN Candyce Champagne, MD       risperiDONE  (RISPERDAL ) tablet 2 mg  2 mg Oral QHS Annell Canty, MD   2 mg at 12/30/23 2143   simethicone  (MYLICON) chewable tablet 40 mg  40 mg Oral Q6H PRN Candyce Champagne, MD       sodium chloride  (OCEAN) 0.65 % nasal spray 1-2 spray  1-2 spray Each Nare Q6H PRN Candyce Champagne, MD       sodium chloride  flush (NS) 0.9 % injection 3 mL  3 mL Intravenous Q12H Kadarius Cuffe, MD   3 mL at 12/30/23 2144   sodium chloride  flush (NS) 0.9 % injection 3 mL  3 mL Intravenous PRN Candyce Champagne, MD       traMADol  (ULTRAM ) tablet 50-100 mg  50-100 mg Oral Q6H PRN Candyce Champagne, MD         No Known Allergies  Signed:   Eddye Goodie, MD, FACS, MASCRS Esophageal, Gastrointestinal & Colorectal Surgery Robotic and Minimally Invasive Surgery  [facility] [address] [place], [place] [address] [phone] Fax [phone] Main  CONTACT INFORMATION: Weekday (9AM-5PM): Call [facility] main office at [phone] Weeknight (5PM-9AM) or Weekend/Holiday: Check EPIC "Web Links" tab & use "AMION" (password " [email or web address]") for General Surgery [facility] coverage  Please, DO NOT use SecureChat  (it is not reliable communication to reach operating surgeons & will lead to a delay in care).   Epic staff messaging available for outptient concerns needing 1-2 business day response.      12/31/2023, 7:41 AM

## 2024-01-01 ENCOUNTER — Ambulatory Visit: Payer: Self-pay | Admitting: Gastroenterology

## 2024-01-01 ENCOUNTER — Ambulatory Visit: Payer: Self-pay | Admitting: Surgery

## 2024-01-01 LAB — SURGICAL PATHOLOGY

## 2024-01-06 NOTE — Anesthesia Postprocedure Evaluation (Signed)
 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  ;ROBOTIC LYSIS OF ADHESIONS  TRANSVERSUS ABDOMINIS PLANE (TAP) BLOCK - BILATERAL REPAIR, SMALL INTESTINE SIGMOIDOSCOPY, FLEXIBLE APPENDECTOMY, ROBOT-ASSISTED, LAPAROSCOPIC     Patient location during evaluation: PACU Anesthesia Type: General Level of consciousness: awake and alert Pain management: pain level controlled Vital Signs Assessment: post-procedure vital signs reviewed and stable Respiratory status: spontaneous breathing, nonlabored ventilation, respiratory function stable and patient connected to nasal cannula oxygen Cardiovascular status: blood pressure returned to baseline and stable Postop Assessment: no apparent nausea or vomiting Anesthetic complications: no   No notable events documented.              Leslye Rast

## 2024-01-23 ENCOUNTER — Other Ambulatory Visit: Payer: Self-pay

## 2024-01-23 ENCOUNTER — Emergency Department (HOSPITAL_COMMUNITY)
Admission: EM | Admit: 2024-01-23 | Discharge: 2024-01-23 | Discharge status: Against Medical Advice | Attending: Emergency Medicine | Admitting: Emergency Medicine

## 2024-01-23 DIAGNOSIS — K5900 Constipation, unspecified: Secondary | ICD-10-CM | POA: Insufficient documentation

## 2024-01-23 DIAGNOSIS — Z5321 Procedure and treatment not carried out due to patient leaving prior to being seen by health care provider: Secondary | ICD-10-CM | POA: Insufficient documentation

## 2024-01-23 NOTE — ED Notes (Signed)
 Pt advised sort staff "I will come next time." And left the ED

## 2024-01-23 NOTE — ED Triage Notes (Signed)
 Pt. Stated, Bradley [name] not had a bowel movement in 3 days. DEnies any other symptoms.

## 2024-04-11 ENCOUNTER — Other Ambulatory Visit: Payer: Self-pay | Admitting: Internal Medicine

## 2024-04-13 ENCOUNTER — Telehealth: Payer: Self-pay

## 2024-04-13 NOTE — Telephone Encounter (Signed)
 [facility] pharmacy is requesting a refill on medication nebivolol  10 mg tablet. This medication was D/C off of pt's medication list. Does pt still suppose to be taking this medication?

## 2024-04-13 NOTE — Telephone Encounter (Signed)
 Left message for pt to call  Spoke with the pharmacy staff, aware the patient has not been seen since 03/2023.

## 2024-04-16 NOTE — Telephone Encounter (Signed)
 Left message for pt to call.

## 2024-05-04 ENCOUNTER — Encounter: Payer: Self-pay | Admitting: Internal Medicine

## 2024-05-11 ENCOUNTER — Other Ambulatory Visit: Payer: Self-pay | Admitting: Cardiology

## 2024-06-10 ENCOUNTER — Other Ambulatory Visit: Payer: Self-pay | Admitting: Physician Assistant

## 2024-06-15 ENCOUNTER — Emergency Department (HOSPITAL_COMMUNITY)

## 2024-06-15 ENCOUNTER — Emergency Department (HOSPITAL_COMMUNITY)
Admission: EM | Admit: 2024-06-15 | Discharge: 2024-06-16 | Disposition: A | Discharge status: Home / Self Care | Attending: Emergency Medicine | Admitting: Emergency Medicine

## 2024-06-15 ENCOUNTER — Other Ambulatory Visit: Payer: Self-pay

## 2024-06-15 DIAGNOSIS — E119 Type 2 diabetes mellitus without complications: Secondary | ICD-10-CM | POA: Insufficient documentation

## 2024-06-15 DIAGNOSIS — I10 Essential (primary) hypertension: Secondary | ICD-10-CM | POA: Insufficient documentation

## 2024-06-15 DIAGNOSIS — M109 Gout, unspecified: Secondary | ICD-10-CM | POA: Insufficient documentation

## 2024-06-15 DIAGNOSIS — Z79899 Other long term (current) drug therapy: Secondary | ICD-10-CM | POA: Diagnosis not present

## 2024-06-15 DIAGNOSIS — M79671 Pain in right foot: Secondary | ICD-10-CM | POA: Diagnosis present

## 2024-06-15 LAB — CBC
HCT: 46.4 % (ref 39.0–52.0)
Hemoglobin: 14.4 g/dL (ref 13.0–17.0)
MCH: 27.7 pg (ref 26.0–34.0)
MCHC: 31 g/dL (ref 30.0–36.0)
MCV: 89.2 fL (ref 80.0–100.0)
Platelets: 130 K/uL — ABNORMAL LOW (ref 150–400)
RBC: 5.2 MIL/uL (ref 4.22–5.81)
RDW: 17.4 % — ABNORMAL HIGH (ref 11.5–15.5)
WBC: 6.7 K/uL (ref 4.0–10.5)
nRBC: 0 % (ref 0.0–0.2)

## 2024-06-15 LAB — BASIC METABOLIC PANEL WITH GFR
Anion gap: 12 (ref 5–15)
BUN: 20 mg/dL (ref 8–23)
CO2: 23 mmol/L (ref 22–32)
Calcium: 9.2 mg/dL (ref 8.9–10.3)
Chloride: 100 mmol/L (ref 98–111)
Creatinine, Ser: 1.17 mg/dL (ref 0.61–1.24)
GFR, Estimated: >60 mL/min (ref >60)
Glucose, Bld: 85 mg/dL (ref 70–99)
Potassium: 4.4 mmol/L (ref 3.5–5.1)
Sodium: 135 mmol/L (ref 135–145)

## 2024-06-15 LAB — D-DIMER, QUANTITATIVE: D-Dimer, Quant: 0.9 ug{FEU}/mL — ABNORMAL HIGH (ref 0.00–0.50)

## 2024-06-15 MED ORDER — KETOROLAC TROMETHAMINE 30 MG/ML IJ SOLN
30.0000 mg | Freq: Once | INTRAMUSCULAR | Status: AC
  Administered 2024-06-15: 30 mg via INTRAMUSCULAR
  Filled 2024-06-15: qty 1

## 2024-06-15 NOTE — ED Provider Notes (Signed)
 [place] EMERGENCY DEPARTMENT AT [facility] Provider Note   CSN: [id number] Arrival date & time: 06/15/24  1609     Patient presents with: No chief complaint on file.   Bradley [name] is a 73 y.o. male.  {Add pertinent medical, surgical, social history, OB history to HPI:32947} HPI     This is a 72 year old male who presents with right foot pain and swelling.  Onset of symptoms several days ago.  Denies any injury.  No known history of gout or inflammatory arthritis.  Reporting pain mostly in the right great toe and in the dorsum of the foot.  Has been ambulatory.  Normally wears crocs.  Denies any history of blood clots or calf pain.  Denies fevers.  Prior to Admission medications   Medication Sig Start Date End Date Taking? Authorizing Provider  albuterol  (VENTOLIN  HFA) 108 (90 Base) MCG/ACT inhaler Inhale 2 puffs into the lungs every 6 (six) hours as needed for wheezing or shortness of breath. 11/04/23   [provider]  amLODipine  (NORVASC ) 10 MG tablet Take 10 mg by mouth at bedtime. 10/14/23   [provider]  divalproex  (DEPAKOTE  ER) 500 MG 24 hr tablet Take 1,000 mg by mouth at bedtime.    [provider]  hydrochlorothiazide  (HYDRODIURIL ) 50 MG tablet Take 50 mg by mouth at bedtime.    [provider]  lisinopril  (ZESTRIL ) 40 MG tablet Take 40 mg by mouth at bedtime. 07/22/23   [provider]  pravastatin  (PRAVACHOL ) 40 MG tablet TAKE 1 TABLET(40 MG) BY MOUTH EVERY EVENING 06/12/24   Weaver, Scott T, PA-C  risperiDONE  (RISPERDAL ) 2 MG tablet Take 2 mg by mouth at bedtime. 11/08/23   [provider]  traMADol  (ULTRAM ) 50 MG tablet Take 1-2 tablets (50-100 mg total) by mouth every 6 (six) hours as needed for moderate pain (pain score 4-6) or severe pain (pain score 7-10). 12/27/23   Sheldon Standing, MD    Allergies: Patient has no known allergies.    Review of Systems  Constitutional:  Negative for fever.   Musculoskeletal:        Foot pain and swelling  All other systems reviewed and are negative.   Updated Vital Signs BP (!) 140/78 (BP Location: Left Arm)   Pulse 70   Temp 97.9 F (36.6 C) (Oral)   Resp 20   Ht 1.727 m (5' 8)   Wt 108.9 kg   SpO2 100%   BMI 36.49 kg/m   Physical Exam Vitals and nursing note reviewed.  Constitutional:      Appearance: He is well-developed. He is obese. He is not ill-appearing.  HENT:     Head: Normocephalic and atraumatic.  Eyes:     Pupils: Pupils are equal, round, and reactive to light.  Cardiovascular:     Rate and Rhythm: Normal rate and regular rhythm.  Pulmonary:     Effort: Pulmonary effort is normal. No respiratory distress.  Abdominal:     Palpations: Abdomen is soft.     Tenderness: There is no abdominal tenderness.  Musculoskeletal:     Cervical back: Neck supple.     Comments: Tenderness to palpation right great toe without warmth or erythema, there is some mild swelling over the dorsum of the foot, 2+ DP pulse  Lymphadenopathy:     Cervical: No cervical adenopathy.  Skin:    General: Skin is warm and dry.  Neurological:     Mental Status: He is alert and oriented to  person, place, and time.  Psychiatric:        Mood and Affect: Mood normal.     (all labs ordered are listed, but only abnormal results are displayed) Labs Reviewed  CBC - Abnormal; Notable for the following components:      Result Value   RDW 17.4 (*)    Platelets 130 (*)    All other components within normal limits  D-DIMER, QUANTITATIVE - Abnormal; Notable for the following components:   D-Dimer, Quant 0.90 (*)    All other components within normal limits  BASIC METABOLIC PANEL WITH GFR  URIC ACID    EKG: None  Radiology: DG Foot 2 Views Right Result Date: 06/15/2024 EXAM: 1 or 2 VIEW(S) XRAY OF THE RIGHT FOOT 06/15/2024 07:11:00 PM COMPARISON: None available. CLINICAL HISTORY: foot pain. Right foot pain and swelling. FINDINGS: BONES AND  JOINTS: No acute fracture. No focal osseous lesion. No joint dislocation. There are mild degenerative changes at the first metatarsophalangeal joint. SOFT TISSUES: There is soft tissue swelling of the anterior foot. IMPRESSION: 1. Soft tissue swelling of the anterior foot. 2. Mild degenerative changes at the first metatarsophalangeal joint. Electronically signed by: Greig Pique MD 06/15/2024 07:40 PM EDT RP Workstation: [id number]    {Document cardiac monitor, telemetry assessment procedure when appropriate:32947} Procedures   Medications Ordered in the ED  ketorolac (TORADOL) 30 MG/ML injection 30 mg (has no administration in time range)      {Click here for ABCD2, HEART and other calculators REFRESH Note before signing:1}                              Medical Decision Making Amount and/or Complexity of Data Reviewed Labs: ordered.  Risk Prescription drug management.   ***  {Document critical care time when appropriate  Document review of labs and clinical decision tools ie CHADS2VASC2, etc  Document your independent review of radiology images and any outside records  Document your discussion with family members, caretakers and with consultants  Document social determinants of health affecting pt's care  Document your decision making why or why not admission, treatments were needed:32947:::1}   Final diagnoses:  None    ED Discharge Orders     None

## 2024-06-15 NOTE — ED Notes (Signed)
 Patient transported to X-ray

## 2024-06-15 NOTE — ED Provider Triage Note (Addendum)
 Emergency Medicine Provider Triage Evaluation Note  Bradley [name] , a 73 y.o. male  was evaluated in triage.  Pt complains of right foot pain and swelling.  Symptoms started Friday.  No aggravating factors.  Pain to the medial dorsal aspect of foot near base of big toe.  But swelling up to mid ankle.  It hurts to walk  Review of Systems  Positive: Swelling Negative: Fevers chills chest pain shortness of breath  Physical Exam  BP (!) 140/78 (BP Location: Left Arm)   Pulse 70   Temp 97.9 F (36.6 C) (Oral)   Resp 20   Ht 5' 8 (1.727 m)   Wt 108.9 kg   SpO2 100%   BMI 36.49 kg/m  Gen:   Awake, no distress   Resp:  Normal effort  MSK:   Moves extremities without difficulty  Other:  Right lower extremity with edema.  No erythema crepitus.  2+ DP pulses bilaterally.  Soft compartments.  Medical Decision Making  Medically screening exam initiated at 5:33 PM.  Appropriate orders placed.  Bradley [name] was informed that the remainder of the evaluation will be completed by another provider, this initial triage assessment does not replace that evaluation, and the importance of remaining in the ED until their evaluation is complete.  73 year old with unilateral foot/swelling.  Gout?  Swelling does extend up into his calf.  Will get x-ray,  Will also get screening labs. Unfortunately, do not have US  at this time. Well get D-dimer    Neysa Caron PARAS, DO 06/15/24 1733    Neysa Caron PARAS, DO 06/15/24 1736

## 2024-06-15 NOTE — ED Notes (Signed)
 Charge RN notified this RN that vascular is here until 5pm. MD notified. Orders updated.

## 2024-06-15 NOTE — ED Triage Notes (Addendum)
 Patient arrives via [facility] from home for right foot swelling, no injury or hx of chf. Dorsal side of r foot. Hurts with walking, started Saturday and pain has increased since. PMS intact.  EMS vitals 170/82 HR 77 RR 18 93 on room air CBG 113  GCS 15

## 2024-06-16 LAB — URIC ACID: Uric Acid, Serum: 10.9 mg/dL — ABNORMAL HIGH (ref 3.7–8.6)

## 2024-06-16 MED ORDER — METHYLPREDNISOLONE 4 MG PO TBPK: ORAL_TABLET | ORAL | 0 refills | Status: DC

## 2024-06-16 NOTE — Discharge Instructions (Signed)
 You were seen today for foot and toe pain.  You likely have an inflammatory arthritis known as gout.  Avoid pork products and alcohol.  Take medications as prescribed.  Return later today also for ultrasound to ensure that you do not have any blood clots.

## 2024-06-17 ENCOUNTER — Ambulatory Visit (HOSPITAL_COMMUNITY)
Admission: RE | Admit: 2024-06-17 | Discharge: 2024-06-17 | Disposition: A | Discharge status: Home / Self Care | Source: Ambulatory Visit | Attending: Emergency Medicine | Admitting: Emergency Medicine

## 2024-06-17 DIAGNOSIS — M7989 Other specified soft tissue disorders: Secondary | ICD-10-CM

## 2024-06-17 DIAGNOSIS — M79604 Pain in right leg: Secondary | ICD-10-CM | POA: Diagnosis present

## 2024-07-05 ENCOUNTER — Other Ambulatory Visit: Payer: Self-pay | Admitting: Physician Assistant

## 2024-09-24 ENCOUNTER — Encounter (HOSPITAL_COMMUNITY): Payer: Self-pay | Admitting: *Deleted

## 2024-09-24 ENCOUNTER — Other Ambulatory Visit: Payer: Self-pay

## 2024-09-24 ENCOUNTER — Emergency Department (HOSPITAL_COMMUNITY)

## 2024-09-24 ENCOUNTER — Emergency Department (HOSPITAL_COMMUNITY)
Admission: EM | Admit: 2024-09-24 | Discharge: 2024-09-24 | Disposition: A | Discharge status: Home / Self Care | Source: Home / Self Care | Attending: Emergency Medicine | Admitting: Emergency Medicine

## 2024-09-24 DIAGNOSIS — M25562 Pain in left knee: Secondary | ICD-10-CM

## 2024-09-24 MED ORDER — MELOXICAM 15 MG PO TABS: 15.0000 mg | ORAL_TABLET | Freq: Every day | ORAL | 0 refills | Status: AC

## 2024-09-24 NOTE — ED Triage Notes (Signed)
 BIB [facility] from home for L knee pain, onset yesterday. Denies fall or injury, gout or arthritis. Endorses pain and swelling. VSS. CBG 129. Alert, NAD, calm, interactive.

## 2024-09-24 NOTE — Discharge Instructions (Addendum)
 Begin taking Mobic  as prescribed for knee pain.  May wear Ace wrap on left knee to help with pain/swelling.  Please follow-up with primary care or orthopedics in the next 1 to 2 weeks for further evaluation of continued knee pain.  Return to ED if any symptoms worsen including severe swelling/color changes to the leg or inability to walk.

## 2024-09-24 NOTE — ED Provider Notes (Signed)
 " [place] [facility] DEPARTMENT AT [facility] Provider Note   CSN: [id number] Arrival date & time: 09/24/24  9267     Patient presents with: Knee Pain   Bradley [name] is a 74 y.o. male.  Patient is a 74 year old male with a history of bipolar disorder, hyperlipidemia, hypertension who presents to the ED for left knee pain that began yesterday.  Notes pain on the front of the knee and worse with walking.  Denies fall or injury.  Has not taken any medications.  Denies any previous injuries to the knee or surgery.  States he has never had any issues with his knee previously.  No further complaints.    Knee Pain      Prior to Admission medications  Medication Sig Start Date End Date Taking? Authorizing Provider  meloxicam  (MOBIC ) 15 MG tablet Take 1 tablet (15 mg total) by mouth daily. 09/24/24  Yes Neysa Thersia RAMAN, PA-C  albuterol  (VENTOLIN  HFA) 108 (90 Base) MCG/ACT inhaler Inhale 2 puffs into the lungs every 6 (six) hours as needed for wheezing or shortness of breath. 11/04/23   [provider]  amLODipine  (NORVASC ) 10 MG tablet Take 10 mg by mouth at bedtime. 10/14/23   [provider]  divalproex  (DEPAKOTE  ER) 500 MG 24 hr tablet Take 1,000 mg by mouth at bedtime.    [provider]  hydrochlorothiazide  (HYDRODIURIL ) 50 MG tablet Take 50 mg by mouth at bedtime.    [provider]  lisinopril  (ZESTRIL ) 40 MG tablet Take 40 mg by mouth at bedtime. 07/22/23   [provider]  pravastatin  (PRAVACHOL ) 40 MG tablet TAKE 1 TABLET(40 MG) BY MOUTH EVERY EVENING 06/12/24   Weaver, Scott T, PA-C  risperiDONE  (RISPERDAL ) 2 MG tablet Take 2 mg by mouth at bedtime. 11/08/23   [provider]  traMADol  (ULTRAM ) 50 MG tablet Take 1-2 tablets (50-100 mg total) by mouth every 6 (six) hours as needed for moderate pain (pain score 4-6) or severe pain (pain score 7-10). 12/27/23   Sheldon Standing, MD    Allergies: Patient has no known  allergies.    Review of Systems  Musculoskeletal:  Positive for arthralgias.  All other systems reviewed and are negative.   Updated Vital Signs BP (!) 163/103 (BP Location: Right Arm)   Pulse 65   Temp 97.9 F (36.6 C)   Resp 18   Wt 108.9 kg   SpO2 90%   BMI 36.49 kg/m   Physical Exam Cardiovascular:     Pulses: Normal pulses.     Comments: PT pulse 2+ bilaterally Musculoskeletal:     Comments: Full range of motion of the knee with pain on flexion/extension.  Tender palpation over the patellar tendon area on the anterior left knee.  No obvious deformities, erythema, edema, wounds.  No posterior tenderness.  No pain on valgus or varus stress.  Skin:    General: Skin is warm and dry.  Neurological:     Mental Status: He is alert and oriented to person, place, and time.  Psychiatric:        Mood and Affect: Mood normal.        Behavior: Behavior normal.     (all labs ordered are listed, but only abnormal results are displayed) Labs Reviewed - No data to display  EKG: None  Radiology: DG Knee Complete 4 Views Left Result Date: 09/24/2024 EXAM: 4 VIEW(S) XRAY OF THE LEFT KNEE 09/24/2024 10:06:00 AM COMPARISON: None available. CLINICAL HISTORY: 74 year old male.  Pain, unclear onset, elderly. FINDINGS: BONES AND JOINTS: No acute fracture. No malalignment. Mild tricompartmental joint space loss. No joint effusion. SOFT TISSUES: Popliteal arteriosclerosis noted. Rounded thirteen millimeter Dystrophic calcification of the distal left quadriceps muscle is nonspecific but might be sequelae of remote trauma. IMPRESSION: 1. No acute osseous abnormality identified about the left knee. Electronically signed by: Helayne Hurst MD 09/24/2024 10:37 AM EST RP Workstation: [id number]      Medications Ordered in the ED - No data to display                               Medical Decision Making Patient is a 74 year old male who presents to the ED for left knee pain that began yesterday.   Denies fall or injury.  Please see detailed HPI above.  On exam patient is alert and well-appearing.  Physical exam as noted above.  He is ambulatory with a slight limp.  Tenderness noted over the left anterior knee.  X-ray of the left knee reviewed that shows mild osteoarthritis but otherwise no acute abnormalities.  Differential includes arthritis, tendinitis, ACL injury, meniscus injury, bursitis, septic knee.  Less concerns for infectious processes.  Afebrile and no acute signs on exam.  Less concerns for acute DVT as pain is all anterior and no acute edema or erythema noted.  As he is ambulatory and neurovascularly intact, stable for discharge home.  Prescribed Mobic .  Symptomatic care discussed.  Advised PCP or orthopedic follow-up in the next 1 to 2 weeks for any continued pain.  Return precautions provided.   Amount and/or Complexity of Data Reviewed Radiology: ordered.  Risk Prescription drug management.       Final diagnoses:  Acute pain of left knee    ED Discharge Orders          Ordered    meloxicam  (MOBIC ) 15 MG tablet  Daily        09/24/24 1424               Neysa Thersia RAMAN, PA-C 09/24/24 1442  "

## 2024-09-24 NOTE — ED Provider Triage Note (Signed)
 Emergency Medicine Provider Triage Evaluation Note  Bradley [name] , a 74 y.o. male  was evaluated in triage.  Pt complains of left knee pain 1 day no fall, no trauma, no obvious precipitant.  No other complaints.  Review of Systems  Positive: Knee pain Negative: Weakness  Physical Exam  BP (!) 151/74 (BP Location: Right Arm)   Pulse (!) 59   Temp 99 F (37.2 C) (Oral)   Resp 16   Wt 108.9 kg   SpO2 95%   BMI 36.49 kg/m  Gen:   Awake, no distress elderly male speaking clearly Resp:  Normal effort  MSK:   Moves extremities without difficulty patient flexes and extends the knee spontaneously, describes pain with doing so no obvious deformity Other:  Neuro unremarkable  Medical Decision Making  Medically screening exam initiated at 9:35 AM.  Appropriate orders placed.  Bradley [name] was informed that the remainder of the evaluation will be completed by another provider, this initial triage assessment does not replace that evaluation, and the importance of remaining in the ED until their evaluation is complete.   Bradley Charleston, MD 09/24/24 [phone]
